# Patient Record
Sex: Male | Born: 1935 | Race: White | Hispanic: No | Smoking: Never smoker
Health system: Southern US, Community
[De-identification: ages and names within clinical notes are randomized; demographics above are authoritative.]

## PROBLEM LIST (undated history)

## (undated) DIAGNOSIS — E079 Disorder of thyroid, unspecified: Secondary | ICD-10-CM

## (undated) DIAGNOSIS — E119 Type 2 diabetes mellitus without complications: Secondary | ICD-10-CM

## (undated) DIAGNOSIS — E039 Hypothyroidism, unspecified: Secondary | ICD-10-CM

## (undated) DIAGNOSIS — E785 Hyperlipidemia, unspecified: Secondary | ICD-10-CM

## (undated) DIAGNOSIS — I10 Essential (primary) hypertension: Secondary | ICD-10-CM

## (undated) DIAGNOSIS — I350 Nonrheumatic aortic (valve) stenosis: Secondary | ICD-10-CM

## (undated) DIAGNOSIS — I2510 Atherosclerotic heart disease of native coronary artery without angina pectoris: Secondary | ICD-10-CM

## (undated) DIAGNOSIS — G459 Transient cerebral ischemic attack, unspecified: Secondary | ICD-10-CM

## (undated) HISTORY — PX: HERNIA REPAIR: SHX51

## (undated) HISTORY — DX: Hypothyroidism, unspecified: E03.9

## (undated) HISTORY — PX: EYE SURGERY: SHX253

## (undated) HISTORY — PX: APPENDECTOMY: SHX54

## (undated) HISTORY — PX: KNEE SURGERY: SHX244

## (undated) HISTORY — DX: Essential (primary) hypertension: I10

## (undated) HISTORY — DX: Transient cerebral ischemic attack, unspecified: G45.9

## (undated) HISTORY — DX: Nonrheumatic aortic (valve) stenosis: I35.0

## (undated) HISTORY — DX: Hyperlipidemia, unspecified: E78.5

## (undated) HISTORY — DX: Disorder of thyroid, unspecified: E07.9

## (undated) HISTORY — PX: COLONOSCOPY: SHX174

## (undated) HISTORY — DX: Atherosclerotic heart disease of native coronary artery without angina pectoris: I25.10

## (undated) HISTORY — PX: OTHER SURGICAL HISTORY: SHX169

## (undated) HISTORY — DX: Type 2 diabetes mellitus without complications: E11.9

---

## 2001-01-22 ENCOUNTER — Encounter: Payer: Self-pay | Admitting: Cardiology

## 2001-01-22 ENCOUNTER — Ambulatory Visit (HOSPITAL_COMMUNITY): Admission: RE | Admit: 2001-01-22 | Discharge: 2001-01-23 | Payer: Self-pay | Admitting: Cardiology

## 2007-01-24 HISTORY — PX: CORONARY ANGIOPLASTY: SHX604

## 2007-04-01 ENCOUNTER — Ambulatory Visit: Payer: Self-pay | Admitting: Urology

## 2007-04-07 ENCOUNTER — Ambulatory Visit: Payer: Self-pay | Admitting: Urology

## 2007-05-05 ENCOUNTER — Ambulatory Visit: Payer: Self-pay

## 2007-05-07 ENCOUNTER — Encounter: Payer: Self-pay | Admitting: Internal Medicine

## 2007-06-05 ENCOUNTER — Encounter: Payer: Self-pay | Admitting: Internal Medicine

## 2007-06-21 ENCOUNTER — Emergency Department: Payer: Self-pay | Admitting: Internal Medicine

## 2007-06-30 ENCOUNTER — Ambulatory Visit: Payer: Self-pay | Admitting: General Practice

## 2007-07-11 ENCOUNTER — Ambulatory Visit: Payer: Self-pay | Admitting: General Practice

## 2007-08-06 ENCOUNTER — Emergency Department: Payer: Self-pay | Admitting: Emergency Medicine

## 2007-08-12 ENCOUNTER — Ambulatory Visit: Payer: Self-pay | Admitting: Urology

## 2007-08-12 ENCOUNTER — Other Ambulatory Visit: Payer: Self-pay

## 2007-08-14 ENCOUNTER — Ambulatory Visit: Payer: Self-pay | Admitting: Urology

## 2007-08-19 ENCOUNTER — Other Ambulatory Visit: Payer: Self-pay

## 2007-08-19 ENCOUNTER — Emergency Department: Payer: Self-pay | Admitting: Internal Medicine

## 2007-11-17 ENCOUNTER — Ambulatory Visit: Payer: Self-pay | Admitting: Urology

## 2007-11-17 ENCOUNTER — Other Ambulatory Visit: Payer: Self-pay

## 2007-11-20 ENCOUNTER — Ambulatory Visit: Payer: Self-pay | Admitting: Urology

## 2008-02-19 ENCOUNTER — Encounter: Payer: Self-pay | Admitting: Neurology

## 2008-06-04 HISTORY — PX: CARDIAC CATHETERIZATION: SHX172

## 2008-09-29 ENCOUNTER — Ambulatory Visit (HOSPITAL_COMMUNITY): Admission: RE | Admit: 2008-09-29 | Discharge: 2008-09-29 | Payer: Self-pay | Admitting: Cardiovascular Disease

## 2009-07-28 ENCOUNTER — Ambulatory Visit: Payer: Self-pay | Admitting: Family Medicine

## 2010-09-13 LAB — POCT I-STAT 3, ART BLOOD GAS (G3+)
Bicarbonate: 24.8 mEq/L — ABNORMAL HIGH (ref 20.0–24.0)
O2 Saturation: 85 %
TCO2: 26 mmol/L (ref 0–100)
pCO2 arterial: 38.6 mmHg (ref 35.0–45.0)
pH, Arterial: 7.416 (ref 7.350–7.450)
pO2, Arterial: 50 mmHg — ABNORMAL LOW (ref 80.0–100.0)

## 2010-09-13 LAB — POCT I-STAT 3, VENOUS BLOOD GAS (G3P V)
Acid-base deficit: 1 mmol/L (ref 0.0–2.0)
Bicarbonate: 24.6 mEq/L — ABNORMAL HIGH (ref 20.0–24.0)
O2 Saturation: 52 %
TCO2: 26 mmol/L (ref 0–100)
pCO2, Ven: 43.2 mmHg — ABNORMAL LOW (ref 45.0–50.0)
pH, Ven: 7.363 — ABNORMAL HIGH (ref 7.250–7.300)
pO2, Ven: 29 mmHg — CL (ref 30.0–45.0)

## 2010-09-13 LAB — PROTIME-INR
INR: 1.1 (ref 0.00–1.49)
Prothrombin Time: 14.6 seconds (ref 11.6–15.2)

## 2010-09-13 LAB — GLUCOSE, CAPILLARY
Glucose-Capillary: 118 mg/dL — ABNORMAL HIGH (ref 70–99)
Glucose-Capillary: 130 mg/dL — ABNORMAL HIGH (ref 70–99)

## 2010-10-17 NOTE — Cardiovascular Report (Signed)
NAMEWITTEN, CERTAIN               ACCOUNT NO.:  [id number]   MEDICAL RECORD NO.:  [id number]          PATIENT TYPE:  OIB   LOCATION:  2899                         FACILITY:  [facility]   PHYSICIAN:  Antonieta Iba, MD   DATE OF BIRTH:  1924-08-09   DATE OF PROCEDURE:  DATE OF DISCHARGE:  09/29/2008                            CARDIAC CATHETERIZATION   REFERRING PHYSICIAN:  Madaline Savage, MD   REASON FOR PROCEDURE:  Mr. Frizell is a very pleasant 77 year old  gentleman with a history of coronary artery disease, stent placed to his  mid circumflex in 2002 who presents with worsening shortness of breath.   Risks and benefits were discussed with the patient and consent was  obtained.  The patient was prepped and draped in usual sterile fashion  and a modified Seldinger technique was used to engage the right femoral  artery and a 5-French catheter and sheath was placed.  A 7-French  catheter and sheath were inserted into the right femoral vein.  Swan  catheter was used to obtain right heart pressures, wedge, and pulmonary  artery saturations and cardiac output and index were determined.  After  right heart pressures, Judkins JR-5 catheters were used to engage the  left main and ostium of the RCA respectively.  Hand injection of  contrast was used to visualize the coronary anatomy and cinematography  was recorded.  A pigtail catheter was used to cross the aortic valve and  LV gram also recorded with pullback recording aortic valve gradient.  No  complications were noted during this case.  Less than 100 mL of contrast  was used.   Coronary anatomy;  Left main; large size vessel that bifurcates into the LAD and left  circumflex.  There is no significant disease noted.   LAD; moderate-to-large size vessel that extends to the apex.  There is  one moderate-sized diagonal branch.  The LAD has a long diffuse region  of mild to moderate disease estimated at 30-40% after the diagonal  takeoff  extending to the mid-LAD.  The diagonal branch has no  significant disease.   Left circumflex; left circumflex is a moderate-sized vessel with several  obtuse marginal branches.  The stent in the mid left circumflex is  patent.  There is a 50-60% lesion noted in the ostial OM1.  Otherwise,  there is no significant disease noted.   Right coronary artery; dominant vessel that is large in size that  bifurcates distally with an PD and PL branch.  No significant disease  noted.   Ejection fraction/LV gram estimated at 45% with mild global hypokinesis.   Right heart pressures; right atrial pressure with mean of 2, RV pressure  22/3 with a mean of 5, PA pressures 24/6 with a mean of 14, wedge  pressure of 7, aortic root pressure of 110/60, LV pressure of 109/3 with  an EDP of 7.  The aortic saturation of 85% and PA saturation of 52%.   In summary; mild to moderate mid LAD disease estimated at 30-40% in a  long diffuse region.  No in-stent restenosis of the stent  in the left  circumflex.  There is moderate disease of the ostial OM1.  Ejection  fraction of 45% with mild global hypokinesis which is unchanged from  2002.  Normal right heart pressures.  Overall, there is only mild  changes from previous catheterization in 2002.  We have suggested  medical management of his coronary artery  disease and we would refer to Dr. Elsie Lincoln for management of this.  Etiology of his shortness of breath is uncertain though likely not due  to elevated right heart pressures or to worsening of his coronary artery  disease.  Again, we will defer to Dr. Elsie Lincoln for further evaluation.      Antonieta Iba, MD  Electronically Signed     TJG/MEDQ  D:  09/29/2008  T:  09/29/2008  Job:  [id number]

## 2010-10-20 NOTE — Cardiovascular Report (Signed)
[name]. [facility]  Patient:    [name], [name] Visit Number: [id number] MRN: [id number]          Service Type: CAT Location: 6500 [id number] Attending Physician:  Ophelia Shoulder Proc. Date: 01/22/01 Adm. Date:  01/22/2001   CC:         Cardiac Catheterization Laboratory  Dr. Shella Spearing, [facility]   Cardiac Catheterization  PROCEDURES PERFORMED: 1. Selective coronary angiography by Judkins technique. 2. Retrograde left heart catheterization. 3. Left ventricular angiography. 4. Abdominal aortography. 5. Percutaneous coronary stenting of the mid left circumflex    coronary artery.  COMPLICATIONS:  None.  ENTRY SITE:  Right femoral.  DYE USED:  Omnipaque.  PATIENT PROFILE:  The patient is a 77 year old, active, gentleman who has been recently having episodes of chest discomfort awakening him from sleep at night.  He has also had some pains in his neck.  He was seen in our office recently with these complaints with a normal ECG and a subsequent Cardiolite stress test showed lateral wall ischemia but no evidence of scar.  Dynamic imaging did not allow for quantitation of his ejection fraction.  Todays procedure was performed on an outpatient basis electively.  RESULTS:  PRESSURES:  The left ventricular pressure was 145/15, central aortic pressure 145/82, mean of 105.  No aortic valve gradient by pullback technique.  ANGIOGRAPHIC RESULTS:  The left main coronary artery was normal.  The left circumflex coronary artery gave rise to a large obtuse marginal branch which was normal.  Shortly thereafter, the circumflex in its midportion contained an eccentric stenosis about 10 mm long and 90-95% severe.  The circumflex terminated as a bifurcating second obtuse marginal branch.  The LAD gave rise to a diagonal branch and neither the LAD nor its diagonal branch were diseased.  The right coronary artery was a large dominant vessel that  contained luminal irregularities but no significant lesions.  There was a faint collateral vessel extending from the posterolateral vessel to the distal circumflex.  The left ventricle unfortunately showed a lot of ventricular ectopy when angiogram was performed.  Estimated ejection fraction was 50%.  There were no significant wall motion abnormalities and no mitral regurgitation.  PERCUTANEOUS INTERVENTION:  This was performed from the right leg through the 6 French indwelling arterial sheath left from diagnostic cardiac catheterization.  The patient was systemically heparinized with 5000 units of heparin.  A subsequently ACT was approximately 265.  He was also given a double bolus of Integrilin.  The guide catheter used was a 6 [place] CLS-4 guide catheter.  The wire was a short Patriot wire.  The first interventional device used was a 2.75 x 15 mm Cutting Balloon which was difficult to cross the lesion with.  Once it crossed, the balloon was inflated to 6 atmospheres of pressure and a good result was obtained but there was felt to be an eccentric residual plaque and possibly some dissection. I then used a 3.0 x 15 mm Maverick balloon inflated to 8 atmospheres of pressure.  This smoothed the dissection but did not eradicate it.  I then chose a 2.75 x 13 mm Guidant Penta stent and inflated it to 14 atmospheres of pressure corresponding to anticipated luminal diameter of 3.08.  TIMI-3 class flow was preserved and the lesion was reduced from 90-95% down to 0% residual.  The case was tolerated well.  ABDOMINAL AORTOGRAM:  Abdominal aortography had shown a smooth aorta, normal iliacs and normal renal arteries.  FINAL  DIAGNOSES: 1. Single-vessel coronary artery disease of the mid left circumflex    coronary artery. 2. Successful percutaneous intervention with balloon angioplasty and    stenting with reduction of a 95% lesion down to 0% residual. 3. Normal left ventricular function. 4.  Normal renal artery stenosis, abdominal aorta and common iliacs.  PLAN:  The patient will continue on Integrilin overnight and be discharged tomorrow if enzymes are negative.  The patients lipid profile at baseline shows an LDL of 65, a total cholesterol of 140 and an HDL in the high 30s. Attending Physician:  Ophelia Shoulder DD:  01/22/01 TD:  01/23/01 Job: [id number]

## 2010-10-20 NOTE — Discharge Summary (Signed)
[place]. [facility]  Patient:    JANOS, SHAMPINE Visit Number: [id number] MRN: [id number]          Service Type: CAT Location: 6500 6523 01 Attending Physician:  Ophelia Shoulder Dictated by:   Tara Jernejcic, P.A. Adm. Date:  01/22/2001 Disc. Date: 01/23/2001   CC:         Lavetta Nielsen, M.D.   Discharge Summary  DATE OF BIRTH:  06-May-1925  ADMITTING DIAGNOSES: 1. Abnormal Cardiolite with lateral wall ischemia. 2. Hypertension. 3. Hyperlipidemia. 4. No prior history of coronary artery disease.  DISCHARGE DIAGNOSES: 1. Abnormal Cardiolite, status post cardiac catheterization electively    revealing single-vessel circumflex disease, status post PCI. 2. Hypertension. 3. Hyperlipidemia.  HISTORY OF PRESENT ILLNESS:  Mr. Quinley is a pleasant, 77 year old male patient of Madaline Savage, M.D. and Dr. Jarrett Soho, who has been followed for hypertension and hyperlipidemia in the past.  He has no prior history of coronary artery disease.  Recently the patient has been complaining of nocturnal chest pain which is not provoked by exertion and is atypical in nature.  EKG done in the office December 23, 2000, showed normal sinus rhythm with nonspecific ST-T wave abnormalities. Based on the patients age, sex, hyperlipidemia, and hypertension, it was felt that he had multiple cardiac risk factors in the setting of atypical chest pain and a Persantine Cardiolite was ordered.  This was performed January 10, 2001, and revealed evidence of possible inferior wall scar towards the base with mild lateral wall ischemia noted primarily on horizontal long axis images.  Gating was not performed.  Because this patient has no prior history of coronary artery disease and has multiple risk factors, it was felt that cardiac catheterization was warranted to give definitive diagnosis.  PROCEDURES:  Cardiac catheterization with intervention January 22, 2001,  by Madaline Savage, M.D.  Single-vessel circumflex disease.  COMPLICATIONS:  None.  CONSULTATIONS:  None.  HOSPITAL COURSE:  Mr. Black was admitted to [facility] on January 22, 2001, for elective cardiac catheterization.  Preprocedure laboratory studies were within normal limits (BUN 18, creatinine 1.0).  Of note his fasting total cholesterol on January 21, 2001, showed a total cholesterol 143, triglycerides 312, HDL 32, and LDL 48.  The patient was screened for placement of study, but declined to participate. The patient was taken to cardiac catheterization lab by Madaline Savage, M.D.  Cardiac catheterization revealed a normal left main, normal LAD, circumflex with 90% lesion in its proximal midportion after the OM-1.  OM-1 and OM-2 were widely patent.  RCA was okay and dominant.  Ejection fraction 50%.  Dr. Elsie Lincoln proceeded with PENTA stenting of the circumflex lesion reducing 90% to 0% stenosis.  The patient was treated with integrilin and heparin as well as Plavix.  The patient tolerated the procedure well and had no problems with sheath pull.  On the morning after the procedure the patients potassium was low at 3.3 and this was repleted.  Cardiac enzymes nonspecific with a mild troponin lead of 0.08, but felt to be unlikely ischemic, with CK of 57 and MB 1.0.  THe patient was felt to be able to be discharged home on January 23, 2001.  He will be treated with aggressive medical therapy including statin and ACE inhibitor medications, Plavix, and aspirin.  DISCHARGE MEDICATIONS: 1. Lipitor 10 mg q.h.s. 2. Plavix 75 mg q.d. for a month. 3. Zestril 5 mg q.d. 4. Detrol 4 mg at  night. 5. Hytrin 5 mg. 6. Hydrochlorothiazide 25 mg q.d. 7. Niacin 500 mg 8. Enteric coated aspirin 325 mg q.d. 9. Nitroglycerin as needed for chest pain.  ACTIVITY:  No strenuous activity, lifting no more than five pounds, driving, or sexual activity for two days.  DIET:  The patient  may follow a low-fat/low-cholesterol/low-salt diet and needs to reduce _____ products and carbohydrates in his diet.  WOUND CARE:  He is to keep his Perclose bandage on for two days and then may replace this with regular Band-Aids.  He is not to soak in the tub or swim for a week.  INSTRUCTIONS:  He is ask to call the office with any problems or questions.  FOLLOWUP:  Followup scheduled with Dr. Reino Kent nurse practitioner, Vernona Rieger, for February 18, 2001, at 10 oclock. Dictated by:   Marya Fossa, P.A. Attending Physician:  Ophelia Shoulder DD:  01/23/01 TD:  01/24/01 Job: [id number]

## 2010-11-29 ENCOUNTER — Encounter: Payer: Self-pay | Admitting: Neurology

## 2010-12-03 ENCOUNTER — Encounter: Payer: Self-pay | Admitting: Neurology

## 2011-01-03 ENCOUNTER — Encounter: Payer: Self-pay | Admitting: Neurology

## 2011-06-30 ENCOUNTER — Emergency Department: Payer: Self-pay | Admitting: Emergency Medicine

## 2011-07-17 ENCOUNTER — Ambulatory Visit: Payer: Self-pay | Admitting: Cardiovascular Disease

## 2012-01-11 ENCOUNTER — Ambulatory Visit: Payer: Self-pay | Admitting: Unknown Physician Specialty

## 2012-09-12 ENCOUNTER — Encounter: Payer: Self-pay | Admitting: Neurology

## 2012-09-26 ENCOUNTER — Ambulatory Visit: Payer: Self-pay | Admitting: Cardiovascular Disease

## 2012-10-02 ENCOUNTER — Encounter: Payer: Self-pay | Admitting: Neurology

## 2012-10-17 ENCOUNTER — Other Ambulatory Visit: Payer: Self-pay | Admitting: *Deleted

## 2012-10-17 MED ORDER — METOLAZONE 2.5 MG PO TABS: 2.5000 mg | ORAL_TABLET | Freq: Every day | ORAL | Status: DC

## 2012-10-21 ENCOUNTER — Other Ambulatory Visit: Payer: Self-pay | Admitting: Cardiovascular Disease

## 2012-10-21 LAB — COMPREHENSIVE METABOLIC PANEL
ALT: 9 U/L (ref 0–53)
AST: 15 U/L (ref 0–37)
Albumin: 3.6 g/dL (ref 3.5–5.2)
Alkaline Phosphatase: 108 U/L (ref 39–117)
Glucose, Bld: 130 mg/dL — ABNORMAL HIGH (ref 70–99)
Potassium: 4.3 mEq/L (ref 3.5–5.3)
Sodium: 143 mEq/L (ref 135–145)
Total Bilirubin: 0.6 mg/dL (ref 0.3–1.2)
Total Protein: 5.6 g/dL — ABNORMAL LOW (ref 6.0–8.3)

## 2012-10-29 ENCOUNTER — Encounter: Payer: Self-pay | Admitting: Cardiovascular Disease

## 2012-11-02 ENCOUNTER — Encounter: Payer: Self-pay | Admitting: Neurology

## 2012-11-05 ENCOUNTER — Other Ambulatory Visit: Payer: Self-pay | Admitting: Pharmacist Clinician (PhC)/ Clinical Pharmacy Specialist

## 2012-11-05 MED ORDER — FUROSEMIDE 20 MG PO TABS: 40.0000 mg | ORAL_TABLET | Freq: Every day | ORAL | Status: DC

## 2012-11-19 ENCOUNTER — Telehealth: Payer: Self-pay | Admitting: Cardiovascular Disease

## 2012-11-19 NOTE — Telephone Encounter (Signed)
Returned call.  Left message to call back before 4pm.  

## 2012-11-19 NOTE — Telephone Encounter (Signed)
Chart# [id number]

## 2012-11-19 NOTE — Telephone Encounter (Signed)
Dr. Alanda Amass aware of pt. Issues and agrees with plan

## 2012-11-19 NOTE — Telephone Encounter (Signed)
Returned call.  Pt stated his BP was 99/53 at therapy and he was concerned.  Stated when he woke up this morning he couldn't see out of his R eye at all.  Stated after a while it got straight, but it's still a little foggy.  Stated he can see perfect out of his L eye and his R eye is foggy.  Pt does not have a way to check his BP at home.  Stated his BP runs 120s/60-80.  Denied HA or dizziness this morning when he woke up this morning.  Stated last eye exam was 2 months ago and he is supposed to go back on July 30th.  Pt denied checking BG today and stated he has been eating sweets.    Advice: Check fasting BG every morning   Contact eye doctor and inform of vision changes   Dr. Alanda Amass will be notified as well  Pt verbalized understanding and agreed w/ plan.  Pt stated he will go up to the pharmacy to check his BP and call back in the morning.  Pt understands Dr. Alanda Amass will be notified as well.  Message forwarded to [facility]. Berlinda Last, LPN to discuss w/ Dr. Alanda Amass.  This note and paper chart#  placed on Dr. Kandis Cocking cart.

## 2012-11-19 NOTE — Telephone Encounter (Signed)
Woke up this morning -could not see-went to therapy and they said his BP was 99/53-told him to call his Cardiologist-please call him after 1:00 if you cant call befoer 11:45!

## 2012-11-19 NOTE — Telephone Encounter (Signed)
Returning your call. °

## 2012-11-20 ENCOUNTER — Ambulatory Visit: Payer: Self-pay | Admitting: Ophthalmology

## 2012-11-20 LAB — CREATININE, SERUM
Creatinine: 1.32 mg/dL — ABNORMAL HIGH (ref 0.60–1.30)
EGFR (African American): 57 — ABNORMAL LOW

## 2012-12-02 ENCOUNTER — Encounter: Payer: Self-pay | Admitting: Neurology

## 2013-01-02 ENCOUNTER — Encounter: Payer: Self-pay | Admitting: Neurology

## 2013-02-02 ENCOUNTER — Encounter: Payer: Self-pay | Admitting: Neurology

## 2013-02-12 DIAGNOSIS — N138 Other obstructive and reflux uropathy: Secondary | ICD-10-CM | POA: Insufficient documentation

## 2013-02-12 DIAGNOSIS — R350 Frequency of micturition: Secondary | ICD-10-CM | POA: Insufficient documentation

## 2013-03-04 ENCOUNTER — Encounter: Payer: Self-pay | Admitting: Neurology

## 2013-03-17 ENCOUNTER — Telehealth: Payer: Self-pay | Admitting: Cardiovascular Disease

## 2013-03-17 NOTE — Telephone Encounter (Signed)
Message forwarded to J.C. Wildman, LPN.  

## 2013-03-17 NOTE — Telephone Encounter (Signed)
Pt called and informed to call schedulers for appt. With either Dr. Rennis Golden or Dr. Herbie Baltimore. Pt. Stated understanding of instructions

## 2013-03-17 NOTE — Telephone Encounter (Signed)
Please have [name] to call him.Just need to talk to him.

## 2013-03-27 ENCOUNTER — Encounter: Payer: Self-pay | Admitting: Cardiovascular Disease

## 2013-03-27 ENCOUNTER — Ambulatory Visit (INDEPENDENT_AMBULATORY_CARE_PROVIDER_SITE_OTHER): Payer: Medicare Other | Admitting: Cardiovascular Disease

## 2013-03-27 VITALS — BP 122/70 | HR 63 | Ht 71.0 in | Wt 201.2 lb

## 2013-03-27 DIAGNOSIS — I5032 Chronic diastolic (congestive) heart failure: Secondary | ICD-10-CM

## 2013-03-27 DIAGNOSIS — I4891 Unspecified atrial fibrillation: Secondary | ICD-10-CM

## 2013-03-27 DIAGNOSIS — E785 Hyperlipidemia, unspecified: Secondary | ICD-10-CM | POA: Insufficient documentation

## 2013-03-27 DIAGNOSIS — I509 Heart failure, unspecified: Secondary | ICD-10-CM

## 2013-03-27 DIAGNOSIS — R600 Localized edema: Secondary | ICD-10-CM | POA: Insufficient documentation

## 2013-03-27 DIAGNOSIS — K5900 Constipation, unspecified: Secondary | ICD-10-CM

## 2013-03-27 DIAGNOSIS — I350 Nonrheumatic aortic (valve) stenosis: Secondary | ICD-10-CM | POA: Insufficient documentation

## 2013-03-27 DIAGNOSIS — R609 Edema, unspecified: Secondary | ICD-10-CM

## 2013-03-27 DIAGNOSIS — I10 Essential (primary) hypertension: Secondary | ICD-10-CM | POA: Insufficient documentation

## 2013-03-27 DIAGNOSIS — I2510 Atherosclerotic heart disease of native coronary artery without angina pectoris: Secondary | ICD-10-CM | POA: Insufficient documentation

## 2013-03-27 DIAGNOSIS — I359 Nonrheumatic aortic valve disorder, unspecified: Secondary | ICD-10-CM

## 2013-03-27 NOTE — Patient Instructions (Signed)
You are doing well. No medication changes were made.  We will try to obtain the records from [place] We will also obtain labs from [place]  Please call us if you have new issues that need to be addressed before your next appt.  Your physician wants you to follow-up in: 6 months.  You will receive a reminder letter in the mail two months in advance. If you don't receive a letter, please call our office to schedule the follow-up appointment.

## 2013-03-27 NOTE — Assessment & Plan Note (Signed)
We have suggested he start MiraLAX, and milk of magnesia

## 2013-03-27 NOTE — Assessment & Plan Note (Addendum)
Chronic bilateral leg edema, likely a component of venous insufficiency. Appears relatively euvolemic today. If BUN and creatinine started climbing, would decrease the Lasix in half.

## 2013-03-27 NOTE — Assessment & Plan Note (Signed)
Blood pressure is well controlled on today's visit. No changes made to the medications. 

## 2013-03-27 NOTE — Assessment & Plan Note (Signed)
Currently taking Lasix daily. We'll try to obtain most recent lab work from yesterday

## 2013-03-27 NOTE — Progress Notes (Signed)
Patient ID: [name], male    DOB: 10-05-1924, 79 y.o.   MRN: [id number]  HPI Comments: Mr. Oelkers is an 79 year old gentleman with history of CAD, PCI of the circumflex in 2002, catheterization in 2010 showing patent circumflex and noncritical CAD with normal right-sided pressures, ejection fraction 50-55% April 2014 with mild aortic valve stenosis, gradient 2.26 m/s, chronic lower extremity swelling that per the notes has had a history of atrial fibrillation and is on pradaxa 150 mg daily (details unavailable) , who presents for new patient evaluation and to establish care in the [place] office. He has a history of Parkinson's  He reports that overall he is doing well. He denies any significant shortness of breath or chest pain. He takes Lasix 40 mg every morning for leg edema.  He is bothered by frequency of urination and would like to decrease the dose of his Lasix. He reports having good energy, sleeps well, is active at baseline. Gait is poor secondary to Parkinson's.  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 amiodarone (PACERONE) 200 MG tablet Take 100 mg by mouth 2 (two) times daily.      . beta carotene w/minerals (OCUVITE) tablet Take 1 tablet by mouth daily.      . carbidopa-levodopa  (SINEMET IR) 25-100 MG per tablet Take 1 tablet by mouth 4 (four) times daily.      . Carbidopa-Levodopa ER (SINEMET CR) 25-100 MG tablet controlled release Take 1 tablet by mouth 2 (two) times daily.      . dabigatran (PRADAXA) 150 MG CAPS capsule Take 150 mg by mouth every morning.      . finasteride (PROSCAR) 5 MG tablet Take 5 mg by mouth daily.      . furosemide (LASIX) 20 MG tablet Take 2 tablets (40 mg total) by mouth daily.  60 tablet  6  . levothyroxine (SYNTHROID, LEVOTHROID) 125 MCG tablet Take 125 mcg by mouth daily before breakfast.      . meloxicam (MOBIC) 7.5 MG tablet Take 7.5 mg by mouth daily.      . metolazone (ZAROXOLYN) 2.5 MG tablet Take 2.5 mg by mouth once a week. On Friday      . mirtazapine (REMERON) 15 MG tablet Take 7.5 mg by mouth at bedtime.      Marland Kitchen omeprazole (PRILOSEC) 20 MG capsule Take 20 mg by mouth daily.      . potassium chloride (K-DUR) 10 MEQ tablet Take 10 mEq by mouth every morning.      . pramipexole (MIRAPEX) 1 MG tablet Take 1/4 tablet four times daily.      Marland Kitchen senna-docusate (SENNALAX-S) 8.6-50 MG per tablet Take 1 tablet by mouth daily.      . sitaGLIPtin (JANUVIA) 100 MG tablet Take 100 mg  by mouth daily.      . tamsulosin (FLOMAX) 0.4 MG CAPS capsule Take 0.4 mg by mouth daily after supper.        Review of Systems  Constitutional: Negative.   HENT: Negative.   Eyes: Negative.   Respiratory: Negative.   Cardiovascular: Positive for leg swelling.  Gastrointestinal: Negative.   Endocrine: Negative.   Musculoskeletal: Negative.   Skin: Negative.   Allergic/Immunologic: Negative.   Neurological: Negative.   Hematological: Negative.   Psychiatric/Behavioral: Negative.   All other systems reviewed and are negative.    BP 122/70  Pulse 63  Ht 5\' 11"  (1.803 m)  Wt 201 lb 4 oz (91.286 kg)  BMI 28.08 kg/m2  Physical Exam  Nursing note and vitals reviewed. Constitutional: He is oriented to person, place, and time. He appears well-developed  and well-nourished.  HENT:  Head: Normocephalic.  Nose: Nose normal.  Mouth/Throat: Oropharynx is clear and moist.  Eyes: Conjunctivae are normal. Pupils are equal, round, and reactive to light.  Neck: Normal range of motion. Neck supple. No JVD present.  Cardiovascular: Normal rate, regular rhythm, S1 normal, S2 normal, normal heart sounds and intact distal pulses.  Exam reveals no gallop and no friction rub.   No murmur heard. Pulmonary/Chest: Effort normal and breath sounds normal. No respiratory distress. He has no wheezes. He has no rales. He exhibits no tenderness.  Abdominal: Soft. Bowel sounds are normal. He exhibits no distension. There is no tenderness.  Musculoskeletal: Normal range of motion. He exhibits no edema and no tenderness.  Lymphadenopathy:    He has no cervical adenopathy.  Neurological: He is alert and oriented to person, place, and time. Coordination normal.  Skin: Skin is warm and dry. No rash noted. No erythema.  Psychiatric: He has a normal mood and affect. His behavior is normal. Judgment and thought content normal.      Assessment and Plan

## 2013-03-27 NOTE — Assessment & Plan Note (Signed)
No clear documentation for EKGs noted today showing atrial fibrillation. We will try to obtain old records, old EKGs for more details. If no recent atrial fibrillation, we could hold the pradaxa and start aspirin

## 2013-03-27 NOTE — Assessment & Plan Note (Signed)
Mild aortic valve stenosis by echocardiogram April 2014.

## 2013-03-27 NOTE — Assessment & Plan Note (Signed)
Currently with no symptoms of angina. No further workup at this time. Continue current medication regimen. 

## 2013-03-27 NOTE — Assessment & Plan Note (Signed)
We'll try to obtain his most recent lipid panel for our records. 

## 2013-03-30 ENCOUNTER — Encounter: Payer: Self-pay | Admitting: *Deleted

## 2013-04-04 ENCOUNTER — Encounter: Payer: Self-pay | Admitting: Neurology

## 2013-05-04 ENCOUNTER — Ambulatory Visit (INDEPENDENT_AMBULATORY_CARE_PROVIDER_SITE_OTHER): Payer: Medicare Other | Admitting: Podiatry

## 2013-05-04 ENCOUNTER — Encounter: Payer: Self-pay | Admitting: Podiatry

## 2013-05-04 VITALS — BP 140/84 | HR 65 | Resp 16 | Ht 73.0 in | Wt 192.0 lb

## 2013-05-04 DIAGNOSIS — M7752 Other enthesopathy of left foot: Secondary | ICD-10-CM

## 2013-05-04 DIAGNOSIS — M7750 Other enthesopathy of unspecified foot: Secondary | ICD-10-CM

## 2013-05-04 NOTE — Progress Notes (Signed)
[name] presents today for chief complaint of painful toenails bilaterally his also complaining of painful toe fourth left. He denies any trauma and states it is painful on the bottom.  Objective: Pulses remain palpable left lower extremity. Nails are thick yellow dystrophic clinically mycotic. He has painful range of motion and pain on palpation of the fourth metatarsophalangeal joint of the left foot.  Assessment: Capsulitis fourth metatarsophalangeal joint left. Pain in limb secondary to onychomycosis 1 through 5 bilateral.  Plan: Debridement of nails 1 through 5 bilateral. Also injected Kenalog and local anesthetic to the point of maximal tenderness in the dorsal aspect of the left foot after sterile Betadine skin prep.

## 2013-05-11 ENCOUNTER — Ambulatory Visit (INDEPENDENT_AMBULATORY_CARE_PROVIDER_SITE_OTHER): Payer: Medicare Other | Admitting: Podiatry

## 2013-05-11 ENCOUNTER — Encounter: Payer: Self-pay | Admitting: Podiatry

## 2013-05-11 VITALS — BP 158/92 | HR 84 | Resp 16 | Ht 73.0 in | Wt 189.0 lb

## 2013-05-11 DIAGNOSIS — L02619 Cutaneous abscess of unspecified foot: Secondary | ICD-10-CM

## 2013-05-11 MED ORDER — CEPHALEXIN 500 MG PO CAPS: 500.0000 mg | ORAL_CAPSULE | Freq: Three times a day (TID) | ORAL | Status: DC

## 2013-05-11 NOTE — Progress Notes (Signed)
[name] presents today with a chief complaint of pain to the plantar aspect of his left foot. He denies any trauma states has been sore for the past few days.  Objective: Pulses remain palpable left lower extremity. Plantar aspect of the fourth metatarsophalangeal joint, which is prominent, demonstrates a superficial skin breakdown with a small amount of bloody fluid collection.  Assessment: Abscess plantar aspect left foot.  Plan: After Betadine skin prep I incised and drained the abscess. I will get him started on Keflex 500 mg 1 by mouth 3 times a day for 10 days placed padding. He is to soak this in Epsom salts in warm water and dressing daily. I will followup with him in 2 weeks.

## 2013-05-25 ENCOUNTER — Encounter: Payer: Self-pay | Admitting: Podiatry

## 2013-05-25 ENCOUNTER — Ambulatory Visit (INDEPENDENT_AMBULATORY_CARE_PROVIDER_SITE_OTHER): Payer: Medicare Other | Admitting: Podiatry

## 2013-05-25 VITALS — BP 130/64 | HR 67 | Resp 16

## 2013-05-25 DIAGNOSIS — L97509 Non-pressure chronic ulcer of other part of unspecified foot with unspecified severity: Secondary | ICD-10-CM

## 2013-05-25 NOTE — Progress Notes (Signed)
   Subjective:    Patient ID: [name], male    DOB: 1925/03/18, 79 y.o.   MRN: [id number]  HPI Comments: It hurts if i do not  Keep a pad on it      Review of Systems     Objective:   Physical Exam: Pulses remain palpable left foot. At this point there is no erythema edema cellulitis drainage or odor. Ulceration sub-fourth metatarsal phalangeal joint of the left foot is very superficial and without signs of infection at this point.          Assessment & Plan:  Assessment: Well-healing ulceration sub-fourth metatarsal phalangeal joint left foot.  Plan: Continue placed the aperture pads secondary to the plantar flexed metatarsal and continue conservative therapies for wound healing.

## 2013-05-26 ENCOUNTER — Other Ambulatory Visit: Payer: Self-pay | Admitting: *Deleted

## 2013-05-26 NOTE — Telephone Encounter (Signed)
Levothyroxine refill refused - defer to PCP 

## 2013-06-01 ENCOUNTER — Other Ambulatory Visit: Payer: Self-pay | Admitting: *Deleted

## 2013-06-01 ENCOUNTER — Emergency Department: Payer: Self-pay | Admitting: Emergency Medicine

## 2013-06-01 NOTE — Telephone Encounter (Signed)
Refill for levothyroxine refused - not appropriate - sees Julien Nordmann, MD

## 2013-06-05 ENCOUNTER — Telehealth: Payer: Self-pay

## 2013-06-05 NOTE — Telephone Encounter (Signed)
Pt has changed insurance companies and needs someone to call regarding refills on his meds. Not sure which ones.

## 2013-06-05 NOTE — Telephone Encounter (Signed)
Spoke with pt and he mentioned that he needed a copy of his medication list to give to [employer]. Pt is aware that I will mail his medication list to him.

## 2013-06-09 ENCOUNTER — Other Ambulatory Visit: Payer: Self-pay | Admitting: *Deleted

## 2013-06-09 NOTE — Telephone Encounter (Signed)
Refill for synthroid refused. Defer to PCP or new cardiologist

## 2013-06-10 ENCOUNTER — Other Ambulatory Visit: Payer: Self-pay | Admitting: *Deleted

## 2013-06-10 NOTE — Telephone Encounter (Signed)
Error

## 2013-06-17 ENCOUNTER — Ambulatory Visit: Payer: Self-pay | Admitting: General Practice

## 2013-06-22 ENCOUNTER — Ambulatory Visit: Payer: Medicare Other | Admitting: Podiatry

## 2013-07-14 ENCOUNTER — Ambulatory Visit: Payer: Self-pay | Admitting: General Practice

## 2013-07-15 ENCOUNTER — Other Ambulatory Visit: Payer: Self-pay | Admitting: *Deleted

## 2013-07-15 MED ORDER — POTASSIUM CHLORIDE ER 10 MEQ PO TBCR: 10.0000 meq | EXTENDED_RELEASE_TABLET | Freq: Every morning | ORAL | Status: DC

## 2013-07-15 NOTE — Telephone Encounter (Signed)
Requested Prescriptions   Signed Prescriptions Disp Refills  . potassium chloride (K-DUR) 10 MEQ tablet 30 tablet 3    Sig: Take 1 tablet (10 mEq total) by mouth every morning.    Authorizing Provider: Antonieta IbaGOLLAN, TIMOTHY J    Ordering User: Kendrick FriesLOPEZ, MARINA C

## 2013-07-17 ENCOUNTER — Ambulatory Visit: Payer: Self-pay | Admitting: General Practice

## 2013-07-18 LAB — BASIC METABOLIC PANEL
ANION GAP: 6 — AB (ref 7–16)
BUN: 21 mg/dL — ABNORMAL HIGH (ref 7–18)
CO2: 28 mmol/L (ref 21–32)
Calcium, Total: 8.2 mg/dL — ABNORMAL LOW (ref 8.5–10.1)
Chloride: 109 mmol/L — ABNORMAL HIGH (ref 98–107)
Creatinine: 1.33 mg/dL — ABNORMAL HIGH (ref 0.60–1.30)
GFR CALC AF AMER: 56 — AB
GFR CALC NON AF AMER: 48 — AB
GLUCOSE: 106 mg/dL — AB (ref 65–99)
Osmolality: 288 (ref 275–301)
POTASSIUM: 3.7 mmol/L (ref 3.5–5.1)
SODIUM: 143 mmol/L (ref 136–145)

## 2013-07-18 LAB — PLATELET COUNT: Platelet: 148 10*3/uL — ABNORMAL LOW (ref 150–440)

## 2013-07-18 LAB — HEMOGLOBIN: HGB: 13.5 g/dL (ref 13.0–18.0)

## 2013-08-03 ENCOUNTER — Ambulatory Visit (INDEPENDENT_AMBULATORY_CARE_PROVIDER_SITE_OTHER): Payer: Commercial Managed Care - HMO | Admitting: Podiatry

## 2013-08-03 VITALS — BP 133/86 | HR 91 | Resp 16 | Ht 71.0 in | Wt 190.0 lb

## 2013-08-03 DIAGNOSIS — L97409 Non-pressure chronic ulcer of unspecified heel and midfoot with unspecified severity: Secondary | ICD-10-CM

## 2013-08-03 DIAGNOSIS — B351 Tinea unguium: Secondary | ICD-10-CM

## 2013-08-03 DIAGNOSIS — M79609 Pain in unspecified limb: Secondary | ICD-10-CM

## 2013-08-03 NOTE — Progress Notes (Signed)
He presents today chief complaint of painful elongated toenails bilaterally it also complaining of a sore area to the posterior aspect of his right heel where he had been put in the hospital for a period of time with knee surgery. He states that he's been utilizing his heel pillow over the last few weeks.  Objective: Vital signs are stable he is alert. Pulses are palpable bilateral. Right heel does demonstrate ecchymosis and bruising to the posterior heel this does appear that more than likely this will become a superficial decubitus ulcer. His nails are thick yellow dystrophic with mycotic.  Assessment: Superficial grade 1 decubitus ulcer right heel. Pain in limb secondary to onychomycosis 1 through 5 bilateral.  Plan: Discussed etiology pathology conservative versus surgical therapies at this point I debrided nails 1 through 5 bilateral is cover service secondary to pain also suggested that he continue to wear the heel pillow are regular basis he understands that is amenable to it I will followup with him in about 3 weeks for evaluation of his right heel.

## 2013-08-24 ENCOUNTER — Ambulatory Visit (INDEPENDENT_AMBULATORY_CARE_PROVIDER_SITE_OTHER): Payer: Commercial Managed Care - HMO | Admitting: Podiatry

## 2013-08-24 VITALS — BP 128/67 | HR 80

## 2013-08-24 DIAGNOSIS — L97409 Non-pressure chronic ulcer of unspecified heel and midfoot with unspecified severity: Secondary | ICD-10-CM

## 2013-08-24 NOTE — Progress Notes (Signed)
He presents today for followup of ulceration to the posterior right heel. He states it is been draining some in his nurse is been coming to the house to dress it.  Objective: Vital signs are stable he is alert and oriented x3. Decubitus ulcer of the right posterior heel does demonstrate some loose scan which I debrided today currently this does not appear to be infectious.  Assessment: Decubitus ulcer slowly healing right heel.  Plan: Debridement of tissue today and redressed with a Silvadene and a dry sterile compressive dressing. He will continue the use of his splint and boot and continue to keep his legs elevated. His nurse will continue daily dressing changes.

## 2013-08-31 ENCOUNTER — Other Ambulatory Visit: Payer: Self-pay | Admitting: Cardiovascular Disease

## 2013-09-14 ENCOUNTER — Ambulatory Visit: Payer: Commercial Managed Care - HMO | Admitting: Podiatry

## 2013-09-21 ENCOUNTER — Ambulatory Visit (INDEPENDENT_AMBULATORY_CARE_PROVIDER_SITE_OTHER): Payer: Commercial Managed Care - HMO | Admitting: Podiatry

## 2013-09-21 VITALS — Resp 16 | Ht 72.0 in | Wt 194.0 lb

## 2013-09-21 DIAGNOSIS — L97409 Non-pressure chronic ulcer of unspecified heel and midfoot with unspecified severity: Secondary | ICD-10-CM

## 2013-09-21 MED ORDER — AMOXICILLIN-POT CLAVULANATE 875-125 MG PO TABS: ORAL_TABLET | ORAL | Status: DC

## 2013-09-21 NOTE — Progress Notes (Signed)
[name] presents today with his son for followup of ulceration decubitus ulcer right heel. The last time he was in the heel was looking pretty good in was healing nicely his son states that it seems to be taking a turn for the worse.  Objective: Vital signs are stable alert and oriented x3. Pulses are barely palpable right lower extremity. Decubitus ulcer does demonstrate increase in skin breakdown in an increase in cellulitis.  Assessment decubitus ulcer right heel cellulitis with edema right.  Plan: Discussed etiology pathology conservative versus surgical therapies at this point I put him on Augmentin 875 one by mouth twice daily continued wound care twice weekly. I did suggest that he followup with wound care clinic which we will be making a referral to.

## 2013-09-23 ENCOUNTER — Ambulatory Visit (INDEPENDENT_AMBULATORY_CARE_PROVIDER_SITE_OTHER): Payer: Medicare PPO | Admitting: Cardiovascular Disease

## 2013-09-23 ENCOUNTER — Other Ambulatory Visit: Payer: Self-pay | Admitting: *Deleted

## 2013-09-23 ENCOUNTER — Encounter: Payer: Self-pay | Admitting: Cardiovascular Disease

## 2013-09-23 VITALS — BP 118/60 | HR 76 | Ht 72.0 in | Wt 194.0 lb

## 2013-09-23 DIAGNOSIS — L89620 Pressure ulcer of left heel, unstageable: Secondary | ICD-10-CM

## 2013-09-23 DIAGNOSIS — I350 Nonrheumatic aortic (valve) stenosis: Secondary | ICD-10-CM

## 2013-09-23 DIAGNOSIS — E785 Hyperlipidemia, unspecified: Secondary | ICD-10-CM

## 2013-09-23 DIAGNOSIS — I10 Essential (primary) hypertension: Secondary | ICD-10-CM

## 2013-09-23 DIAGNOSIS — L89609 Pressure ulcer of unspecified heel, unspecified stage: Secondary | ICD-10-CM

## 2013-09-23 DIAGNOSIS — I509 Heart failure, unspecified: Secondary | ICD-10-CM

## 2013-09-23 DIAGNOSIS — L89610 Pressure ulcer of right heel, unstageable: Secondary | ICD-10-CM

## 2013-09-23 DIAGNOSIS — I359 Nonrheumatic aortic valve disorder, unspecified: Secondary | ICD-10-CM

## 2013-09-23 DIAGNOSIS — I2510 Atherosclerotic heart disease of native coronary artery without angina pectoris: Secondary | ICD-10-CM

## 2013-09-23 DIAGNOSIS — L8995 Pressure ulcer of unspecified site, unstageable: Secondary | ICD-10-CM

## 2013-09-23 DIAGNOSIS — I4891 Unspecified atrial fibrillation: Secondary | ICD-10-CM

## 2013-09-23 DIAGNOSIS — I5032 Chronic diastolic (congestive) heart failure: Secondary | ICD-10-CM

## 2013-09-23 MED ORDER — FUROSEMIDE 20 MG PO TABS: 40.0000 mg | ORAL_TABLET | Freq: Every day | ORAL | Status: DC

## 2013-09-23 MED ORDER — FINASTERIDE 5 MG PO TABS: 5.0000 mg | ORAL_TABLET | Freq: Every day | ORAL | Status: DC

## 2013-09-23 MED ORDER — METOLAZONE 2.5 MG PO TABS: 2.5000 mg | ORAL_TABLET | ORAL | Status: DC

## 2013-09-23 MED ORDER — TAMSULOSIN HCL 0.4 MG PO CAPS: 0.4000 mg | ORAL_CAPSULE | Freq: Every day | ORAL | Status: DC

## 2013-09-23 MED ORDER — AMIODARONE HCL 200 MG PO TABS: 100.0000 mg | ORAL_TABLET | Freq: Two times a day (BID) | ORAL | Status: DC

## 2013-09-23 MED ORDER — POTASSIUM CHLORIDE ER 10 MEQ PO TBCR: 10.0000 meq | EXTENDED_RELEASE_TABLET | Freq: Every morning | ORAL | Status: DC

## 2013-09-23 NOTE — Assessment & Plan Note (Signed)
Currently not on a statin °

## 2013-09-23 NOTE — Assessment & Plan Note (Signed)
He only wants to take Lasix 40 mg daily. Edema is stable but still 1+ in the lower extremities. Weight is down 7 pounds. Suggested he call our office if edema gets worse and we would recommend taking extra Lasix at lunchtime

## 2013-09-23 NOTE — Assessment & Plan Note (Signed)
Maintaining normal sinus rhythm. Continue current medications 

## 2013-09-23 NOTE — Telephone Encounter (Signed)
Requested Prescriptions   Signed Prescriptions Disp Refills  . finasteride (PROSCAR) 5 MG tablet 30 tablet 3    Sig: Take 1 tablet (5 mg total) by mouth daily.    Authorizing Provider: Antonieta IbaGOLLAN, TIMOTHY J    Ordering User: Kendrick FriesLOPEZ, Tyrail Grandfield C

## 2013-09-23 NOTE — Patient Instructions (Signed)
You are doing well. No medication changes were made.  Please take extra lasix at noon for worsening leg swelling  Please call us if you have new issues that need to be addressed before your next appt.  Your physician wants you to follow-up in: 6 months.  You will receive a reminder letter in the mail two months in advance. If you don't receive a letter, please call our office to schedule the follow-up appointment.

## 2013-09-23 NOTE — Assessment & Plan Note (Signed)
Mild aortic valve stenosis clinically and by peak velocity, mean and peak gradients. Suggested repeat echocardiogram next year once his leg has healed and heel sore has healed

## 2013-09-23 NOTE — Progress Notes (Signed)
Patient ID: [name], male    DOB: Sep 25, 1924, 80 y.o.   MRN: [id number]  HPI Comments: [name] is an 80 year old gentleman with history of CAD, PCI of the circumflex in 2002, catheterization in 2010 showing patent circumflex and noncritical CAD with normal right-sided pressures, ejection fraction 50-55% April 2014 with mild aortic valve stenosis, gradient 2.26 m/s, chronic lower extremity swelling that per the notes has had a history of atrial fibrillation and is on pradaxa 150 mg daily (details unavailable) , who presents for routine followup He has a history of Parkinson's He presents today with his son Reports having left leg surgery in the past several months and is still recovering. This was performed by Dr. Ernest PineHooten. He tolerated the surgery well with no complications.  He denies any tachycardia or palpitations concerning for recurrent atrial fibrillation. Weight is down 7 pounds from his prior clinic visit in October 2014. Hemoglobin A1c 6.5 He has developed a sore on his right heel and now has a heel protector. He has been referred to the wound clinic Continues to have leg edema, takes Lasix 40 mg daily. Does not like to take more Lasix than that.  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 amiodarone (PACERONE) 200 MG tablet Take 100 mg by mouth 2 (two) times daily.  Marland Kitchen. amoxicillin-clavulanate (AUGMENTIN) 875-125 MG per tablet Take one tablet by mouth twice daily.  . beclomethasone (QVAR) 80 MCG/ACT inhaler Inhale 1 puff into the lungs daily.  . carbidopa-levodopa (SINEMET IR) 25-100 MG per tablet Take 1 tablet by mouth 4 (four) times daily.  . Carbidopa-Levodopa ER (SINEMET CR) 25-100 MG tablet controlled release Take 1 tablet by mouth 2 (two) times daily.  Marland Kitchen. docusate sodium (COLACE) 100 MG capsule Take 100 mg by mouth 4 (four) times daily.  Marland Kitchen. FINASTERIDE PO Take 10 mg by mouth daily.  . furosemide (LASIX) 20 MG tablet Take 2 tablets (40 mg total) by mouth daily.  Marland Kitchen. levothyroxine (SYNTHROID, LEVOTHROID) 150 MCG tablet Take 150 mcg by mouth daily before breakfast.  . metolazone (ZAROXOLYN) 2.5 MG tablet Take 2.5 mg by mouth once a week. On Friday  . omeprazole (PRILOSEC) 20 MG capsule Take 20 mg by mouth daily.  . potassium chloride (K-DUR) 10 MEQ tablet Take 1 tablet (10 mEq total) by mouth every morning.  . pramipexole (MIRAPEX) 1 MG tablet Take 1/4 tablet four times daily.  . sitaGLIPtin (JANUVIA) 100 MG tablet Take 100 mg by mouth daily.  . tamsulosin (FLOMAX) 0.4 MG CAPS capsule Take 0.4 mg by mouth daily after supper.    Review of Systems  Constitutional: Negative.   HENT: Negative.   Eyes: Negative.   Respiratory: Negative.   Cardiovascular: Positive for leg  swelling.  Gastrointestinal: Negative.   Endocrine: Negative.   Musculoskeletal: Negative.   Skin: Negative.   Allergic/Immunologic: Negative.   Neurological: Negative.   Hematological: Negative.   Psychiatric/Behavioral: Negative.   All other systems reviewed and are negative.   BP 118/60  Pulse 76  Ht 6' (1.829 m)  Wt 194 lb (87.998 kg)  BMI 26.31 kg/m2  Physical Exam  Nursing note and vitals reviewed. Constitutional: He is oriented to person, place, and time. He appears  well-developed and well-nourished.  HENT:  Head: Normocephalic.  Nose: Nose normal.  Mouth/Throat: Oropharynx is clear and moist.  Eyes: Conjunctivae are normal. Pupils are equal, round, and reactive to light.  Neck: Normal range of motion. Neck supple. No JVD present.  Cardiovascular: Normal rate, regular rhythm, S1 normal, S2 normal, normal heart sounds and intact distal pulses.  Exam reveals no gallop and no friction rub.   No murmur heard. 1+ pitting edema to the midshin bilaterally lower extremity  Pulmonary/Chest: Effort normal and breath sounds normal. No respiratory distress. He has no wheezes. He has no rales. He exhibits no tenderness.  Scant crackles at the bases bilaterally with deep inspiration  Abdominal: Soft. Bowel sounds are normal. He exhibits no distension. There is no tenderness.  Musculoskeletal: Normal range of motion. He exhibits no edema and no tenderness.  Lymphadenopathy:    He has no cervical adenopathy.  Neurological: He is alert and oriented to person, place, and time. Coordination normal.  Skin: Skin is warm and dry. No rash noted. No erythema.  Psychiatric: He has a normal mood and affect. His behavior is normal. Judgment and thought content normal.      Assessment and Plan

## 2013-09-23 NOTE — Assessment & Plan Note (Signed)
Currently with no symptoms of angina. No further workup at this time. Continue current medication regimen. 

## 2013-09-23 NOTE — Assessment & Plan Note (Signed)
Blood pressure is well controlled on today's visit. No changes made to the medications. 

## 2013-09-23 NOTE — Assessment & Plan Note (Signed)
He has been referred to the wound clinic for evaluation of his sore on the heel

## 2013-09-25 ENCOUNTER — Encounter: Payer: Self-pay | Admitting: Surgery

## 2013-09-29 ENCOUNTER — Ambulatory Visit: Payer: Self-pay | Admitting: Surgery

## 2013-10-02 ENCOUNTER — Encounter: Payer: Self-pay | Admitting: Surgery

## 2013-10-30 ENCOUNTER — Ambulatory Visit: Payer: Self-pay | Admitting: Vascular Surgery

## 2013-10-30 LAB — BASIC METABOLIC PANEL
Anion Gap: 6 — ABNORMAL LOW (ref 7–16)
BUN: 16 mg/dL (ref 7–18)
CHLORIDE: 104 mmol/L (ref 98–107)
CO2: 29 mmol/L (ref 21–32)
CREATININE: 1.15 mg/dL (ref 0.60–1.30)
Calcium, Total: 9.2 mg/dL (ref 8.5–10.1)
GFR CALC NON AF AMER: 57 — AB
Glucose: 162 mg/dL — ABNORMAL HIGH (ref 65–99)
OSMOLALITY: 282 (ref 275–301)
Potassium: 4.2 mmol/L (ref 3.5–5.1)
SODIUM: 139 mmol/L (ref 136–145)

## 2013-11-02 ENCOUNTER — Encounter: Payer: Self-pay | Admitting: Surgery

## 2013-11-13 ENCOUNTER — Encounter: Payer: Self-pay | Admitting: Surgery

## 2013-12-02 ENCOUNTER — Ambulatory Visit: Payer: Self-pay | Admitting: General Surgery

## 2013-12-02 ENCOUNTER — Encounter: Payer: Self-pay | Admitting: Surgery

## 2014-01-02 ENCOUNTER — Encounter: Payer: Self-pay | Admitting: Surgery

## 2014-01-26 ENCOUNTER — Other Ambulatory Visit: Payer: Self-pay

## 2014-01-26 MED ORDER — POTASSIUM CHLORIDE ER 10 MEQ PO TBCR: 10.0000 meq | EXTENDED_RELEASE_TABLET | Freq: Every morning | ORAL | Status: DC

## 2014-01-26 MED ORDER — AMIODARONE HCL 200 MG PO TABS: 100.0000 mg | ORAL_TABLET | Freq: Two times a day (BID) | ORAL | Status: DC

## 2014-01-26 NOTE — Telephone Encounter (Signed)
Refill sent for potassium & amiodarone to [facility] order for 90 day supply.

## 2014-02-02 ENCOUNTER — Encounter: Payer: Self-pay | Admitting: General Practice

## 2014-02-02 ENCOUNTER — Encounter: Payer: Self-pay | Admitting: Surgery

## 2014-03-04 ENCOUNTER — Encounter: Payer: Self-pay | Admitting: General Practice

## 2014-03-18 DIAGNOSIS — G20 Parkinson's disease: Secondary | ICD-10-CM | POA: Insufficient documentation

## 2014-03-23 ENCOUNTER — Ambulatory Visit (INDEPENDENT_AMBULATORY_CARE_PROVIDER_SITE_OTHER): Payer: Medicare PPO | Admitting: Cardiovascular Disease

## 2014-03-23 ENCOUNTER — Encounter: Payer: Self-pay | Admitting: Cardiovascular Disease

## 2014-03-23 VITALS — BP 110/62 | HR 85 | Ht 72.0 in | Wt 195.0 lb

## 2014-03-23 DIAGNOSIS — I350 Nonrheumatic aortic (valve) stenosis: Secondary | ICD-10-CM

## 2014-03-23 DIAGNOSIS — I2510 Atherosclerotic heart disease of native coronary artery without angina pectoris: Secondary | ICD-10-CM

## 2014-03-23 DIAGNOSIS — I5032 Chronic diastolic (congestive) heart failure: Secondary | ICD-10-CM

## 2014-03-23 DIAGNOSIS — R600 Localized edema: Secondary | ICD-10-CM

## 2014-03-23 DIAGNOSIS — I4891 Unspecified atrial fibrillation: Secondary | ICD-10-CM

## 2014-03-23 DIAGNOSIS — L89610 Pressure ulcer of right heel, unstageable: Secondary | ICD-10-CM

## 2014-03-23 DIAGNOSIS — I10 Essential (primary) hypertension: Secondary | ICD-10-CM

## 2014-03-23 MED ORDER — FUROSEMIDE 20 MG PO TABS: 20.0000 mg | ORAL_TABLET | Freq: Two times a day (BID) | ORAL | Status: DC | PRN

## 2014-03-23 MED ORDER — CLOPIDOGREL BISULFATE 75 MG PO TABS: 75.0000 mg | ORAL_TABLET | Freq: Every day | ORAL | Status: DC

## 2014-03-23 NOTE — Assessment & Plan Note (Signed)
Recommended he increase his Lasix back to 40 mg daily or 20 mg twice a day. He takes banana daily. We'll check basic metabolic panel today

## 2014-03-23 NOTE — Assessment & Plan Note (Signed)
Blood pressure is well controlled on today's visit. No changes made to the medications. 

## 2014-03-23 NOTE — Assessment & Plan Note (Signed)
Son reports that he continues to have a sore on his heel and both are reluctant to place compression hose

## 2014-03-23 NOTE — Progress Notes (Signed)
Patient ID: [name], male    DOB: 03/15/1925, 80 y.o.   MRN: [id number]  HPI Comments: [name] is an 80 year old gentleman with history of CAD, PCI of the circumflex in 2002, catheterization in 2010 showing patent circumflex and noncritical CAD with normal right-sided pressures, ejection fraction 50-55% April 2014 with mild aortic valve stenosis, gradient 2.26 m/s, chronic lower extremity swelling that per the notes has had a history of atrial fibrillation and is on pradaxa 150 mg daily (details unavailable) , who presents for routine followup He has a history of Parkinson's He presents today with his son  left leg surgery performed by Dr. Ernest PineHooten.   In followup today, he reports that he is doing well. He does have some urinary incontinence. Legs are down most of the day and he has chronic leg swelling bilaterally to the mid shins. Denies any significant shortness of breath. Possibly has some abdominal swelling as pants are tight. Continues to recover from a heel sore on the right heel Does not like to wear compression hose He denies any tachycardia or palpitations concerning for recurrent atrial fibrillation. Previously was taking Lasix 40 mg daily. 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 amiodarone (PACERONE) 200 MG tablet Take 0.5 tablets (100 mg total) by mouth 2 (two) times daily.  . carbidopa-levodopa (SINEMET IR) 25-100 MG per tablet Take 1 tablet by mouth 4 (four) times daily.  . Carbidopa-Levodopa ER (SINEMET CR) 25-100 MG tablet controlled release Take 1 tablet by mouth 2 (two) times daily.  . clopidogrel (PLAVIX) 75 MG tablet Take 1 tablet (75 mg total) by mouth daily.  Marland Kitchen. docusate sodium (COLACE) 100 MG capsule Take 100 mg by mouth 4 (four) times daily.  . furosemide (LASIX) 20 MG tablet Take 1 tablet (20 mg total) by mouth 2 (two) times daily as needed.  Marland Kitchen. levothyroxine (SYNTHROID, LEVOTHROID) 150 MCG tablet Take 150 mcg by mouth daily before breakfast.  . omeprazole (PRILOSEC) 20 MG capsule Take 20 mg by mouth daily.  . potassium chloride (K-DUR) 10 MEQ tablet Take 1 tablet (10 mEq total) by mouth every morning.  . pramipexole (MIRAPEX) 1 MG tablet Take 1/2 tablet four times daily.  . sitaGLIPtin (JANUVIA) 100 MG tablet Take 100 mg by mouth daily.  . tamsulosin (FLOMAX) 0.4 MG CAPS capsule Take 1 capsule (0.4 mg total) by mouth daily after supper.   Review of Systems  Constitutional: Negative.   HENT: Negative.   Eyes: Negative.   Respiratory: Negative.   Cardiovascular: Positive for leg swelling.  Gastrointestinal: Negative.   Endocrine: Negative.   Musculoskeletal: Negative.   Skin: Negative.   Allergic/Immunologic: Negative.   Neurological: Negative.   Hematological: Negative.  Psychiatric/Behavioral: Negative.   All other systems reviewed and are negative.   BP 110/62  Pulse 85  Ht 6' (1.829 m)  Wt 195 lb (88.451 kg)  BMI 26.44 kg/m2  Physical Exam  Nursing note and vitals reviewed. Constitutional: He is oriented to person, place, and time. He appears well-developed and well-nourished.  Presents in a wheelchair  HENT:  Head: Normocephalic.  Nose: Nose normal.  Mouth/Throat: Oropharynx is  clear and moist.  Eyes: Conjunctivae are normal. Pupils are equal, round, and reactive to light.  Neck: Normal range of motion. Neck supple. No JVD present.  Cardiovascular: Normal rate, regular rhythm, S1 normal, S2 normal, normal heart sounds and intact distal pulses.  Exam reveals no gallop and no friction rub.   No murmur heard. 1+ pitting edema to the midshin bilaterally lower extremity  Pulmonary/Chest: Effort normal and breath sounds normal. No respiratory distress. He has no wheezes. He has no rales. He exhibits no tenderness.  Scant crackles at the bases bilaterally with deep inspiration  Abdominal: Soft. Bowel sounds are normal. He exhibits no distension. There is no tenderness.  Musculoskeletal: Normal range of motion. He exhibits no edema and no tenderness.  Lymphadenopathy:    He has no cervical adenopathy.  Neurological: He is alert and oriented to person, place, and time. Coordination normal.  Skin: Skin is warm and dry. No rash noted. No erythema.  Psychiatric: He has a normal mood and affect. His behavior is normal. Judgment and thought content normal.      Assessment and Plan

## 2014-03-23 NOTE — Assessment & Plan Note (Signed)
No symptoms concerning for arrhythmia or atrial fibrillation

## 2014-03-23 NOTE — Assessment & Plan Note (Signed)
Long discussion today concerning his leg edema. Recommended he go back to Lasix 40 mg daily, wear compression hose, leg elevation during the daytime. Son reports he is relatively noncompliant with leg elevation and compression hose

## 2014-03-23 NOTE — Patient Instructions (Addendum)
Your next appointment will be scheduled in our new office located at :  [facility]  [address], [address]  [place], [place] [id number]   You are doing well. For leg swelling, Take 2 lasix pills a day if possible for leg swelling  We will check your labs today, BMP We will call you with the results  Please call us if you have new issues that need to be addressed before your next appt.  Your physician wants you to follow-up in: 3 months.  You will receive a reminder letter in the mail two months in advance. If you don't receive a letter, please call our office to schedule the follow-up appointment.

## 2014-03-23 NOTE — Assessment & Plan Note (Signed)
Mild aortic valve stenosis. 

## 2014-03-23 NOTE — Assessment & Plan Note (Signed)
Currently with no symptoms of angina. No further workup at this time. Continue current medication regimen. 

## 2014-03-24 LAB — BASIC METABOLIC PANEL
BUN/Creatinine Ratio: 22 (ref 10–22)
BUN: 22 mg/dL (ref 8–27)
CALCIUM: 8.9 mg/dL (ref 8.6–10.2)
CO2: 21 mmol/L (ref 18–29)
Chloride: 104 mmol/L (ref 97–108)
Creatinine, Ser: 1.02 mg/dL (ref 0.76–1.27)
GFR calc Af Amer: 76 mL/min/{1.73_m2} (ref >59)
GFR calc non Af Amer: 66 mL/min/{1.73_m2} (ref >59)
Glucose: 152 mg/dL — ABNORMAL HIGH (ref 65–99)
POTASSIUM: 4 mmol/L (ref 3.5–5.2)
SODIUM: 142 mmol/L (ref 134–144)

## 2014-03-26 ENCOUNTER — Telehealth: Payer: Self-pay | Admitting: Cardiovascular Disease

## 2014-03-26 NOTE — Telephone Encounter (Signed)
Pt had labs done Tuesday, calling to find out if he can take his fluid pills, please call patient back.

## 2014-03-26 NOTE — Telephone Encounter (Signed)
Patient would like to know if he can increase his lasix based on his lab results He stated this was discussed in clinic with Dr. Mariah MillingGollan

## 2014-03-28 NOTE — Telephone Encounter (Incomplete)
Yes, See

## 2014-03-29 NOTE — Telephone Encounter (Signed)
Returned pt son call. Pt son Onalee HuaDavid aware of lab results and Dr.Gollan's recommendations. Normal renal function Would recommend he up his lasix as mention on office visit for leg edema Pt son sts that pt is reluctant to start increase. Pt will take lasix 40mg  daily until Le edema has resolved Pt son rqst a call in 2 wk to ck status. Delman KittenAdv David I will fwd Dr.Gollans nurse Angelica ChessmanMandy, RN to f/ u with pt. Adv him it would be a good idea for him to call in with an update as well Pt son verbalized understanding.

## 2014-03-29 NOTE — Telephone Encounter (Signed)
Message copied by Jarvis NewcomerPARRIS-GODLEY, LISA S on Mon Mar 29, 2014  1:52 PM ------      Message from: Antonieta IbaGOLLAN, TIMOTHY J      Created: Sun Mar 28, 2014 12:08 PM       Normal renal function      Would recommend he up his lasix as mention on office visit for leg edema ------

## 2014-03-29 NOTE — Telephone Encounter (Signed)
Yes, would refer to lab note

## 2014-03-29 NOTE — Telephone Encounter (Signed)
Message copied by Jarvis NewcomerPARRIS-GODLEY, Neelah Mannings S on Mon Mar 29, 2014  8:10 AM ------      Message from: Antonieta IbaGOLLAN, TIMOTHY J      Created: Sun Mar 28, 2014 12:08 PM       Normal renal function      Would recommend he up his lasix as mention on office visit for leg edema ------

## 2014-03-29 NOTE — Telephone Encounter (Signed)
called to give pt lab results and Dr.Gollan instructions. pt sts that he would prefer I talk with his son. pt will have his son call the office to get results and instructions.

## 2014-04-04 ENCOUNTER — Encounter: Payer: Self-pay | Admitting: General Practice

## 2014-04-28 DIAGNOSIS — M179 Osteoarthritis of knee, unspecified: Secondary | ICD-10-CM | POA: Insufficient documentation

## 2014-04-28 DIAGNOSIS — M1710 Unilateral primary osteoarthritis, unspecified knee: Secondary | ICD-10-CM | POA: Insufficient documentation

## 2014-05-03 ENCOUNTER — Ambulatory Visit: Payer: Commercial Managed Care - HMO

## 2014-05-04 ENCOUNTER — Encounter: Payer: Self-pay | Admitting: General Practice

## 2014-05-05 ENCOUNTER — Ambulatory Visit: Payer: Commercial Managed Care - HMO | Admitting: Podiatry

## 2014-05-05 ENCOUNTER — Ambulatory Visit (INDEPENDENT_AMBULATORY_CARE_PROVIDER_SITE_OTHER): Payer: Commercial Managed Care - HMO | Admitting: Podiatry

## 2014-05-05 VITALS — BP 102/60 | HR 101 | Resp 16

## 2014-05-05 DIAGNOSIS — M79676 Pain in unspecified toe(s): Secondary | ICD-10-CM

## 2014-05-05 DIAGNOSIS — B351 Tinea unguium: Secondary | ICD-10-CM

## 2014-05-06 ENCOUNTER — Ambulatory Visit: Payer: Self-pay | Admitting: General Practice

## 2014-05-06 NOTE — Progress Notes (Signed)
He presents today to complaint of painful elongated toenails.  Objective: Nails are thick yellow dystrophic with mycotic bilateral.  Assessment: Pain limits any onychomycosis 1 through 5 bilateral.  Plan: Debridement of nails 1 through 5 bilateral.

## 2014-06-23 ENCOUNTER — Ambulatory Visit (INDEPENDENT_AMBULATORY_CARE_PROVIDER_SITE_OTHER): Payer: PPO | Admitting: Cardiovascular Disease

## 2014-06-23 ENCOUNTER — Encounter: Payer: Self-pay | Admitting: Cardiovascular Disease

## 2014-06-23 VITALS — BP 100/60 | HR 91 | Ht 72.0 in | Wt 195.0 lb

## 2014-06-23 DIAGNOSIS — E1165 Type 2 diabetes mellitus with hyperglycemia: Secondary | ICD-10-CM

## 2014-06-23 DIAGNOSIS — E785 Hyperlipidemia, unspecified: Secondary | ICD-10-CM

## 2014-06-23 DIAGNOSIS — I350 Nonrheumatic aortic (valve) stenosis: Secondary | ICD-10-CM

## 2014-06-23 DIAGNOSIS — E119 Type 2 diabetes mellitus without complications: Secondary | ICD-10-CM

## 2014-06-23 DIAGNOSIS — R600 Localized edema: Secondary | ICD-10-CM

## 2014-06-23 DIAGNOSIS — I5032 Chronic diastolic (congestive) heart failure: Secondary | ICD-10-CM

## 2014-06-23 DIAGNOSIS — I4891 Unspecified atrial fibrillation: Secondary | ICD-10-CM

## 2014-06-23 DIAGNOSIS — I10 Essential (primary) hypertension: Secondary | ICD-10-CM

## 2014-06-23 NOTE — Progress Notes (Signed)
Patient ID: [name], male    DOB: 1924/11/25, 81 y.o.   MRN: [id number]  HPI Comments: Mr. Doughtie is an 81 year old gentleman with history of CAD, PCI of the circumflex in 2002, catheterization in 2010 showing patent circumflex and noncritical CAD with normal right-sided pressures, ejection fraction 50-55% April 2014 with mild aortic valve stenosis, gradient 2.26 m/s, chronic lower extremity swelling that per the notes has had a history of atrial fibrillation and is on pradaxa 150 mg daily (details unavailable) , who presents for routine followup  Of his coronary artery disease He has a history of Parkinson's He presents today with his son  left leg surgery performed by Dr. Ernest Pine.    patient reports that he drink significant fluids in the daytime. Mouth is always dry, always has a glass of water in front of him. This is confirmed by the son.  Previously encouraged to increase Lasix up to 40 mg daily given his worsening lower extremity edema. He does not like to wear compression hose.  Legs are down most of the day, has chronic leg edema.  He does report having some shortness of breath at nighttime, wakes up ,  Symptomatic, symptoms resolve after several seconds. He reports having diagnoses of sleep apnea but does not wear CPAP.  Sits most of the day in the chair, no longer walking secondary to chronic knee pain. Scheduled to see Dr. Ernest Pine. He is bothered by frequency of urination.  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 amiodarone (PACERONE) 200 MG tablet Take 0.5 tablets (100 mg total) by mouth 2 (two) times daily.  . carbidopa-levodopa (SINEMET IR) 25-100 MG per tablet Take 1 tablet by mouth 4 (four) times daily.  . Carbidopa-Levodopa ER (SINEMET CR) 25-100 MG tablet controlled release Take 1 tablet by mouth 2 (two) times daily.  . clopidogrel (PLAVIX) 75 MG tablet Take 1 tablet (75 mg total) by mouth daily.  Marland Kitchen docusate sodium (COLACE) 100 MG capsule Take 100 mg by mouth 4 (four) times daily.  . furosemide (LASIX) 20 MG tablet Take 1 tablet (20 mg total) by mouth 2 (two) times daily as needed.  Marland Kitchen levothyroxine (SYNTHROID, LEVOTHROID) 150 MCG tablet Take 150 mcg by mouth daily before breakfast.  . omeprazole (PRILOSEC) 20 MG capsule Take 20 mg by mouth daily.  . potassium chloride (K-DUR) 10 MEQ tablet Take 1 tablet (10 mEq total) by mouth every morning.  . pramipexole (MIRAPEX) 1 MG tablet Take 1/2 tablet four times daily.  . sitaGLIPtin (JANUVIA) 100 MG tablet Take 100 mg by mouth daily.  . tamsulosin (FLOMAX) 0.4 MG CAPS capsule Take 1 capsule (0.4 mg total) by mouth daily after supper.    Past Medical History  Diagnosis Date  . Coronary artery disease   . Diabetes mellitus without complication   . Aortic stenosis   . Hypertension   . Hyperlipidemia   .  TIA (transient ischemic attack)   . Thyroid disease   . Hypothyroidism     Past Surgical History  Procedure Laterality Date  . Appendectomy    . Hernia repair    . Knee surgery      bilateral  . Eye surgery    . Colonoscopy    . Cardiac catheterization  2010    showed a patient circumflex stent with noncritical CAD and normal right-sided pressures.  . Coronary angioplasty  01/24/2007    stent placement to the mid circumflex   . Knee surgery Left     Social History  reports that he has never smoked. He has never used  smokeless tobacco. He reports that he does not drink alcohol or use illicit drugs.  Family History family history includes Heart attack (age of onset: 4539) in his brother; Heart attack (age of onset: 4162) in his father; Heart disease in his mother.   Review of Systems  Constitutional: Negative.   Eyes: Negative.   Respiratory: Negative.   Cardiovascular: Positive for leg swelling.  Gastrointestinal: Negative.   Musculoskeletal: Positive for gait problem.        Profound leg weakness , knee pain bilaterally  Skin: Negative.   Neurological: Negative.   Hematological: Negative.   Psychiatric/Behavioral: Negative.   All other systems reviewed and are negative.  BP 100/60 mmHg  Pulse 91  Ht 6' (1.829 m)  Wt 195 lb (88.451 kg)  BMI 26.44 kg/m2  Physical Exam  Constitutional: He is oriented to person, place, and time. He appears well-developed and well-nourished.  Presents in a wheelchair  HENT:  Head: Normocephalic.  Nose: Nose normal.  Mouth/Throat: Oropharynx is clear and moist.  Eyes: Conjunctivae are normal. Pupils are equal, round, and reactive to light.  Neck: Normal range of motion. Neck supple. No JVD present.  Cardiovascular: Normal rate, regular rhythm, S1 normal, S2 normal, normal heart sounds and intact distal pulses.  Exam reveals no gallop and no friction rub.   No murmur heard. 1+ pitting edema to the midshin bilaterally lower extremity  Pulmonary/Chest: Effort normal and breath sounds normal. No respiratory distress. He has no wheezes. He has no rales. He exhibits no tenderness.  Scant crackles at the bases bilaterally with deep inspiration  Abdominal: Soft. Bowel sounds are normal. He exhibits no distension. There is no tenderness.  Musculoskeletal: Normal range of motion. He exhibits no edema or tenderness.  Lymphadenopathy:    He has no cervical adenopathy.  Neurological: He is alert and oriented to person, place, and time. Coordination normal.  Skin: Skin is  warm and dry. No rash noted. No erythema.  Psychiatric: He has a normal mood and affect. His behavior is normal. Judgment and thought content normal.      Assessment and Plan   Nursing note and vitals reviewed.

## 2014-06-23 NOTE — Assessment & Plan Note (Addendum)
Currently not on a statin. Would not start one secondary to profound leg weakness

## 2014-06-23 NOTE — Assessment & Plan Note (Signed)
Encouraged him to stay on his Lasix 40 mg in the morning for now. Echocardiogram to estimate right particular systolic pressures

## 2014-06-23 NOTE — Assessment & Plan Note (Signed)
Terrible diet. This was discussed with him. He does not seem to care, eats candy and other items without any restraint.

## 2014-06-23 NOTE — Patient Instructions (Signed)
You are doing well. No medication changes were made.  We will schedule an echocardiogram for aortic valve stenosis, leg swelling  Please call us if you have new issues that need to be addressed before your next appt.  Your physician wants you to follow-up in: 6 months.  You will receive a reminder letter in the mail two months in advance. If you don't receive a letter, please call our office to schedule the follow-up appointment.

## 2014-06-23 NOTE — Assessment & Plan Note (Signed)
Blood pressure is well controlled on today's visit. No changes made to the medications. 

## 2014-06-23 NOTE — Assessment & Plan Note (Signed)
Maintaining normal sinus rhythm on his current medication regimen. We'll continue amiodarone. Blood pressure running low , unable to add other medications

## 2014-06-23 NOTE — Assessment & Plan Note (Signed)
Repeat echocardiogram ordered given history of aortic valve stenosis. Last echocardiogram 2 years ago. Waking up with symptoms of shortness of breath, also with significant leg edema

## 2014-06-23 NOTE — Assessment & Plan Note (Signed)
Leg edema likely multifactorial including chronic venous insufficiency from having legs down most of the day, unable to exclude component of diastolic CHF. We'll continue Lasix for now, echocardiogram pending.

## 2014-07-05 ENCOUNTER — Other Ambulatory Visit: Payer: Self-pay

## 2014-07-05 ENCOUNTER — Other Ambulatory Visit (INDEPENDENT_AMBULATORY_CARE_PROVIDER_SITE_OTHER): Payer: PPO

## 2014-07-05 DIAGNOSIS — I5032 Chronic diastolic (congestive) heart failure: Secondary | ICD-10-CM

## 2014-07-05 DIAGNOSIS — I4891 Unspecified atrial fibrillation: Secondary | ICD-10-CM

## 2014-07-05 DIAGNOSIS — R600 Localized edema: Secondary | ICD-10-CM

## 2014-07-05 DIAGNOSIS — I350 Nonrheumatic aortic (valve) stenosis: Secondary | ICD-10-CM

## 2014-07-30 ENCOUNTER — Encounter: Payer: Self-pay | Admitting: General Practice

## 2014-08-09 ENCOUNTER — Other Ambulatory Visit: Payer: Commercial Managed Care - HMO

## 2014-08-10 ENCOUNTER — Ambulatory Visit (INDEPENDENT_AMBULATORY_CARE_PROVIDER_SITE_OTHER): Payer: PPO | Admitting: Podiatry

## 2014-08-10 DIAGNOSIS — M79676 Pain in unspecified toe(s): Secondary | ICD-10-CM

## 2014-08-10 DIAGNOSIS — B351 Tinea unguium: Secondary | ICD-10-CM | POA: Diagnosis not present

## 2014-08-10 NOTE — Patient Instructions (Signed)
Continue to look at your fee daily. You had some dried blood under the nails.  Continue antibiotic ointment and a band-aid to the left 2nd toe.  Follow-up in 2 weeks if not healed, or sooner if there are any problems. Monitor for any signs/symptoms of infection. Call the office immediately if any occur or go directly to the emergency room. Call with any questions/concerns..Marland Kitchen

## 2014-08-10 NOTE — Progress Notes (Signed)
Patient ID: [name], male   DOB: 1925/03/31, 81 y.o.   MRN: [id number]  Subjective: 81 y.o.-year-old male returns the office today for painful, elongated, thickened toenails which he is unable to trim himself. Denies any redness or drainage around the nails. He does state that the right 3rd digit toenail was loose and fell off.  He has a history of a ulcer on the right heel. Denies any acute changes since last appointment and no new complaints today. Denies any systemic complaints such as fevers, chills, nausea, vomiting.   Objective: AAO 3, NAD; presents in a wheelchair DP/PT pulses palpable, CRT less than 3 seconds Protective sensation grossly intact with Simms Weinstein monofilament, Achilles tendon reflex intact.  Nails hypertrophic, dystrophic, elongated, brittle, discolored 9. The right 3rd digit nail is not present and there is a scab with dried blood present over the nail bed. On the left 2nd digit there is blood under the toenail distally. This is likely from as the nail is elongated and irritation. There is tenderness overlying these nails 1-5 bilaterally except for the right 3rd. There is no surrounding erythema or drainage along the nail sites. No open lesions or pre-ulcerative lesions are identified bilaterally.  No other areas of tenderness bilateral lower extremities. No overlying edema, erythema, increased warmth. No pain with calf compression, swelling, warmth, erythema.  Assessment: Patient presents with symptomatic onychomycosis  Plan: -Treatment options including alternatives, risks, complications were discussed -Nails sharply debrided 9. There was some bleeding to the left second digit toenail upon debridement as there was blood under the nail. Recommended the patient continue with antibiotic ointment and a Band-Aid daily. Monitor for any signs or symptoms of infection and directed to call the office immediately should any occur or go to the ER. Follow-up in 2 weeks  if the area has not healed or sooner if there is any problems. -Recommended offloading to the right heel. I discussed with him how to do this. -Discussed daily foot inspection. If there are any changes, to call the office immediately.  -Follow-up in 3 months or sooner if any problems are to arise. In the meantime, encouraged to call the office with any questions, concerns, changes symptoms.

## 2014-08-11 NOTE — Telephone Encounter (Signed)
See below

## 2014-08-16 ENCOUNTER — Encounter
Admit: 2014-08-16 | Disposition: A | Discharge status: Home / Self Care | Payer: Self-pay | Attending: General Practice | Admitting: General Practice

## 2014-09-03 ENCOUNTER — Encounter
Admit: 2014-09-03 | Disposition: A | Discharge status: Home / Self Care | Payer: Self-pay | Attending: General Practice | Admitting: General Practice

## 2014-09-25 NOTE — Op Note (Signed)
PATIENT NAME:  [name], [name] MR#:  [id number] DATE OF BIRTH:  07/12/1924  DATE OF PROCEDURE:  07/17/2013  PREOPERATIVE DIAGNOSIS: Left quadriceps tendon rupture.   POSTOPERATIVE DIAGNOSIS: Left quadriceps tendon rupture.   PROCEDURE PERFORMED: Repair of left quadriceps tendon rupture.   SURGEON: Illene LabradorJames P. Hooten, MD  ANESTHESIA: Spinal.   ESTIMATED BLOOD LOSS: Minimal.   FLUIDS REPLACED: 500 mL of crystalloid.   TOURNIQUET TIME: 88 minutes.   DRAINS: None.   INDICATIONS FOR SURGERY: The patient is an 81 year old gentleman who was seen in the office with inability to extend the knee. He had a palpable defect and MRI demonstrated findings consistent with a complete rupture of the quadriceps tendon with retraction. After discussion of the risks and benefits of surgical intervention, the patient and family expressed understanding of the risks, benefits, and agreed with plans for surgical intervention.   PROCEDURE IN DETAIL: The patient was brought to the operating room and, after adequate spinal anesthesia was achieved, a tourniquet was placed on the patient's upper left thigh. The patient's left knee and leg were cleaned and prepped with alcohol and DuraPrep and draped in the usual sterile fashion. A "timeout" was performed as per usual protocol. The left lower extremity was exsanguinated using an Esmarch, and the tourniquet was inflated to 300 mmHg. An anterior longitudinal incision was made and carried down to the quadriceps tendon. There was complete rupture of the quadriceps tendon directly from the superior pole of the patella with a several centimeter retraction noted. Effusion was evacuated. Edges of the tendon were debrided. A bony trough was created along the superior pole of the patella using rongeurs for the planned reattachment. #5 Ethibond sutures were used to create fixation of the quadriceps tendon in 2 bundles using a Krackow suture method. Three 2.0 mm drill holes were placed  from superior to inferior and the associated sutures were pulled through the drill holes. Good approximation of the tendon into the bony trough was achieved. Sutures were tied accordingly and the knee was placed through a gentle range of motion with good maintenance of the repair. The retinaculum in both medial and lateral aspects was repaired using interrupted sutures of #0 Vicryl. The central portion of the repair was also reinforced using interrupted sutures of #0 Vicryl. The wound was irrigated with copious amounts of normal saline with antibiotic solution. The subcutaneous tissue was approximated in layers using first #0 Vicryl followed by #2-0 Vicryl. The skin was closed with skin staples. A sterile dressing was applied. A range of motion brace was subsequently applied with the brace locked in full extension. Tourniquet had been deflated after total tourniquet time of 88 minutes.   The patient tolerated the procedure well. He was transported to the recovery room in stable condition.    ____________________________ Illene LabradorJames P. Angie FavaHooten Jr., MD jph:dp D: 07/18/2013 07:30:34 ET T: 07/18/2013 08:02:45 ET JOB#: [id number]  cc: Illene LabradorJames P. Angie FavaHooten Jr., MD, <Dictator> Illene LabradorJAMES P Angie FavaHOOTEN JR MD ELECTRONICALLY SIGNED 07/24/2013 6:43

## 2014-09-25 NOTE — Op Note (Signed)
PATIENT NAME:  [name], [name] MR#:  [id number] DATE OF BIRTH:  1925-02-22  DATE OF PROCEDURE:  10/30/2013  PREOPERATIVE DIAGNOSES:  1.  Atherosclerotic occlusive disease, bilateral lower extremities, with ulceration of the right foot.  2.  Heel ulceration, right foot.  3.  Hypertension.  4.  Coronary artery disease.   POSTOPERATIVE DIAGNOSES:  1.  Atherosclerotic occlusive disease, bilateral lower extremities, with ulceration of the right foot.  2.  Heel ulceration, right foot.  3.  Hypertension.  4.  Coronary artery disease.   PROCEDURES PERFORMED: 1.  Abdominal aortogram.  2.  Right lower extremity distal runoff, 3rd order catheter placement.  3.  Crosser atherectomy, right posterior tibial artery.  4.  Percutaneous transluminal angioplasty to 3 mm, right posterior tibial artery.   SURGEON: Levora Dredge, M.D.   SEDATION: Versed 4 mg plus fentanyl 150 mcg administered IV. Continuous ECG, pulse oximetry and cardiopulmonary monitoring was performed throughout the entire procedure by the interventional radiology nurse. Total sedation time was 1 hour 15 minutes.   ACCESS: A 6-French sheath, left common femoral artery.   FLUOROSCOPY TIME: 5.8 minutes.   CONTRAST USED: Isovue 68 mL.   INDICATIONS: Mr. Cloe an 81 year old gentleman who presented to the office with a nonhealing ulceration of the right heel. Physical examination as well as noninvasive studies demonstrated significant atherosclerotic occlusive disease, and he is therefore undergoing angiography with the hope for intervention for limb salvage. The risks and benefits are reviewed, all questions answered, and the patient agrees to proceed.   DESCRIPTION OF PROCEDURE: The patient was taken to special procedures and placed in the supine position. After adequate sedation is achieved, both groins are prepped and draped in sterile fashion. Appropriate timeout is called.   Ultrasound is placed in a sterile sleeve. Common  femoral artery on the left is identified. It is echolucent and pulsatile indicating patency. Image is recorded for the permanent record and under real-time visualization a micropuncture needle is inserted, microwire followed by microsheath, J-wire followed by a 5-French sheath and 5-French pigtail catheter.   The pigtail catheter is positioned at the level of T12 and AP projection of the aorta is obtained. Pigtail catheter is repositioned to above the bifurcation and a LAO projection of the pelvis is obtained. Stiff angled glidewire and pigtail catheter are then used to cross the bifurcation. The catheter is negotiated down into the SFA. Hand injection of contrast in the RAO projection is then used to demonstrate the common femoral artery and the femoral bifurcation and subsequently the distal runoff is obtained in the AP projection.   After reviewing the images, 4000 units of heparin is given. An additional 1000 units is given at the time of the intervention for a total of 5000 units of heparin during the case. A straight slip catheter and the glidewire are then negotiated down into the popliteal where images are obtained of the tibial vessel by hand injection. Subsequently, catheter wire is negotiated into the posterior tibial, which is occluded throughout its middle one-third. Magnified imaging of the posterior tibial artery and the occlusion is then obtained and a 0.014 Spartacore wire is introduced through the straight slip catheter. A straight Usher catheter is then advanced over the wire and the wire is removed. A Crosser S6 device is then used to negotiate the occlusion. Intraluminal positioning is verified by hand injection through the Usher catheter and subsequently the Spartacore wire is reintroduced. A 3 x 30 Ultraverse balloon is then inflated across the lesion.  Inflation is to 14 atmospheres for 2 minutes. Follow-up imaging demonstrates there is now wide patency of the posterior tibial and flow  down into the lateral plantar is more rapid then through the peroneal.   The sheath is then repositioned to the left external iliac, oblique view of the left groin is obtained, and a StarClose device deployed. There are no immediate complications.   INTERPRETATION: The aorta is opacified with a bolus injection of contrast. There is diffuse disease but there are no hemodynamically significant stenoses. Iliac arteries are moderately tortuous but widely patent.   The right common femoral, profunda femoris and superficial femoral, as well as the popliteal are widely patent. Diffuse disease is noted, but again there are no hemodynamically significant stenoses. The anterior tibial is patent at its origin, but then occludes after several centimeters. It does not reconstitute nor is the dorsalis pedis visualized. The peroneal and tibioperoneal trunk are patent down to the ankle. The posterior tibial is patent in its proximal several centimeters, but it is diffusely diseased with several hemodynamically significant lesions. Its middle one-third is occluded. This is a distance of approximately 5 cm and then there is reconstitution of the posterior tibial above the ankle which fills the lateral plantar quite nicely. The lateral plantar actually measures 2.5 to 3 mm in diameter.   Following successful Crosser atherectomy the posterior tibial artery is angioplastied to 3 mm with an excellent result.   SUMMARY: Successful recanalization of the posterior tibial for limb salvage, right lower extremity.   ____________________________ Renford DillsGregory G. Schnier, MD ggs:sb D: 10/30/2013 10:13:41 ET T: 10/30/2013 10:43:32 ET JOB#: [id number]  cc: Renford DillsGregory G. Schnier, MD, <Dictator> Steele SizerMark A. Crissman, MD Renford DillsGREGORY G SCHNIER MD ELECTRONICALLY SIGNED 11/03/2013 12:30

## 2014-10-04 ENCOUNTER — Other Ambulatory Visit: Payer: Self-pay | Admitting: Cardiovascular Disease

## 2014-10-04 ENCOUNTER — Encounter: Payer: PPO | Admitting: Physical Therapy

## 2014-10-05 ENCOUNTER — Other Ambulatory Visit: Payer: Self-pay

## 2014-10-05 MED ORDER — AMIODARONE HCL 200 MG PO TABS: 100.0000 mg | ORAL_TABLET | Freq: Two times a day (BID) | ORAL | Status: DC

## 2014-10-05 NOTE — Telephone Encounter (Signed)
Refill sent for amiodarone 

## 2014-10-11 ENCOUNTER — Encounter: Payer: Self-pay | Admitting: Physical Therapy

## 2014-10-11 ENCOUNTER — Ambulatory Visit: Payer: PPO | Admitting: Physical Therapy

## 2014-10-11 DIAGNOSIS — M25661 Stiffness of right knee, not elsewhere classified: Secondary | ICD-10-CM | POA: Insufficient documentation

## 2014-10-11 DIAGNOSIS — R262 Difficulty in walking, not elsewhere classified: Secondary | ICD-10-CM | POA: Diagnosis not present

## 2014-10-11 DIAGNOSIS — M25551 Pain in right hip: Secondary | ICD-10-CM | POA: Insufficient documentation

## 2014-10-11 DIAGNOSIS — M25651 Stiffness of right hip, not elsewhere classified: Secondary | ICD-10-CM

## 2014-10-11 DIAGNOSIS — M25652 Stiffness of left hip, not elsewhere classified: Secondary | ICD-10-CM

## 2014-10-11 NOTE — Therapy (Signed)
[facility] [address] [place], [place], [address] Phone: [phone]   Fax:  [phone]  Physical Therapy Treatment  Patient Details  Name: [name] MRN: [id number] Date of Birth: 08-21-1924 Referring Provider:  Steele Sizerrissman, Mark A, MD  Encounter Date: 10/11/2014      PT End of Session - 10/11/14 1429    Visit Number 3   Number of Visits 24   Date for PT Re-Evaluation 11/08/14   PT Start Time 1304   PT Stop Time 1352   PT Time Calculation (min) 48 min   Equipment Utilized During Treatment Gait belt   Activity Tolerance Patient tolerated treatment well   Behavior During Therapy [facility] for tasks assessed/performed  Patient asks many questions and is adamant he wants to walk again.      Past Medical History  Diagnosis Date  . Coronary artery disease   . Diabetes mellitus without complication   . Aortic stenosis   . Hypertension   . Hyperlipidemia   . TIA (transient ischemic attack)   . Thyroid disease   . Hypothyroidism     Past Surgical History  Procedure Laterality Date  . Appendectomy    . Hernia repair    . Knee surgery      bilateral  . Eye surgery    . Colonoscopy    . Cardiac catheterization  2010    showed a patient circumflex stent with noncritical CAD and normal right-sided pressures.  . Coronary angioplasty  01/24/2007    stent placement to the mid circumflex   . Knee surgery Left     There were no vitals filed for this visit.  Visit Diagnosis:  Difficulty in walking  Stiffness of hip joint, left  Stiffness of hip joint, right      Subjective Assessment - 10/11/14 1311    Subjective Patient reports he has been using his JAS splints for roughly 4 hours a day, since last Friday. Patient has been attempting to use his LLE daily and would like to walk again if possible.    Pertinent History Parkinson's disease, patient had a left quadriceps tendon rupture and fell after the repair.    Patient Stated Goals To walk again.    Currently in Pain? No/denies            [facility] - 10/11/14 0001    PROM   Left Hip Flexion --  33 degree contracture   Right/Left Knee Right  Right knee flexion contracture of 50 degrees   Left Knee Extension --  60 degree contracture   Right Hip   Right Hip Flexion --  20 degree contacture      Home management/Self-Care  Patient educated extensively on the need for bracing and that he must decrease his contractures prior to attempting ambulation realistically. Patient educated to lay in prone as tolerated throughout the day to stretch hip flexors.  Therapeutic Activities  Patient transferred from wheelchair to mat table via stand-pivot transfer with cga-min A x1 with verbal cuing.  Patient educated on slideboard transfer with cga x 2 from the mat table to the wheelchair. Patient required verbal cuing and physical demonstration to complete slideboard transfer via UEs.   There-Ex Sitting and reaching drills bilaterally with anterolateral weight shifting. Patient was able to come erect independently x 15 bilaterally.  Scooting up and down the mat table x 8 bilaterally with UEs to practice functional mobility training (slideboard transfer).  Push and  pull sitting balance drills x 10 each with cane for 2 sets. Cuing for proper rowing technique (scapular retraction).                        PT Education - 10/11/14 1419    Education provided Yes   Education Details Patient educated to attempt prone laying to decrease hip flexion contractures, and to contact bracing company to check on his utilization of the braces. Patient educated on slideboard transfers,   Person(s) Educated Patient   Methods Explanation;Verbal cues;Demonstration   Comprehension Verbalized understanding             PT Long Term Goals - 10/11/14 1436    PT LONG TERM GOAL #1   Title Patient will be able to stand using a Rw x 5 minutes to  be able to begin walking by 11/08/2014.   Status New   PT LONG TERM GOAL #2   Title Patient will ambulate in parallel bars 10 feet with min assist  by 11/08/2014.   Status New               Plan - 10/11/14 1430    Clinical Impression Statement Patient presents today with bilateral contractures that are consistent in terms of severity today as previous visit. Patient was educated on the need to contact the bracing company to ensure he has been properly using his braces, as it is not biomechanically feasible for him to ambulate currently with his contractures. Patient displayed good sitting balance and core strength during sitting balance and reaching activities. Patient requires additional time for braces to increase his ROM around his hips and knees before PT services for his ambulation will be beneficial. PT will follow up with patient in 1 month to monitor progress.   Pt will benefit from skilled therapeutic intervention in order to improve on the following deficits Abnormal gait;Decreased range of motion;Difficulty walking;Impaired flexibility;Improper body mechanics;Decreased strength;Decreased mobility;Decreased balance;Decreased endurance;Postural dysfunction   Rehab Potential Poor   Clinical Impairments Affecting Rehab Potential Significant contractures bilaterally, patient has Parkinson's and does not seem to have adequate supervision at home to allow for prone laying.    PT Frequency Monthy   PT Treatment/Interventions ADLs/Self Care Home Management;Moist Heat;Balance training;Therapeutic exercise;Therapeutic activities;Wheelchair mobility training;Manual techniques;Patient/family education;Passive range of motion;Neuromuscular re-education;Gait training   PT Next Visit Plan Re-assess for changes in contractures. Examine and advance transfer skills and sitting tolerance as well as tolerance for laying in prone.    PT Home Exercise Plan Prone lying as tolerated.    Recommended Other  Services Occupational therapy may be warranted if patient continues to struggle with bilateral LE contractures.    Consulted and Agree with Plan of Care Patient        Problem List Patient Active Problem List   Diagnosis Date Noted  . Poorly controlled type 2 diabetes mellitus 06/23/2014  . Bed sore on heel, right, unstageable 09/23/2013  . Coronary atherosclerosis of native coronary artery 03/27/2013  . Chronic diastolic CHF (congestive heart failure) 03/27/2013  . Aortic valve stenosis 03/27/2013  . Essential hypertension 03/27/2013  . Hyperlipidemia 03/27/2013  . Atrial fibrillation 03/27/2013  . Bilateral leg edema 03/27/2013  . Constipation 03/27/2013   Kerin RansomPatrick A McNamara, PT, DPT    10/11/2014, 4:22 PM   [place] [facility] [address] [place], [place], [address] Phone: [phone]   Fax:  [phone]

## 2014-10-11 NOTE — Patient Instructions (Signed)
Patient instructed to contact bracing company to have them re-assess his use of his JAS braces due to the lack of progress being seen in decreasing his contractures.

## 2014-11-03 ENCOUNTER — Other Ambulatory Visit: Payer: Self-pay | Admitting: Family Medicine

## 2014-11-11 ENCOUNTER — Ambulatory Visit: Payer: PPO | Attending: Family Medicine

## 2014-11-11 ENCOUNTER — Ambulatory Visit (INDEPENDENT_AMBULATORY_CARE_PROVIDER_SITE_OTHER): Payer: PPO | Admitting: Podiatry

## 2014-11-11 DIAGNOSIS — M79676 Pain in unspecified toe(s): Secondary | ICD-10-CM

## 2014-11-11 DIAGNOSIS — B351 Tinea unguium: Secondary | ICD-10-CM | POA: Diagnosis not present

## 2014-11-11 DIAGNOSIS — M25651 Stiffness of right hip, not elsewhere classified: Secondary | ICD-10-CM

## 2014-11-11 DIAGNOSIS — R262 Difficulty in walking, not elsewhere classified: Secondary | ICD-10-CM

## 2014-11-11 DIAGNOSIS — M25652 Stiffness of left hip, not elsewhere classified: Secondary | ICD-10-CM

## 2014-11-15 NOTE — Progress Notes (Signed)
Patient ID: [name], male   DOB: 04-05-1925, 81 y.o.   MRN: [id number]  Subjective: 81 y.o.-year-old male returns the office today for painful, elongated, thickened toenails which he is unable to trim himself. Denies any redness or drainage around the nails. Denies any acute changes since last appointment and no new complaints today. Denies any systemic complaints such as fevers, chills, nausea, vomiting.   Objective: AAO 3, NAD DP/PT pulses palpable, CRT less than 3 seconds Protective sensation intact with Simms Weinstein monofilament Nails hypertrophic, dystrophic, elongated, brittle, discolored 10. There is tenderness overlying the nails 1-5 bilaterally. There is no surrounding erythema or drainage along the nail sites. No open lesions or pre-ulcerative lesions are identified. No other areas of tenderness bilateral lower extremities. No overlying edema, erythema, increased warmth. No pain with calf compression, swelling, warmth, erythema.  Assessment: Patient presents with symptomatic onychomycosis  Plan: -Treatment options including alternatives, risks, complications were discussed -Nails sharply debrided 10 without complication/bleeding. -Discussed daily foot inspection. If there are any changes, to call the office immediately.  -Follow-up in 3 months or sooner if any problems are to arise. In the meantime, encouraged to call the office with any questions, concerns, changes symptoms.

## 2014-11-25 ENCOUNTER — Telehealth: Payer: Self-pay

## 2014-11-25 NOTE — Telephone Encounter (Signed)
Patient's leg is swelling and bleeding.  Line [phone]

## 2014-11-25 NOTE — Telephone Encounter (Signed)
Red spot on leg poss infection Hot wet soaks and elevation

## 2014-11-30 ENCOUNTER — Ambulatory Visit (INDEPENDENT_AMBULATORY_CARE_PROVIDER_SITE_OTHER): Payer: PPO | Admitting: Family Medicine

## 2014-11-30 ENCOUNTER — Encounter: Payer: Self-pay | Admitting: Family Medicine

## 2014-11-30 VITALS — BP 106/69 | HR 70 | Temp 97.6°F

## 2014-11-30 DIAGNOSIS — L03119 Cellulitis of unspecified part of limb: Secondary | ICD-10-CM | POA: Diagnosis not present

## 2014-11-30 DIAGNOSIS — R600 Localized edema: Secondary | ICD-10-CM | POA: Diagnosis not present

## 2014-11-30 DIAGNOSIS — L02419 Cutaneous abscess of limb, unspecified: Secondary | ICD-10-CM | POA: Diagnosis not present

## 2014-11-30 MED ORDER — MUPIROCIN 2 % EX OINT
1.0000 | TOPICAL_OINTMENT | Freq: Two times a day (BID) | CUTANEOUS | Status: DC
Start: 2014-11-30 — End: 2014-12-17

## 2014-11-30 NOTE — Assessment & Plan Note (Signed)
cont lasix disscuss elevation and compression

## 2014-11-30 NOTE — Progress Notes (Signed)
   BP 106/69 mmHg  Pulse 70  Temp(Src) 97.6 F (36.4 C)  SpO2 99%   Subjective:    Patient ID: [name], male    DOB: 1925/01/26, 81 y.o.   MRN: [id number]  HPI: Chris Russellmer K Brosky is a 81 y.o. male  Chief Complaint  Patient presents with  . Leg Swelling  rt leg cellulitis  Last 2-3 weeks Edema and red some bleeding Wearing compression No fever DM stable Taking lasix breakfast and lunch with good flow   Relevant past medical, surgical, family and social history reviewed and updated as indicated. Interim medical history since our last visit reviewed. Allergies and medications reviewed and updated.  Review of Systems  Constitutional: Negative.   Respiratory: Negative.   Cardiovascular: Negative.     Per HPI unless specifically indicated above     Objective:    BP 106/69 mmHg  Pulse 70  Temp(Src) 97.6 F (36.4 C)  SpO2 99%  Wt Readings from Last 3 Encounters:  06/23/14 195 lb (88.451 kg)  03/23/14 195 lb (88.451 kg)  09/23/13 194 lb (87.998 kg)    Physical Exam  Constitutional: He is oriented to person, place, and time. He appears well-developed and well-nourished. No distress.  HENT:  Head: Normocephalic and atraumatic.  Right Ear: Hearing normal.  Left Ear: Hearing normal.  Nose: Nose normal.  Eyes: Conjunctivae and lids are normal. Right eye exhibits no discharge. Left eye exhibits no discharge. No scleral icterus.  Pulmonary/Chest: Effort normal. No respiratory distress.  Musculoskeletal: Normal range of motion.  Neurological: He is alert and oriented to person, place, and time.  Skin: Skin is intact. No rash noted. There is erythema.  Edema and cellulitis   Psychiatric: He has a normal mood and affect. His speech is normal and behavior is normal. Judgment and thought content normal. Cognition and memory are normal.    Results for orders placed or performed in visit on 03/23/14  Basic Metabolic Panel (BMET)  Result Value Ref Range   Glucose 152  (H) 65 - 99 mg/dL   BUN 22 8 - 27 mg/dL   Creatinine, Ser 4.091.02 0.76 - 1.27 mg/dL   GFR calc non Af Amer 66 >59 mL/min/1.73   GFR calc Af Amer 76 >59 mL/min/1.73   BUN/Creatinine Ratio 22 10 - 22   Sodium 142 134 - 144 mmol/L   Potassium 4.0 3.5 - 5.2 mmol/L   Chloride 104 97 - 108 mmol/L   CO2 21 18 - 29 mmol/L   Calcium 8.9 8.6 - 10.2 mg/dL      Assessment & Plan:   Problem List Items Addressed This Visit      Other   Bilateral leg edema - Primary    cont lasix disscuss elevation and compression       Other Visit Diagnoses    Cellulitis and abscess of leg        rt lower ant inflamed will start bactroban ointment pt ed on wound care given    Relevant Medications    mupirocin ointment (BACTROBAN) 2 %        Follow up plan: Return if symptoms worsen or fail to improve, for and reg visit.

## 2014-12-07 ENCOUNTER — Ambulatory Visit (INDEPENDENT_AMBULATORY_CARE_PROVIDER_SITE_OTHER): Payer: PPO | Admitting: Cardiovascular Disease

## 2014-12-07 ENCOUNTER — Encounter: Payer: Self-pay | Admitting: Cardiovascular Disease

## 2014-12-07 VITALS — BP 110/68 | HR 77 | Ht 72.0 in | Wt 194.0 lb

## 2014-12-07 DIAGNOSIS — I5032 Chronic diastolic (congestive) heart failure: Secondary | ICD-10-CM

## 2014-12-07 DIAGNOSIS — I10 Essential (primary) hypertension: Secondary | ICD-10-CM

## 2014-12-07 DIAGNOSIS — I2510 Atherosclerotic heart disease of native coronary artery without angina pectoris: Secondary | ICD-10-CM

## 2014-12-07 DIAGNOSIS — E119 Type 2 diabetes mellitus without complications: Secondary | ICD-10-CM | POA: Diagnosis not present

## 2014-12-07 DIAGNOSIS — I4891 Unspecified atrial fibrillation: Secondary | ICD-10-CM

## 2014-12-07 DIAGNOSIS — E1165 Type 2 diabetes mellitus with hyperglycemia: Secondary | ICD-10-CM

## 2014-12-07 DIAGNOSIS — I350 Nonrheumatic aortic (valve) stenosis: Secondary | ICD-10-CM | POA: Diagnosis not present

## 2014-12-07 DIAGNOSIS — E785 Hyperlipidemia, unspecified: Secondary | ICD-10-CM

## 2014-12-07 NOTE — Progress Notes (Signed)
Patient ID: [name], male    DOB: Oct 14, 1924, 81 y.o.   MRN: [id number]  HPI Comments: [name] is an 81 year old gentleman with history of CAD, PCI of the circumflex in 2002, catheterization in 2010 showing patent circumflex and noncritical CAD with normal right-sided pressures, ejection fraction 50-55% April 2014 with mild aortic valve stenosis, gradient 2.26 m/s, chronic lower extremity swelling that per the notes has had a history of atrial fibrillation previously on pradaxa 150 mg daily, high fall risk who presents for routine followup  Of his coronary artery disease He has a history of Parkinson's He presents today with his son  left leg surgery performed by Dr. Ernest Pine.   In follow-up today, he reports having severe right leg pain. Details are unclear though he reports it is his entire leg, extending up to his right hip.  Son reports this is not a new issue as he sits all day, Lasix down at nighttime and the stretching of being supine causes the pain. Patient reports the pain is now severe. Has stable lower extremity edema, sore on his right lower extremity Records have been requested from vein and vascular  EKG on today's visit shows normal sinus rhythm with rate 79 bpm, no significant ST or T wave changes   Other past medical history He does not like to wear compression hose.  Legs are down most of the day, has chronic leg edema.  He does report having some shortness of breath at nighttime, wakes up ,  Symptomatic, symptoms resolve after several seconds. He reports having diagnoses of sleep apnea but does not wear CPAP.  Sits most of the day in the chair, no longer walking secondary to chronic knee pain. in the past was bothered by frequency of urination.  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 amiodarone (PACERONE) 200 MG tablet Take 0.5 tablets (100 mg total) by mouth 2 (two) times daily.  . carbidopa-levodopa (SINEMET IR) 25-100 MG per tablet Take 1 tablet by mouth 4 (four) times daily.  . Carbidopa-Levodopa ER (SINEMET CR) 25-100 MG tablet controlled release Take 1 tablet by mouth 2 (two) times daily.  . clopidogrel (PLAVIX) 75 MG tablet Take 1 tablet (75 mg total) by mouth daily.  Marland Kitchen docusate sodium (COLACE) 100 MG capsule Take 100 mg by mouth 4 (four) times daily.  . furosemide (LASIX) 20 MG tablet Take 1 tablet (20 mg total) by mouth 2 (two) times daily as needed.  Marland Kitchen JANUVIA 100 MG tablet TAKE 1 TABLET EVERY DAY  . levothyroxine (SYNTHROID, LEVOTHROID) 175 MCG tablet   . mupirocin ointment (BACTROBAN) 2 % Place 1 application into the nose 2 (two) times daily. Apply to leg lesion 2 times a day  . omeprazole (PRILOSEC) 20 MG capsule Take 20 mg by mouth daily.  . potassium chloride (K-DUR) 10 MEQ tablet Take 1 tablet (10 mEq total) by mouth every morning.  . potassium chloride (K-DUR,KLOR-CON) 10 MEQ tablet TAKE ONE TABLET EVERY MORNING  . pramipexole (  MIRAPEX) 1 MG tablet Take 1/2 tablet four times daily.  . tamsulosin (FLOMAX) 0.4 MG CAPS capsule Take 1 capsule (0.4 mg total) by mouth daily after supper.   No facility-administered encounter medications on file as of 12/07/2014.    Past Medical History  Diagnosis Date  . Coronary artery disease   . Diabetes mellitus without complication   . Aortic stenosis   . Hypertension   . Hyperlipidemia   . TIA (transient ischemic attack)   . Thyroid disease   . Hypothyroidism     Past Surgical History  Procedure Laterality Date  .  Appendectomy    . Hernia repair    . Knee surgery      bilateral  . Eye surgery    . Colonoscopy    . Cardiac catheterization  2010    showed a patient circumflex stent with noncritical CAD and normal right-sided pressures.  . Coronary angioplasty  01/24/2007    stent placement to the mid circumflex   . Knee surgery Left     Social History  reports that he has never smoked. He has never used smokeless tobacco. He reports that he does not drink alcohol or use illicit drugs.  Family History family history includes Heart attack (age of onset: 4239) in his brother; Heart attack (age of onset: 6962) in his father; Heart disease in his mother.   Review of Systems  Constitutional: Negative.   Eyes: Negative.   Respiratory: Negative.   Cardiovascular: Positive for leg swelling.  Gastrointestinal: Negative.   Musculoskeletal: Positive for gait problem.        Profound leg weakness , knee pain bilaterally Right hip pain  Skin: Negative.   Neurological: Negative.   Hematological: Negative.   Psychiatric/Behavioral: Negative.   All other systems reviewed and are negative.  BP 110/68 mmHg  Pulse 77  Ht 6' (1.829 m)  Wt 194 lb (87.998 kg)  BMI 26.31 kg/m2  Physical Exam  Constitutional: He is oriented to person, place, and time. He appears well-developed and well-nourished.  Presents in a wheelchair  HENT:  Head: Normocephalic.  Nose: Nose normal.  Mouth/Throat: Oropharynx is clear and moist.  Eyes: Conjunctivae are normal. Pupils are equal, round, and reactive to light.  Neck: Normal range of motion. Neck supple. No JVD present.  Cardiovascular: Normal rate, regular rhythm, S1 normal, S2 normal and intact distal pulses.  Exam reveals no gallop and no friction rub.   Murmur heard.  Systolic murmur is present with a grade of 2/6  1+ pitting edema to the midshin bilaterally lower extremity  Pulmonary/Chest: Effort normal and breath sounds normal. No respiratory distress. He has no  wheezes. He has no rales. He exhibits no tenderness.  Scant crackles at the bases bilaterally with deep inspiration  Abdominal: Soft. Bowel sounds are normal. He exhibits no distension. There is no tenderness.  Musculoskeletal: Normal range of motion. He exhibits no edema or tenderness.  Lymphadenopathy:    He has no cervical adenopathy.  Neurological: He is alert and oriented to person, place, and time. Coordination normal.  Skin: Skin is warm and dry. No rash noted. No erythema.  Psychiatric: He has a normal mood and affect. His behavior is normal. Judgment and thought content normal.      Assessment and Plan   Nursing note and vitals reviewed.

## 2014-12-07 NOTE — Assessment & Plan Note (Signed)
Currently not on a statin secondary to profound leg weakness

## 2014-12-07 NOTE — Assessment & Plan Note (Signed)
Currently with no symptoms of angina. No further workup at this time. Continue current medication regimen. 

## 2014-12-07 NOTE — Patient Instructions (Signed)
You are doing well. No medication changes were made.  Please call us if you have new issues that need to be addressed before your next appt.  Your physician wants you to follow-up in: 6 months.  You will receive a reminder letter in the mail two months in advance. If you don't receive a letter, please call our office to schedule the follow-up appointment.   

## 2014-12-07 NOTE — Assessment & Plan Note (Signed)
Maintaining normal sinus rhythm on his current medications. No medication changes made

## 2014-12-07 NOTE — Assessment & Plan Note (Signed)
Mild degree of stenosis, medical management at this time

## 2014-12-07 NOTE — Assessment & Plan Note (Signed)
Likely contributing to his poor wound healing Unable to exercise. Will defer to primary care for medical management

## 2014-12-07 NOTE — Assessment & Plan Note (Signed)
Blood pressure is well controlled on today's visit. No changes made to the medications. 

## 2014-12-13 NOTE — Therapy (Signed)
[facility] MAIN [facility] SERVICES [address] [place], [place], [address] Phone: [phone]   Fax:  [phone]  Physical Therapy Treatment  Patient Details  Name: [name] MRN: [id number] Date of Birth: 1924-07-29 Referring Provider:  Steele Sizer, MD  Encounter Date: 11/11/2014      PT End of Session - 12/13/14 1653    Visit Number 4   Number of Visits 24   Date for PT Re-Evaluation 12/09/14   Authorization Type 1/10   Equipment Utilized During Treatment Gait belt   Activity Tolerance Patient tolerated treatment well   Behavior During Therapy [facility] for tasks assessed/performed  Patient asks many questions and is adamant he wants to walk again.      Past Medical History  Diagnosis Date  . Coronary artery disease   . Diabetes mellitus without complication   . Aortic stenosis   . Hypertension   . Hyperlipidemia   . TIA (transient ischemic attack)   . Thyroid disease   . Hypothyroidism     Past Surgical History  Procedure Laterality Date  . Appendectomy    . Hernia repair    . Knee surgery      bilateral  . Eye surgery    . Colonoscopy    . Cardiac catheterization  2010    showed a patient circumflex stent with noncritical CAD and normal right-sided pressures.  . Coronary angioplasty  01/24/2007    stent placement to the mid circumflex   . Knee surgery Left     There were no vitals filed for this visit.  Visit Diagnosis:  Difficulty in walking - Plan: PT plan of care cert/re-cert  Stiffness of hip joint, left - Plan: PT plan of care cert/re-cert  Stiffness of hip joint, right - Plan: PT plan of care cert/re-cert      Subjective Assessment - 11/11/14 1651    Subjective pt reports the JAS rep came to his home where he discovered that he was not using the splints correctly. pt reports he now feels confident using the braces and hopes he is making progress. pt reports he tried to lay prone as previous PT session  recommended, but he feels he is not able to do this on his own.    Pertinent History Parkinson's disease, patient had a left quadriceps tendon rupture and fell after the repair.    Patient Stated Goals To walk again.      Therex: WC management: pt educated on how to remove the arm-rest of the WC to aid in safe squat/pivot transfer from [facility] to mat table. Pt preformed this x 4 with don/doff WC arm rest. Pt required mod- progressing to minimal cues for this task.  Knee ROM following gentle passive stretching / contract relax on the hamstrings: L knee 55-WNL degrees, R knee 30-WNL degrees Pt able to stand x 45s with RW and +1 mod assist for sit to stand. Pt then preformed sit to stand with RW +1 min-mod assist x 3, cues for hand placement and weight baring through the LLE. Pt has is very anxious and has difficulty baring weight through the LLE                             PT Education - 11/11/14 1652    Education provided Yes   Education Details continue using JAS splints to maximize knee extension, as current level of flexion contractures, although improved,  is still not condusive for ambulation.    Person(s) Educated Patient   Methods Explanation   Comprehension Verbalized understanding             PT Long Term Goals - 11/11/14 1641    PT LONG TERM GOAL #1   Title Patient will be able to stand using a Rw x 2min minutes to be able to begin walking   Time 8   Period Weeks   Status On-going   PT LONG TERM GOAL #2   Title Patient will ambulate in parallel bars 10 feet with min assist     Time 8   Period Weeks   Status On-going   PT LONG TERM GOAL #3   Title pt will preform stand pivot transfer using RW independently needing min cues or less.    Time 8   Period Weeks   Status New   PT LONG TERM GOAL #4   Title pt will demonstrate 25deg or less flexion contracture of the knees to allow for functional standing/abulation.    Time 8   Period Weeks   Status New                Plan - 11/11/14 1653    Clinical Impression Statement pt presents today with bilateral hip and knee flexion contractures which demonstrate some improvement since last assessment. pt had improvement of R knee extension vs the L side. pt current knee ROM is: Left 55-WNL degrees, R 30-WNL degrees. PT again recommends additional time with JAS devices to maximize knee ROM before gait training is attempted as still at this point functional ambulation wout not be possible with this amount of knee contracture. pt will follow up with PT in 1 month for reassessment of ROM as he is now using the JAS splinting properly.    Pt will benefit from skilled therapeutic intervention in order to improve on the following deficits Abnormal gait;Decreased range of motion;Difficulty walking;Impaired flexibility;Improper body mechanics;Decreased strength;Decreased mobility;Decreased balance;Decreased endurance;Postural dysfunction   Rehab Potential Fair   Clinical Impairments Affecting Rehab Potential Significant contractures bilaterally, patient has Parkinson's and does not seem to have adequate supervision at home to allow for prone laying.    PT Frequency Monthy   PT Duration 8 weeks   PT Treatment/Interventions ADLs/Self Care Home Management;Moist Heat;Balance training;Therapeutic exercise;Therapeutic activities;Wheelchair mobility training;Manual techniques;Patient/family education;Passive range of motion;Neuromuscular re-education;Gait training   PT Next Visit Plan Re-assess for changes in contractures. Examine and advance transfer skills and sitting tolerance as well as tolerance for laying in prone.    Consulted and Agree with Plan of Care Patient          G-Codes - 11/11/14 1643    Functional Limitation Mobility: Walking and moving around   Mobility: Walking and Moving Around Current Status [phone] At least 80 percent but less than 100 percent impaired, limited or restricted   Mobility: Walking  and Moving Around Goal Status [phone]) At least 40 percent but less than 60 percent impaired, limited or restricted      Problem List Patient Active Problem List   Diagnosis Date Noted  . Poorly controlled type 2 diabetes mellitus 06/23/2014  . Bed sore on heel, right, unstageable 09/23/2013  . Coronary atherosclerosis of native coronary artery 03/27/2013  . Chronic diastolic CHF (congestive heart failure) 03/27/2013  . Aortic valve stenosis 03/27/2013  . Essential hypertension 03/27/2013  . Hyperlipidemia 03/27/2013  . Atrial fibrillation 03/27/2013  . Bilateral leg edema 03/27/2013  . Constipation 03/27/2013  Carlyon Shadow. Lorraina Spring, PT, DPT [phone]  Devarius Nelles 11/11/14, 4:55 PM  [facility] MAIN [facility] SERVICES [address] [place], [place], [address] Phone: [phone]   Fax:  [phone]

## 2014-12-17 ENCOUNTER — Other Ambulatory Visit: Payer: Self-pay | Admitting: Family Medicine

## 2014-12-21 ENCOUNTER — Ambulatory Visit: Payer: PPO | Attending: Orthopedic Surgery

## 2014-12-21 DIAGNOSIS — R262 Difficulty in walking, not elsewhere classified: Secondary | ICD-10-CM | POA: Diagnosis present

## 2014-12-21 DIAGNOSIS — M25651 Stiffness of right hip, not elsewhere classified: Secondary | ICD-10-CM | POA: Diagnosis not present

## 2014-12-21 DIAGNOSIS — M25652 Stiffness of left hip, not elsewhere classified: Secondary | ICD-10-CM | POA: Diagnosis not present

## 2014-12-21 NOTE — Patient Instructions (Addendum)
Recommended WC push ups to aid with transfers.  Also recommended, instructed and supervised log roll to aid with supine<>sit transfer.

## 2014-12-21 NOTE — Therapy (Signed)
[place] MAIN [facility] SERVICES [address] [place], [place], [address] Phone: [phone]   Fax:  [phone]  Physical Therapy Treatment/Discharge summary  Patient Details  Name: [name] MRN: [id number] Date of Birth: 1924-07-02 Referring Provider:  Dereck Leep, MD  Encounter Date: 12/21/2014      PT End of Session - 12/21/14 1704    Visit Number 5   Number of Visits 24   Date for PT Re-Evaluation 12/09/14   Authorization Type 1/10   PT Start Time 1550   PT Stop Time 1630   PT Time Calculation (min) 40 min   Equipment Utilized During Treatment Gait belt   Activity Tolerance Patient tolerated treatment well   Behavior During Therapy [facility] for tasks assessed/performed  Patient asks many questions and is adamant he wants to walk again.      Past Medical History  Diagnosis Date  . Coronary artery disease   . Diabetes mellitus without complication   . Aortic stenosis   . Hypertension   . Hyperlipidemia   . TIA (transient ischemic attack)   . Thyroid disease   . Hypothyroidism     Past Surgical History  Procedure Laterality Date  . Appendectomy    . Hernia repair    . Knee surgery      bilateral  . Eye surgery    . Colonoscopy    . Cardiac catheterization  2010    showed a patient circumflex stent with noncritical CAD and normal right-sided pressures.  . Coronary angioplasty  01/24/2007    stent placement to the mid circumflex   . Knee surgery Left     There were no vitals filed for this visit.  Visit Diagnosis:  Difficulty in walking  Stiffness of hip joint, left  Stiffness of hip joint, right      Subjective Assessment - 12/21/14 1700    Subjective pt reports he uses his JAS splint 4 hours daily, but he cannot tell a difference in his knee Range of motion. pt reports having an infection on his leg 2 weeks ago which was very painful. pt reports he spends 8+ hours in his manual WC /day.    Pertinent  History Parkinson's disease, patient had a left quadriceps tendon rupture and fell after the repair.    Patient Stated Goals To walk again.    Currently in Pain? No/denies      PT assessed ability to transfer from WC/plinth, and back. SBA for safety. Pt was slow,  But independent with this.  Min-mod cues for log roll for supine><sit transfer x 2 WC push up x 5 to promote improved ability to transfer with improved UE strength Knee ROM: R knee 60-125 deg, L knee 40-125 deg  extensive education today regarding lack of progress despite extensive conservative treatment to treat knee flexion contractures. due to this pt will not be able to functionally ambulate. also discussed importance of optimal positioning to prevent skin break down, optimize circulation, aid posutre and maximize mobility. discussed recommendation of power mobility device with optimal seating.                           PT Education - 12/21/14 1701    Education provided Yes   Education Details extensive education today regarding lack of progress despite extensive conservative treatment to treat knee flexion contractures. due to this pt will not be able to functionally ambulate. also discussed importance  of optimal positioning to prevent skin break down, optimize circulation, aid posutre and maximize mobility. discussed recommendation of power mobility device with optimal seating.    Person(s) Educated Patient   Methods Explanation   Comprehension Verbalized understanding             PT Long Term Goals - 01/16/2015 1711    PT LONG TERM GOAL #1   Title Patient will be able to stand using a Rw x 53mn minutes to be able to begin walking   Time 8   Period Weeks   Status Not Met   PT LONG TERM GOAL #2   Title Patient will ambulate in parallel bars 10 feet with min assist     Time 8   Period Weeks   Status Not Met   PT LONG TERM GOAL #3   Title pt will preform stand pivot transfer using RW  independently needing min cues or less.    Time 8   Period Weeks   Status Not Met   PT LONG TERM GOAL #4   Title pt will demonstrate 25deg or less flexion contracture of the knees to allow for functional standing/abulation.    Baseline L knee 60-125 deg; R knee 40-125 deg in supine   Time 8   Period Weeks   Status Not Met               Plan - 008/14/161705    Clinical Impression Statement pt does not show any progress regarding reduction of his knee contractures despite his reports of JAS splint compliance. pt also previously failed to make significant progress in past physical therapy episode to reduce his knee contractures (therex/manual therapy) to aid with future gait training. However, unfortuanately, due to pts significant hamstring contractures functional ambulation is not possible at this time. pt has not made progress towards goals. He is independent with slide transfer from his chair to the mat table witht the removable arm rest. Due to pts substantial immobility, [facility] pt does have a history of incontinence and some skin issues. he does report having hip / back pain and has bilateral leg edema with recent wound/infection. At this time pt will be discharged from outpatient physical therapy, however a recommendation/evaluation for a custom power mobility device with a pressure relieving cushion should be considered to aid with posture, positioning, transfers, mobility and reduce  risk for skin break down. pt was recommended to return to referring MD as conservative trreatment is not showing progress at this time.    Pt will benefit from skilled therapeutic intervention in order to improve on the following deficits Abnormal gait;Decreased range of motion;Difficulty walking;Impaired flexibility;Improper body mechanics;Decreased strength;Decreased mobility;Decreased balance;Decreased endurance;Postural dysfunction   Rehab Potential Fair   Clinical  Impairments Affecting Rehab Potential Significant contractures bilaterally, patient has Parkinson's and does not seem to have adequate supervision at home to allow for prone laying.    PT Frequency Monthy   PT Duration 8 weeks   PT Treatment/Interventions ADLs/Self Care Home Management;Moist Heat;Balance training;Therapeutic exercise;Therapeutic activities;Wheelchair mobility training;Manual techniques;Patient/family education;Passive range of motion;Neuromuscular re-education;Gait training   PT Next Visit Plan Re-assess for changes in contractures. Examine and advance transfer skills and sitting tolerance as well as tolerance for laying in prone.    Consulted and Agree with Plan of Care Patient          G-Codes - 0Aug 14, 20161712    Functional Limitation Mobility: Walking and moving around  Mobility: Walking and Moving Around Current Status [phone]) At least 80 percent but less than 100 percent impaired, limited or restricted   Mobility: Walking and Moving Around Goal Status [phone]) At least 40 percent but less than 60 percent impaired, limited or restricted   Mobility: Walking and Moving Around Discharge Status [phone]) At least 80 percent but less than 100 percent impaired, limited or restricted      Problem List Patient Active Problem List   Diagnosis Date Noted  . Poorly controlled type 2 diabetes mellitus 06/23/2014  . Bed sore on heel, right, unstageable 09/23/2013  . Coronary atherosclerosis of native coronary artery 03/27/2013  . Chronic diastolic CHF (congestive heart failure) 03/27/2013  . Aortic valve stenosis 03/27/2013  . Essential hypertension 03/27/2013  . Hyperlipidemia 03/27/2013  . Atrial fibrillation 03/27/2013  . Bilateral leg edema 03/27/2013  . Constipation 03/27/2013   [name], PT, DPT [phone]  [name] 12/21/2014, 5:13 PM  [place] MAIN [facility] SERVICES [address] [place], [place], [address] Phone:  [phone]   Fax:  [phone]

## 2014-12-24 ENCOUNTER — Encounter: Payer: Self-pay | Admitting: Cardiovascular Disease

## 2014-12-27 ENCOUNTER — Encounter: Payer: Self-pay | Admitting: Cardiovascular Disease

## 2015-01-07 ENCOUNTER — Telehealth: Payer: Self-pay | Admitting: Family Medicine

## 2015-01-07 NOTE — Telephone Encounter (Signed)
Spoke to pt about pain in his leg and tried to make appt but he had to call transportation first and will call back when he is able to come

## 2015-01-10 ENCOUNTER — Encounter: Payer: Self-pay | Admitting: Unknown Physician Specialty

## 2015-01-10 ENCOUNTER — Ambulatory Visit (INDEPENDENT_AMBULATORY_CARE_PROVIDER_SITE_OTHER): Payer: PPO | Admitting: Unknown Physician Specialty

## 2015-01-10 VITALS — BP 119/74 | HR 82 | Temp 97.4°F

## 2015-01-10 DIAGNOSIS — L03119 Cellulitis of unspecified part of limb: Secondary | ICD-10-CM

## 2015-01-10 DIAGNOSIS — L02419 Cutaneous abscess of limb, unspecified: Secondary | ICD-10-CM | POA: Diagnosis not present

## 2015-01-10 MED ORDER — DOXYCYCLINE HYCLATE 100 MG PO TABS: 100.0000 mg | ORAL_TABLET | Freq: Two times a day (BID) | ORAL | Status: DC

## 2015-01-10 MED ORDER — MUPIROCIN 2 % EX OINT: TOPICAL_OINTMENT | Freq: Three times a day (TID) | CUTANEOUS | Status: DC

## 2015-01-10 NOTE — Progress Notes (Signed)
   BP 119/74 mmHg  Pulse 82  Temp(Src) 97.4 F (36.3 C)  SpO2 98%   Subjective:    Patient ID: [name], male    DOB: 1925/02/24, 81 y.o.   MRN: [id number]  HPI: [name] is a 81 y.o. male  Chief Complaint  Patient presents with  . Cellulitis    pt was seen last month by Dr. Dossie Arbour for cellulitis on right leg and was told to come back if it did not improve   Pt has sores on right leg with cellulitis seen by Dr Dossie Arbour about 1 month ago.  No improvement.  He was given an ointment which helped clear it up but has 2 more.  It did bleed at night.  He is using the cream and a refill.  He has been to the wound clinic and had surgery to vein and vascular.     Relevant past medical, surgical, family and social history reviewed and updated as indicated. Interim medical history since our last visit reviewed. Allergies and medications reviewed and updated.  Review of Systems  Constitutional: Negative.   Respiratory: Negative.   Cardiovascular: Negative.     Per HPI unless specifically indicated above     Objective:    BP 119/74 mmHg  Pulse 82  Temp(Src) 97.4 F (36.3 C)  SpO2 98%  Wt Readings from Last 3 Encounters:  12/07/14 194 lb (87.998 kg)  06/23/14 195 lb (88.451 kg)  03/23/14 195 lb (88.451 kg)    Physical Exam  Constitutional: He is oriented to person, place, and time. He appears well-developed and well-nourished. No distress.  HENT:  Head: Normocephalic and atraumatic.  Right Ear: Hearing normal.  Left Ear: Hearing normal.  Nose: Nose normal.  Eyes: Conjunctivae and lids are normal. Right eye exhibits no discharge. Left eye exhibits no discharge. No scleral icterus.  Cardiovascular: Normal rate and normal heart sounds.   Pulmonary/Chest: Effort normal and breath sounds normal. No respiratory distress.  Musculoskeletal: Normal range of motion.  Neurological: He is alert and oriented to person, place, and time.  Skin: Skin is warm, dry and intact.  No rash noted. There is erythema.  Right lower leg edema, decubitus and erythema    Psychiatric: He has a normal mood and affect. His speech is normal and behavior is normal. Judgment and thought content normal. Cognition and memory are normal.  Nursing note and vitals reviewed.      Assessment & Plan:   Problem List Items Addressed This Visit    None    Visit Diagnoses    Cellulitis and abscess of leg    -  Primary    Schedule appt with vein and vascular.  Refer to the wound clinic.  Will rx Bactroban and oral antibiotics.      Relevant Orders    Ambulatory referral to Vascular Surgery    Ambulatory referral to Pain Clinic        Follow up plan: Return if symptoms worsen or fail to improve.

## 2015-01-14 ENCOUNTER — Encounter: Payer: PPO | Attending: Surgery | Admitting: Surgery

## 2015-01-14 DIAGNOSIS — E11622 Type 2 diabetes mellitus with other skin ulcer: Secondary | ICD-10-CM | POA: Insufficient documentation

## 2015-01-14 DIAGNOSIS — I10 Essential (primary) hypertension: Secondary | ICD-10-CM | POA: Diagnosis not present

## 2015-01-14 DIAGNOSIS — N400 Enlarged prostate without lower urinary tract symptoms: Secondary | ICD-10-CM | POA: Diagnosis not present

## 2015-01-14 DIAGNOSIS — E1151 Type 2 diabetes mellitus with diabetic peripheral angiopathy without gangrene: Secondary | ICD-10-CM | POA: Insufficient documentation

## 2015-01-14 DIAGNOSIS — L97512 Non-pressure chronic ulcer of other part of right foot with fat layer exposed: Secondary | ICD-10-CM | POA: Insufficient documentation

## 2015-01-14 DIAGNOSIS — L97212 Non-pressure chronic ulcer of right calf with fat layer exposed: Secondary | ICD-10-CM | POA: Insufficient documentation

## 2015-01-14 DIAGNOSIS — I890 Lymphedema, not elsewhere classified: Secondary | ICD-10-CM | POA: Diagnosis not present

## 2015-01-15 NOTE — Progress Notes (Signed)
MAXXON, SCHWANKE ([id number]) Visit Report for 01/14/2015 Allergy List Details Patient Name: [name], [name]. Date of Service: 01/14/2015 2:30 PM Medical Record Number: [id number] Patient Account Number: [id number] Date of Birth/Sex: 08/20/1924 (81 y.o. Male) Treating RN: Clover Mealy, RN, BSN, [place] Sink Primary Care Physician: Vonita Moss Other Clinician: Referring Physician: Vonita Moss Treating Physician/Extender: Rudene Re in Treatment: 0 Allergies Active Allergies No Known Drug Allergies Allergy Notes Electronic Signature(s) Signed: 01/14/2015 2:53:29 PM By: Elpidio Eric BSN, RN Entered By: Elpidio Eric on 01/14/2015 14:53:29 [name], [name] ([id number]) -------------------------------------------------------------------------------- Arrival Information Details Patient Name: Chris Riding K. Date of Service: 01/14/2015 2:30 PM Medical Record Number: [id number] Patient Account Number: [id number] Date of Birth/Sex: [birth year] (81 y.o. Male) Treating RN: Clover Mealy, RN, BSN, [place] Sink Primary Care Physician: Vonita Moss Other Clinician: Referring Physician: Vonita Moss Treating Physician/Extender: Rudene Re in Treatment: 0 Visit Information Patient Arrived: Wheel Chair Arrival Time: 14:37 Accompanied By: son Transfer Assistance: Manual Patient Identification Verified: Yes Secondary Verification Process Yes Completed: Patient Requires Transmission-Based No Precautions: Patient Has Alerts: No History Since Last Visit Any new allergies or adverse reactions: No Had a fall or experienced change in activities of daily living that may affect risk of falls: No Signs or symptoms of abuse/neglect since last visito No Hospitalized since last visit: No Has Dressing in Place as Prescribed: Yes Electronic Signature(s) Signed: 01/14/2015 2:42:57 PM By: Elpidio Eric BSN, RN Entered By: Elpidio Eric on 01/14/2015 14:42:57 [name], [name]  ([id number]) -------------------------------------------------------------------------------- Clinic Level of Care Assessment Details Patient Name: Chris Riding K. Date of Service: 01/14/2015 2:30 PM Medical Record Number: [id number] Patient Account Number: [id number] Date of Birth/Sex: 05-Mar-1925 (81 y.o. Male) Treating RN: Clover Mealy, RN, BSN, [place] Sink Primary Care Physician: Vonita Moss Other Clinician: Referring Physician: Vonita Moss Treating Physician/Extender: Rudene Re in Treatment: 0 Clinic Level of Care Assessment Items TOOL 2 Quantity Score []  - Use when only an EandM is performed on the INITIAL visit 0 ASSESSMENTS - Nursing Assessment / Reassessment X - General Physical Exam (combine w/ comprehensive assessment (listed just 1 20 below) when performed on new pt. evals) X - Comprehensive Assessment (HX, ROS, Risk Assessments, Wounds Hx, etc.) 1 25 ASSESSMENTS - Wound and Skin Assessment / Reassessment X - Simple Wound Assessment / Reassessment - one wound 1 5 []  - Complex Wound Assessment / Reassessment - multiple wounds 0 []  - Dermatologic / Skin Assessment (not related to wound area) 0 ASSESSMENTS - Ostomy and/or Continence Assessment and Care []  - Incontinence Assessment and Management 0 []  - Ostomy Care Assessment and Management (repouching, etc.) 0 PROCESS - Coordination of Care X - Simple Patient / Family Education for ongoing care 1 15 []  - Complex (extensive) Patient / Family Education for ongoing care 0 []  - Staff obtains Chiropractor, Records, Test Results / Process Orders 0 []  - Staff telephones HHA, Nursing Homes / Clarify orders / etc 0 []  - Routine Transfer to another Facility (non-emergent condition) 0 []  - Routine Hospital Admission (non-emergent condition) 0 []  - New Admissions / Manufacturing engineer / Ordering NPWT, Apligraf, etc. 0 []  - Emergency Hospital Admission (emergent condition) 0 []  - Simple Discharge Coordination 0 [name], [name]  ([id number]) []  - Complex (extensive) Discharge Coordination 0 PROCESS - Special Needs []  - Pediatric / Minor Patient Management 0 []  - Isolation Patient Management 0 []  - Hearing / Language / Visual special needs 0 []  - Assessment of Community assistance (transportation, D/C planning, etc.) 0 []  - Additional assistance / Altered mentation  0  - Support Surface(s) Assessment (bed, cushion, seat, etc.) 0 INTERVENTIONS - Wound Cleansing / Measurement X - Wound Imaging (photographs - any number of wounds) 1 5  - Wound Tracing (instead of photographs) 0  - Simple Wound Measurement - one wound 0 X - Complex Wound Measurement - multiple wounds 2 5 X - Simple Wound Cleansing - one wound 1 5  - Complex Wound Cleansing - multiple wounds 0 INTERVENTIONS - Wound Dressings X - Small Wound Dressing one or multiple wounds 2 10  - Medium Wound Dressing one or multiple wounds 0  - Large Wound Dressing one or multiple wounds 0  - Application of Medications - injection 0 INTERVENTIONS - Miscellaneous  - External ear exam 0  - Specimen Collection (cultures, biopsies, blood, body fluids, etc.) 0  - Specimen(s) / Culture(s) sent or taken to Lab for analysis 0  - Patient Transfer (multiple staff / Nurse, adult / Similar devices) 0  - Simple Staple / Suture removal (25 or less) 0  - Complex Staple / Suture removal (26 or more) 0 [name], [name] ([id number])  - Hypo / Hyperglycemic Management (close monitor of Blood Glucose) 0 X - Ankle / Brachial Index (ABI) - do not check if billed separately 1 15 Has the patient been seen at the hospital within the last three years: Yes Total Score: 120 Level Of Care: New/Established - Level 4 Electronic Signature(s) Signed: 01/14/2015 3:34:38 PM By: Elpidio Eric BSN, RN Entered By: Elpidio Eric on 01/14/2015 15:34:38 [name], [name] ([id number]) -------------------------------------------------------------------------------- Encounter  Discharge Information Details Patient Name: Chris Riding K. Date of Service: 01/14/2015 2:30 PM Medical Record Number: [id number] Patient Account Number: [id number] Date of Birth/Sex: 1924/10/24 (81 y.o. Male) Treating RN: Clover Mealy, RN, BSN, [place] Sink Primary Care Physician: Vonita Moss Other Clinician: Referring Physician: Vonita Moss Treating Physician/Extender: Rudene Re in Treatment: 0 Encounter Discharge Information Items Discharge Pain Level: 0 Discharge Condition: Stable Ambulatory Status: Wheelchair Discharge Destination: Home Transportation: Other Accompanied By: son Schedule Follow-up Appointment: No Medication Reconciliation completed and provided to Patient/Care No [name]: Provided on Clinical Summary of Care: 01/14/2015 Form Type Recipient Paper Patient [facility] Electronic Signature(s) Signed: 01/14/2015 3:54:39 PM By: Francie Massing Previous Signature: 01/14/2015 3:35:32 PM Version By: Elpidio Eric BSN, RN Entered By: Francie Massing on 01/14/2015 15:54:39 Deridder, [name] ([id number]) -------------------------------------------------------------------------------- Lower Extremity Assessment Details Patient Name: Chris Riding K. Date of Service: 01/14/2015 2:30 PM Medical Record Number: [id number] Patient Account Number: [id number] Date of Birth/Sex: [birth year] (81 y.o. Male) Treating RN: Clover Mealy, RN, BSN, [place] Primary Care Physician: Vonita Moss Other Clinician: Referring Physician: Vonita Moss Treating Physician/Extender: Rudene Re in Treatment: 0 Edema Assessment Assessed: [Left: No] [Right: No] Edema: [Left: Yes] [Right: Yes] Calf Left: Right: Point of Measurement: 32 cm From Medial Instep 36.6 cm 37.8 cm Ankle Left: Right: Point of Measurement: 9 cm From Medial Instep 26.6 cm 27.6 cm Vascular Assessment Pulses: Posterior Tibial Palpable: [Left:No] [Right:No] Doppler: [Left:Monophasic] [Right:Monophasic] Dorsalis Pedis Palpable:  [Left:No] [Right:No] Doppler: [Left:Monophasic] [Right:Monophasic] Extremity colors, hair growth, and conditions: Extremity Color: [Left:Mottled] [Right:Red] Hair Growth on Extremity: [Left:Yes] [Right:Yes] Temperature of Extremity: [Left:Warm] [Right:Warm] Capillary Refill: [Left:< 3 seconds] [Right:< 3 seconds] Blood Pressure: Brachial: [Left:122] Dorsalis Pedis: 70 [Left:Dorsalis Pedis: 98] Ankle: Posterior Tibial: [Left:Posterior Tibial: 0.57] [Right:0.80] Toe Nail Assessment Left: Right: Thick: Yes Yes Discolored: Yes Yes Deformed: No No Improper Length and Hygiene: Yes Yes EYAL, GREENHAW ([id number]) Electronic Signature(s) Signed: 01/14/2015 3:13:54 PM By: Elpidio Eric BSN, RN Previous  Signature: 01/14/2015 3:13:23 PM Version By: Elpidio Eric BSN, RN Previous Signature: 01/14/2015 2:50:09 PM Version By: Elpidio Eric BSN, RN Entered By: Elpidio Eric on 01/14/2015 15:13:54 [name] ([id number]) -------------------------------------------------------------------------------- Multi Wound Chart Details Patient Name: Chris Riding K. Date of Service: 01/14/2015 2:30 PM Medical Record Number: [id number] Patient Account Number: [id number] Date of Birth/Sex: 1925-04-24 (81 y.o. Male) Treating RN: Clover Mealy, RN, BSN, [place] Sink Primary Care Physician: Vonita Moss Other Clinician: Referring Physician: Vonita Moss Treating Physician/Extender: Rudene Re in Treatment: 0 Vital Signs Height(in): Pulse(bpm): 84 Weight(lbs): Blood Pressure 122/73 (mmHg): Body Mass Index(BMI): Temperature(F): 97.6 Respiratory Rate 17 (breaths/min): Photos: [3:No Photos] [4:No Photos] [N/A:N/A] Wound Location: [3:Right Lower Leg - Posterior] [4:Right Toe Fifth - Dorsal] [N/A:N/A] Wounding Event: [3:Gradually Appeared] [4:Gradually Appeared] [N/A:N/A] Primary Etiology: [3:Venous Leg Ulcer] [4:Diabetic Wound/Ulcer of the Lower Extremity] [N/A:N/A] Comorbid History: [3:N/A]  [4:Hypertension, Type II Diabetes, Osteoarthritis] [N/A:N/A] Date Acquired: [3:12/22/2014] [4:01/12/2015] [N/A:N/A] Weeks of Treatment: [3:0] [4:0] [N/A:N/A] Wound Status: [3:Open] [4:Open] [N/A:N/A] Measurements L x W x D 1x1.5x0.1 [4:0.7x1x0.1] [N/A:N/A] (cm) Area (cm) : [3:1.178] [4:0.55] [N/A:N/A] Volume (cm) : [3:0.118] [4:0.055] [N/A:N/A] % Reduction in Area: [3:0.00%] [4:0.00%] [N/A:N/A] % Reduction in Volume: 0.00% [4:0.00%] [N/A:N/A] Classification: [3:Partial Thickness] [4:Grade 1] [N/A:N/A] Exudate Amount: [3:Small] [4:Small] [N/A:N/A] Exudate Type: [3:Sanguinous] [4:Sanguinous] [N/A:N/A] Exudate Color: [3:red] [4:red] [N/A:N/A] Wound Margin: [3:Distinct, outline attached] [4:Distinct, outline attached] [N/A:N/A] Granulation Amount: [3:Large (67-100%)] [4:Large (67-100%)] [N/A:N/A] Granulation Quality: [3:Red] [4:Red] [N/A:N/A] Necrotic Amount: [3:None Present (0%)] [4:None Present (0%)] [N/A:N/A] Exposed Structures: [3:Fascia: No Fat: No Tendon: No Muscle: No Joint: No] [4:Fascia: No Fat: No Tendon: No Muscle: No Joint: No] [N/A:N/A] Bone: No Bone: No Limited to Skin Limited to Skin Breakdown Breakdown Epithelialization: None None N/A Periwound Skin Texture: Edema: No Edema: No N/A Excoriation: No Excoriation: No Induration: No Induration: No Callus: No Callus: No Crepitus: No Crepitus: No Fluctuance: No Fluctuance: No Friable: No Friable: No Rash: No Rash: No Scarring: No Scarring: No Periwound Skin Moist: Yes Moist: Yes N/A Moisture: Maceration: No Maceration: No Dry/Scaly: No Dry/Scaly: No Periwound Skin Color: Atrophie Blanche: No Atrophie Blanche: No N/A Cyanosis: No Cyanosis: No Ecchymosis: No Ecchymosis: No Erythema: No Erythema: No Hemosiderin Staining: No Hemosiderin Staining: No Mottled: No Mottled: No Pallor: No Pallor: No Rubor: No Rubor: No Temperature: No Abnormality No Abnormality N/A Tenderness on No No  N/A Palpation: Wound Preparation: Ulcer Cleansing: Ulcer Cleansing: N/A Rinsed/Irrigated with Rinsed/Irrigated with Saline Saline Topical Anesthetic Topical Anesthetic Applied: None Applied: None Treatment Notes Electronic Signature(s) Signed: 01/14/2015 3:30:05 PM By: Elpidio Eric BSN, RN Entered By: Elpidio Eric on 01/14/2015 15:30:05 [name] ([id number]) -------------------------------------------------------------------------------- Multi-Disciplinary Care Plan Details Patient Name: Chris Riding K. Date of Service: 01/14/2015 2:30 PM Medical Record Number: [id number] Patient Account Number: [id number] Date of Birth/Sex: Mar 07, 1925 (81 y.o. Male) Treating RN: Clover Mealy, RN, BSN, [place] Sink Primary Care Physician: Vonita Moss Other Clinician: Referring Physician: Vonita Moss Treating Physician/Extender: Rudene Re in Treatment: 0 Active Inactive Abuse / Safety / Falls / Self Care Management Nursing Diagnoses: Impaired home maintenance Impaired physical mobility Knowledge deficit related to: safety; personal, health (wound), emergency Potential for falls Self care deficit: actual or potential Goals: Patient will remain injury free Date Initiated: 01/14/2015 Goal Status: Active Patient/caregiver will verbalize understanding of skin care regimen Date Initiated: 01/14/2015 Goal Status: Active Patient/caregiver will verbalize/demonstrate measure taken to improve self care Date Initiated: 01/14/2015 Goal Status: Active Patient/caregiver will verbalize/demonstrate measures taken to improve the patient's personal safety Date Initiated: 01/14/2015 Goal  Status: Active Patient/caregiver will verbalize/demonstrate measures taken to prevent injury and/or falls Date Initiated: 01/14/2015 Goal Status: Active Interventions: Assess fall risk on admission and as needed Assess: immobility, friction, shearing, incontinence upon admission and as needed Assess impairment of  mobility on admission and as needed per policy Assess self care needs on admission and as needed Provide education on basic hygiene Provide education on fall prevention Provide education on personal and home safety Provide education on safe transfers [name], ALLIGOOD ([id number]) Notes: Orientation to the Wound Care Program Nursing Diagnoses: Knowledge deficit related to the wound healing center program Goals: Patient/caregiver will verbalize understanding of the Wound Healing Center Program Date Initiated: 01/14/2015 Goal Status: Active Interventions: Provide education on orientation to the wound center Notes: Wound/Skin Impairment Nursing Diagnoses: Impaired tissue integrity Knowledge deficit related to smoking impact on wound healing Knowledge deficit related to ulceration/compromised skin integrity Goals: Patient will have a decrease in wound volume by X% from date: (specify in notes) Date Initiated: 01/14/2015 Goal Status: Active Patient/caregiver will verbalize understanding of skin care regimen Date Initiated: 01/14/2015 Goal Status: Active Ulcer/skin breakdown will have a volume reduction of 30% by week 4 Date Initiated: 01/14/2015 Goal Status: Active Ulcer/skin breakdown will have a volume reduction of 50% by week 8 Date Initiated: 01/14/2015 Goal Status: Active Ulcer/skin breakdown will heal within 14 weeks Date Initiated: 01/14/2015 Goal Status: Active Interventions: Assess patient/caregiver ability to obtain necessary supplies Assess patient/caregiver ability to perform ulcer/skin care regimen upon admission and as needed Assess ulceration(s) every visit [name], [name] ([id number]) Provide education on ulcer and skin care Notes: Electronic Signature(s) Signed: 01/14/2015 3:29:25 PM By: Elpidio Eric BSN, RN Entered By: Elpidio Eric on 01/14/2015 15:29:25 [name], [name]  ([id number]) -------------------------------------------------------------------------------- Pain Assessment Details Patient Name: Chris Riding K. Date of Service: 01/14/2015 2:30 PM Medical Record Number: [id number] Patient Account Number: [id number] Date of Birth/Sex: September 25, 1924 (81 y.o. Male) Treating RN: Clover Mealy, RN, BSN, [place] Sink Primary Care Physician: Vonita Moss Other Clinician: Referring Physician: Vonita Moss Treating Physician/Extender: Rudene Re in Treatment: 0 Active Problems Location of Pain Severity and Description of Pain Patient Has Paino Yes Site Locations Rate the pain. Current Pain Level: 3 Character of Pain Describe the Pain: Tender Pain Management and Medication Current Pain Management: Electronic Signature(s) Signed: 01/14/2015 2:45:10 PM By: Elpidio Eric BSN, RN Entered By: Elpidio Eric on 01/14/2015 14:45:10 [name] ([id number]) -------------------------------------------------------------------------------- Patient/Caregiver Education Details Patient Name: Chris Riding K. Date of Service: 01/14/2015 2:30 PM Medical Record Number: [id number] Patient Account Number: [id number] Date of Birth/Gender: 14-Mar-1925 (81 y.o. Male) Treating RN: Clover Mealy, RN, BSN, [place] Sink Primary Care Physician: Vonita Moss Other Clinician: Referring Physician: Vonita Moss Treating Physician/Extender: Rudene Re in Treatment: 0 Education Assessment Education Provided To: Patient and Caregiver Education Topics Provided Basic Hygiene: Methods: Explain/Verbal Responses: State content correctly Safety: Methods: Explain/Verbal Responses: State content correctly Welcome [facility]: Methods: Explain/Verbal Responses: State content correctly Wound/Skin Impairment: Methods: Explain/Verbal Responses: State content correctly Electronic Signature(s) Signed: 01/14/2015 3:35:52 PM By: Elpidio Eric BSN, RN Entered By: Elpidio Eric on  01/14/2015 15:35:52 [name], [name] ([id number]) -------------------------------------------------------------------------------- Wound Assessment Details Patient Name: Chris Riding K. Date of Service: 01/14/2015 2:30 PM Medical Record Number: [id number] Patient Account Number: [id number] Date of Birth/Sex: 01/23/1925 (81 y.o. Male) Treating RN: Clover Mealy, RN, BSN, [place] Sink Primary Care Physician: Vonita Moss Other Clinician: Referring Physician: Vonita Moss Treating Physician/Extender: Rudene Re in Treatment: 0 Wound Status Wound Number: 3 Primary Etiology: Venous Leg Ulcer  Wound Location: Right Lower Leg - Posterior Wound Status: Open Wounding Event: Gradually Appeared Date Acquired: 12/22/2014 Weeks Of Treatment: 0 Clustered Wound: No Photos Photo Uploaded By: Elpidio Eric on 01/14/2015 16:17:07 Wound Measurements Length: (cm) 1 Width: (cm) 1.5 Depth: (cm) 0.1 Area: (cm) 1.178 Volume: (cm) 0.118 % Reduction in Area: 0% % Reduction in Volume: 0% Epithelialization: None Tunneling: No Undermining: No Wound Description Classification: Partial Thickness Wound Margin: Distinct, outline attached Exudate Amount: Small Exudate Type: Sanguinous Exudate Color: red Foul Odor After Cleansing: No Wound Bed Granulation Amount: Large (67-100%) Exposed Structure Granulation Quality: Red Fascia Exposed: No Necrotic Amount: None Present (0%) Fat Layer Exposed: No Tendon Exposed: No [name], [name] ([id number]) Muscle Exposed: No Joint Exposed: No Bone Exposed: No Limited to Skin Breakdown Periwound Skin Texture Texture Color No Abnormalities Noted: No No Abnormalities Noted: No Callus: No Atrophie Blanche: No Crepitus: No Cyanosis: No Excoriation: No Ecchymosis: No Fluctuance: No Erythema: No Friable: No Hemosiderin Staining: No Induration: No Mottled: No Localized Edema: No Pallor: No Rash: No Rubor: No Scarring: No Temperature / Pain Moisture  Temperature: No Abnormality No Abnormalities Noted: No Dry / Scaly: No Maceration: No Moist: Yes Wound Preparation Ulcer Cleansing: Rinsed/Irrigated with Saline Topical Anesthetic Applied: None Electronic Signature(s) Signed: 01/14/2015 2:53:25 PM By: Elpidio Eric BSN, RN Entered By: Elpidio Eric on 01/14/2015 14:53:25 [name], [name] ([id number]) -------------------------------------------------------------------------------- Wound Assessment Details Patient Name: Chris Riding K. Date of Service: 01/14/2015 2:30 PM Medical Record Number: [id number] Patient Account Number: [id number] Date of Birth/Sex: 02-05-1925 (81 y.o. Male) Treating RN: Clover Mealy, RN, BSN, [place] Primary Care Physician: Vonita Moss Other Clinician: Referring Physician: Vonita Moss Treating Physician/Extender: Rudene Re in Treatment: 0 Wound Status Wound Number: 4 Primary Diabetic Wound/Ulcer of the Lower Etiology: Extremity Wound Location: Right Toe Fifth - Dorsal Wound Status: Open Wounding Event: Gradually Appeared Comorbid Hypertension, Type II Diabetes, Date Acquired: 01/12/2015 History: Osteoarthritis Weeks Of Treatment: 0 Clustered Wound: No Photos Photo Uploaded By: Elpidio Eric on 01/14/2015 16:17:08 Wound Measurements Length: (cm) 0.7 Width: (cm) 1 Depth: (cm) 0.1 Area: (cm) 0.55 Volume: (cm) 0.055 % Reduction in Area: 0% % Reduction in Volume: 0% Epithelialization: None Tunneling: No Undermining: No Wound Description Classification: Grade 1 Wound Margin: Distinct, outline attached Exudate Amount: Small Exudate Type: Sanguinous Exudate Color: red Foul Odor After Cleansing: No Wound Bed Granulation Amount: Large (67-100%) Exposed Structure Granulation Quality: Red Fascia Exposed: No Necrotic Amount: None Present (0%) Fat Layer Exposed: No Tendon Exposed: No Quirino, Donn K. ([id number]) Muscle Exposed: No Joint Exposed: No Bone Exposed: No Limited to Skin  Breakdown Periwound Skin Texture Texture Color No Abnormalities Noted: No No Abnormalities Noted: No Callus: No Atrophie Blanche: No Crepitus: No Cyanosis: No Excoriation: No Ecchymosis: No Fluctuance: No Erythema: No Friable: No Hemosiderin Staining: No Induration: No Mottled: No Localized Edema: No Pallor: No Rash: No Rubor: No Scarring: No Temperature / Pain Moisture Temperature: No Abnormality No Abnormalities Noted: No Dry / Scaly: No Maceration: No Moist: Yes Wound Preparation Ulcer Cleansing: Rinsed/Irrigated with Saline Topical Anesthetic Applied: None Treatment Notes Wound #4 (Right, Dorsal Toe Fifth) 1. Cleansed with: Clean wound with Normal Saline 3. Peri-wound Care: Skin Prep 4. Dressing Applied: Prisma Ag 5. Secondary Dressing Applied Bordered Foam Dressing Electronic Signature(s) Signed: 01/14/2015 3:03:27 PM By: Elpidio Eric BSN, RN Entered By: Elpidio Eric on 01/14/2015 15:03:27 [name] ([id number]) -------------------------------------------------------------------------------- Vitals Details Patient Name: Chris Riding K. Date of Service: 01/14/2015 2:30 PM Medical Record Number: [id number] Patient Account  Number: [id number] Date of Birth/Sex: 01/04/1925 (81 y.o. Male) Treating RN: Clover Mealy, RN, BSN, [place] Sink Primary Care Physician: Vonita Moss Other Clinician: Referring Physician: Vonita Moss Treating Physician/Extender: Rudene Re in Treatment: 0 Vital Signs Time Taken: 14:45 Temperature (F): 97.6 Pulse (bpm): 84 Respiratory Rate (breaths/min): 17 Blood Pressure (mmHg): 122/73 Reference Range: 80 - 120 mg / dl Electronic Signature(s) Signed: 01/14/2015 2:45:47 PM By: Elpidio Eric BSN, RN Entered By: Elpidio Eric on 01/14/2015 14:45:46

## 2015-01-15 NOTE — Progress Notes (Signed)
SYLVAN, SOOKDEO ([id number]) Visit Report for 01/14/2015 Chief Complaint Document Details Patient Name: [name], [name]. Date of Service: 01/14/2015 2:30 PM Medical Record Number: [id number] Patient Account Number: [id number] Date of Birth/Sex: 05/27/1925 (81 y.o. Male) Treating RN: Clover Mealy, RN, BSN, [place] Sink Primary Care Physician: Vonita Moss Other Clinician: Referring Physician: Vonita Moss Treating Physician/Extender: Rudene Re in Treatment: 0 Information Obtained from: Patient Chief Complaint Has had swelling of both lower extremities with some ulceration on the right lower extremity. Electronic Signature(s) Signed: 01/14/2015 3:42:02 PM By: Evlyn Kanner MD, FACS Entered By: Evlyn Kanner on 01/14/2015 15:42:01 [name] ([id number]) -------------------------------------------------------------------------------- HPI Details Patient Name: Chris Riding K. Date of Service: 01/14/2015 2:30 PM Medical Record Number: [id number] Patient Account Number: [id number] Date of Birth/Sex: 02-Feb-1925 (81 y.o. Male) Treating RN: Clover Mealy, RN, BSN, [place] Sink Primary Care Physician: Vonita Moss Other Clinician: Referring Physician: Vonita Moss Treating Physician/Extender: Rudene Re in Treatment: 0 History of Present Illness Location: had swelling of the right lower extremity with some ulceration on the medial part of his calf and on the right fifth toe Quality: Patient reports No Pain. Severity: Patient states wound (s) are getting better. Duration: Patient has had the wound for < 2 weeks prior to presenting for treatment Context: The wound appeared gradually over time. nail on his right fifth toe came off loose and he pried it Modifying Factors: Other treatment(s) tried include:vascular workup where he is going to be seeing Dr. Wyn Quaker on Monday Associated Signs and Symptoms: Patient reports presence of swelling HPI Description: Very pleasant 81 year old with a past  medical history significant for type 2 diabetes. He underwent left knee surgery in February 2015. He had an angioplasty of his R posterior tibial on 10/30/13. Vascular workup done in April of this year showed an ABI on the right to be noncompressible and the left to be 0.86. About a week ago he noticed some swelling on the right lower extremity and then an ulceration on the medial part of his calf. He also noted that the right fifth nail bed had a loose nail and he pried it open and now has an open wound. He has no evidence of cellulitis. he was seen by the nurse practitioner at his PCPs office and put on Bactroban ointment and doxycycline for 10 days. Electronic Signature(s) Signed: 01/14/2015 3:49:13 PM By: Evlyn Kanner MD, FACS Entered By: Evlyn Kanner on 01/14/2015 15:49:13 [name] ([id number]) -------------------------------------------------------------------------------- Physical Exam Details Patient Name: Chris Riding K. Date of Service: 01/14/2015 2:30 PM Medical Record Number: [id number] Patient Account Number: [id number] Date of Birth/Sex: 1924-11-27 (81 y.o. Male) Treating RN: Clover Mealy, RN, BSN, [place] Sink Primary Care Physician: Vonita Moss Other Clinician: Referring Physician: Vonita Moss Treating Physician/Extender: Rudene Re in Treatment: 0 Constitutional . Pulse regular. Respirations normal and unlabored. Afebrile. . Eyes Nonicteric. Reactive to light. Ears, Nose, Mouth, and Throat Lips, teeth, and gums WNL.Marland Kitchen Moist mucosa without lesions . Neck supple and nontender. No palpable supraclavicular or cervical adenopathy. Normal sized without goiter. Respiratory WNL. No retractions.. Cardiovascular Pedal Pulses WNL. Doppler signals present monophasic and ABI could not be measured.. he has edema of both lower extremities specially pitting edema of his right lower extremity and foot. Lymphatic No adneopathy. No adenopathy. No  adenopathy. Musculoskeletal Adexa without tenderness or enlargement.. Digits and nails w/o clubbing, cyanosis, infection, petechiae, ischemia, or inflammatory conditions.. Integumentary (Hair, Skin) No suspicious lesions. No crepitus or fluctuance. No peri-wound warmth or erythema. No masses.Marland Kitchen Psychiatric Judgement and insight Intact.Marland Kitchen  No evidence of depression, anxiety, or agitation.. Notes The medial part of his right calf has an ulceration which has a clean granulating base and the right fifth toe nailbed has granulation tissue but no nail. Electronic Signature(s) Signed: 01/14/2015 3:50:23 PM By: Evlyn Kanner MD, FACS Entered By: Evlyn Kanner on 01/14/2015 15:50:22 [name] ([id number]) -------------------------------------------------------------------------------- Physician Orders Details Patient Name: Chris Riding K. Date of Service: 01/14/2015 2:30 PM Medical Record Number: [id number] Patient Account Number: [id number] Date of Birth/Sex: Jan 28, 1925 (81 y.o. Male) Treating RN: Clover Mealy, RN, BSN, [place] Sink Primary Care Physician: Vonita Moss Other Clinician: Referring Physician: Vonita Moss Treating Physician/Extender: Rudene Re in Treatment: 0 Verbal / Phone Orders: Yes Clinician: Afful, RN, BSN, Rita Read Back and Verified: Yes Diagnosis Coding Wound Cleansing Wound #3 Right,Posterior Lower Leg o Clean wound with Normal Saline. o May shower with protection. Wound #4 Right,Dorsal Toe Fifth o Clean wound with Normal Saline. o May shower with protection. Skin Barriers/Peri-Wound Care Wound #3 Right,Posterior Lower Leg o Skin Prep Wound #4 Right,Dorsal Toe Fifth o Skin Prep Primary Wound Dressing Wound #3 Right,Posterior Lower Leg o Prisma Ag Wound #4 Right,Dorsal Toe Fifth o Prisma Ag Secondary Dressing Wound #3 Right,Posterior Lower Leg o Boardered Foam Dressing Wound #4 Right,Dorsal Toe Fifth o Gauze and  Kerlix/Conform Dressing Change Frequency Wound #3 Right,Posterior Lower Leg o Change dressing every other day. Wound #4 Right,Dorsal Toe Fifth o Change dressing every other day. [name], [name] ([id number]) Follow-up Appointments Wound #3 Right,Posterior Lower Leg o Return Appointment in 1 week. Wound #4 Right,Dorsal Toe Fifth o Return Appointment in 1 week. Electronic Signature(s) Signed: 01/14/2015 3:33:48 PM By: Elpidio Eric BSN, RN Signed: 01/14/2015 4:10:06 PM By: Evlyn Kanner MD, FACS Entered By: Elpidio Eric on 01/14/2015 15:33:48 [name] ([id number]) -------------------------------------------------------------------------------- Problem List Details Patient Name: Chris Riding K. Date of Service: 01/14/2015 2:30 PM Medical Record Number: [id number] Patient Account Number: [id number] Date of Birth/Sex: Apr 22, 1925 (81 y.o. 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 Date of Service: 01/14/2015 2:30  PM Medical Record Number: [id number] Patient Account Number: [id number] Date of Birth/Sex: 06/05/1924 (81 y.o. Male) Treating RN: Clover Mealy, RN, BSN, [place] Sink Primary Care Physician: Vonita Moss Other Clinician: Referring Physician: Vonita Moss Treating Physician/Extender: Rudene Re in Treatment: 0 Subjective Chief Complaint Information obtained from Patient Has had swelling of both lower extremities with some ulceration on the right lower extremity. History of Present Illness (HPI) The following HPI elements were documented for the patient's wound: Location: had swelling of the right lower extremity with some ulceration on the medial part of his calf and on the right fifth toe Quality: Patient reports No Pain. Severity: Patient states wound (s) are getting better. Duration: Patient has had the wound for < 2 weeks prior to presenting  for treatment Context: The wound appeared gradually over time. nail on his right fifth toe came off loose and he pried it Modifying Factors: Other treatment(s) tried include:vascular workup where he is going to be seeing Dr. Wyn Quaker on Monday Associated Signs and Symptoms: Patient reports presence of swelling Very pleasant 81 year old with a past medical history significant for type 2 diabetes. He underwent left knee surgery in February 2015. He had an angioplasty of his R posterior tibial on 10/30/13. Vascular workup done in April of this year showed an ABI on the right to be noncompressible and the left to be 0.86. About a week ago he noticed some swelling on the right lower extremity and then an ulceration on the medial part of his calf. He also noted that the right fifth nail bed had a loose nail and he pried it open and now has an open wound. He has no evidence of cellulitis. he was seen by the nurse practitioner at his PCPs office and put on Bactroban ointment and doxycycline for 10 days. Wound History Patient presents with 1 open wound that has  been present for approximately 2weeks. Patient has been treating wound in the following manner: dry dressing. Laboratory tests have not been performed in the last month. Patient reportedly has not tested positive for an antibiotic resistant organism. Patient reportedly has had testing performed to evaluate circulation in the legs. Patient experiences the following problems associated with their wounds: swelling. Patient History Information obtained from Patient. Allergies Martinec, Eligah K. ([id number]) No Known Drug Allergies Family History Diabetes - Siblings, Heart Disease - Father, Stroke - Mother, No family history of Cancer, Hereditary Spherocytosis, Hypertension, Kidney Disease, Lung Disease, Seizures, Thyroid Problems, Tuberculosis. Social History Never smoker, Marital Status - Widowed, Alcohol Use - Never, Drug Use - No History, Caffeine Use - Daily. Medical History Ear/Nose/Mouth/Throat Denies history of Chronic sinus problems/congestion, Middle ear problems Hematologic/Lymphatic Denies history of Anemia, Hemophilia, Human Immunodeficiency Virus, Lymphedema, Sickle Cell Disease Respiratory Denies history of Aspiration, Asthma, Chronic Obstructive Pulmonary Disease (COPD), Pneumothorax, Sleep Apnea, Tuberculosis Cardiovascular Patient has history of Hypertension Denies history of Angina, Arrhythmia, Congestive Heart Failure, Coronary Artery Disease, Hypotension, Myocardial Infarction, Peripheral Arterial Disease, Peripheral Venous Disease, Phlebitis, Vasculitis Gastrointestinal Denies history of Cirrhosis , Colitis, Crohn s, Hepatitis A, Hepatitis B, Hepatitis C Endocrine Patient has history of Type II Diabetes Immunological Denies history of Lupus Erythematosus, Raynaud s, Scleroderma Oncologic Denies history of Received Chemotherapy, Received Radiation Psychiatric Denies history of Anorexia/bulimia, Confinement Anxiety Patient is treated with Oral Agents. Medical And  Surgical History Notes Cardiovascular stent Endocrine hypothyrodism Genitourinary enlarged prostate Neurologic parkionson Review of Systems (ROS) Constitutional Symptoms (General Health) The patient has no complaints or symptoms. Eyes Complains or has symptoms of Glasses / Contacts. [name], [name] ([id number]) Ear/Nose/Mouth/Throat The patient has no complaints or symptoms. Hematologic/Lymphatic The patient has no complaints or symptoms. Respiratory The patient has no complaints or symptoms. Cardiovascular The patient has no complaints or symptoms. Gastrointestinal The patient has no complaints or symptoms. Genitourinary The patient has no complaints or symptoms. Immunological The patient has no complaints or symptoms. Integumentary (Skin) Complains or has symptoms of Wounds, Breakdown, Swelling. Musculoskeletal The patient has no complaints or symptoms. Oncologic The patient has no complaints or symptoms. Psychiatric The patient has no complaints or symptoms. Medications aspirin 81 mg tablet,delayed release oral 1 1 tablet,delayed release (DR/EC) oral amiodarone 200 mg tablet oral tablet oral Januvia 100 mg tablet oral 1 1 tablet oral carbidopa 25 mg-levodopa 100  mg tablet oral 1 1 tablet oral carbidopa ER 25 mg-levodopa 100 mg tablet,extended release oral 1 1 tablet extended release oral pramipexole 1 mg tablet oral tablet oral tamsulosin 0.4 mg capsule oral 1 1 capsule,extended release 24hr oral docusate sodium 100 mg capsule oral 1 1 capsule oral furosemide 20 mg tablet oral 1 1 tablet oral clopidogrel 75 mg tablet oral 1 1 tablet oral potassium chloride ER 10 mEq tablet,extended release oral 1 1 tablet extended release oral omeprazole 20 mg tablet,delayed release oral 1 1 tablet,delayed release (DR/EC) oral doxycycline hyclate 100 mg tablet oral 1 1 tablet oral levothyroxine 150 mcg tablet oral 1 1 tablet oral mupirocin 2 % topical ointment topical ointment  topical cyanocobalamin (vit B-12) 1,000 mcg/mL injection solution injection 1 1 solution injection Objective [name], [name] ([id number]) Constitutional Pulse regular. Respirations normal and unlabored. Afebrile. Vitals Time Taken: 2:45 PM, Temperature: 97.6 F, Pulse: 84 bpm, Respiratory Rate: 17 breaths/min, Blood Pressure: 122/73 mmHg. Eyes Nonicteric. Reactive to light. Ears, Nose, Mouth, and Throat Lips, teeth, and gums WNL.Marland Kitchen Moist mucosa without lesions . Neck supple and nontender. No palpable supraclavicular or cervical adenopathy. Normal sized without goiter. Respiratory WNL. No retractions.. Cardiovascular Pedal Pulses WNL. Doppler signals present monophasic and ABI could not be measured.. he has edema of both lower extremities specially pitting edema of his right lower extremity and foot. Lymphatic No adneopathy. No adenopathy. No adenopathy. Musculoskeletal Adexa without tenderness or enlargement.. Digits and nails w/o clubbing, cyanosis, infection, petechiae, ischemia, or inflammatory conditions.Marland Kitchen Psychiatric Judgement and insight Intact.. No evidence of depression, anxiety, or agitation.. General Notes: The medial part of his right calf has an ulceration which has a clean granulating base and the right fifth toe nailbed has granulation tissue but no nail. Integumentary (Hair, Skin) No suspicious lesions. No crepitus or fluctuance. No peri-wound warmth or erythema. No masses.. Wound #3 status is Open. Original cause of wound was Gradually Appeared. The wound is located on the Right,Posterior Lower Leg. The wound measures 1cm length x 1.5cm width x 0.1cm depth; 1.178cm^2 area and 0.118cm^3 volume. The wound is limited to skin breakdown. There is no tunneling or undermining noted. There is a small amount of sanguinous drainage noted. The wound margin is distinct with the outline attached to the wound base. There is large (67-100%) red granulation within the wound bed.  There is no necrotic tissue within the wound bed. The periwound skin appearance exhibited: Moist. The periwound skin appearance did not exhibit: Callus, Crepitus, Excoriation, Fluctuance, Friable, Induration, Localized Edema, Rash, Scarring, Dry/Scaly, Maceration, Atrophie Blanche, Cyanosis, Ecchymosis, Hemosiderin Staining, Mottled, Pallor, Rubor, Erythema. Periwound temperature was noted as No Abnormality. [name], [name] ([id number]) Wound #4 status is Open. Original cause of wound was Gradually Appeared. The wound is located on the Right,Dorsal Toe Fifth. The wound measures 0.7cm length x 1cm width x 0.1cm depth; 0.55cm^2 area and 0.055cm^3 volume. The wound is limited to skin breakdown. There is no tunneling or undermining noted. There is a small amount of sanguinous drainage noted. The wound margin is distinct with the outline attached to the wound base. There is large (67-100%) red granulation within the wound bed. There is no necrotic tissue within the wound bed. The periwound skin appearance exhibited: Moist. The periwound skin appearance did not exhibit: Callus, Crepitus, Excoriation, Fluctuance, Friable, Induration, Localized Edema, Rash, Scarring, Dry/Scaly, Maceration, Atrophie Blanche, Cyanosis, Ecchymosis, Hemosiderin Staining, Mottled, Pallor, Rubor, Erythema. Periwound temperature was noted as No Abnormality. Assessment Active Problems ICD-10 E11.622 - Type 2  diabetes mellitus with other skin ulcer E11.51 - Type 2 diabetes mellitus with diabetic peripheral angiopathy without gangrene L97.212 - Non-pressure chronic ulcer of right calf with fat layer exposed L97.512 - Non-pressure chronic ulcer of other part of right foot with fat layer exposed I89.0 - Lymphedema, not elsewhere classified This 81 year old gentleman has had arterial insufficiency and is being worked up actively by his Physiological scientist. I have recommended we continue to use Prisma AG on the wounds and have  recommended elevation as much as possible. No compression as been recommended at this stage due to his arterial insufficiency. Also told him to keep up with his diarrhea [name] other medications and specially his blood sugar. He will come back and see as an regular basis. Plan Wound Cleansing: Wound #3 Right,Posterior Lower Leg: Clean wound with Normal Saline. May shower with protection. Wound #4 Right,Dorsal Toe Fifth: Clean wound with Normal Saline. May shower with protection. Skin Barriers/Peri-Wound Care: Wound #3 Right,Posterior Lower Leg: Skin Prep Wound #4 Right,Dorsal Toe Fifth: Skin Prep [name], [name] ([id number]) Primary Wound Dressing: Wound #3 Right,Posterior Lower Leg: Prisma Ag Wound #4 Right,Dorsal Toe Fifth: Prisma Ag Secondary Dressing: Wound #3 Right,Posterior Lower Leg: Boardered Foam Dressing Wound #4 Right,Dorsal Toe Fifth: Gauze and Kerlix/Conform Dressing Change Frequency: Wound #3 Right,Posterior Lower Leg: Change dressing every other day. Wound #4 Right,Dorsal Toe Fifth: Change dressing every other day. Follow-up Appointments: Wound #3 Right,Posterior Lower Leg: Return Appointment in 1 week. Wound #4 Right,Dorsal Toe Fifth: Return Appointment in 1 week. This 81 year old gentleman has had arterial insufficiency and is being worked up actively by his Physiological scientist. I have recommended we continue to use Prisma AG on the wounds and have recommended elevation as much as possible. No compression as been recommended at this stage due to his arterial insufficiency. Also told him to keep up with his diarrhea [name] other medications and specially his blood sugar. He will come back and see as an regular basis. Electronic Signature(s) Signed: 01/14/2015 3:51:59 PM By: Evlyn Kanner MD, FACS Entered By: Evlyn Kanner on 01/14/2015 15:51:59 [name]  ([id number]) -------------------------------------------------------------------------------- ROS/PFSH Details Patient Name: Chris Riding K. Date of Service: 01/14/2015 2:30 PM Medical Record Number: [id number] Patient Account Number: [id number] Date of Birth/Sex: November 25, 1924 (81 y.o. Male) Treating RN: Clover Mealy, RN, BSN, Rita Primary Care Physician: Vonita Moss Other Clinician: Referring Physician: Vonita Moss Treating Physician/Extender: Rudene Re in Treatment: 0 Information Obtained From Patient Wound History Do you currently have one or more open woundso Yes How many open wounds do you currently haveo 1 Approximately how long have you had your woundso 2weeks How have you been treating your wound(s) until nowo dry dressing Has your wound(s) ever healed and then re-openedo No Have you had any lab work done in the past montho No Have you tested positive for an antibiotic resistant organism (MRSA, VRE)o No Have you had any tests for circulation on your legso Yes Have you had other problems associated with your woundso Swelling Eyes Complaints and Symptoms: Positive for: Glasses / Contacts Integumentary (Skin) Complaints and Symptoms: Positive for: Wounds; Breakdown; Swelling Constitutional Symptoms (General Health) Complaints and Symptoms: No Complaints or Symptoms Ear/Nose/Mouth/Throat Complaints and Symptoms: No Complaints or Symptoms Medical History: Negative for: Chronic sinus problems/congestion; Middle ear problems Hematologic/Lymphatic Complaints and Symptoms: No Complaints or Symptoms Medical History: [name], [name] ([id number]) Negative for: Anemia; Hemophilia; Human Immunodeficiency Virus; Lymphedema; Sickle Cell Disease Respiratory Complaints and Symptoms: No Complaints or Symptoms Medical History: Negative for: Aspiration;  Asthma; Chronic Obstructive Pulmonary Disease (COPD); Pneumothorax; Sleep Apnea; Tuberculosis Cardiovascular Complaints  and Symptoms: No Complaints or Symptoms Medical History: Positive for: Hypertension Negative for: Angina; Arrhythmia; Congestive Heart Failure; Coronary Artery Disease; Hypotension; Myocardial Infarction; Peripheral Arterial Disease; Peripheral Venous Disease; Phlebitis; Vasculitis Past Medical History Notes: stent Gastrointestinal Complaints and Symptoms: No Complaints or Symptoms Medical History: Negative for: Cirrhosis ; Colitis; Crohnos; Hepatitis A; Hepatitis B; Hepatitis C Endocrine Medical History: Positive for: Type II Diabetes Past Medical History Notes: hypothyrodism Time with diabetes: 10years Treated with: Oral agents Genitourinary Complaints and Symptoms: No Complaints or Symptoms Medical History: Past Medical History Notes: enlarged prostate Immunological [name], [name] ([id number]) Complaints and Symptoms: No Complaints or Symptoms Medical History: Negative for: Lupus Erythematosus; Raynaudos; Scleroderma Musculoskeletal Complaints and Symptoms: No Complaints or Symptoms Medical History: Positive for: Osteoarthritis Neurologic Medical History: Past Medical History Notes: parkionson Oncologic Complaints and Symptoms: No Complaints or Symptoms Medical History: Negative for: Received Chemotherapy; Received Radiation Psychiatric Complaints and Symptoms: No Complaints or Symptoms Medical History: Negative for: Anorexia/bulimia; Confinement Anxiety Family and Social History Cancer: No; Diabetes: Yes - Siblings; Heart Disease: Yes - Father; Hereditary Spherocytosis: No; Hypertension: No; Kidney Disease: No; Lung Disease: No; Seizures: No; Stroke: Yes - Mother; Thyroid Problems: No; Tuberculosis: No; Never smoker; Marital Status - Widowed; Alcohol Use: Never; Drug Use: No History; Caffeine Use: Daily; Financial Concerns: No; Food, Clothing or Shelter Needs: No; Support System Lacking: No; Transportation Concerns: No; Advanced Directives: No; Patient  does not want information on Advanced Directives; Living Will: No Physician Affirmation I have reviewed and agree with the above information. Electronic Signature(s) Signed: 01/14/2015 3:05:59 PM By: Evlyn Kanner MD, FACS Signed: 01/14/2015 4:02:32 PM By: Elpidio Eric BSN, RN Previous Signature: 01/14/2015 2:58:46 PM Version By: Elpidio Eric BSN, RN [name] ([id number]) Entered By: Evlyn Kanner on 01/14/2015 15:05:58 [name] ([id number]) -------------------------------------------------------------------------------- SuperBill Details Patient Name: Chris Riding K. Date of Service: 01/14/2015 Medical Record Number: [id number] Patient Account Number: [id number] Date of Birth/Sex: 1925-05-02 (81 y.o. 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 I89.0 Lymphedema, not elsewhere classified Facility Procedures CPT4 Code: 24401027 Description: 99214 - WOUND CARE VISIT-LEV 4 EST PT Modifier: Quantity: 1 Physician Procedures CPT4: Description Modifier Quantity Code 2536644 99214 - WC PHYS LEVEL 4 - EST PT 1 ICD-10 Description Diagnosis E11.622 Type 2 diabetes mellitus with other skin ulcer L97.212 Non-pressure chronic ulcer of right calf with fat layer exposed E11.51 Type 2  diabetes mellitus with diabetic peripheral angiopathy without gangrene L97.512 Non-pressure chronic ulcer of other part of right foot with fat layer exposed Electronic Signature(s) Signed: 01/14/2015 3:52:15 PM By:  Evlyn Kanner MD, FACS Entered By: Evlyn Kanner on 01/14/2015 15:52:15

## 2015-01-15 NOTE — Progress Notes (Signed)
CHUKWUDI, EWEN ([id number]) Visit Report for 01/14/2015 Abuse/Suicide Risk Screen Details Patient Name: [name], [name]. Date of Service: 01/14/2015 2:30 PM Medical Record Number: [id number] Patient Account Number: [id number] Date of Birth/Sex: 02/13/1925 (81 y.o. Male) Treating RN: Clover Mealy, RN, BSN, [place] Sink Primary Care Physician: Vonita Moss Other Clinician: Referring Physician: Vonita Moss Treating Physician/Extender: Rudene Re in Treatment: 0 Abuse/Suicide Risk Screen Items Answer ABUSE/SUICIDE RISK SCREEN: Has anyone close to you tried to hurt or harm you recentlyo No Do you feel uncomfortable with anyone in your familyo No Has anyone forced you do things that you didnot want to doo No Do you have any thoughts of harming yourselfo No Patient displays signs or symptoms of abuse and/or neglect. No Electronic Signature(s) Signed: 01/14/2015 2:58:54 PM By: Elpidio Eric BSN, RN Entered By: Elpidio Eric on 01/14/2015 14:58:54 [name] ([id number]) -------------------------------------------------------------------------------- Activities of Daily Living Details Patient Name: [name] Date of Service: 01/14/2015 2:30 PM Medical Record Number: [id number] Patient Account Number: [id number] Date of Birth/Sex: [birth year] (81 y.o. Male) Treating RN: Clover Mealy, RN, BSN, [place] Sink Primary Care Physician: Vonita Moss Other Clinician: Referring Physician: Vonita Moss Treating Physician/Extender: Rudene Re in Treatment: 0 Activities of Daily Living Items Answer Activities of Daily Living (Please select one for each item) Drive Automobile Not Able Take Medications Need Assistance Use Telephone Need Assistance Care for Appearance Need Assistance Use Toilet Need Assistance Bath / Shower Need Assistance Dress Self Need Assistance Feed Self Need Assistance Walk Need Assistance Get In / Out Bed Need Assistance Housework Need Assistance Prepare Meals Need  Assistance Handle Money Need Assistance Shop for Self Need Assistance Electronic Signature(s) Signed: 01/14/2015 2:59:20 PM By: Elpidio Eric BSN, RN Entered By: Elpidio Eric on 01/14/2015 14:59:20 [name] ([id number]) -------------------------------------------------------------------------------- Education Assessment Details Patient Name: [name] Date of Service: 01/14/2015 2:30 PM Medical Record Number: [id number] Patient Account Number: [id number] Date of Birth/Sex: 08-23-1924 (81 y.o. Male) Treating RN: Clover Mealy, RN, BSN, [place] Sink Primary Care Physician: Vonita Moss Other Clinician: Referring Physician: Vonita Moss Treating Physician/Extender: Rudene Re in Treatment: 0 Primary Learner Assessed: Patient Learning Preferences/Education Level/Primary Language Learning Preference: Explanation Highest Education Level: College or Above Preferred Language: English Cognitive Barrier Assessment/Beliefs Language Barrier: No Physical Barrier Assessment Impaired Vision: Yes Glasses Impaired Hearing: No Decreased Hand dexterity: No Knowledge/Comprehension Assessment Knowledge Level: High Comprehension Level: High Ability to understand written High instructions: Ability to understand verbal High instructions: Motivation Assessment Anxiety Level: Calm Cooperation: Cooperative Education Importance: Acknowledges Need Interest in Health Problems: Asks Questions Perception: Coherent Willingness to Engage in Self- High Management Activities: Readiness to Engage in Self- High Management Activities: Electronic Signature(s) Signed: 01/14/2015 2:59:51 PM By: Elpidio Eric BSN, RN Entered By: Elpidio Eric on 01/14/2015 14:59:51 [name] ([id number]) -------------------------------------------------------------------------------- Fall Risk Assessment Details Patient Name: [name]. Date of Service: 01/14/2015 2:30 PM Medical Record Number:  [id number] Patient Account Number: [id number] Date of Birth/Sex: 1924-12-29 (81 y.o. Male) Treating RN: Clover Mealy, RN, BSN,  Sink Primary Care Physician: Vonita Moss Other Clinician: Referring Physician: Vonita Moss Treating Physician/Extender: Rudene Re in Treatment: 0 Fall Risk Assessment Items FALL RISK ASSESSMENT: History of falling - immediate or within 3 months 0 No Secondary diagnosis 0 No Ambulatory aid None/bed rest/wheelchair/nurse 0 Yes Crutches/cane/walker 0 No Furniture 0 No IV Access/Saline Lock 0 No Gait/Training Normal/bed rest/immobile 0 No Weak 10 Yes Impaired 20 Yes Mental Status Oriented to own ability 0 Yes Electronic Signature(s) Signed: 01/14/2015 3:00:17 PM By: Elpidio Eric  BSN, RN Entered By: Elpidio Eric on 01/14/2015 15:00:16 [name] ([id number]) -------------------------------------------------------------------------------- Foot Assessment Details Patient Name: GRASON, BRAILSFORD. Date of Service: 01/14/2015 2:30 PM Medical Record Number: [id number] Patient Account Number: [id number] Date of Birth/Sex: Nov 26, 1924 (81 y.o. Male) Treating RN: Clover Mealy, RN, BSN, [place] Sink Primary Care Physician: Vonita Moss Other Clinician: Referring Physician: Vonita Moss Treating Physician/Extender: Rudene Re in Treatment: 0 Foot Assessment Items Site Locations + = Sensation present, - = Sensation absent, C = Callus, U = Ulcer R = Redness, W = Warmth, M = Maceration, PU = Pre-ulcerative lesion F = Fissure, S = Swelling, D = Dryness Assessment Right: Left: Other Deformity: No No Prior Foot Ulcer: No No Prior Amputation: No No Charcot Joint: No No Ambulatory Status: Non-ambulatory Assistance Device: Wheelchair Gait: Surveyor, mining) Signed: 01/14/2015 3:00:45 PM By: Elpidio Eric BSN, RN Entered By: Elpidio Eric on 01/14/2015 15:00:45 [name]  ([id number]) -------------------------------------------------------------------------------- Nutrition Risk Assessment Details Patient Name: [name] Date of Service: 01/14/2015 2:30 PM Medical Record Number: [id number] Patient Account Number: [id number] Date of Birth/Sex: Nov 20, 1924 (81 y.o. Male) Treating RN: Clover Mealy, RN, BSN, [place] Sink Primary Care Physician: Vonita Moss Other Clinician: Referring Physician: Vonita Moss Treating Physician/Extender: Rudene Re in Treatment: 0 Height (in): Weight (lbs): Body Mass Index (BMI): Nutrition Risk Assessment Items NUTRITION RISK SCREEN: I have an illness or condition that made me change the kind and/or 0 No amount of food I eat I eat fewer than two meals per day 3 Yes I eat few fruits and vegetables, or milk products 0 No I have three or more drinks of beer, liquor or wine almost every day 0 No I have tooth or mouth problems that make it hard for me to eat 0 No I don't always have enough money to buy the food I need 0 No I eat alone most of the time 0 No I take three or more different prescribed or over-the-counter drugs a 0 No day Without wanting to, I have lost or gained 10 pounds in the last six 0 No months I am not always physically able to shop, cook and/or feed myself 0 No Nutrition Protocols Good Risk Protocol Provide education on Moderate Risk Protocol 0 nutrition Electronic Signature(s) Signed: 01/14/2015 3:00:32 PM By: Elpidio Eric BSN, RN Entered By: Elpidio Eric on 01/14/2015 15:00:32

## 2015-01-18 ENCOUNTER — Telehealth: Payer: Self-pay

## 2015-01-18 NOTE — Telephone Encounter (Signed)
Please call pt ASAP! Thanks. °

## 2015-01-20 ENCOUNTER — Telehealth: Payer: Self-pay | Admitting: Family Medicine

## 2015-01-20 NOTE — Telephone Encounter (Signed)
Pt also wants to know if he can change from [place] to metformin? More cost effective. Thanks.

## 2015-01-20 NOTE — Telephone Encounter (Signed)
Pt wanted to know if we have any samples of [place]. Pt states medication is $5 per pill now. Please call only if we have samples. Thanks.

## 2015-01-21 ENCOUNTER — Encounter: Payer: PPO | Admitting: Surgery

## 2015-01-21 DIAGNOSIS — L97212 Non-pressure chronic ulcer of right calf with fat layer exposed: Secondary | ICD-10-CM | POA: Diagnosis not present

## 2015-01-22 NOTE — Progress Notes (Addendum)
JACKOB, CROOKSTON ([id number]) Visit Report for 01/21/2015 Arrival Information Details Patient Name: [name], [name]. Date of Service: 01/21/2015 1:00 PM Medical Record Number: [id number] Patient Account Number: [id number] Date of Birth/Sex: Mar 03, 1925 (81 y.o. Male) Treating RN: Huel Coventry Primary Care Physician: Vonita Moss Other Clinician: Referring Physician: Vonita Moss Treating Physician/Extender: Rudene Re in Treatment: 1 Visit Information History Since Last Visit Added or deleted any medications: No Patient Arrived: Wheel Chair Any new allergies or adverse reactions: No Arrival Time: 13:09 Had a fall or experienced change in No activities of daily living that may affect Accompanied By: son risk of falls: Transfer Assistance: Manual Signs or symptoms of abuse/neglect since last No Patient Identification Verified: Yes visito Secondary Verification Process Yes Hospitalized since last visit: No Completed: Has Dressing in Place as Prescribed: Yes Patient Requires Transmission-Based No Has Compression in Place as Prescribed: Yes Precautions: Pain Present Now: No Patient Has Alerts: No Electronic Signature(s) Signed: 01/21/2015 5:07:03 PM By: Elliot Gurney, RN, BSN, Kim RN, BSN Entered By: Elliot Gurney, RN, BSN, Kim on 01/21/2015 13:11:28 [name] ([id number]) -------------------------------------------------------------------------------- Encounter Discharge Information Details Patient Name: Chris Riding K. Date of Service: 01/21/2015 1:00 PM Medical Record Number: [id number] Patient Account Number: [id number] Date of Birth/Sex: 1925-01-03 (81 y.o. Male) Treating RN: Huel Coventry Primary Care Physician: Vonita Moss Other Clinician: Referring Physician: Vonita Moss Treating Physician/Extender: Rudene Re in Treatment: 1 Encounter Discharge Information Items Discharge Pain Level: 0 Discharge Condition: Stable Ambulatory Status:  Wheelchair Discharge Destination: Home Transportation: Private Auto Accompanied By: son Schedule Follow-up Appointment: Yes Medication Reconciliation completed and provided to Patient/Care Yes Deondrea Markos: Provided on Clinical Summary of Care: 01/21/2015 Form Type Recipient Paper Patient [facility] Electronic Signature(s) Signed: 01/21/2015 1:51:14 PM By: Gwenlyn Perking Entered By: Gwenlyn Perking on 01/21/2015 13:51:14 Lembcke, [name] ([id number]) -------------------------------------------------------------------------------- Lower Extremity Assessment Details Patient Name: Chris Riding K. Date of Service: 01/21/2015 1:00 PM Medical Record Number: [id number] Patient Account Number: [id number] Date of Birth/Sex: 07-Dec-1924 (81 y.o. Male) Treating RN: Huel Coventry Primary Care Physician: Vonita Moss Other Clinician: Referring Physician: Vonita Moss Treating Physician/Extender: Rudene Re in Treatment: 1 Edema Assessment Assessed: [Left: No] [Right: No] E[Left: dema] [Right: :] Calf Left: Right: Point of Measurement: cm From Medial Instep cm 33.5 cm Ankle Left: Right: Point of Measurement: cm From Medial Instep cm 25.8 cm Vascular Assessment Pulses: Posterior Tibial Palpable: [Right:No] Doppler: [Right:Monophasic] Dorsalis Pedis Palpable: [Right:No] Doppler: [Right:Monophasic] Extremity colors, hair growth, and conditions: Extremity Color: [Right:Hyperpigmented] Hair Growth on Extremity: [Right:No] Temperature of Extremity: [Right:Warm] Capillary Refill: [Right:> 3 seconds] Toe Nail Assessment Left: Right: Thick: No Discolored: Yes Deformed: No Improper Length and Hygiene: No Electronic Signature(s) Signed: 01/21/2015 5:07:03 PM By: Elliot Gurney, RN, BSN, Kim RN, BSN [place], Johncarlo KMarland Kitchen ([id number]) Entered By: Elliot Gurney, RN, BSN, Kim on 01/21/2015 13:23:14 Missey, [name] ([id number]) -------------------------------------------------------------------------------- Multi  Wound Chart Details Patient Name: Chris Riding K. Date of Service: 01/21/2015 1:00 PM Medical Record Number: [id number] Patient Account Number: [id number] Date of Birth/Sex: February 01, 1925 (81 y.o. Male) Treating RN: Huel Coventry Primary Care Physician: Vonita Moss Other Clinician: Referring Physician: Vonita Moss Treating Physician/Extender: Rudene Re in Treatment: 1 Vital Signs Height(in): Pulse(bpm): 88 Weight(lbs): Blood Pressure 136/78 (mmHg): Body Mass Index(BMI): Temperature(F): 97.5 Respiratory Rate 18 (breaths/min): Photos: [3:No Photos] [4:No Photos] [N/A:N/A] Wound Location: [3:Right Lower Leg - Posterior] [4:Right Toe Fifth - Dorsal] [N/A:N/A] Wounding Event: [3:Gradually Appeared] [4:Gradually Appeared] [N/A:N/A] Primary Etiology: [3:Diabetic Wound/Ulcer of the Lower Extremity] [4:Diabetic Wound/Ulcer of the Lower Extremity] [N/A:N/A]  Comorbid History: [3:Hypertension, Type II Diabetes, Osteoarthritis] [4:Hypertension, Type II Diabetes, Osteoarthritis] [N/A:N/A] Date Acquired: [3:12/22/2014] [4:01/12/2015] [N/A:N/A] Weeks of Treatment: [3:1] [4:1] [N/A:N/A] Wound Status: [3:Open] [4:Healed - Epithelialized] [N/A:N/A] Measurements L x W x D 0.6x0.5x0.1 [4:0x0x0] [N/A:N/A] (cm) Area (cm) : [3:0.236] [4:0] [N/A:N/A] Volume (cm) : [3:0.024] [4:0] [N/A:N/A] % Reduction in Area: [3:80.00%] [4:100.00%] [N/A:N/A] % Reduction in Volume: 79.70% [4:100.00%] [N/A:N/A] Classification: [3:Grade 1] [4:Grade 1] [N/A:N/A] Exudate Amount: [3:Small] [4:Small] [N/A:N/A] Exudate Type: [3:Sanguinous] [4:Sanguinous] [N/A:N/A] Exudate Color: [3:red] [4:red] [N/A:N/A] Wound Margin: [3:Distinct, outline attached] [4:Distinct, outline attached] [N/A:N/A] Granulation Amount: [3:Large (67-100%)] [4:Large (67-100%)] [N/A:N/A] Granulation Quality: [3:Red] [4:Red] [N/A:N/A] Necrotic Amount: [3:None Present (0%)] [4:None Present (0%)] [N/A:N/A] Exposed Structures: [3:Fascia: No  Fat: No Tendon: No Muscle: No Joint: No] [4:Fascia: No Fat: No Tendon: No Muscle: No Joint: No] [N/A:N/A] Bone: No Bone: No Limited to Skin Limited to Skin Breakdown Breakdown Epithelialization: Small (1-33%) None N/A Periwound Skin Texture: Edema: No Edema: No N/A Excoriation: No Excoriation: No Induration: No Induration: No Callus: No Callus: No Crepitus: No Crepitus: No Fluctuance: No Fluctuance: No Friable: No Friable: No Rash: No Rash: No Scarring: No Scarring: No Periwound Skin Moist: Yes Moist: Yes N/A Moisture: Maceration: No Maceration: No Dry/Scaly: No Dry/Scaly: No Periwound Skin Color: Atrophie Blanche: No Atrophie Blanche: No N/A Cyanosis: No Cyanosis: No Ecchymosis: No Ecchymosis: No Erythema: No Erythema: No Hemosiderin Staining: No Hemosiderin Staining: No Mottled: No Mottled: No Pallor: No Pallor: No Rubor: No Rubor: No Temperature: No Abnormality No Abnormality N/A Tenderness on No No N/A Palpation: Wound Preparation: Ulcer Cleansing: Ulcer Cleansing: N/A Rinsed/Irrigated with Rinsed/Irrigated with Saline Saline Topical Anesthetic Topical Anesthetic Applied: None Applied: None Assessment Notes: N/A toenail fell off. N/A Treatment Notes Electronic Signature(s) Signed: 01/21/2015 5:07:03 PM By: Elliot Gurney, RN, BSN, Kim RN, BSN Entered By: Elliot Gurney, RN, BSN, Kim on 01/21/2015 13:26:17 [name] ([id number]) -------------------------------------------------------------------------------- Multi-Disciplinary Care Plan Details Patient Name: Chris Riding K. Date of Service: 01/21/2015 1:00 PM Medical Record Number: [id number] Patient Account Number: [id number] Date of Birth/Sex: 1925-01-10 (81 y.o. Male) Treating RN: Huel Coventry Primary Care Physician: Vonita Moss Other Clinician: Referring Physician: Vonita Moss Treating Physician/Extender: Rudene Re in Treatment: 1 Active Inactive Electronic Signature(s) Signed:  02/01/2015 1:25:02 PM By: Elliot Gurney RN, BSN, Kim RN, BSN Previous Signature: 01/21/2015 5:07:03 PM Version By: Elliot Gurney RN, BSN, Kim RN, BSN Entered By: Elliot Gurney, RN, BSN, Kim on 01/28/2015 15:20:30 [name] ([id number]) -------------------------------------------------------------------------------- Pain Assessment Details Patient Name: Chris Riding K. Date of Service: 01/21/2015 1:00 PM Medical Record Number: [id number] Patient Account Number: [id number] Date of Birth/Sex: Aug 09, 1924 (81 y.o. Male) Treating RN: Huel Coventry Primary Care Physician: Vonita Moss Other Clinician: Referring Physician: Vonita Moss Treating Physician/Extender: Rudene Re in Treatment: 1 Active Problems Location of Pain Severity and Description of Pain Patient Has Paino No Site Locations Pain Management and Medication Current Pain Management: Electronic Signature(s) Signed: 01/21/2015 5:07:03 PM By: Elliot Gurney, RN, BSN, Kim RN, BSN Entered By: Elliot Gurney, RN, BSN, Kim on 01/21/2015 13:11:47 [name] ([id number]) -------------------------------------------------------------------------------- Patient/Caregiver Education Details Patient Name: [name]. Date of Service: 01/21/2015 1:00 PM Medical Record Number: [id number] Patient Account Number: [id number] Date of Birth/Gender: [birth year] (81 y.o. Male) Treating RN: Huel Coventry Primary Care Physician: Vonita Moss Other Clinician: Referring Physician: Vonita Moss Treating Physician/Extender: Rudene Re in Treatment: 1 Education Assessment Education Provided To: Patient Education Topics Provided Wound/Skin Impairment: Handouts: Caring for Your Ulcer, Other: continue unna boots as prescribed Electronic Signature(s) Signed: 01/21/2015  5:07:03 PM By: Elliot Gurney, RN, BSN, Kim RN, BSN Entered By: Elliot Gurney, RN, BSN, Kim on 01/21/2015 13:50:26 [name]  ([id number]) -------------------------------------------------------------------------------- Wound Assessment Details Patient Name: Chris Riding K. Date of Service: 01/21/2015 1:00 PM Medical Record Number: [id number] Patient Account Number: [id number] Date of Birth/Sex: June 04, 1925 (81 y.o. Male) Treating RN: Huel Coventry Primary Care Physician: Vonita Moss Other Clinician: Referring Physician: Vonita Moss Treating Physician/Extender: Rudene Re in Treatment: 1 Wound Status Wound Number: 3 Primary Diabetic Wound/Ulcer of the Lower Etiology: Extremity Wound Location: Right Lower Leg - Posterior Wound Status: Open Wounding Event: Gradually Appeared Comorbid Hypertension, Type II Diabetes, Date Acquired: 12/22/2014 History: Osteoarthritis Weeks Of Treatment: 1 Clustered Wound: No Wound Measurements Length: (cm) 0.6 Width: (cm) 0.5 Depth: (cm) 0.1 Area: (cm) 0.236 Volume: (cm) 0.024 % Reduction in Area: 80% % Reduction in Volume: 79.7% Epithelialization: Small (1-33%) Tunneling: No Undermining: No Wound Description Classification: Grade 1 Wound Margin: Distinct, outline attached Exudate Amount: Small Exudate Type: Sanguinous Exudate Color: red Foul Odor After Cleansing: No Wound Bed Granulation Amount: Large (67-100%) Exposed Structure Granulation Quality: Red Fascia Exposed: No Necrotic Amount: None Present (0%) Fat Layer Exposed: No Tendon Exposed: No Muscle Exposed: No Joint Exposed: No Bone Exposed: No Limited to Skin Breakdown Periwound Skin Texture Texture Color No Abnormalities Noted: No No Abnormalities Noted: No Callus: No Atrophie Blanche: No Crepitus: No Cyanosis: No Excoriation: No Ecchymosis: No Fluctuance: No Erythema: No Kral, Mical K. ([id number]) Friable: No Hemosiderin Staining: No Induration: No Mottled: No Localized Edema: No Pallor: No Rash: No Rubor: No Scarring: No Temperature / Pain Moisture Temperature:  No Abnormality No Abnormalities Noted: No Dry / Scaly: No Maceration: No Moist: Yes Wound Preparation Ulcer Cleansing: Rinsed/Irrigated with Saline Topical Anesthetic Applied: None Electronic Signature(s) Signed: 01/21/2015 5:07:03 PM By: Elliot Gurney, RN, BSN, Kim RN, BSN Entered By: Elliot Gurney, RN, BSN, Kim on 01/21/2015 13:23:58 Rack, [name] ([id number]) -------------------------------------------------------------------------------- Wound Assessment Details Patient Name: Chris Riding K. Date of Service: 01/21/2015 1:00 PM Medical Record Number: [id number] Patient Account Number: [id number] Date of Birth/Sex: 1925/04/27 (81 y.o. Male) Treating RN: Huel Coventry Primary Care Physician: Vonita Moss Other Clinician: Referring Physician: Vonita Moss Treating Physician/Extender: Rudene Re in Treatment: 1 Wound Status Wound Number: 4 Primary Diabetic Wound/Ulcer of the Lower Etiology: Extremity Wound Location: Right Toe Fifth - Dorsal Wound Status: Healed - Epithelialized Wounding Event: Gradually Appeared Comorbid Hypertension, Type II Diabetes, Date Acquired: 01/12/2015 History: Osteoarthritis Weeks Of Treatment: 1 Clustered Wound: No Wound Measurements Length: (cm) Width: (cm) Depth: (cm) Area: (cm) Volume: (cm) 0 % Reduction in Area: 100% 0 % Reduction in Volume: 100% 0 Epithelialization: None 0 0 Wound Description Classification: Grade 1 Wound Margin: Distinct, outline attached Exudate Amount: Small Exudate Type: Sanguinous Exudate Color: red Foul Odor After Cleansing: No Wound Bed Granulation Amount: Large (67-100%) Exposed Structure Granulation Quality: Red Fascia Exposed: No Necrotic Amount: None Present (0%) Fat Layer Exposed: No Tendon Exposed: No Muscle Exposed: No Joint Exposed: No Bone Exposed: No Limited to Skin Breakdown Periwound Skin Texture Texture Color No Abnormalities Noted: No No Abnormalities Noted: No Callus: No Atrophie  Blanche: No Crepitus: No Cyanosis: No Excoriation: No Ecchymosis: No Fluctuance: No Erythema: No Cappelletti, Mick K. ([id number]) Friable: No Hemosiderin Staining: No Induration: No Mottled: No Localized Edema: No Pallor: No Rash: No Rubor: No Scarring: No Temperature / Pain Moisture Temperature: No Abnormality No Abnormalities Noted: No Dry / Scaly: No Maceration: No Moist: Yes Wound Preparation Ulcer Cleansing: Rinsed/Irrigated with Saline  Topical Anesthetic Applied: None Assessment Notes toenail fell off. Electronic Signature(s) Signed: 01/21/2015 5:07:03 PM By: Elliot Gurney, RN, BSN, Kim RN, BSN Entered By: Elliot Gurney, RN, BSN, Kim on 01/21/2015 13:24:35 [name] ([id number]) -------------------------------------------------------------------------------- Vitals Details Patient Name: Chris Riding K. Date of Service: 01/21/2015 1:00 PM Medical Record Number: [id number] Patient Account Number: [id number] Date of Birth/Sex: 21-Oct-1924 (81 y.o. Male) Treating RN: Huel Coventry Primary Care Physician: Vonita Moss Other Clinician: Referring Physician: Vonita Moss Treating Physician/Extender: Rudene Re in Treatment: 1 Vital Signs Time Taken: 13:11 Temperature (F): 97.5 Pulse (bpm): 88 Respiratory Rate (breaths/min): 18 Blood Pressure (mmHg): 136/78 Reference Range: 80 - 120 mg / dl Electronic Signature(s) Signed: 01/21/2015 5:07:03 PM By: Elliot Gurney, RN, BSN, Kim RN, BSN Entered By: Elliot Gurney, RN, BSN, Kim on 01/21/2015 13:14:34

## 2015-01-22 NOTE — Progress Notes (Signed)
[name] ([id number]) Visit Report for 01/21/2015 Chief Complaint Document Details Patient Name: [name], [name]. Date of Service: 01/21/2015 1:00 PM Medical Record Number: [id number] Patient Account Number: [id number] Date of Birth/Sex: 24-May-1925 (81 y.o. Male) Treating RN: Huel Coventry Primary Care Physician: Vonita Moss Other Clinician: Referring Physician: Vonita Moss Treating Physician/Extender: Rudene Re in Treatment: 1 Information Obtained from: Patient Chief Complaint Has had swelling of both lower extremities with some ulceration on the right lower extremity. Electronic Signature(s) Signed: 01/21/2015 4:25:01 PM By: Evlyn Kanner MD, FACS Entered By: Evlyn Kanner on 01/21/2015 13:49:43 [name] ([id number]) -------------------------------------------------------------------------------- HPI Details Patient Name: Chris Riding K. Date of Service: 01/21/2015 1:00 PM Medical Record Number: [id number] Patient Account Number: [id number] Date of Birth/Sex: Apr 15, 1925 (81 y.o. Male) Treating RN: Huel Coventry Primary Care Physician: Vonita Moss Other Clinician: Referring Physician: Vonita Moss Treating Physician/Extender: Rudene Re in Treatment: 1 History of Present Illness Location: had swelling of the right lower extremity with some ulceration on the medial part of his calf and on the right fifth toe Quality: Patient reports No Pain. Severity: Patient states wound (s) are getting better. Duration: Patient has had the wound for < 2 weeks prior to presenting for treatment Context: The wound appeared gradually over time. nail on his right fifth toe came off loose and he pried it Modifying Factors: Other treatment(s) tried include:vascular workup where he is going to be seeing Dr. Wyn Quaker on Monday Associated Signs and Symptoms: Patient reports presence of swelling HPI Description: Very pleasant 81 year old with a past medical history  significant for type 2 diabetes. He underwent left knee surgery in February 2015. He had an angioplasty of his R posterior tibial on 10/30/13. Vascular workup done in April of this year showed an ABI on the right to be noncompressible and the left to be 0.86. About a week ago he noticed some swelling on the right lower extremity and then an ulceration on the medial part of his calf. He also noted that the right fifth nail bed had a loose nail and he pried it open and now has an open wound. He has no evidence of cellulitis. he was seen by the nurse practitioner at his PCPs office and put on Bactroban ointment and doxycycline for 10 days. 01/21/2015 -- he was seen by Dr. Lorretta Harp and though we do not have the note, we know he had used an Unna's boot and asked him to come to see as again for a compression bandage. The patient has no fresh complaints. Electronic Signature(s) Signed: 01/21/2015 4:25:01 PM By: Evlyn Kanner MD, FACS Entered By: Evlyn Kanner on 01/21/2015 13:51:09 [name] ([id number]) -------------------------------------------------------------------------------- Physical Exam Details Patient Name: Chris Riding K. Date of Service: 01/21/2015 1:00 PM Medical Record Number: [id number] Patient Account Number: [id number] Date of Birth/Sex: 02-08-1925 (81 y.o. Male) Treating RN: Huel Coventry Primary Care Physician: Vonita Moss Other Clinician: Referring Physician: Vonita Moss Treating Physician/Extender: Rudene Re in Treatment: 1 Constitutional . Pulse regular. Respirations normal and unlabored. Afebrile. . Eyes Nonicteric. Reactive to light. Ears, Nose, Mouth, and Throat Lips, teeth, and gums WNL.Marland Kitchen Moist mucosa without lesions . Neck supple and nontender. No palpable supraclavicular or cervical adenopathy. Normal sized without goiter. Respiratory WNL. No retractions.. Cardiovascular Pedal Pulses WNL. No clubbing, cyanosis or edema. Chest Breasts  symmetical and no nipple discharge.. Breast tissue WNL, no masses, lumps, or tenderness.. Lymphatic No adneopathy. No adenopathy. No adenopathy. Musculoskeletal Adexa without tenderness or enlargement.. Digits and nails w/o  clubbing, cyanosis, infection, petechiae, ischemia, or inflammatory conditions.. Integumentary (Hair, Skin) No suspicious lesions. No crepitus or fluctuance. No peri-wound warmth or erythema. No masses.Marland Kitchen Psychiatric Judgement and insight Intact.. No evidence of depression, anxiety, or agitation.. Notes The edema of his right lower extremity has gone up significantly and his ulceration is smaller and cleaner. Electronic Signature(s) Signed: 01/21/2015 4:25:01 PM By: Evlyn Kanner MD, FACS Entered By: Evlyn Kanner on 01/21/2015 13:52:45 [name] ([id number]) -------------------------------------------------------------------------------- Physician Orders Details Patient Name: Chris Riding K. Date of Service: 01/21/2015 1:00 PM Medical Record Number: [id number] Patient Account Number: [id number] Date of Birth/Sex: [birth year] (81 y.o. Male) Treating RN: Huel Coventry Primary Care Physician: Vonita Moss Other Clinician: Referring Physician: Vonita Moss Treating Physician/Extender: Rudene Re in Treatment: 1 Verbal / Phone Orders: Yes Clinician: Huel Coventry Read Back and Verified: Yes Diagnosis Coding Wound Cleansing Wound #3 Right,Posterior Lower Leg o Clean wound with Normal Saline. o May shower with protection. Primary Wound Dressing Wound #3 Right,Posterior Lower Leg o Prisma Ag Secondary Dressing Wound #3 Right,Posterior Lower Leg o ABD pad Dressing Change Frequency Wound #3 Right,Posterior Lower Leg o Change dressing every week Follow-up Appointments Wound #3 Right,Posterior Lower Leg o Return Appointment in 1 week. Edema Control Wound #3 Right,Posterior Lower Leg o Unna Boot to Right Lower Extremity Electronic  Signature(s) Signed: 01/21/2015 4:25:01 PM By: Evlyn Kanner MD, FACS Signed: 01/21/2015 5:07:03 PM By: Elliot Gurney RN, BSN, Kim RN, BSN Entered By: Elliot Gurney, RN, BSN, Kim on 01/21/2015 13:35:10 Mccomb, [name] ([id number]) -------------------------------------------------------------------------------- Problem List Details Patient Name: Chris Riding K. Date of Service: 01/21/2015 1:00 PM Medical Record Number: [id number] Patient Account Number: [id number] Date of Birth/Sex: 1925-02-07 (81 y.o. Male) Treating RN: Huel Coventry Primary Care Physician: Vonita Moss Other Clinician: Referring Physician: Vonita Moss Treating Physician/Extender: Rudene Re in Treatment: 1 Active Problems ICD-10 Encounter Code Description Active Date Diagnosis E11.622 Type 2 diabetes mellitus with other skin ulcer 01/14/2015 Yes E11.51 Type 2 diabetes mellitus with diabetic peripheral 01/14/2015 Yes angiopathy without gangrene L97.212 Non-pressure chronic ulcer of right calf with fat layer 01/14/2015 Yes exposed L97.512 Non-pressure chronic ulcer of other part of right foot with 01/14/2015 Yes fat layer exposed I89.0 Lymphedema, not elsewhere classified 01/14/2015 Yes Inactive Problems Resolved Problems Electronic Signature(s) Signed: 01/21/2015 4:25:01 PM By: Evlyn Kanner MD, FACS Entered By: Evlyn Kanner on 01/21/2015 13:49:35 Mudgett, [name] ([id number]) -------------------------------------------------------------------------------- Progress Note Details Patient Name: Chris Riding K. Date of Service: 01/21/2015 1:00 PM Medical Record Number: [id number] Patient Account Number: [id number] Date of Birth/Sex: [birth year] (81 y.o. Male) Treating RN: Huel Coventry Primary Care Physician: Vonita Moss Other Clinician: Referring Physician: Vonita Moss Treating Physician/Extender: Rudene Re in Treatment: 1 Subjective Chief Complaint Information obtained from Patient Has had swelling of  both lower extremities with some ulceration on the right lower extremity. History of Present Illness (HPI) The following HPI elements were documented for the patient's wound: Location: had swelling of the right lower extremity with some ulceration on the medial part of his calf and on the right fifth toe Quality: Patient reports No Pain. Severity: Patient states wound (s) are getting better. Duration: Patient has had the wound for < 2 weeks prior to presenting for treatment Context: The wound appeared gradually over time. nail on his right fifth toe came off loose and he pried it Modifying Factors: Other treatment(s) tried include:vascular workup where he is going to be seeing Dr. Wyn Quaker on Monday Associated Signs and Symptoms: Patient reports presence of swelling Very  pleasant 81 year old with a past medical history significant for type 2 diabetes. He underwent left knee surgery in February 2015. He had an angioplasty of his R posterior tibial on 10/30/13. Vascular workup done in April of this year showed an ABI on the right to be noncompressible and the left to be 0.86. About a week ago he noticed some swelling on the right lower extremity and then an ulceration on the medial part of his calf. He also noted that the right fifth nail bed had a loose nail and he pried it open and now has an open wound. He has no evidence of cellulitis. he was seen by the nurse practitioner at his PCPs office and put on Bactroban ointment and doxycycline for 10 days. 01/21/2015 -- he was seen by Dr. Lorretta Harp and though we do not have the note, we know he had used an Unna's boot and asked him to come to see as again for a compression bandage. The patient has no fresh complaints. Objective Constitutional Pulse regular. Respirations normal and unlabored. Afebrile. Zacarias, Shivan K. ([id number]) Vitals Time Taken: 1:11 PM, Temperature: 97.5 F, Pulse: 88 bpm, Respiratory Rate: 18 breaths/min, Blood Pressure:  136/78 mmHg. Eyes Nonicteric. Reactive to light. Ears, Nose, Mouth, and Throat Lips, teeth, and gums WNL.Marland Kitchen Moist mucosa without lesions . Neck supple and nontender. No palpable supraclavicular or cervical adenopathy. Normal sized without goiter. Respiratory WNL. No retractions.. Cardiovascular Pedal Pulses WNL. No clubbing, cyanosis or edema. Chest Breasts symmetical and no nipple discharge.. Breast tissue WNL, no masses, lumps, or tenderness.. Lymphatic No adneopathy. No adenopathy. No adenopathy. Musculoskeletal Adexa without tenderness or enlargement.. Digits and nails w/o clubbing, cyanosis, infection, petechiae, ischemia, or inflammatory conditions.Marland Kitchen Psychiatric Judgement and insight Intact.. No evidence of depression, anxiety, or agitation.. General Notes: The edema of his right lower extremity has gone up significantly and his ulceration is smaller and cleaner. Integumentary (Hair, Skin) No suspicious lesions. No crepitus or fluctuance. No peri-wound warmth or erythema. No masses.. Wound #3 status is Open. Original cause of wound was Gradually Appeared. The wound is located on the Right,Posterior Lower Leg. The wound measures 0.6cm length x 0.5cm width x 0.1cm depth; 0.236cm^2 area and 0.024cm^3 volume. The wound is limited to skin breakdown. There is no tunneling or undermining noted. There is a small amount of sanguinous drainage noted. The wound margin is distinct with the outline attached to the wound base. There is large (67-100%) red granulation within the wound bed. There is no necrotic tissue within the wound bed. The periwound skin appearance exhibited: Moist. The periwound skin appearance did not exhibit: Callus, Crepitus, Excoriation, Fluctuance, Friable, Induration, Localized Edema, Rash, Scarring, Dry/Scaly, Maceration, Atrophie Blanche, Cyanosis, Ecchymosis, Hemosiderin Staining, Mottled, Pallor, Rubor, Erythema. Periwound temperature was noted as No  Abnormality. Wound #4 status is Healed - Epithelialized. Original cause of wound was Gradually Appeared. The wound Nish, Leah K. ([id number]) is located on the Right,Dorsal Toe Fifth. The wound measures 0cm length x 0cm width x 0cm depth; 0cm^2 area and 0cm^3 volume. The wound is limited to skin breakdown. There is a small amount of sanguinous drainage noted. The wound margin is distinct with the outline attached to the wound base. There is large (67-100%) red granulation within the wound bed. There is no necrotic tissue within the wound bed. The periwound skin appearance exhibited: Moist. The periwound skin appearance did not exhibit: Callus, Crepitus, Excoriation, Fluctuance, Friable, Induration, Localized Edema, Rash, Scarring, Dry/Scaly, Maceration, Atrophie Blanche, Cyanosis, Ecchymosis, Hemosiderin Staining, Mottled, Pallor,  Rubor, Erythema. Periwound temperature was noted as No Abnormality. General Notes: toenail fell off. Assessment Active Problems ICD-10 E11.622 - Type 2 diabetes mellitus with other skin ulcer E11.51 - Type 2 diabetes mellitus with diabetic peripheral angiopathy without gangrene L97.212 - Non-pressure chronic ulcer of right calf with fat layer exposed L97.512 - Non-pressure chronic ulcer of other part of right foot with fat layer exposed I89.0 - Lymphedema, not elsewhere classified We will use Prisma over the wound and apply an Unna's boot. He is going to see his vascular surgeon on this coming Thursday and I have asked him to come back and see [place] the week after if he wants [place] to continue looking after his wound care. He says is not very compliant and be back for regular follow-ups. Plan Wound Cleansing: Wound #3 Right,Posterior Lower Leg: Clean wound with Normal Saline. May shower with protection. Primary Wound Dressing: Wound #3 Right,Posterior Lower Leg: Prisma Ag Secondary Dressing: Wound #3 Right,Posterior Lower Leg: ABD pad Dressing Change  Frequency: [place], [place] K. ([id number]) Wound #3 Right,Posterior Lower Leg: Change dressing every week Follow-up Appointments: Wound #3 Right,Posterior Lower Leg: Return Appointment in 1 week. Edema Control: Wound #3 Right,Posterior Lower Leg: Unna Boot to Right Lower Extremity We will use Prisma over the wound and apply an Unna's boot. He is going to see his vascular surgeon on this coming Thursday and I have asked him to come back and see [place] the week after if he wants [place] to continue looking after his wound care. He says is not very compliant and be back for regular follow-ups. Electronic Signature(s) Signed: 01/21/2015 4:25:01 PM By: Evlyn Kanner MD, FACS Entered By: Evlyn Kanner on 01/21/2015 13:54:19 [name] ([id number]) -------------------------------------------------------------------------------- SuperBill Details Patient Name: Chris Riding K. Date of Service: 01/21/2015 Medical Record Number: [id number] Patient Account Number: [id number] Date of Birth/Sex: 1924/09/13 (81 y.o. Male) Treating RN: Huel Coventry Primary Care Physician: Vonita Moss Other Clinician: Referring Physician: Vonita Moss Treating Physician/Extender: Rudene Re in Treatment: 1 Diagnosis Coding ICD-10 Codes Code Description E11.622 Type 2 diabetes mellitus with other skin ulcer E11.51 Type 2 diabetes mellitus with diabetic peripheral angiopathy without gangrene L97.212 Non-pressure chronic ulcer of right calf with fat layer exposed L97.512 Non-pressure chronic ulcer of other part of right foot with fat layer exposed I89.0 Lymphedema, not elsewhere classified Facility Procedures CPT4 Code: 08657846 Description: (Facility Use Only) [phone] - APPLY Roland Rack BOOT RT Modifier: Quantity: 1 Physician Procedures CPT4: Description Modifier Quantity Code [id number] - WC PHYS LEVEL 3 - EST PT 1 ICD-10 Description Diagnosis E11.622 Type 2 diabetes mellitus with other skin ulcer L97.212  Non-pressure chronic ulcer of right calf with fat layer exposed E11.51 Type 2  diabetes mellitus with diabetic peripheral angiopathy without gangrene I89.0 Lymphedema, not elsewhere classified Electronic Signature(s) Signed: 01/21/2015 4:25:01 PM By: Evlyn Kanner MD, FACS Entered By: Evlyn Kanner on 01/21/2015 13:54:40

## 2015-02-08 ENCOUNTER — Other Ambulatory Visit: Payer: Self-pay | Admitting: Cardiovascular Disease

## 2015-02-15 ENCOUNTER — Telehealth: Payer: Self-pay

## 2015-02-15 NOTE — Telephone Encounter (Signed)
Pt called stated he is on Genuva, wants to know if RX can be changed as the price has gone from $45 to $165. Please call pt if further information is needed. Thanks.

## 2015-02-16 NOTE — Telephone Encounter (Signed)
Is there anything you can easily change this patient to from Januvia (due to cost)

## 2015-02-16 NOTE — Telephone Encounter (Signed)
Patient will call pharmacy and insurance, and call back

## 2015-02-16 NOTE — Telephone Encounter (Signed)
[facility] is usually one of the cheaper ones of those- if he can check with his pharmacy/insurance and ask if any of the DPP-4 are covered I'll be happy to switch.

## 2015-02-17 ENCOUNTER — Ambulatory Visit (INDEPENDENT_AMBULATORY_CARE_PROVIDER_SITE_OTHER): Payer: PPO | Admitting: Podiatry

## 2015-02-17 DIAGNOSIS — E119 Type 2 diabetes mellitus without complications: Secondary | ICD-10-CM

## 2015-02-17 DIAGNOSIS — M79676 Pain in unspecified toe(s): Secondary | ICD-10-CM

## 2015-02-17 DIAGNOSIS — E1165 Type 2 diabetes mellitus with hyperglycemia: Secondary | ICD-10-CM

## 2015-02-17 DIAGNOSIS — B351 Tinea unguium: Secondary | ICD-10-CM | POA: Diagnosis not present

## 2015-02-17 NOTE — Progress Notes (Signed)
Patient ID: [name], male   DOB: 01/23/1925, 81 y.o.   MRN: [id number]  Subjective: 81 y.o.-year-old male returns the office today for painful, elongated, thickened toenails which he is unable to trim himself. Denies any redness or drainage around the nails. Denies any acute changes since last appointment and no new complaints today. Denies any systemic complaints such as fevers, chills, nausea, vomiting.   He states he is currently being seen by vascular surgery for swelling to his legs. He states the swelling to his legs is lightly improved compared to what it was previously. He also states that he was treated for an infection to the right leg which is greatly improved.   Objective: AAO 3, NAD DP/PT pulses palpable, CRT less than 3 seconds Protective sensation intact with Simms Weinstein monofilament Nails hypertrophic, dystrophic, elongated, brittle, discolored 10. There is tenderness overlying the nails 1-5 bilaterally. There is no surrounding erythema or drainage along the nail sites. There are multiple pre-ulcerative lesions to bilateral anterior legs mostly on the right side. There is a faint amount of erythema around each lesion likely from inflammation however there is no drainage or purulence expressed. There is no increase in warmth. No swelling. No ascending saline disc. No malodor. No other open lesions or pre-ulcerative lesions are identified. There is chronic bilateral lower extremity pitting edema. Subjective the patient states this greatly improved compared to what it was previously. No other areas of tenderness bilateral lower extremities. No overlying edema, erythema, increased warmth. No pain with calf compression, swelling, warmth, erythema.  Assessment: Patient presents with symptomatic onychomycosis  Plan: -Treatment options including alternatives, risks, complications were discussed -Nails sharply debrided 10 without complication/bleeding. -Continue follow up  with vascular surgery for swelling. -Discussed daily foot inspection. If there are any changes, to call the office immediately.  -Follow-up in 3 months or sooner if any problems are to arise. In the meantime, encouraged to call the office with any questions, concerns, changes symptoms.  *At today's appointment the patient was upset with the office in regards to his scheduled appointment time. He was scheduled for 1:30 appointment however the office was closed for lunch and that time. He was checked at 1:50. Him and his son became very angry toward the office staff. He was demanding that his copayment be waived. I directed him to discuss his concerns with our office manager and he was given the name and phone number. Upon leaving they state they do not want a follow-up appointment with the office. They took the AVS papers and through them in the parking lot. They were picked up and discarded appropriately.

## 2015-02-28 ENCOUNTER — Telehealth: Payer: Self-pay

## 2015-02-28 MED ORDER — TAMSULOSIN HCL 0.4 MG PO CAPS: 0.4000 mg | ORAL_CAPSULE | Freq: Every day | ORAL | Status: DC

## 2015-02-28 NOTE — Telephone Encounter (Signed)
[facility] requesting refill  Flomax 0.4mg  cap

## 2015-03-02 ENCOUNTER — Telehealth: Payer: Self-pay | Admitting: Family Medicine

## 2015-03-02 NOTE — Telephone Encounter (Signed)
Pt calling inquiring about RX for Flomax. Pharm is [facility]. Thanks.

## 2015-04-12 ENCOUNTER — Other Ambulatory Visit: Payer: Self-pay | Admitting: Cardiovascular Disease

## 2015-04-12 ENCOUNTER — Other Ambulatory Visit: Payer: Self-pay

## 2015-04-12 MED ORDER — LEVOTHYROXINE SODIUM 175 MCG PO TABS: 175.0000 ug | ORAL_TABLET | Freq: Every day | ORAL | Status: DC

## 2015-04-12 NOTE — Telephone Encounter (Addendum)
LAST VISIT: 01/10/2015  Request for levothroxine sodium 175mcg and omeprazole 20mg .

## 2015-04-13 NOTE — Telephone Encounter (Signed)
This encounter was created in error - please disregard.

## 2015-04-14 ENCOUNTER — Telehealth: Payer: Self-pay

## 2015-04-14 MED ORDER — PANTOPRAZOLE SODIUM 40 MG PO TBEC: 40.0000 mg | DELAYED_RELEASE_TABLET | Freq: Every day | ORAL | Status: DC

## 2015-04-14 NOTE — Telephone Encounter (Signed)
Med change to Protonix as omeprazole can inactivate his Plavix which is his stop strokes medicine

## 2015-04-14 NOTE — Telephone Encounter (Signed)
Is there a reason that the patients omeprazole was denied, last written by CW on 11/20/2013 for 90 with 3 refills

## 2015-04-15 NOTE — Telephone Encounter (Signed)
[facility], she has discussed the change with the patient.

## 2015-05-02 ENCOUNTER — Other Ambulatory Visit: Payer: Self-pay | Admitting: Cardiovascular Disease

## 2015-05-09 ENCOUNTER — Other Ambulatory Visit: Payer: Self-pay | Admitting: Cardiovascular Disease

## 2015-05-19 ENCOUNTER — Ambulatory Visit: Payer: PPO

## 2015-05-19 ENCOUNTER — Ambulatory Visit: Payer: PPO | Admitting: Podiatry

## 2015-06-07 ENCOUNTER — Other Ambulatory Visit: Payer: Self-pay | Admitting: Cardiovascular Disease

## 2015-06-13 ENCOUNTER — Other Ambulatory Visit: Payer: Self-pay | Admitting: Cardiovascular Disease

## 2015-06-13 ENCOUNTER — Ambulatory Visit: Payer: PPO | Admitting: Cardiovascular Disease

## 2015-06-14 ENCOUNTER — Telehealth: Payer: Self-pay

## 2015-06-14 MED ORDER — SITAGLIPTIN PHOSPHATE 100 MG PO TABS: 100.0000 mg | ORAL_TABLET | Freq: Every day | ORAL | Status: DC

## 2015-06-14 NOTE — Telephone Encounter (Signed)
Needs an appointment. Will get him enough medicine to make it to appointment when it's booked.   

## 2015-06-14 NOTE — Telephone Encounter (Signed)
Opened in error, but patient did schedule appt.

## 2015-06-14 NOTE — Telephone Encounter (Signed)
Please call patient and schedule appointment, MJ will refill medication to get him to his appointment

## 2015-06-14 NOTE — Telephone Encounter (Signed)
[facility]  Januvia 100mg  Tab QD  Has not been seen here in a while

## 2015-06-14 NOTE — Telephone Encounter (Signed)
Rx sent to his pharmacy

## 2015-06-14 NOTE — Telephone Encounter (Signed)
Pt scheduled for Thursday 1/12.

## 2015-06-15 ENCOUNTER — Telehealth: Payer: Self-pay | Admitting: Family Medicine

## 2015-06-15 NOTE — Telephone Encounter (Signed)
Patient is out of his medication and can not make it to his appointment due to transportation. He wants to know if he can get enough until his next appointment.sitaGLIPtin (JANUVIA) 100 MG tablet

## 2015-06-15 NOTE — Telephone Encounter (Signed)
This was done by Dr. Laural BenesJohnson on 1/10

## 2015-06-16 ENCOUNTER — Ambulatory Visit: Payer: PPO | Admitting: Family Medicine

## 2015-06-22 DIAGNOSIS — M25519 Pain in unspecified shoulder: Secondary | ICD-10-CM | POA: Insufficient documentation

## 2015-07-11 ENCOUNTER — Ambulatory Visit (INDEPENDENT_AMBULATORY_CARE_PROVIDER_SITE_OTHER): Payer: PPO | Admitting: Family Medicine

## 2015-07-11 ENCOUNTER — Encounter: Payer: Self-pay | Admitting: Family Medicine

## 2015-07-11 VITALS — BP 99/63 | HR 78 | Temp 97.6°F

## 2015-07-11 DIAGNOSIS — E1165 Type 2 diabetes mellitus with hyperglycemia: Secondary | ICD-10-CM | POA: Diagnosis not present

## 2015-07-11 DIAGNOSIS — M899 Disorder of bone, unspecified: Secondary | ICD-10-CM | POA: Insufficient documentation

## 2015-07-11 DIAGNOSIS — I10 Essential (primary) hypertension: Secondary | ICD-10-CM

## 2015-07-11 DIAGNOSIS — M898X8 Other specified disorders of bone, other site: Secondary | ICD-10-CM

## 2015-07-11 DIAGNOSIS — M89319 Hypertrophy of bone, unspecified shoulder: Secondary | ICD-10-CM

## 2015-07-11 DIAGNOSIS — E119 Type 2 diabetes mellitus without complications: Secondary | ICD-10-CM | POA: Insufficient documentation

## 2015-07-11 MED ORDER — SITAGLIPTIN PHOSPHATE 100 MG PO TABS: 100.0000 mg | ORAL_TABLET | Freq: Every day | ORAL | Status: AC

## 2015-07-11 NOTE — Assessment & Plan Note (Signed)
We'll get chest x-ray

## 2015-07-11 NOTE — Assessment & Plan Note (Signed)
The current medical regimen is effective;  continue present plan and medications.  

## 2015-07-11 NOTE — Progress Notes (Signed)
BP 99/63 mmHg  Pulse 78  Temp(Src) 97.6 F (36.4 C)  Ht   Wt   SpO2 95%   Subjective:    Patient ID: [name], male    DOB: 27-Oct-1924, 82 y.o.   MRN: [id number]  HPI: [name] is a 82 y.o. male  Chief Complaint  Patient presents with  . Diabetes  . would like to stop B12 Injections   patient accompanied by his son and his primary history provider Patient doing well with B12 once a month no complaints reviewed chart patient with diagnosis of pernicious anemia will continue B12 injections monthly Diabetes no complaints noted low blood sugar spells needs refill on Januvia will get hemoglobin A1c and BMP  Patient fell approximately 2 months ago striking his right arm had some shoulder neck pain discomfort Over the last several days noticed a right sternum clavicular knot. This is nontender not red.   Patient's caregiver having multiple caregiving issues with patient unable to walk is in wheelchair moving around his house which is propelled by his caregiver are son. Patient also having difficulty getting in and out of bed which would be aided by a hospital bed Will start process to get these approved.  Relevant past medical, surgical, family and social history reviewed and updated as indicated. Interim medical history since our last visit reviewed. Allergies and medications reviewed and updated.  Review of Systems  Constitutional: Negative.   Respiratory: Negative.   Cardiovascular: Negative.     Per HPI unless specifically indicated above     Objective:    BP 99/63 mmHg  Pulse 78  Temp(Src) 97.6 F (36.4 C)  Ht   Wt   SpO2 95%  Wt Readings from Last 3 Encounters:  12/07/14 194 lb (87.998 kg)  06/23/14 195 lb (88.451 kg)  03/23/14 195 lb (88.451 kg)    Physical Exam  Constitutional: He is oriented to person, place, and time. He appears well-developed and well-nourished. No distress.  HENT:  Head: Normocephalic and atraumatic.  Right Ear: Hearing  normal.  Left Ear: Hearing normal.  Nose: Nose normal.  Eyes: Conjunctivae and lids are normal. Right eye exhibits no discharge. Left eye exhibits no discharge. No scleral icterus.  Cardiovascular: Normal rate, regular rhythm and normal heart sounds.   Pulmonary/Chest: Effort normal and breath sounds normal. No respiratory distress.  Musculoskeletal: Normal range of motion.  Neurological: He is alert and oriented to person, place, and time.  Skin: Skin is intact. No rash noted.  Psychiatric: He has a normal mood and affect. His speech is normal and behavior is normal. Judgment and thought content normal. Cognition and memory are normal.    Results for orders placed or performed in visit on 03/23/14  Basic Metabolic Panel (BMET)  Result Value Ref Range   Glucose 152 (H) 65 - 99 mg/dL   BUN 22 8 - 27 mg/dL   Creatinine, Ser 4.09 0.76 - 1.27 mg/dL   GFR calc non Af Amer 66 >59 mL/min/1.73   GFR calc Af Amer 76 >59 mL/min/1.73   BUN/Creatinine Ratio 22 10 - 22   Sodium 142 134 - 144 mmol/L   Potassium 4.0 3.5 - 5.2 mmol/L   Chloride 104 97 - 108 mmol/L   CO2 21 18 - 29 mmol/L   Calcium 8.9 8.6 - 10.2 mg/dL      Assessment & Plan:   Problem List Items Addressed This Visit      Cardiovascular and Mediastinum  Essential hypertension - Primary    The current medical regimen is effective;  continue present plan and medications.       Relevant Medications   sitaGLIPtin (JANUVIA) 100 MG tablet   Other Relevant Orders   Basic metabolic panel   Hgb A1c w/o eAG     Endocrine   Poorly controlled type 2 diabetes mellitus (HCC)   Relevant Medications   sitaGLIPtin (JANUVIA) 100 MG tablet   Diabetes mellitus without complication (HCC)    The current medical regimen is effective;  continue present plan and medications.       Relevant Medications   sitaGLIPtin (JANUVIA) 100 MG tablet   Other Relevant Orders   Basic metabolic panel   Hgb A1c w/o eAG     Other   Clavicle  enlargement    We'll get chest x-ray      Relevant Orders   DG Chest 2 View       Follow up plan: Return for Physical Exam.

## 2015-07-12 ENCOUNTER — Telehealth: Payer: Self-pay | Admitting: Family Medicine

## 2015-07-12 LAB — HGB A1C W/O EAG: HEMOGLOBIN A1C: 9.3 % — AB (ref 4.8–5.6)

## 2015-07-12 LAB — BASIC METABOLIC PANEL
BUN/Creatinine Ratio: 18 (ref 10–22)
BUN: 20 mg/dL (ref 8–27)
CALCIUM: 8.8 mg/dL (ref 8.6–10.2)
CO2: 27 mmol/L (ref 18–29)
CREATININE: 1.11 mg/dL (ref 0.76–1.27)
Chloride: 100 mmol/L (ref 96–106)
GFR calc Af Amer: 68 mL/min/{1.73_m2} (ref >59)
GFR, EST NON AFRICAN AMERICAN: 59 mL/min/{1.73_m2} — AB (ref >59)
GLUCOSE: 189 mg/dL — AB (ref 65–99)
Potassium: 4.4 mmol/L (ref 3.5–5.2)
Sodium: 141 mmol/L (ref 134–144)

## 2015-07-12 NOTE — Telephone Encounter (Signed)
-----   Message from Lurlean Horns, CMA sent at 07/12/2015  5:13 PM EST ----- labs

## 2015-07-12 NOTE — Telephone Encounter (Signed)
Phone call Discussed with patient glucose poor control patient's had a very bad diet over the holidays patient will do better with diet and wants to do that before changing or adding medications

## 2015-07-14 ENCOUNTER — Ambulatory Visit
Admission: RE | Admit: 2015-07-14 | Discharge: 2015-07-14 | Disposition: A | Discharge status: Home / Self Care | Payer: PPO | Source: Ambulatory Visit | Attending: Family Medicine | Admitting: Family Medicine

## 2015-07-14 DIAGNOSIS — M89319 Hypertrophy of bone, unspecified shoulder: Secondary | ICD-10-CM

## 2015-07-14 DIAGNOSIS — M898X1 Other specified disorders of bone, shoulder: Secondary | ICD-10-CM | POA: Diagnosis present

## 2015-07-20 ENCOUNTER — Other Ambulatory Visit
Admission: RE | Admit: 2015-07-20 | Discharge: 2015-07-20 | Disposition: A | Discharge status: Home / Self Care | Payer: PPO | Source: Ambulatory Visit | Attending: Internal Medicine | Admitting: Internal Medicine

## 2015-07-20 ENCOUNTER — Encounter: Payer: PPO | Attending: Internal Medicine | Admitting: Internal Medicine

## 2015-07-20 DIAGNOSIS — E11621 Type 2 diabetes mellitus with foot ulcer: Secondary | ICD-10-CM | POA: Insufficient documentation

## 2015-07-20 DIAGNOSIS — I10 Essential (primary) hypertension: Secondary | ICD-10-CM | POA: Diagnosis not present

## 2015-07-20 DIAGNOSIS — M1990 Unspecified osteoarthritis, unspecified site: Secondary | ICD-10-CM | POA: Diagnosis not present

## 2015-07-20 DIAGNOSIS — L97522 Non-pressure chronic ulcer of other part of left foot with fat layer exposed: Secondary | ICD-10-CM | POA: Diagnosis not present

## 2015-07-20 DIAGNOSIS — E1151 Type 2 diabetes mellitus with diabetic peripheral angiopathy without gangrene: Secondary | ICD-10-CM | POA: Insufficient documentation

## 2015-07-20 DIAGNOSIS — L97511 Non-pressure chronic ulcer of other part of right foot limited to breakdown of skin: Secondary | ICD-10-CM | POA: Diagnosis not present

## 2015-07-20 DIAGNOSIS — Z029 Encounter for administrative examinations, unspecified: Secondary | ICD-10-CM | POA: Diagnosis present

## 2015-07-20 DIAGNOSIS — G20 Parkinson's disease: Secondary | ICD-10-CM | POA: Diagnosis not present

## 2015-07-21 NOTE — Progress Notes (Signed)
[name], [name] ([id number]) Visit Report for 07/20/2015 Abuse/Suicide Risk Screen Details Patient Name: [name], [name]. Date of Service: 07/20/2015 8:45 AM Medical Record Patient Account Number: [id number] Number: Treating RN: Clover Mealy, RN, BSN, Rita 1924-08-07 [phone] y.o. Other Clinician: Date of Birth/Sex: Male) Treating ROBSON, MICHAEL Primary Care Physician: Vonita Moss Physician/Extender: G Referring Physician: Vonita Moss Weeks in Treatment: 0 Abuse/Suicide Risk Screen Items Answer ABUSE/SUICIDE RISK SCREEN: Has anyone close to you tried to hurt or harm you recentlyo No Do you feel uncomfortable with anyone in your familyo No Has anyone forced you do things that you didnot want to doo No Do you have any thoughts of harming yourselfo No Patient displays signs or symptoms of abuse and/or neglect. No Electronic Signature(s) Signed: 07/20/2015 5:09:16 PM By: Elpidio Eric BSN, RN Entered By: Elpidio Eric on 07/20/2015 08:39:42 [name] ([id number]) -------------------------------------------------------------------------------- Activities of Daily Living Details Patient Name: Chris Riding K. Date of Service: 07/20/2015 8:45 AM Medical Record Patient Account Number: [id number] Number: Treating RN: Clover Mealy, RN, BSN, Rita Sep 13, 1924 [phone] y.o. Other Clinician: Date of Birth/Sex: Male) Treating ROBSON, MICHAEL Primary Care Physician: Vonita Moss Physician/Extender: G Referring Physician: Vonita Moss Weeks in Treatment: 0 Activities of Daily Living Items Answer Activities of Daily Living (Please select one for each item) Drive Automobile Not Able Take Medications Need Assistance Use Telephone Need Assistance Care for Appearance Need Assistance Use Toilet Need Assistance Bath / Shower Need Assistance Dress Self Need Assistance Feed Self Need Assistance Walk Need Assistance Get In / Out Bed Need Assistance Housework Need Assistance Prepare  Meals Need Assistance Handle Money Need Assistance Shop for Self Need Assistance Electronic Signature(s) Signed: 07/20/2015 5:09:16 PM By: Elpidio Eric BSN, RN Entered By: Elpidio Eric on 07/20/2015 08:45:33 [name] ([id number]) -------------------------------------------------------------------------------- Education Assessment Details Patient Name: Chris Riding K. Date of Service: 07/20/2015 8:45 AM Medical Record Patient Account Number: [id number] Number: Treating RN: Clover Mealy, RN, BSN, Rita 01-Dec-1924 [phone] y.o. Other Clinician: Date of Birth/Sex: Male) Treating ROBSON, MICHAEL Primary Care Physician: Vonita Moss Physician/Extender: G Referring Physician: Vonita Moss Weeks in Treatment: 0 Primary Learner Assessed: Patient Learning Preferences/Education Level/Primary Language Learning Preference: Explanation Highest Education Level: College or Above Preferred Language: English Cognitive Barrier Assessment/Beliefs Language Barrier: No Physical Barrier Assessment Impaired Vision: Yes Glasses Impaired Hearing: No Knowledge/Comprehension Assessment Knowledge Level: Medium Comprehension Level: Medium Ability to understand written Medium instructions: Ability to understand verbal Medium instructions: Motivation Assessment Anxiety Level: Calm Cooperation: Cooperative Education Importance: Acknowledges Need Interest in Health Problems: Asks Questions Perception: Coherent Willingness to Engage in Self- Medium Management Activities: Readiness to Engage in Self- Medium Management Activities: Electronic Signature(s) Signed: 07/20/2015 5:09:16 PM By: Elpidio Eric BSN, RN Entered By: Elpidio Eric on 07/20/2015 08:40:15 [name] ([id number]) -------------------------------------------------------------------------------- Fall Risk Assessment Details Patient Name: [name]. Date of Service: 07/20/2015 8:45 AM Medical Record Patient Account  Number: [id number] Number: Treating RN: Clover Mealy, RN, BSN, Rita 28-Jun-1924 [phone] y.o. Other Clinician: Date of Birth/Sex: Male) Treating ROBSON, MICHAEL Primary Care Physician: Vonita Moss Physician/Extender: G Referring Physician: Vonita Moss Weeks in Treatment: 0 Fall Risk Assessment Items Have you had 2 or more falls in the last 12 monthso 0 No Have you had any fall that resulted in injury in the last 12 monthso 0 No FALL RISK ASSESSMENT: History of falling - immediate or within 3 months 0 No Secondary diagnosis 0 No Ambulatory aid None/bed rest/wheelchair/nurse 0 Yes Crutches/cane/walker 0 No Furniture 0 No IV Access/Saline Lock  0 No Gait/Training Normal/bed rest/immobile 0 Yes Weak 10 Yes Impaired 20 Yes Mental Status Oriented to own ability 0 Yes Electronic Signature(s) Signed: 07/20/2015 5:09:16 PM By: Elpidio Eric BSN, RN Entered By: Elpidio Eric on 07/20/2015 08:59:03 [name], [name] ([id number]) -------------------------------------------------------------------------------- Foot Assessment Details Patient Name: Chris Riding K. Date of Service: 07/20/2015 8:45 AM Medical Record Patient Account Number: [id number] Number: Treating RN: Clover Mealy, RN, BSN, Rita 1924-12-28 [phone] y.o. Other Clinician: Date of Birth/Sex: Male) Treating ROBSON, MICHAEL Primary Care Physician: Vonita Moss Physician/Extender: G Referring Physician: Vonita Moss Weeks in Treatment: 0 Foot Assessment Items Site Locations + = Sensation present, - = Sensation absent, C = Callus, U = Ulcer R = Redness, W = Warmth, M = Maceration, PU = Pre-ulcerative lesion F = Fissure, S = Swelling, D = Dryness Assessment Right: Left: Other Deformity: No No Prior Foot Ulcer: No No Prior Amputation: No No Charcot Joint: No No Ambulatory Status: Ambulatory With Help Assistance Device: Wheelchair Gait: Surveyor, mining) Signed: 07/20/2015 5:09:16 PM By: Elpidio Eric  BSN, RN Entered By: Elpidio Eric on 07/20/2015 08:41:48 [name] ([id number]) [name], [name] ([id number]) -------------------------------------------------------------------------------- Nutrition Risk Assessment Details Patient Name: Chris Riding K. Date of Service: 07/20/2015 8:45 AM Medical Record Patient Account Number: [id number] Number: Treating RN: Clover Mealy, RN, BSN, Rita 08/02/1924 [phone] y.o. Other Clinician: Date of Birth/Sex: Male) Treating ROBSON, MICHAEL Primary Care Physician: Vonita Moss Physician/Extender: G Referring Physician: Vonita Moss Weeks in Treatment: 0 Height (in): Weight (lbs): Body Mass Index (BMI): Nutrition Risk Assessment Items NUTRITION RISK SCREEN: I have an illness or condition that made me change the kind and/or 0 No amount of food I eat I eat fewer than two meals per day 0 No I eat few fruits and vegetables, or milk products 0 No I have three or more drinks of beer, liquor or wine almost every day 0 No I have tooth or mouth problems that make it hard for me to eat 0 No I don't always have enough money to buy the food I need 0 No I eat alone most of the time 0 No I take three or more different prescribed or over-the-counter drugs a 0 No day Without wanting to, I have lost or gained 10 pounds in the last six 0 No months I am not always physically able to shop, cook and/or feed myself 0 No Nutrition Protocols Good Risk Protocol 0 No interventions needed Moderate Risk Protocol Electronic Signature(s) Signed: 07/20/2015 5:09:16 PM By: Elpidio Eric BSN, RN Entered By: Elpidio Eric on 07/20/2015 08:41:35

## 2015-07-21 NOTE — Progress Notes (Signed)
[name] ([id number]) Visit Report for 07/20/2015 Chief Complaint Document Details Patient Name: [name], [name]. Date of Service: 07/20/2015 8:45 AM Medical Record Patient Account Number: [id number] Number: Treating RN: Baruch Gouty, RN, BSN, Rita 1925-04-15 [phone] y.o. Other Clinician: Date of Birth/Sex: Male) Treating [name] Primary Care Physician: Golden Pop Physician/Extender: G Referring Physician: Golden Pop Weeks in Treatment: 0 Information Obtained from: Patient Chief Complaint Has had swelling of both lower extremities with some ulceration on the right lower extremity. 07/20/15 patient returns today predominantly for a painful wound on the plantar aspect of his left foot of recent onset. Electronic Signature(s) Signed: 07/20/2015 5:04:49 PM By: [name] MD Entered By: [name] on 07/20/2015 10:31:37 [name] ([id number]) -------------------------------------------------------------------------------- Debridement Details Patient Name: Chris Bottoms K. Date of Service: 07/20/2015 8:45 AM Medical Record Patient Account Number: [id number] Number: Treating RN: Baruch Gouty, RN, BSN, Rita [birth year] [phone] y.o. Other Clinician: Date of Birth/Sex: Male) Treating [name], [place] Primary Care Physician: Jeananne Rama, MARK Physician/Extender: G Referring Physician: Golden Pop Weeks in Treatment: 0 Debridement Performed for Wound #5 Left Calcaneus Assessment: Performed By: Physician [name], MD Debridement: Debridement Pre-procedure Yes Verification/Time Out Taken: Start Time: 09:30 Pain Control: Lidocaine 4% Topical Solution Level: Skin/Subcutaneous Tissue Total Area Debrided (L x 5 (cm) x 2.2 (cm) = 11 (cm) W): Tissue and other Non-Viable, Callus, Fibrin/Slough, Subcutaneous material debrided: Instrument: Blade Bleeding: Minimum Hemostasis Achieved: Pressure End Time: 09:35 Procedural Pain: 0 Post  Procedural Pain: 0 Response to Treatment: Procedure was tolerated well Post Debridement Measurements of Total Wound Length: (cm) 5 Width: (cm) 2.2 Depth: (cm) 0.1 Volume: (cm) 0.864 Post Procedure Diagnosis Same as Pre-procedure Electronic Signature(s) Signed: 07/20/2015 5:04:49 PM By: [name] MD Signed: 07/20/2015 5:09:16 PM By: Regan Lemming BSN, RN Entered By: [name] on 07/20/2015 10:29:38 [name], [name] ([id number]) -------------------------------------------------------------------------------- Debridement Details Patient Name: Chris Bottoms K. Date of Service: 07/20/2015 8:45 AM Medical Record Patient Account Number: [id number] Number: Treating RN: Baruch Gouty, RN, BSN, Rita 1924/11/10 [phone] y.o. Other Clinician: Date of Birth/Sex: Male) Treating [name], [name] Primary Care Physician: Jeananne Rama, [place]: G Referring Physician: Golden Pop Weeks in Treatment: 0 Debridement Performed for Wound #6 Left,Plantar Foot Assessment: Performed By: Physician [name], MD Debridement: Debridement Pre-procedure Yes Verification/Time Out Taken: Start Time: 09:21 Pain Control: Lidocaine 4% Topical Solution Level: Skin/Subcutaneous Tissue Total Area Debrided (L x 2 (cm) x 2 (cm) = 4 (cm) W): Tissue and other Non-Viable, Callus, Fibrin/Slough, Subcutaneous material debrided: Instrument: Blade, Forceps Bleeding: Minimum Hemostasis Achieved: Pressure End Time: 09:26 Procedural Pain: 0 Post Procedural Pain: 0 Response to Treatment: Procedure was tolerated well Post Debridement Measurements of Total Wound Length: (cm) 2 Width: (cm) 2 Depth: (cm) 0.1 Volume: (cm) 0.314 Post Procedure Diagnosis Same as Pre-procedure Electronic Signature(s) Signed: 07/20/2015 5:04:49 PM By: [name] MD Signed: 07/20/2015 5:09:16 PM By: Regan Lemming BSN, RN Entered By: [name] on 07/20/2015 10:30:27 [name], [name]  ([id number]) -------------------------------------------------------------------------------- Debridement Details Patient Name: Chris Bottoms K. Date of Service: 07/20/2015 8:45 AM Medical Record Patient Account Number: [id number] Number: Treating RN: Baruch Gouty, RN, BSN, Rita [birth year] [phone] y.o. Other Clinician: Date of Birth/Sex: Male) Treating [name] Primary Care Physician: Golden Pop Physician/Extender: G Referring Physician: Golden Pop Weeks in Treatment: 0 Debridement Performed for Wound #7 Left Metatarsal head first Assessment: Performed By: Physician [name], MD Debridement: Debridement Pre-procedure Yes Verification/Time Out Taken: Start Time: 09:26 Pain Control: Lidocaine 4% Topical Solution Level: Skin/Subcutaneous Tissue  Total Area Debrided (L x 1 (cm) x 1.5 (cm) = 1.5 (cm) W): Tissue and other Non-Viable, Callus, Fibrin/Slough, Subcutaneous material debrided: Instrument: Blade Bleeding: Minimum Hemostasis Achieved: Pressure End Time: 09:30 Procedural Pain: 0 Post Procedural Pain: 0 Response to Treatment: Procedure was tolerated well Post Debridement Measurements of Total Wound Length: (cm) 1 Width: (cm) 1.5 Depth: (cm) 0.1 Volume: (cm) 0.118 Post Procedure Diagnosis Same as Pre-procedure Electronic Signature(s) Signed: 07/20/2015 5:04:49 PM By: [name] MD Signed: 07/20/2015 5:09:16 PM By: Regan Lemming BSN, RN Entered By: [name] on 07/20/2015 10:30:56 [name], [name] ([id number]) -------------------------------------------------------------------------------- HPI Details Patient Name: Chris Bottoms K. Date of Service: 07/20/2015 8:45 AM Medical Record Patient Account Number: [id number] Number: Treating RN: Baruch Gouty, RN, BSN, Rita 04/01/1925 [phone] y.o. Other Clinician: Date of Birth/Sex: Male) Treating Nasteho Glantz Primary Care Physician: Golden Pop Physician/Extender: G Referring  Physician: Golden Pop Weeks in Treatment: 0 History of Present Illness Location: had swelling of the right lower extremity with some ulceration on the medial part of his calf and on the right fifth toe Quality: Patient reports No Pain. Severity: Patient states wound (s) are getting better. Duration: Patient has had the wound for < 2 weeks prior to presenting for treatment Context: The wound appeared gradually over time. nail on his right fifth toe came off loose and he pried it Modifying Factors: Other treatment(s) tried include:vascular workup where he is going to be seeing Dr. Lucky Cowboy on Monday Associated Signs and Symptoms: Patient reports presence of swelling HPI Description: Very pleasant 82 year old with a past medical history significant for type 2 diabetes. He underwent left knee surgery in February 2015. He had an angioplasty of his R posterior tibial on 10/30/13. Vascular workup done in April of this year showed an ABI on the right to be noncompressible and the left to be 0.86. About a week ago he noticed some swelling on the right lower extremity and then an ulceration on the medial part of his calf. He also noted that the right fifth nail bed had a loose nail and he pried it open and now has an open wound. He has no evidence of cellulitis. he was seen by the nurse practitioner at his PCPs office and put on Bactroban ointment and doxycycline for 10 days. 01/21/2015 -- he was seen by Dr. Ronalee Belts and though we do not have the note, we know he had used an Unna's boot and asked him to come to see as again for a compression bandage. The patient has no fresh complaints. 07/20/15; the patient returns today predominantly for her wounds on his left plantar foot. He was seen here in the summer last on 8/19 for wounds on his right medial calf and right fifth nail bed. I don't know that he actually returned to be discharged, he is wearing compression on bilateral legs. He has known  peripheral arterial disease in the setting of type 2 diabetes having an angioplasty on the right posterior tibial artery on 10/30/13. His ABI calculated in the clinic today was 0.97 on the right. I don't think the left could be done. The patient tells me that roughly a week ago he started to develop pain in the plantar aspect of his left foot. I think his caregiver noticed the wounds and he is here for our review events. He is minimally ambulatory, spends most of his time in a wheelchair. I am not certain of the issue here. Electronic Signature(s) Signed: 07/20/2015 5:04:49 PM By: [name] MD  Entered By: [name] on 07/20/2015 10:34:29 Borror, [name] ([id number]) -------------------------------------------------------------------------------- Physical Exam Details Patient Name: KEIN, CARLBERG K. Date of Service: 07/20/2015 8:45 AM Medical Record Patient Account Number: [id number] Number: Treating RN: Baruch Gouty, RN, BSN, Rita 1924/11/01 [phone] y.o. Other Clinician: Date of Birth/Sex: Male) Treating Monai Hindes Primary Care Physician: Golden Pop Physician/Extender: G Referring Physician: Golden Pop Weeks in Treatment: 0 Constitutional Sitting or standing Blood Pressure is within target range for patient.. Pulse regular and within target range for patient.Marland Kitchen Respirations regular, non-labored and within target range.. Temperature is normal and within the target range for the patient.. Cardiovascular Pedal pulses absent bilaterally.. Edema present in both extremities.. Neurological Minimal sensation to the microfilament test in his bilateral feet. Notes The patient has several wounds on the left foot. Most problematic lately a thick callus over the left fourth metatarsal head plantar aspect. With debridement this had a relatively large amount of pus come out of the wound. This was sent for culture. He also had a wound on the lateral aspect of his right first  metatarsal head and a large denuded blister over the plantar aspect of his left heel. On the right side he had a small relatively benign looking wound over the dorsal PIP of his right first toe. Electronic Signature(s) Signed: 07/20/2015 5:04:49 PM By: [name] MD Entered By: [name] on 07/20/2015 10:36:34 [name] ([id number]) -------------------------------------------------------------------------------- Physician Orders Details Patient Name: Chris Bottoms K. Date of Service: 07/20/2015 8:45 AM Medical Record Patient Account Number: [id number] Number: Treating RN: Baruch Gouty, RN, BSN, Rita 1925-01-16 [phone] y.o. Other Clinician: Date of Birth/Sex: Male) Treating Camilah Spillman Primary Care Physician: Golden Pop Physician/Extender: G Referring Physician: Golden Pop Weeks in Treatment: 0 Verbal / Phone Orders: Yes Clinician: [place], RN, BSN, Rita Read Back and Verified: Yes Diagnosis Coding ICD-10 Coding Code Description [phone] Non-pressure chronic ulcer of other part of left foot with fat layer exposed L97.511 Non-pressure chronic ulcer of other part of right foot limited to breakdown of skin E11.621 Type 2 diabetes mellitus with foot ulcer E11.51 Type 2 diabetes mellitus with diabetic peripheral angiopathy without gangrene Wound Cleansing Wound #5 Left Calcaneus o Clean wound with Normal Saline. Wound #6 Left,Plantar Foot o Clean wound with Normal Saline. Wound #7 Left Metatarsal head first o Clean wound with Normal Saline. Wound #8 Right,Dorsal Toe Great o Clean wound with Normal Saline. Anesthetic Wound #5 Left Calcaneus o Topical Lidocaine 4% cream applied to wound bed prior to debridement Wound #6 Left,Plantar Foot o Topical Lidocaine 4% cream applied to wound bed prior to debridement Wound #7 Left Metatarsal head first o Topical Lidocaine 4% cream applied to wound bed prior to debridement Wound #8 Right,Dorsal Toe  Great o Topical Lidocaine 4% cream applied to wound bed prior to debridement Primary Wound Dressing Bayne, Safwan K. ([id number]) Wound #5 Left Calcaneus o Aquacel Ag Wound #6 Left,Plantar Foot o Aquacel Ag Wound #7 Left Metatarsal head first o Aquacel Ag Wound #8 Right,Dorsal Toe Great o Prisma Ag Secondary Dressing Wound #5 Left Calcaneus o Gauze and Kerlix/Conform Wound #6 Left,Plantar Foot o Gauze and Kerlix/Conform Wound #7 Left Metatarsal head first o Gauze and Kerlix/Conform Wound #8 Right,Dorsal Toe Great o Gauze and Kerlix/Conform Dressing Change Frequency Wound #5 Left Calcaneus o Change dressing every other day. Wound #6 Left,Plantar Foot o Change dressing every other day. Wound #7 Left Metatarsal head first o Change dressing every other day. Wound #8 Right,Dorsal Toe Great o Change dressing every other  day. Follow-up Appointments Wound #5 Left Calcaneus o Return Appointment in 1 week. Wound #6 Left,Plantar Foot o Return Appointment in 1 week. Wound #7 Left Metatarsal head first o Return Appointment in 1 week. [name], [name] ([id number]) Wound #8 Right,Dorsal Toe Great o Return Appointment in 1 week. Off-Loading Wound #5 Left Calcaneus o Heel suspension boot to: - [employer] Wound #6 Left,Plantar Foot o Heel suspension boot to: - [employer] Wound #7 Left Metatarsal head first o Heel suspension boot to: - [employer] Wound #8 Right,Dorsal Toe Great o Heel suspension boot to: - [place] #5 Left [place] for [place] Nurse may visit PRN to address patientos wound care needs. o FACE TO FACE ENCOUNTER: MEDICARE and MEDICAID PATIENTS: I certify that this patient is under my care and that I had a face-to-face encounter that meets the physician face-to-face encounter requirements with this patient on this date. The encounter with the  patient was in whole or in part for the following MEDICAL CONDITION: (primary reason for [place]) MEDICAL NECESSITY: I certify, that based on my findings, NURSING services are a medically necessary home health service. HOME BOUND STATUS: I certify that my clinical findings support that this patient is homebound (i.e., Due to illness or injury, pt requires aid of supportive devices such as crutches, cane, wheelchairs, walkers, the use of special transportation or the assistance of another person to leave their place of residence. There is a normal inability to leave the home and doing so requires considerable and taxing effort. Other absences are for medical reasons / religious services and are infrequent or of short duration when for other reasons). o If current dressing causes regression in wound condition, may D/C ordered dressing product/s and apply Normal Saline Moist Dressing daily until next [place] / Other MD appointment. [place] of regression in wound condition at [phone]. o Please direct any NON-WOUND related issues/requests for orders to patient's Primary Care Physician Wound #6 [place] for [place] Nurse may visit PRN to address patientos wound care needs. o FACE TO FACE ENCOUNTER: MEDICARE and MEDICAID PATIENTS: I certify that this patient is under my care and that I had a face-to-face encounter that meets the physician face-to-face encounter requirements with this patient on this date. The encounter with the patient was in whole or in part for the following MEDICAL CONDITION: (primary reason for [place]) MEDICAL NECESSITY: I certify, that based on my findings, NURSING services are a medically necessary home health service. HOME BOUND STATUS: I certify that my clinical findings support that this patient is homebound (i.e., Due to illness or injury, pt  requires aid of Carel, Odean K. ([id number]) supportive devices such as crutches, cane, wheelchairs, walkers, the use of special transportation or the assistance of another person to leave their place of residence. There is a normal inability to leave the home and doing so requires considerable and taxing effort. Other absences are for medical reasons / religious services and are infrequent or of short duration when for other reasons). o If current dressing causes regression in wound condition, may D/C ordered dressing product/s and apply Normal Saline Moist Dressing daily until next [place] / Other MD appointment. [place] of regression in wound condition at [phone]. o Please direct any NON-WOUND related issues/requests for orders to patient's Primary Care Physician  Wound #7 Left Metatarsal head first o [place] for [place] Nurse may visit PRN to address patientos wound care needs. o FACE TO FACE ENCOUNTER: MEDICARE and MEDICAID PATIENTS: I certify that this patient is under my care and that I had a face-to-face encounter that meets the physician face-to-face encounter requirements with this patient on this date. The encounter with the patient was in whole or in part for the following MEDICAL CONDITION: (primary reason for [place]) MEDICAL NECESSITY: I certify, that based on my findings, NURSING services are a medically necessary home health service. HOME BOUND STATUS: I certify that my clinical findings support that this patient is homebound (i.e., Due to illness or injury, pt requires aid of supportive devices such as crutches, cane, wheelchairs, walkers, the use of special transportation or the assistance of another person to leave their place of residence. There is a normal inability to leave the home and doing so requires considerable and taxing effort. Other absences are for medical  reasons / religious services and are infrequent or of short duration when for other reasons). o If current dressing causes regression in wound condition, may D/C ordered dressing product/s and apply Normal Saline Moist Dressing daily until next [place] / Other MD appointment. [place] of regression in wound condition at [phone]. o Please direct any NON-WOUND related issues/requests for orders to patient's Primary Care Physician Wound #8 Right,Dorsal Toe [place] for [place] Nurse may visit PRN to address patientos wound care needs. o FACE TO FACE ENCOUNTER: MEDICARE and MEDICAID PATIENTS: I certify that this patient is under my care and that I had a face-to-face encounter that meets the physician face-to-face encounter requirements with this patient on this date. The encounter with the patient was in whole or in part for the following MEDICAL CONDITION: (primary reason for [place]) MEDICAL NECESSITY: I certify, that based on my findings, NURSING services are a medically necessary home health service. HOME BOUND STATUS: I certify that my clinical findings support that this patient is homebound (i.e., Due to illness or injury, pt requires aid of supportive devices such as crutches, cane, wheelchairs, walkers, the use of special transportation or the assistance of another person to leave their place of residence. There is a normal inability to leave the home and doing so requires considerable and taxing effort. Other absences are for medical reasons / religious services and are infrequent or of short duration when for other reasons). o If current dressing causes regression in wound condition, may D/C ordered dressing product/s and apply Normal Saline Moist Dressing daily until next [place] / Other MD appointment. [place] of regression in wound condition  at [phone]. CARMEN, VALLECILLO ([id number]) o Please direct any NON-WOUND related issues/requests for orders to patient's Primary Care Physician Medications-please add to medication list. Wound #5 Left Calcaneus o P.O. Antibiotics - Doxy 176m BID x 7days Wound #6 Left,Plantar Foot o P.O. Antibiotics - Doxy 1054mBID x 7days Wound #7 Left Metatarsal head first o P.O. Antibiotics - Doxy 10071mID x 7days Wound #8 Right,Dorsal Toe Great o P.O. Antibiotics - Doxy 100m59mD x 7days Radiology o X-ray, foot - AP lateral. R/O osteomyelitis; non healing wound, foul smell drainage. Services and Therapies o Arterial Studies- Bilateral Custom Services o Cultures and Sensitivities - Obtained from left plantar foot 4th met head Notes Cultures done but  will not show on report; not populating under the drop box. Electronic Signature(s) Signed: 07/20/2015 10:16:37 AM By: Regan Lemming BSN, RN Signed: 07/20/2015 5:04:49 PM By: [name] MD Entered By: Regan Lemming on 07/20/2015 10:16:37 [name] ([id number]) -------------------------------------------------------------------------------- Problem List Details Patient Name: Chris Bottoms K. Date of Service: 07/20/2015 8:45 AM Medical Record Patient Account Number: [id number] Number: Treating RN: Baruch Gouty, RN, BSN, Rita 1924/10/09 [phone] y.o. Other Clinician: Date of Birth/Sex: Male) Treating Libby Goehring Primary Care Physician: Golden Pop Physician/Extender: G Referring Physician: Golden Pop Weeks in Treatment: 0 Active Problems ICD-10 Encounter Code Description Active Date Diagnosis L97.522 Non-pressure chronic ulcer of other part of left foot with fat 07/20/2015 Yes layer exposed L97.511 Non-pressure chronic ulcer of other part of right foot 07/20/2015 Yes limited to breakdown of skin E11.621 Type 2 diabetes mellitus with foot ulcer 07/20/2015 Yes E11.51 Type 2 diabetes mellitus with diabetic  peripheral 07/20/2015 Yes angiopathy without gangrene Inactive Problems Resolved Problems Electronic Signature(s) Signed: 07/20/2015 5:04:49 PM By: [name] MD Entered By: [name] on 07/20/2015 10:29:14 Rochon, [name] ([id number]) -------------------------------------------------------------------------------- Progress Note Details Patient Name: Chris Bottoms K. Date of Service: 07/20/2015 8:45 AM Medical Record Patient Account Number: [id number] Number: Treating RN: Baruch Gouty, RN, BSN, Rita May 05, 1925 [phone] y.o. Other Clinician: Date of Birth/Sex: Male) Treating Payeton Germani Primary Care Physician: Golden Pop Physician/Extender: G Referring Physician: Golden Pop Weeks in Treatment: 0 Subjective Chief Complaint Information obtained from Patient Has had swelling of both lower extremities with some ulceration on the right lower extremity. 07/20/15 patient returns today predominantly for a painful wound on the plantar aspect of his left foot of recent onset. History of Present Illness (HPI) The following HPI elements were documented for the patient's wound: Location: had swelling of the right lower extremity with some ulceration on the medial part of his calf and on the right fifth toe Quality: Patient reports No Pain. Severity: Patient states wound (s) are getting better. Duration: Patient has had the wound for < 2 weeks prior to presenting for treatment Context: The wound appeared gradually over time. nail on his right fifth toe came off loose and he pried it Modifying Factors: Other treatment(s) tried include:vascular workup where he is going to be seeing Dr. Lucky Cowboy on Monday Associated Signs and Symptoms: Patient reports presence of swelling Very pleasant 82 year old with a past medical history significant for type 2 diabetes. He underwent left knee surgery in February 2015. He had an angioplasty of his R posterior tibial on 10/30/13. Vascular workup  done in April of this year showed an ABI on the right to be noncompressible and the left to be 0.86. About a week ago he noticed some swelling on the right lower extremity and then an ulceration on the medial part of his calf. He also noted that the right fifth nail bed had a loose nail and he pried it open and now has an open wound. He has no evidence of cellulitis. he was seen by the nurse practitioner at his PCPs office and put on Bactroban ointment and doxycycline for 10 days. 01/21/2015 -- he was seen by Dr. Ronalee Belts and though we do not have the note, we know he had used an Unna's boot and asked him to come to see as again for a compression bandage. The patient has no fresh complaints. 07/20/15; the patient returns today predominantly for her wounds on his left plantar foot. He was seen here in the summer last on 8/19 for wounds  on his right medial calf and right fifth nail bed. I don't know that he actually returned to be discharged, he is wearing compression on bilateral legs. He has known peripheral arterial disease in the setting of type 2 diabetes having an angioplasty on the right posterior tibial artery on 10/30/13. His ABI calculated in the clinic today was 0.97 on the right. I don't think the left could be done. The patient tells me that roughly a week ago he started to develop pain in the plantar aspect of his left foot. I [name], [name] ([id number]) think his caregiver noticed the wounds and he is here for our review events. He is minimally ambulatory, spends most of his time in a wheelchair. I am not certain of the issue here. Wound History Patient presents with 3 open wounds that have been present for approximately weeks. Patient has been treating wounds in the following manner: open to air. Laboratory tests have not been performed in the last month. Patient reportedly has not tested positive for an antibiotic resistant organism. Patient reportedly has not tested positive for  osteomyelitis. Patient reportedly has not had testing performed to evaluate circulation in the legs. Patient experiences the following problems associated with their wounds: infection, swelling. Patient History Information obtained from Patient. Allergies No Known Drug Allergies Family History Diabetes - Siblings, Heart Disease - Father, Stroke - Mother, No family history of Cancer, Hereditary Spherocytosis, Hypertension, Kidney Disease, Lung Disease, Seizures, Thyroid Problems, Tuberculosis. Social History Never smoker, Marital Status - Widowed, Alcohol Use - Never, Drug Use - No History, Caffeine Use - Daily. Medical History Eyes Denies history of Cataracts, Glaucoma, Optic Neuritis Genitourinary Denies history of End Stage Renal Disease Integumentary (Skin) Patient has history of History of pressure wounds Denies history of History of Burn Neurologic Patient has history of Neuropathy Medical And Surgical History Notes Cardiovascular stent Endocrine hypothyrodism Genitourinary enlarged prostate Neurologic parkinson Review of Systems (ROS) Eyes Complains or has symptoms of Glasses / Contacts. Ear/Nose/Mouth/Throat [name], [name] ([id number]) The patient has no complaints or symptoms. Hematologic/Lymphatic The patient has no complaints or symptoms. Respiratory The patient has no complaints or symptoms. Cardiovascular The patient has no complaints or symptoms. Gastrointestinal The patient has no complaints or symptoms. Endocrine The patient has no complaints or symptoms. Genitourinary The patient has no complaints or symptoms. Immunological The patient has no complaints or symptoms. Integumentary (Skin) Complains or has symptoms of Wounds, Breakdown, Swelling. Musculoskeletal The patient has no complaints or symptoms. Neurologic The patient has no complaints or symptoms. Oncologic The patient has no complaints or symptoms. Psychiatric The patient has no  complaints or symptoms. Objective Constitutional Sitting or standing Blood Pressure is within target range for patient.. Pulse regular and within target range for patient.Marland Kitchen Respirations regular, non-labored and within target range.. Temperature is normal and within the target range for the patient.. Vitals Time Taken: 8:45 AM, Height: 72 in, Source: Stated, Weight: 195 lbs, Source: Stated, BMI: 26.4, Temperature: 97.7 F, Pulse: 72 bpm, Respiratory Rate: 18 breaths/min, Blood Pressure: 140/62 mmHg. Cardiovascular Pedal pulses absent bilaterally.. Edema present in both extremities.. Neurological Minimal sensation to the microfilament test in his bilateral feet. General Notes: The patient has several wounds on the left foot. Most problematic lately a thick callus over [name], [name] ([id number]) the left fourth metatarsal head plantar aspect. With debridement this had a relatively large amount of pus come out of the wound. This was sent for culture. He also had a wound on the lateral aspect  of his right first metatarsal head and a large denuded blister over the plantar aspect of his left heel. On the right side he had a small relatively benign looking wound over the dorsal PIP of his right first toe. Integumentary (Hair, Skin) Wound #5 status is Open. Original cause of wound was Blister. The wound is located on the Left Calcaneus. The wound measures 5cm length x 2.2cm width x 0.1cm depth; 8.639cm^2 area and 0.864cm^3 volume. The wound is limited to skin breakdown. There is no tunneling or undermining noted. There is a none present amount of drainage noted. The wound margin is distinct with the outline attached to the wound base. There is no granulation within the wound bed. There is a large (67-100%) amount of necrotic tissue within the wound bed including Eschar. The periwound skin appearance exhibited: Dry/Scaly. The periwound skin appearance did not exhibit: Callus, Crepitus,  Excoriation, Fluctuance, Friable, Induration, Localized Edema, Rash, Scarring, Maceration, Moist, Atrophie Blanche, Cyanosis, Ecchymosis, Hemosiderin Staining, Mottled, Pallor, Rubor, Erythema. Periwound temperature was noted as No Abnormality. The periwound has tenderness on palpation. Wound #6 status is Open. Original cause of wound was Gradually Appeared. The wound is located on the [place]. The wound measures 2cm length x 2cm width x 0.1cm depth; 3.142cm^2 area and 0.314cm^3 volume. The wound is limited to skin breakdown. There is no tunneling or undermining noted. There is a none present amount of drainage noted. The wound margin is indistinct and nonvisible. There is no granulation within the wound bed. There is a large (67-100%) amount of necrotic tissue within the wound bed including Eschar. The periwound skin appearance exhibited: Callus, Dry/Scaly. The periwound skin appearance did not exhibit: Crepitus, Excoriation, Fluctuance, Friable, Induration, Localized Edema, Rash, Scarring, Maceration, Moist, Atrophie Blanche, Cyanosis, Ecchymosis, Hemosiderin Staining, Mottled, Pallor, Rubor, Erythema. Periwound temperature was noted as No Abnormality. The periwound has tenderness on palpation. Wound #7 status is Open. Original cause of wound was Gradually Appeared. The wound is located on the Left Metatarsal head first. The wound measures 1cm length x 1.5cm width x 0.11cm depth; 1.178cm^2 area and 0.13cm^3 volume. The wound is limited to skin breakdown. There is no tunneling or undermining noted. There is a none present amount of drainage noted. The wound margin is indistinct and nonvisible. There is no granulation within the wound bed. There is a large (67-100%) amount of necrotic tissue within the wound bed including Eschar. The periwound skin appearance exhibited: Callus, Dry/Scaly. The periwound skin appearance did not exhibit: Crepitus, Excoriation, Fluctuance, Friable,  Induration, Localized Edema, Rash, Scarring, Maceration, Moist, Atrophie Blanche, Cyanosis, Ecchymosis, Hemosiderin Staining, Mottled, Pallor, Rubor, Erythema. Periwound temperature was noted as No Abnormality. The periwound has tenderness on palpation. Wound #8 status is Open. Original cause of wound was Gradually Appeared. The wound is located on the Right,Dorsal [employer] The wound measures 0.5cm length x 0.5cm width x 0.2cm depth; 0.196cm^2 area and 0.039cm^3 volume. The wound is limited to skin breakdown. There is no tunneling or undermining noted. There is a small amount of serous drainage noted. The wound margin is distinct with the outline attached to the wound base. There is no granulation within the wound bed. There is a large (67-100%) amount of necrotic tissue within the wound bed including Adherent Slough. The periwound skin appearance exhibited: Moist. The periwound skin appearance did not exhibit: Callus, Crepitus, Excoriation, Fluctuance, Friable, Induration, Localized Edema, Rash, Scarring, Dry/Scaly, Maceration, Atrophie Blanche, Cyanosis, Ecchymosis, Hemosiderin Staining, Mottled, Pallor, Rubor, Erythema. Periwound temperature was noted as  No Abnormality. The periwound has tenderness on palpation. [name], [name] ([id number]) Assessment Active Problems ICD-10 [phone] - Non-pressure chronic ulcer of other part of left foot with fat layer exposed L97.511 - Non-pressure chronic ulcer of other part of right foot limited to breakdown of skin E11.621 - Type 2 diabetes mellitus with foot ulcer E11.51 - Type 2 diabetes mellitus with diabetic peripheral angiopathy without gangrene Procedures Wound #5 Wound #5 is a Diabetic Wound/Ulcer of the Lower Extremity located on the Left Calcaneus . There was a Skin/Subcutaneous Tissue Debridement ([id number]) debridement with total area of 11 sq cm performed by [name], MD. with the following instrument(s): Blade to remove  Non-Viable tissue/material including Fibrin/Slough, Callus, and Subcutaneous after achieving pain control using Lidocaine 4% Topical Solution. A time out was conducted prior to the start of the procedure. A Minimum amount of bleeding was controlled with Pressure. The procedure was tolerated well with a pain level of 0 throughout and a pain level of 0 following the procedure. Post Debridement Measurements: 5cm length x 2.2cm width x 0.1cm depth; 0.864cm^3 volume. Post procedure Diagnosis Wound #5: Same as Pre-Procedure Wound #6 Wound #6 is a Diabetic Wound/Ulcer of the Lower Extremity located on the Left,Plantar Foot . There was a Skin/Subcutaneous Tissue Debridement ([id number]) debridement with total area of 4 sq cm performed by [name], MD. with the following instrument(s): Blade and Forceps to remove Non-Viable tissue/material including Fibrin/Slough, Callus, and Subcutaneous after achieving pain control using Lidocaine 4% Topical Solution. A time out was conducted prior to the start of the procedure. A Minimum amount of bleeding was controlled with Pressure. The procedure was tolerated well with a pain level of 0 throughout and a pain level of 0 following the procedure. Post Debridement Measurements: 2cm length x 2cm width x 0.1cm depth; 0.314cm^3 volume. Post procedure Diagnosis Wound #6: Same as Pre-Procedure Wound #7 Wound #7 is a Diabetic Wound/Ulcer of the Lower Extremity located on the Left Metatarsal head first . There was a Skin/Subcutaneous Tissue Debridement ([id number]) debridement with total area of 1.5 sq cm performed by [name], MD. with the following instrument(s): Blade to remove Non-Viable tissue/material including Fibrin/Slough, Callus, and Subcutaneous after achieving pain control using Lidocaine 4% Topical Solution. A time out was conducted prior to the start of the procedure. A Minimum amount of bleeding was controlled with Pressure. The procedure  was tolerated well with a pain level of 0 [name], [name] ([id number]) throughout and a pain level of 0 following the procedure. Post Debridement Measurements: 1cm length x 1.5cm width x 0.1cm depth; 0.118cm^3 volume. Post procedure Diagnosis Wound #7: Same as Pre-Procedure Plan Wound Cleansing: Wound #5 Left Calcaneus: Clean wound with Normal Saline. Wound #6 Left,Plantar Foot: Clean wound with Normal Saline. Wound #7 Left Metatarsal head first: Clean wound with Normal Saline. Wound #8 Right,Dorsal Toe Great: Clean wound with Normal Saline. Anesthetic: Wound #5 Left Calcaneus: Topical Lidocaine 4% cream applied to wound bed prior to debridement Wound #6 Left,Plantar Foot: Topical Lidocaine 4% cream applied to wound bed prior to debridement Wound #7 Left Metatarsal head first: Topical Lidocaine 4% cream applied to wound bed prior to debridement Wound #8 Right,Dorsal Toe Great: Topical Lidocaine 4% cream applied to wound bed prior to debridement Primary Wound Dressing: Wound #5 Left Calcaneus: Aquacel Ag Wound #6 Left,Plantar Foot: Aquacel Ag Wound #7 Left Metatarsal head first: Aquacel Ag Wound #8 Right,Dorsal Toe Great: Prisma Ag Secondary Dressing: Wound #5 Left Calcaneus: Gauze and Kerlix/Conform Wound #  6 Left,Plantar Foot: Gauze and Kerlix/Conform Wound #7 Left Metatarsal head first: Gauze and Kerlix/Conform Wound #8 Right,Dorsal Toe Great: Gauze and Kerlix/Conform Dressing Change Frequency: Wound #5 Left Calcaneus: Change dressing every other day. [name], [name] ([id number]) Wound #6 Left,Plantar Foot: Change dressing every other day. Wound #7 Left Metatarsal head first: Change dressing every other day. Wound #8 Right,Dorsal Toe Great: Change dressing every other day. Follow-up Appointments: Wound #5 Left Calcaneus: Return Appointment in 1 week. Wound #6 Left,Plantar Foot: Return Appointment in 1 week. Wound #7 Left Metatarsal head first: Return  Appointment in 1 week. Wound #8 Right,Dorsal Toe Great: Return Appointment in 1 week. Off-Loading: Wound #5 Left Calcaneus: Heel suspension boot to: - [employer] Wound #6 Left,Plantar Foot: Heel suspension boot to: - [employer] Wound #7 Left Metatarsal head first: Heel suspension boot to: - [employer] Wound #8 Right,Dorsal Toe Great: Heel suspension boot to: - [place]: Wound #5 Left Calcaneus: [place] for [place] Nurse may visit PRN to address patient s wound care needs. FACE TO FACE ENCOUNTER: MEDICARE and MEDICAID PATIENTS: I certify that this patient is under my care and that I had a face-to-face encounter that meets the physician face-to-face encounter requirements with this patient on this date. The encounter with the patient was in whole or in part for the following MEDICAL CONDITION: (primary reason for [place]) MEDICAL NECESSITY: I certify, that based on my findings, NURSING services are a medically necessary home health service. HOME BOUND STATUS: I certify that my clinical findings support that this patient is homebound (i.e., Due to illness or injury, pt requires aid of supportive devices such as crutches, cane, wheelchairs, walkers, the use of special transportation or the assistance of another person to leave their place of residence. There is a normal inability to leave the home and doing so requires considerable and taxing effort. Other absences are for medical reasons / religious services and are infrequent or of short duration when for other reasons). If current dressing causes regression in wound condition, may D/C ordered dressing product/s and apply Normal Saline Moist Dressing daily until next [place] / Other MD appointment. [place] of regression in wound condition at [phone]. Please direct any NON-WOUND related issues/requests for orders to patient's Primary Care  Physician Wound #6 Left,Plantar Foot: [place] for [place] Nurse may visit PRN to address patient s wound care needs. FACE TO FACE ENCOUNTER: MEDICARE and MEDICAID PATIENTS: I certify that this patient is under my care and that I had a face-to-face encounter that meets the physician face-to-face encounter requirements with this patient on this date. The encounter with the patient was in whole or in part for the following MEDICAL CONDITION: (primary reason for [place]) MEDICAL NECESSITY: I certify, that based on my findings, NURSING services are a medically necessary home health service. HOME BOUND STATUS: I certify that my clinical findings support that this patient is homebound (i.e., Due to KARMAN, VENEY. ([id number]) illness or injury, pt requires aid of supportive devices such as crutches, cane, wheelchairs, walkers, the use of special transportation or the assistance of another person to leave their place of residence. There is a normal inability to leave the home and doing so requires considerable and taxing effort. Other absences are for medical reasons / religious services and are infrequent or of short duration when for other reasons). If current dressing causes regression  in wound condition, may D/C ordered dressing product/s and apply Normal Saline Moist Dressing daily until next [place] / Other MD appointment. [place] of regression in wound condition at [phone]. Please direct any NON-WOUND related issues/requests for orders to patient's Primary Care Physician Wound #7 Left Metatarsal head first: [place] for [place] Nurse may visit PRN to address patient s wound care needs. FACE TO FACE ENCOUNTER: MEDICARE and MEDICAID PATIENTS: I certify that this patient is under my care and that I had a face-to-face encounter that meets the physician face-to-face  encounter requirements with this patient on this date. The encounter with the patient was in whole or in part for the following MEDICAL CONDITION: (primary reason for [place]) MEDICAL NECESSITY: I certify, that based on my findings, NURSING services are a medically necessary home health service. HOME BOUND STATUS: I certify that my clinical findings support that this patient is homebound (i.e., Due to illness or injury, pt requires aid of supportive devices such as crutches, cane, wheelchairs, walkers, the use of special transportation or the assistance of another person to leave their place of residence. There is a normal inability to leave the home and doing so requires considerable and taxing effort. Other absences are for medical reasons / religious services and are infrequent or of short duration when for other reasons). If current dressing causes regression in wound condition, may D/C ordered dressing product/s and apply Normal Saline Moist Dressing daily until next [place] / Other MD appointment. [place] of regression in wound condition at [phone]. Please direct any NON-WOUND related issues/requests for orders to patient's Primary Care Physician Wound #8 Right,Dorsal Toe Great: [place] for [place] Nurse may visit PRN to address patient s wound care needs. FACE TO FACE ENCOUNTER: MEDICARE and MEDICAID PATIENTS: I certify that this patient is under my care and that I had a face-to-face encounter that meets the physician face-to-face encounter requirements with this patient on this date. The encounter with the patient was in whole or in part for the following MEDICAL CONDITION: (primary reason for [place]) MEDICAL NECESSITY: I certify, that based on my findings, NURSING services are a medically necessary home health service. HOME BOUND STATUS: I certify that my clinical findings support that  this patient is homebound (i.e., Due to illness or injury, pt requires aid of supportive devices such as crutches, cane, wheelchairs, walkers, the use of special transportation or the assistance of another person to leave their place of residence. There is a normal inability to leave the home and doing so requires considerable and taxing effort. Other absences are for medical reasons / religious services and are infrequent or of short duration when for other reasons). If current dressing causes regression in wound condition, may D/C ordered dressing product/s and apply Normal Saline Moist Dressing daily until next [place] / Other MD appointment. [place] of regression in wound condition at [phone]. Please direct any NON-WOUND related issues/requests for orders to patient's Primary Care Physician Medications-please add to medication list.: Wound #5 Left Calcaneus: P.O. Antibiotics - Doxy 165m BID x 7days Wound #6 Left,Plantar Foot: P.O. Antibiotics - Doxy 1028mBID x 7days Wound #7 Left Metatarsal head first: P.O. Antibiotics - Doxy 10041mID x 7days Wound #8 Right,Dorsal Toe Great: P.O. Antibiotics - Doxy 100m53mD x 7days [name], [name] ([id number] ordered were: X-ray, foot -  AP lateral. R/O osteomyelitis; non healing wound, foul smell drainage. ordered were: Cultures and Sensitivities - Obtained from left plantar foot 4th met head Services and Therapies ordered were: Arterial Studies- Bilateral General Notes: Cultures done but will not show on report; not populating under the drop box. #1 we applied silver alginate to the wounds on his left foot including the left fourth metatarsal head plantar aspect, lateral aspect of his first met head and the heel. All of these areas required surgical debridement. #2 a culture of the purulent drainage from the left fourth metatarsal head was done. I started him on empiric doxycycline #3 we dressed  the wounds on his left foot with silver alginate, kerlix #4 applied collagen to the small area on the PIP joint dorsally of his right first toe #5 we asked home health to become involved in changing this man's dressings #6 x-ray of the left foot was requested rule out osteomyelitis #7 last him to return to vascular surgery for review of the circulation in the left foot. Electronic Signature(s) Signed: 07/20/2015 5:04:49 PM By: [name] MD Entered By: [name] on 07/20/2015 10:38:52 [name] ([id number]) -------------------------------------------------------------------------------- ROS/PFSH Details Patient Name: Chris Bottoms K. Date of Service: 07/20/2015 8:45 AM Medical Record Patient Account Number: [id number] Number: Treating RN: Baruch Gouty, RN, BSN, Rita Feb 04, 1925 [phone] y.o. Other Clinician: Date of Birth/Sex: Male) Treating Citlaly Camplin, [place] Primary Care Physician: Golden Pop Physician/Extender: G Referring Physician: Golden Pop Weeks in Treatment: 0 Information Obtained From Patient Wound History Do you currently have one or more open woundso Yes How many open wounds do you currently haveo 3 Approximately how long have you had your woundso weeks How have you been treating your wound(s) until nowo open to air Has your wound(s) ever healed and then re-openedo No Have you had any lab work done in the past montho No Have you tested positive for an antibiotic resistant organism (MRSA, VRE)o No Have you tested positive for osteomyelitis (bone infection)o No Have you had any tests for circulation on your legso No Have you had other problems associated with your woundso Infection, Swelling Eyes Complaints and Symptoms: Positive for: Glasses / Contacts Medical History: Negative for: Cataracts; Glaucoma; Optic Neuritis Integumentary (Skin) Complaints and Symptoms: Positive for: Wounds; Breakdown; Swelling Medical History: Positive for: History  of pressure wounds Negative for: History of Burn Ear/Nose/Mouth/Throat Complaints and Symptoms: No Complaints or Symptoms Medical History: Negative for: Chronic sinus problems/congestion; Middle ear problems Hand, Ch[place] K. ([id number]) Hematologic/Lymphatic Complaints and Symptoms: No Complaints or Symptoms Medical History: Negative for: Anemia; Hemophilia; Human Immunodeficiency Virus; Lymphedema; Sickle Cell Disease Respiratory Complaints and Symptoms: No Complaints or Symptoms Medical History: Negative for: Aspiration; Asthma; Chronic Obstructive Pulmonary Disease (COPD); Pneumothorax; Sleep Apnea; Tuberculosis Cardiovascular Complaints and Symptoms: No Complaints or Symptoms Medical History: Positive for: Hypertension Negative for: Angina; Arrhythmia; Congestive Heart Failure; Coronary Artery Disease; Hypotension; Myocardial Infarction; Peripheral Arterial Disease; Peripheral Venous Disease; Phlebitis; Vasculitis Past Medical History Notes: stent Gastrointestinal Complaints and Symptoms: No Complaints or Symptoms Medical History: Negative for: Cirrhosis ; Colitis; Crohnos; Hepatitis A; Hepatitis B; Hepatitis C Endocrine Complaints and Symptoms: No Complaints or Symptoms Medical History: Positive for: Type II Diabetes Past Medical History Notes: hypothyrodism Time with diabetes: 10years Treated with: Oral agents Genitourinary [name], [name] ([id number]) Complaints and Symptoms: No Complaints or Symptoms Medical History: Negative for: End Stage Renal Disease Past Medical History Notes: enlarged prostate Immunological Complaints and Symptoms: No Complaints or Symptoms Medical History: Negative for: Lupus Erythematosus;  Raynaudos; Scleroderma Musculoskeletal Complaints and Symptoms: No Complaints or Symptoms Medical History: Positive for: Osteoarthritis Neurologic Complaints and Symptoms: No Complaints or Symptoms Medical History: Positive for:  Neuropathy Past Medical History Notes: parkinson Oncologic Complaints and Symptoms: No Complaints or Symptoms Medical History: Negative for: Received Chemotherapy; Received Radiation Psychiatric Complaints and Symptoms: No Complaints or Symptoms Medical History: Negative for: Anorexia/bulimia; Confinement Anxiety Family and Social History [name], [name]. ([id number]) Cancer: No; Diabetes: Yes - Siblings; Heart Disease: Yes - Father; Hereditary Spherocytosis: No; Hypertension: No; Kidney Disease: No; Lung Disease: No; Seizures: No; Stroke: Yes - Mother; Thyroid Problems: No; Tuberculosis: No; Never smoker; Marital Status - Widowed; Alcohol Use: Never; Drug Use: No History; Caffeine Use: Daily; Financial Concerns: No; Food, Clothing or Shelter Needs: No; Support System Lacking: No; Transportation Concerns: No; Advanced Directives: No; Patient does not want information on Advanced Directives; Living Will: No Electronic Signature(s) Signed: 07/20/2015 5:04:49 PM By: [name] MD Signed: 07/20/2015 5:09:16 PM By: Regan Lemming BSN, RN Entered By: Regan Lemming on 07/20/2015 08:58:40 [name] ([id number]) -------------------------------------------------------------------------------- SuperBill Details Patient Name: Chris Bottoms K. Date of Service: 07/20/2015 Medical Record Patient Account Number: [id number] Number: Treating RN: Baruch Gouty, RN, BSN, Rita 11/26/1924 [phone] y.o. Other Clinician: Date of Birth/Sex: Male) Treating Annie Saephan, [place] Primary Care Physician: Golden Pop Physician/Extender: G Referring Physician: Golden Pop Weeks in Treatment: 0 Diagnosis Coding ICD-10 Codes Code Description [phone] Non-pressure chronic ulcer of other part of left foot with fat layer exposed L97.511 Non-pressure chronic ulcer of other part of right foot limited to breakdown of skin E11.621 Type 2 diabetes mellitus with foot ulcer E11.51 Type 2 diabetes mellitus with  diabetic peripheral angiopathy without gangrene Facility Procedures CPT4 Code Description: [id number] - WOUND CARE VISIT-LEV 3 EST PT Modifier: Quantity: 1 CPT4 Code Description: [id number] 11042 - DEB SUBQ TISSUE 20 SQ CM/< ICD-10 Description Diagnosis L97.522 Non-pressure chronic ulcer of other part of left foot Modifier: with fat lay Quantity: 1 er exposed Physician Procedures CPT4 Code Description: [id number] 11042 - WC PHYS SUBQ TISS 20 SQ CM ICD-10 Description Diagnosis L97.522 Non-pressure chronic ulcer of other part of left foot Modifier: with fat laye Quantity: 1 r exposed Electronic Signature(s) Signed: 07/20/2015 5:04:49 PM By: [name] MD Entered By: [name] on 07/20/2015 10:39:19

## 2015-07-21 NOTE — Progress Notes (Signed)
Chris, [name] ([id number]) Visit Report for 07/20/2015 Allergy List Details Patient Name: [name], [name]. Date of Service: 07/20/2015 8:45 AM Medical Record Number: [id number] Patient Account Number: [id number] Date of Birth/Sex: [birth year] (82 y.o. Male) Treating RN: Clover Mealy, RN, BSN, [place] Sink Primary Care Physician: Vonita Moss Other Clinician: Referring Physician: Vonita Moss Treating Physician/Extender: Maxwell Caul Weeks in Treatment: 0 Allergies Active Allergies No Known Drug Allergies Allergy Notes Electronic Signature(s) Signed: 07/20/2015 5:09:16 PM By: Elpidio Eric BSN, RN Entered By: Elpidio Eric on 07/20/2015 08:39:35 [name] ([id number]) -------------------------------------------------------------------------------- Arrival Information Details Patient Name: Chris Riding K. Date of Service: 07/20/2015 8:45 AM Medical Record Number: [id number] Patient Account Number: [id number] Date of Birth/Sex: [birth year] (82 y.o. Male) Treating RN: Clover Mealy, RN, BSN, [place] Sink Primary Care Physician: Vonita Moss Other Clinician: Referring Physician: Vonita Moss Treating Physician/Extender: Altamese [place] in Treatment: 0 Visit Information Patient Arrived: Wheel Chair Arrival Time: 08:38 Accompanied By: SELF Transfer Assistance: EasyPivot Patient Lift Patient Identification Verified: Yes Secondary Verification Process Yes Completed: Patient Requires Transmission- No Based Precautions: Patient Has Alerts: No History Since Last Visit Added or deleted any medications: No Any new allergies or adverse reactions: No Had a fall or experienced change in activities of daily living that may affect risk of falls: No Signs or symptoms of abuse/neglect since last visito No Hospitalized since last visit: No Electronic Signature(s) Signed: 07/20/2015 5:09:16 PM By: Elpidio Eric BSN, RN Entered By: Elpidio Eric on 07/20/2015 08:39:28 [name]  ([id number]) -------------------------------------------------------------------------------- Clinic Level of Care Assessment Details Patient Name: Chris Riding K. Date of Service: 07/20/2015 8:45 AM Medical Record Number: [id number] Patient Account Number: [id number] Date of Birth/Sex: 09/02/1924 (82 y.o. Male) Treating RN: Clover Mealy, RN, BSN,  Sink Primary Care Physician: Vonita Moss Other Clinician: Referring Physician: Vonita Moss Treating Physician/Extender: Altamese  in Treatment: 0 Clinic Level of Care Assessment Items TOOL 1 Quantity Score  - Use when EandM and Procedure is performed on INITIAL visit 0 ASSESSMENTS - Nursing Assessment / Reassessment X - General Physical Exam (combine w/ comprehensive assessment (listed just 1 20 below) when performed on new pt. evals) X - Comprehensive Assessment (HX, ROS, Risk Assessments, Wounds Hx, etc.) 1 25 ASSESSMENTS - Wound and Skin Assessment / Reassessment  - Dermatologic / Skin Assessment (not related to wound area) 0 ASSESSMENTS - Ostomy and/or Continence Assessment and Care  - Incontinence Assessment and Management 0  - Ostomy Care Assessment and Management (repouching, etc.) 0 PROCESS - Coordination of Care X - Simple Patient / Family Education for ongoing care 1 15  - Complex (extensive) Patient / Family Education for ongoing care 0 X - Staff obtains Chiropractor, Records, Test Results / Process Orders 1 10 X - Staff telephones HHA, Nursing Homes / Clarify orders / etc 1 10  - Routine Transfer to another Facility (non-emergent condition) 0  - Routine Hospital Admission (non-emergent condition) 0 X - New Admissions / Manufacturing engineer / Ordering NPWT, Apligraf, etc. 1 15  - Emergency Hospital Admission (emergent condition) 0 PROCESS - Special Needs  - Pediatric / Minor Patient Management 0  - Isolation Patient Management 0 [name], [name] ([id number])  - Hearing / Language / Visual  special needs 0  - Assessment of Community assistance (transportation, D/C planning, etc.) 0  - Additional assistance / Altered mentation 0  - Support Surface(s) Assessment (bed, cushion, seat, etc.) 0 INTERVENTIONS - Miscellaneous  - External ear exam 0  - Patient Transfer (multiple staff / Nurse, adult /  Similar devices) 0 []  - Simple Staple / Suture removal (25 or less) 0 []  - Complex Staple / Suture removal (26 or more) 0 []  - Hypo/Hyperglycemic Management (do not check if billed separately) 0 X - Ankle / Brachial Index (ABI) - do not check if billed separately 1 15 Has the patient been seen at the hospital within the last three years: Yes Total Score: 110 Level Of Care: New/Established - Level 3 Electronic Signature(s) Signed: 07/20/2015 5:09:16 PM By: Elpidio Eric BSN, RN Entered By: Elpidio Eric on 07/20/2015 09:33:39 [name] ([id number]) -------------------------------------------------------------------------------- Encounter Discharge Information Details Patient Name: Chris Riding K. Date of Service: 07/20/2015 8:45 AM Medical Record Number: [id number] Patient Account Number: [id number] Date of Birth/Sex: 1924/09/27 (82 y.o. Male) Treating RN: Clover Mealy, RN, BSN, [place] Sink Primary Care Physician: Vonita Moss Other Clinician: Referring Physician: Vonita Moss Treating Physician/Extender: Altamese [place] in Treatment: 0 Encounter Discharge Information Items Schedule Follow-up Appointment: No Medication Reconciliation completed No and provided to Patient/Care Lukas Pelcher: Provided on Clinical Summary of Care: 07/20/2015 Form Type Recipient Paper Patient [facility] Electronic Signature(s) Signed: 07/20/2015 9:57:08 AM By: Gwenlyn Perking Entered By: Gwenlyn Perking on 07/20/2015 09:57:07 [name], [name] ([id number]) -------------------------------------------------------------------------------- Lower Extremity Assessment Details Patient Name: Chris Riding  K. Date of Service: 07/20/2015 8:45 AM Medical Record Number: [id number] Patient Account Number: [id number] Date of Birth/Sex: 1925/05/13 (82 y.o. Male) Treating RN: Clover Mealy, RN, BSN, [place] Sink Primary Care Physician: Vonita Moss Other Clinician: Referring Physician: Vonita Moss Treating Physician/Extender: Altamese [place] in Treatment: 0 Vascular Assessment Pulses: Posterior Tibial Palpable: [Left:No] [Right:No] Doppler: [Left:Monophasic] [Right:Monophasic] Dorsalis Pedis Palpable: [Left:No] [Right:No] Doppler: [Left:Monophasic] [Right:Monophasic] Extremity colors, hair growth, and conditions: Extremity Color: [Left:Normal] [Right:Normal] Hair Growth on Extremity: [Left:Yes] [Right:Yes] Temperature of Extremity: [Left:Warm] [Right:Warm] Capillary Refill: [Left:< 3 seconds] [Right:< 3 seconds] Blood Pressure: Brachial: [Right:145] Dorsalis Pedis: [Left:Dorsalis Pedis: 140] Ankle: Posterior Tibial: [Left:Posterior Tibial:] [Right:0.97] Toe Nail Assessment Left: Right: Thick: Yes Yes Discolored: No No Deformed: No No Improper Length and Hygiene: No No Notes left pulses inaudible. very difficult to hear with a doppler. Electronic Signature(s) Signed: 07/20/2015 5:09:16 PM By: Elpidio Eric BSN, RN Entered By: Elpidio Eric on 07/20/2015 08:56:38 [name] ([id number]) -------------------------------------------------------------------------------- Multi Wound Chart Details Patient Name: Chris Riding K. Date of Service: 07/20/2015 8:45 AM Medical Record Number: [id number] Patient Account Number: [id number] Date of Birth/Sex: 07/02/1924 (82 y.o. 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] [6:the Lower Extremity] [7:the Lower Extremity] Comorbid History: [5:Hypertension, Type II Diabetes, History of pressure wounds, Osteoarthritis, Neuropathy Osteoarthritis, Neuropathy Osteoarthritis, Neuropathy] [6:Hypertension, Type II Diabetes, History of pressure wounds,] [7:Hypertension, Type II  Diabetes, History of pressure wounds,] Date Acquired: [5:07/11/2015] [6:07/11/2015] [7:07/11/2015] Weeks of Treatment: [5:0] [6:0] [7:0] Wound Status: [5:Open] [6:Open] [7:Open] Measurements L x W x D 5x2.2x0.1 [6:2x2x0.1] [7:1x1.5x0.11] (cm) Area (cm) : [5:8.639] [6:3.142] [7:1.178] Volume (cm) : [5:0.864] [6:0.314] [7:0.13] % Reduction in Area: [5:0.00%] [6:0.00%] [7:0.00%] % Reduction in Volume: 0.00% [6:0.00%] [7:0.00%] Classification: [5:Unable to visualize wound Unable to visualize wound Unable  to visualize wound bed] [6:bed] [7:bed] Exudate Amount: [5:None Present] [6:None Present] [7:None Present] Exudate Type: [5:N/A] [6:N/A] [7:N/A] Exudate Color: [5:N/A] [6:N/A] [7:N/A] Wound Margin: [5:Distinct, outline attached Indistinct, nonvisible] [7:Indistinct, nonvisible] Granulation Amount: [5:None Present (0%)] [6:None Present (0%)] [7:None Present (0%)] Necrotic Amount: [5:Large (67-100%)] [6:Large (67-100%)] [7:Large (67-100%)] Necrotic Tissue: [5:Eschar] [6:Eschar] [7:Eschar] Exposed Structures: [5:Fascia: No Fat: No Tendon: No] [6:Fascia: No Fat: No Tendon: No] [7:Fascia: No Fat: No Tendon: No] Muscle: No Muscle: No Muscle: No Joint: No Joint: No Joint: No Bone: No Bone: No Bone: No Limited to  Skin Limited to Skin Limited to Skin Breakdown Breakdown Breakdown Epithelialization: None None None Periwound Skin Texture: Edema: No Callus: Yes Callus: Yes Excoriation: No Edema: No Edema: No Induration: No Excoriation: No Excoriation: No Callus: No Induration: No Induration: No Crepitus: No Crepitus: No Crepitus: No Fluctuance: No Fluctuance: No Fluctuance: No Friable: No Friable: No Friable: No Rash: No Rash: No Rash: No Scarring: No Scarring: No Scarring: No Periwound Skin Dry/Scaly: Yes Dry/Scaly: Yes Dry/Scaly: Yes Moisture: Maceration: No Maceration: No Maceration: No Moist: No Moist: No Moist: No Periwound Skin Color: Atrophie Blanche: No Atrophie Blanche: No Atrophie Blanche: No Cyanosis: No Cyanosis: No Cyanosis: No Ecchymosis: No Ecchymosis: No Ecchymosis: No Erythema: No Erythema: No Erythema: No Hemosiderin Staining: No Hemosiderin Staining: No Hemosiderin Staining: No Mottled: No Mottled: No Mottled: No Pallor: No Pallor: No Pallor: No Rubor: No Rubor: No Rubor: No Temperature: No Abnormality No Abnormality No Abnormality Tenderness on Yes Yes Yes Palpation: Wound Preparation: Ulcer Cleansing: Ulcer Cleansing: Ulcer Cleansing: Rinsed/Irrigated with Rinsed/Irrigated with Rinsed/Irrigated with Saline Saline Saline Topical Anesthetic Topical Anesthetic Topical Anesthetic Applied: Other: lidocaine Applied: Other: lidocaine Applied: Other: lidocaine 4% 4% 4% Wound Number: 8 N/A N/A Photos: No Photos N/A N/A Wound Location: Right Toe Great - Dorsal N/A N/A Wounding Event: Gradually Appeared N/A N/A Primary Etiology: Diabetic Wound/Ulcer of N/A N/A the Lower Extremity Comorbid History: Hypertension, Type II N/A N/A Diabetes, History of pressure wounds, Osteoarthritis, Neuropathy Date Acquired: 07/11/2015 N/A N/A Weeks of Treatment: 0 N/A N/A Wound Status: Open N/A N/A Kamm, [name] ([id number]) Measurements L x W x D  0.5x0.5x0.2 N/A N/A (cm) Area (cm) : 0.196 N/A N/A Volume (cm) : 0.039 N/A N/A % Reduction in Area: 0.00% N/A N/A % Reduction in Volume: 0.00% N/A N/A Classification: Grade 1 N/A N/A Exudate Amount: Small N/A N/A Exudate Type: Serous N/A N/A Exudate Color: amber N/A N/A Wound Margin: Distinct, outline attached N/A N/A Granulation Amount: None Present (0%) N/A N/A Necrotic Amount: Large (67-100%) N/A N/A Necrotic Tissue: Adherent Slough N/A N/A Exposed Structures: Fascia: No N/A N/A Fat: No Tendon: No Muscle: No Joint: No Bone: No Limited to Skin Breakdown Epithelialization: None N/A N/A Periwound Skin Texture: Edema: No N/A N/A Excoriation: No Induration: No Callus: No Crepitus: No Fluctuance: No Friable: No Rash: No Scarring: No Periwound Skin Moist: Yes N/A N/A Moisture: Maceration: No Dry/Scaly: No Periwound Skin Color: Atrophie Blanche: No N/A N/A Cyanosis: No Ecchymosis: No Erythema: No Hemosiderin Staining: No Mottled: No Pallor: No Rubor: No Temperature: No Abnormality N/A N/A Tenderness on Yes N/A N/A Palpation: Wound Preparation: Ulcer Cleansing: N/A N/A Rinsed/Irrigated with Saline Ress, [name] ([id number]) Topical Anesthetic Applied: Other: lidocaine 4% Treatment Notes Electronic Signature(s) Signed: 07/20/2015 5:09:16 PM By: Elpidio Eric BSN, RN Entered By: Elpidio Eric on 07/20/2015 09:25:06 [name] ([id number]) -------------------------------------------------------------------------------- Multi-Disciplinary Care Plan Details Patient Name: Chris Riding K. Date of Service: 07/20/2015 8:45 AM Medical Record  Number: [id number] Patient Account Number: [id number] Date of Birth/Sex: [birth year] (82 y.o. Male) Treating RN: Clover Mealy, RN, BSN, [place] Sink Primary Care Physician: Vonita Moss Other Clinician: Referring Physician: Vonita Moss Treating Physician/Extender: Altamese [place] in Treatment: 0 Active Inactive Abuse  / Safety / Falls / Self Care Management Nursing Diagnoses: Impaired home maintenance Impaired physical mobility Knowledge deficit related to: safety; personal, health (wound), emergency Potential for falls Self care deficit: actual or potential Goals: Patient will remain injury free Date Initiated: 07/20/2015 Goal Status: Active Patient/caregiver will verbalize understanding of skin care regimen Date Initiated: 07/20/2015 Goal Status: Active Patient/caregiver will verbalize/demonstrate measure taken to improve self care Date Initiated: 07/20/2015 Goal Status: Active Patient/caregiver will verbalize/demonstrate measures taken to improve the patient's personal safety Date Initiated: 07/20/2015 Goal Status: Active Patient/caregiver will verbalize/demonstrate measures taken to prevent injury and/or falls Date Initiated: 07/20/2015 Goal Status: Active Patient/caregiver will verbalize/demonstrate understanding of what to do in case of emergency Date Initiated: 07/20/2015 Goal Status: Active Interventions: Assess fall risk on admission and as needed Assess: immobility, friction, shearing, incontinence upon admission and as needed Assess impairment of mobility on admission and as needed per policy Assess self care needs on admission and as needed Patient referred to community resources (specify in notes) [name], [name] ([id number]) Provide education on basic hygiene Provide education on fall prevention Provide education on personal and home safety Provide education on safe transfers Notes: Orientation to the Wound Care Program Nursing Diagnoses: Knowledge deficit related to the wound healing center program Goals: Patient/caregiver will verbalize understanding of the Wound Healing Center Program Date Initiated: 07/20/2015 Goal Status: Active Interventions: Provide education on orientation to the wound center Notes: Wound/Skin Impairment Nursing Diagnoses: Impaired tissue  integrity Knowledge deficit related to ulceration/compromised skin integrity Goals: Patient/caregiver will verbalize understanding of skin care regimen Date Initiated: 07/20/2015 Goal Status: Active Ulcer/skin breakdown will have a volume reduction of 50% by week 8 Date Initiated: 07/20/2015 Goal Status: Active Ulcer/skin breakdown will have a volume reduction of 80% by week 12 Date Initiated: 07/20/2015 Goal Status: Active Ulcer/skin breakdown will heal within 14 weeks Date Initiated: 07/20/2015 Goal Status: Active Interventions: Assess patient/caregiver ability to obtain necessary supplies Assess patient/caregiver ability to perform ulcer/skin care regimen upon admission and as needed Assess ulceration(s) every visit [name], [name] ([id number]) Provide education on ulcer and skin care Treatment Activities: Patient referred to home care : 07/20/2015 Skin care regimen initiated : 07/20/2015 Topical wound management initiated : 07/20/2015 Notes: Electronic Signature(s) Signed: 07/20/2015 5:09:16 PM By: Elpidio Eric BSN, RN Entered By: Elpidio Eric on 07/20/2015 09:24:52 Mcdill, [name] ([id number]) -------------------------------------------------------------------------------- Pain Assessment Details Patient Name: Chris Riding K. Date of Service: 07/20/2015 8:45 AM Medical Record Number: [id number] Patient Account Number: [id number] Date of Birth/Sex: 05-04-1925 (82 y.o. Male) Treating RN: Clover Mealy, RN, BSN, [place] Sink Primary Care Physician: Vonita Moss Other Clinician: Referring Physician: Vonita Moss Treating Physician/Extender: Maxwell Caul Weeks in Treatment: 0 Active Problems Location of Pain Severity and Description of Pain Patient Has Paino No Site Locations Pain Management and Medication Current Pain Management: Electronic Signature(s) Signed: 07/20/2015 5:09:16 PM By: Elpidio Eric BSN, RN Entered By: Elpidio Eric on 07/20/2015 08:47:09 [name], [name]  ([id number]) -------------------------------------------------------------------------------- Wound Assessment Details Patient Name: Chris Riding K. Date of Service: 07/20/2015 8:45 AM Medical Record Number: [id number] Patient Account Number: [id number] Date of Birth/Sex: [birth year] (82 y.o. Male) Treating RN: Clover Mealy, RN, BSN, [place] Sink Primary Care Physician: Vonita Moss Other Clinician: Referring Physician: Vonita Moss Treating Physician/Extender: Maxwell Caul  Weeks in Treatment: 0 Wound Status Wound Number: 5 Primary Diabetic Wound/Ulcer of the Lower Etiology: Extremity Wound Location: Left Calcaneus Wound Open Wounding Event: Blister Status: Date Acquired: 07/11/2015 Comorbid Hypertension, Type II Diabetes, History Weeks Of Treatment: 0 History: of pressure wounds, Osteoarthritis, Clustered Wound: No Neuropathy Photos Photo Uploaded By: Elpidio Eric on 07/20/2015 14:32:44 Wound Measurements Length: (cm) 5 Width: (cm) 2.2 Depth: (cm) 0.1 Area: (cm) 8.639 Volume: (cm) 0.864 % Reduction in Area: 0% % Reduction in Volume: 0% Epithelialization: None Tunneling: No Undermining: No Wound Description Classification: Unable to visualize wound bed Wound Margin: Distinct, outline attached Exudate Amount: None Present Foul Odor After Cleansing: No Wound Bed Granulation Amount: None Present (0%) Exposed Structure Necrotic Amount: Large (67-100%) Fascia Exposed: No Necrotic Quality: Eschar Fat Layer Exposed: No Tendon Exposed: No Muscle Exposed: No Joint Exposed: No Reihl, Caulin K. ([id number]) Bone Exposed: No Limited to Skin Breakdown Periwound Skin Texture Texture Color No Abnormalities Noted: No No Abnormalities Noted: No Callus: No Atrophie Blanche: No Crepitus: No Cyanosis: No Excoriation: No Ecchymosis: No Fluctuance: No Erythema: No Friable: No Hemosiderin Staining: No Induration: No Mottled: No Localized Edema: No Pallor: No Rash:  No Rubor: No Scarring: No Temperature / Pain Moisture Temperature: No Abnormality No Abnormalities Noted: No Tenderness on Palpation: Yes Dry / Scaly: Yes Maceration: No Moist: No Wound Preparation Ulcer Cleansing: Rinsed/Irrigated with Saline Topical Anesthetic Applied: Other: lidocaine 4%, Electronic Signature(s) Signed: 07/20/2015 5:09:16 PM By: Elpidio Eric BSN, RN Entered By: Elpidio Eric on 07/20/2015 09:02:57 [name] ([id number]) -------------------------------------------------------------------------------- Wound Assessment Details Patient Name: Chris Riding K. Date of Service: 07/20/2015 8:45 AM Medical Record Number: [id number] Patient Account Number: [id number] Date of Birth/Sex: 1924-09-07 (82 y.o. Male) Treating RN: Clover Mealy, RN, BSN, [place] Primary Care Physician: Vonita Moss Other Clinician: Referring Physician: Vonita Moss Treating Physician/Extender: Maxwell Caul Weeks in Treatment: 0 Wound Status Wound Number: 6 Primary Diabetic Wound/Ulcer of the Lower Etiology: Extremity Wound Location: Left Foot - Plantar Wound Open Wounding Event: Gradually Appeared Status: Date Acquired: 07/11/2015 Comorbid Hypertension, Type II Diabetes, History Weeks Of Treatment: 0 History: of pressure wounds, Osteoarthritis, Clustered Wound: No Neuropathy Photos Photo Uploaded By: Elpidio Eric on 07/20/2015 14:32:45 Wound Measurements Length: (cm) 2 Width: (cm) 2 Depth: (cm) 0.1 Area: (cm) 3.142 Volume: (cm) 0.314 % Reduction in Area: 0% % Reduction in Volume: 0% Epithelialization: None Tunneling: No Undermining: No Wound Description Classification: Unable to visualize wound bed Wound Margin: Indistinct, nonvisible Exudate Amount: None Present Foul Odor After Cleansing: No Wound Bed Granulation Amount: None Present (0%) Exposed Structure Necrotic Amount: Large (67-100%) Fascia Exposed: No Necrotic Quality: Eschar Fat Layer Exposed: No Tendon  Exposed: No Muscle Exposed: No Joint Exposed: No Quinlivan, Ladanian K. ([id number]) Bone Exposed: No Limited to Skin Breakdown Periwound Skin Texture Texture Color No Abnormalities Noted: No No Abnormalities Noted: No Callus: Yes Atrophie Blanche: No Crepitus: No Cyanosis: No Excoriation: No Ecchymosis: No Fluctuance: No Erythema: No Friable: No Hemosiderin Staining: No Induration: No Mottled: No Localized Edema: No Pallor: No Rash: No Rubor: No Scarring: No Temperature / Pain Moisture Temperature: No Abnormality No Abnormalities Noted: No Tenderness on Palpation: Yes Dry / Scaly: Yes Maceration: No Moist: No Wound Preparation Ulcer Cleansing: Rinsed/Irrigated with Saline Topical Anesthetic Applied: Other: lidocaine 4%, Electronic Signature(s) Signed: 07/20/2015 5:09:16 PM By: Elpidio Eric BSN, RN Entered By: Elpidio Eric on 07/20/2015 09:04:30 [name] ([id number]) -------------------------------------------------------------------------------- Wound Assessment Details Patient Name: Chris Riding K. Date of Service: 07/20/2015 8:45 AM Medical  Record Number: [id number] Patient Account Number: [id number] Date of Birth/Sex: 09-24-1924 (82 y.o. Male) Treating RN: Clover Mealy, RN, BSN, [place] Primary Care Physician: Vonita Moss Other Clinician: Referring Physician: Vonita Moss Treating Physician/Extender: Maxwell Caul Weeks in Treatment: 0 Wound Status Wound Number: 7 Primary Diabetic Wound/Ulcer of the Lower Etiology: Extremity Wound Location: Left Metatarsal head first Wound Open Wounding Event: Gradually Appeared Status: Date Acquired: 07/11/2015 Comorbid Hypertension, Type II Diabetes, History Weeks Of Treatment: 0 History: of pressure wounds, Osteoarthritis, Clustered Wound: No Neuropathy Photos Photo Uploaded By: Elpidio Eric on 07/20/2015 14:34:25 Wound Measurements Length: (cm) 1 Width: (cm) 1.5 Depth: (cm) 0.11 Area: (cm) 1.178 Volume:  (cm) 0.13 % Reduction in Area: 0% % Reduction in Volume: 0% Epithelialization: None Tunneling: No Undermining: No Wound Description Classification: Unable to visualize wound bed Wound Margin: Indistinct, nonvisible Exudate Amount: None Present Wound Bed Granulation Amount: None Present (0%) Exposed Structure Necrotic Amount: Large (67-100%) Fascia Exposed: No Necrotic Quality: Eschar Fat Layer Exposed: No Tendon Exposed: No Muscle Exposed: No Joint Exposed: No Mckain, Zyen K. ([id number]) Bone Exposed: No Limited to Skin Breakdown Periwound Skin Texture Texture Color No Abnormalities Noted: No No Abnormalities Noted: No Callus: Yes Atrophie Blanche: No Crepitus: No Cyanosis: No Excoriation: No Ecchymosis: No Fluctuance: No Erythema: No Friable: No Hemosiderin Staining: No Induration: No Mottled: No Localized Edema: No Pallor: No Rash: No Rubor: No Scarring: No Temperature / Pain Moisture Temperature: No Abnormality No Abnormalities Noted: No Tenderness on Palpation: Yes Dry / Scaly: Yes Maceration: No Moist: No Wound Preparation Ulcer Cleansing: Rinsed/Irrigated with Saline Topical Anesthetic Applied: Other: lidocaine 4%, Electronic Signature(s) Signed: 07/20/2015 5:09:16 PM By: Elpidio Eric BSN, RN Entered By: Elpidio Eric on 07/20/2015 09:06:06 [name] ([id number]) -------------------------------------------------------------------------------- Wound Assessment Details Patient Name: Chris Riding K. Date of Service: 07/20/2015 8:45 AM Medical Record Number: [id number] Patient Account Number: [id number] Date of Birth/Sex: August 14, 1924 (82 y.o. 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  Location: Right Toe Great - Dorsal Wound Open Wounding Event: Gradually Appeared Status: Date Acquired: 07/11/2015 Comorbid Hypertension, Type II Diabetes, History Weeks Of Treatment: 0 History: of pressure wounds, Osteoarthritis, Clustered Wound: No Neuropathy Photos Photo Uploaded By: Elpidio Eric on 07/20/2015 14:34:26 Wound Measurements Length: (cm) 0.5 Width: (cm) 0.5 Depth: (cm) 0.2 Area: (cm) 0.196 Volume: (cm) 0.039 % Reduction in Area: 0% % Reduction in Volume: 0% Epithelialization: None Tunneling: No Undermining: No Wound Description Classification: Grade 1 Wound Margin: Distinct, outline attached Exudate Amount: Small Exudate Type: Serous Exudate Color: amber Foul Odor After Cleansing: No Wound Bed Granulation Amount: None Present (0%) Exposed Structure Necrotic Amount: Large (67-100%) Fascia Exposed: No Necrotic Quality: Adherent Slough Fat Layer Exposed: No Tendon Exposed: No Kroner, Fawzi K. ([id number]) Muscle Exposed: No Joint Exposed: No Bone Exposed: No Limited to Skin Breakdown Periwound Skin Texture Texture Color No Abnormalities Noted: No No Abnormalities Noted: No Callus: No Atrophie Blanche: No Crepitus: No Cyanosis: No Excoriation: No Ecchymosis: No Fluctuance: No Erythema: No Friable: No Hemosiderin Staining: No Induration: No Mottled: No Localized Edema: No Pallor: No Rash: No Rubor: No Scarring: No Temperature / Pain Moisture Temperature: No Abnormality No Abnormalities Noted: No Tenderness on Palpation: Yes Dry / Scaly: No Maceration: No Moist: Yes Wound Preparation Ulcer Cleansing: Rinsed/Irrigated with Saline Topical Anesthetic Applied: Other: lidocaine 4%,  Electronic Signature(s) Signed: 07/20/2015 5:09:16 PM By: Elpidio Eric BSN, RN Entered By: Elpidio Eric on 07/20/2015 09:08:53 [name] ([id number]) -------------------------------------------------------------------------------- Vitals  Details Patient Name: Chris Riding K. Date of Service: 07/20/2015 8:45 AM Medical Record Number: [id number] Patient Account Number: [id number] Date of Birth/Sex: [birth year] (82 y.o. Male) Treating RN: Afful, RN, BSN, Rita Primary Care Physician: Vonita Moss Other Clinician: Referring Physician: Vonita Moss Treating Physician/Extender: Altamese Navarro in Treatment: 0 Vital Signs Time Taken: 08:45 Temperature (F): 97.7 Height (in): 72 Pulse (bpm): 72 Source: Stated Respiratory Rate (breaths/min): 18 Weight (lbs): 195 Blood Pressure (mmHg): 140/62 Source: Stated Reference Range: 80 - 120 mg / dl Body Mass Index (BMI): 26.4 Electronic Signature(s) Signed: 07/20/2015 5:09:16 PM By: Elpidio Eric BSN, RN Entered By: Elpidio Eric on 07/20/2015 08:46:43

## 2015-07-22 ENCOUNTER — Ambulatory Visit
Admission: RE | Admit: 2015-07-22 | Discharge: 2015-07-22 | Disposition: A | Discharge status: Home / Self Care | Payer: PPO | Source: Ambulatory Visit | Attending: Internal Medicine | Admitting: Internal Medicine

## 2015-07-22 ENCOUNTER — Other Ambulatory Visit: Payer: Self-pay | Admitting: Internal Medicine

## 2015-07-22 DIAGNOSIS — M869 Osteomyelitis, unspecified: Secondary | ICD-10-CM

## 2015-07-22 DIAGNOSIS — M7989 Other specified soft tissue disorders: Secondary | ICD-10-CM | POA: Insufficient documentation

## 2015-07-22 DIAGNOSIS — X58XXXA Exposure to other specified factors, initial encounter: Secondary | ICD-10-CM | POA: Diagnosis not present

## 2015-07-22 DIAGNOSIS — S81802A Unspecified open wound, left lower leg, initial encounter: Secondary | ICD-10-CM

## 2015-07-22 DIAGNOSIS — E119 Type 2 diabetes mellitus without complications: Secondary | ICD-10-CM | POA: Insufficient documentation

## 2015-07-22 DIAGNOSIS — S91302A Unspecified open wound, left foot, initial encounter: Secondary | ICD-10-CM | POA: Insufficient documentation

## 2015-07-23 LAB — WOUND CULTURE

## 2015-07-27 ENCOUNTER — Encounter: Payer: PPO | Admitting: Internal Medicine

## 2015-07-27 DIAGNOSIS — E11621 Type 2 diabetes mellitus with foot ulcer: Secondary | ICD-10-CM | POA: Diagnosis not present

## 2015-07-28 NOTE — Progress Notes (Signed)
Chris, [name] ([id number]) Visit Report for 07/27/2015 Arrival Information Details Patient Name: [name], [name]. Date of Service: 07/27/2015 10:00 AM Medical Record Number: [id number] Patient Account Number: [id number] Date of Birth/Sex: Oct 05, 1924 (82 y.o. Male) Treating RN: Clover Mealy, RN, BSN, [place] Sink Primary Care Physician: Vonita Moss Other Clinician: Referring Physician: Vonita Moss Treating Physician/Extender: Altamese [place] in Treatment: 1 Visit Information History Since Last Visit Added or deleted any medications: No Patient Arrived: Wheel Chair Any new allergies or adverse reactions: No Arrival Time: 10:09 Had a fall or experienced change in No activities of daily living that may affect Accompanied By: son risk of falls: Transfer Assistance: None Signs or symptoms of abuse/neglect since last No Patient Identification Verified: Yes visito Secondary Verification Process Yes Hospitalized since last visit: No Completed: Has Dressing in Place as Prescribed: Yes Patient Requires Transmission-Based No Pain Present Now: No Precautions: Patient Has Alerts: No Electronic Signature(s) Signed: 07/27/2015 5:07:22 PM By: Elpidio Eric BSN, RN Entered By: Elpidio Eric on 07/27/2015 10:09:53 [name] ([id number]) -------------------------------------------------------------------------------- Encounter Discharge Information Details Patient Name: Chris Riding K. Date of Service: 07/27/2015 10:00 AM Medical Record Number: [id number] Patient Account Number: [id number] Date of Birth/Sex: December 23, 1924 (82 y.o. Male) Treating RN: Clover Mealy, RN, BSN, [place] Sink Primary Care Physician: Vonita Moss Other Clinician: Referring Physician: Vonita Moss Treating Physician/Extender: Altamese [place] in Treatment: 1 Encounter Discharge Information Items Schedule Follow-up Appointment: No Medication Reconciliation completed No and provided to Patient/Care Lavonna Lampron: Provided  on Clinical Summary of Care: 07/27/2015 Form Type Recipient Paper Patient [facility] Electronic Signature(s) Signed: 07/27/2015 11:05:20 AM By: Gwenlyn Perking Entered By: Gwenlyn Perking on 07/27/2015 11:05:20 Hostetter, Halley K. ([id number]) -------------------------------------------------------------------------------- Lower Extremity Assessment Details Patient Name: Chris Riding K. Date of Service: 07/27/2015 10:00 AM Medical Record Number: [id number] Patient Account Number: [id number] Date of Birth/Sex: 1924-08-10 (82 y.o. Male) Treating RN: Clover Mealy, RN, BSN, Home Sink Primary Care Physician: Vonita Moss Other Clinician: Referring Physician: Vonita Moss Treating Physician/Extender: Altamese  in Treatment: 1 Vascular Assessment Pulses: Posterior Tibial Dorsalis Pedis Palpable: [Left:No] [Right:No] Doppler: [Left:Monophasic] [Right:Monophasic] Extremity colors, hair growth, and conditions: Extremity Color: [Left:Mottled] [Right:Mottled] Hair Growth on Extremity: [Left:No] [Right:No] Temperature of Extremity: [Left:Warm] [Right:Warm] Capillary Refill: [Left:< 3 seconds] [Right:< 3 seconds] Toe Nail Assessment Left: Right: Thick: Yes Yes Discolored: No No Deformed: No No Improper Length and Hygiene: No No Electronic Signature(s) Signed: 07/27/2015 5:07:22 PM By: Elpidio Eric BSN, RN Entered By: Elpidio Eric on 07/27/2015 10:22:12 Riddles, [name] ([id number]) -------------------------------------------------------------------------------- Multi Wound Chart Details Patient Name: Chris Riding K. Date of Service: 07/27/2015 10:00 AM Medical Record Number: [id number] Patient Account Number: [id number] Date of Birth/Sex: 01-16-1925 (82 y.o. 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] [6:the Lower Extremity] [7:the Lower Extremity] Comorbid History: [5:Hypertension, Type II Diabetes, History of pressure wounds, Osteoarthritis, Neuropathy Osteoarthritis, Neuropathy Osteoarthritis, Neuropathy] [6:Hypertension, Type II Diabetes, History of pressure wounds,] [7:Hypertension, Type II  Diabetes, History of pressure wounds,] Date Acquired: [5:07/11/2015] [6:07/11/2015] [7:07/11/2015] Weeks of Treatment: [5:1] [6:1] [7:1] Wound Status: [5:Open] [  6:Open] [7:Open] Measurements L x W x D 1x1.5x0.1 [6:0.5x1x0.1] [7:1.2x1.2x0.1] (cm) Area (cm) : [5:1.178] [6:0.393] [7:1.131] Volume (cm) : [5:0.118] [6:0.039] [7:0.113] % Reduction in Area: [5:86.40%] [6:87.50%] [7:4.00%] % Reduction in Volume: 86.30% [6:87.60%] [7:13.10%] Classification: [5:Unable to visualize wound Unable to visualize wound Unable to visualize wound bed] [6:bed] [7:bed] Exudate Amount: [5:None Present] [6:None Present] [7:None Present] Wound Margin: [5:Distinct, outline attached Indistinct, nonvisible] [7:Indistinct, nonvisible] Granulation Amount: [5:None Present (0%)] [6:None Present (0%)] [7:None Present (0%)] Necrotic Amount: [5:Large (67-100%)] [6:Large (67-100%)] [7:Large (67-100%)] Necrotic Tissue: [5:Eschar] [6:Eschar] [7:Eschar] Exposed Structures: [5:Fascia: No Fat: No Tendon: No Muscle: No Joint: No] [6:Fascia: No Fat: No Tendon: No Muscle: No Joint: No] [7:Fascia: No Fat: No Tendon: No Muscle: No Joint: No] Bone: No Bone: No Bone:  No Limited to Skin Limited to Skin Limited to Skin Breakdown Breakdown Breakdown Epithelialization: None None None Periwound Skin Texture: Edema: No Callus: Yes Callus: Yes Excoriation: No Edema: No Edema: No Induration: No Excoriation: No Excoriation: No Callus: No Induration: No Induration: No Crepitus: No Crepitus: No Crepitus: No Fluctuance: No Fluctuance: No Fluctuance: No Friable: No Friable: No Friable: No Rash: No Rash: No Rash: No Scarring: No Scarring: No Scarring: No Periwound Skin Dry/Scaly: Yes Dry/Scaly: Yes Dry/Scaly: Yes Moisture: Maceration: No Maceration: No Maceration: No Moist: No Moist: No Moist: No Periwound Skin Color: Atrophie Blanche: No Atrophie Blanche: No Atrophie Blanche: No Cyanosis: No Cyanosis: No Cyanosis: No Ecchymosis: No Ecchymosis: No Ecchymosis: No Erythema: No Erythema: No Erythema: No Hemosiderin Staining: No Hemosiderin Staining: No Hemosiderin Staining: No Mottled: No Mottled: No Mottled: No Pallor: No Pallor: No Pallor: No Rubor: No Rubor: No Rubor: No Temperature: No Abnormality No Abnormality No Abnormality Tenderness on Yes Yes Yes Palpation: Wound Preparation: Ulcer Cleansing: Ulcer Cleansing: Ulcer Cleansing: Rinsed/Irrigated with Rinsed/Irrigated with Rinsed/Irrigated with Saline Saline Saline Topical Anesthetic Topical Anesthetic Topical Anesthetic Applied: Other: lidocaine Applied: Other: lidocaine Applied: Other: lidocaine 4% 4% 4% Wound Number: 8 N/A N/A Photos: No Photos N/A N/A Wound Location: Right Toe Great - Dorsal N/A N/A Wounding Event: Gradually Appeared N/A N/A Primary Etiology: Diabetic Wound/Ulcer of N/A N/A the Lower Extremity Comorbid History: Hypertension, Type II N/A N/A Diabetes, History of pressure wounds, Osteoarthritis, Neuropathy Date Acquired: 07/11/2015 N/A N/A Weeks of Treatment: 1 N/A N/A Wound Status: Healed - Epithelialized N/A N/A Measurements L x W x D  0x0x0 N/A N/A (cm) Dery, Nikolus K. ([id number]) Area (cm) : 0 N/A N/A Volume (cm) : 0 N/A N/A % Reduction in Area: 100.00% N/A N/A % Reduction in Volume: 100.00% N/A N/A Classification: Grade 1 N/A N/A Exudate Amount: None Present N/A N/A Wound Margin: Distinct, outline attached N/A N/A Granulation Amount: None Present (0%) N/A N/A Necrotic Amount: None Present (0%) N/A N/A Necrotic Tissue: N/A N/A N/A Exposed Structures: Fascia: No N/A N/A Fat: No Tendon: No Muscle: No Joint: No Bone: No Limited to Skin Breakdown Epithelialization: Large (67-100%) N/A N/A Periwound Skin Texture: Edema: No N/A N/A Excoriation: No Induration: No Callus: No Crepitus: No Fluctuance: No Friable: No Rash: No Scarring: No Periwound Skin Maceration: No N/A N/A Moisture: Moist: No Dry/Scaly: No Periwound Skin Color: Atrophie Blanche: No N/A N/A Cyanosis: No Ecchymosis: No Erythema: No Hemosiderin Staining: No Mottled: No Pallor: No Rubor: No Temperature: No Abnormality N/A N/A Tenderness on No N/A N/A Palpation: Wound Preparation: Ulcer Cleansing: N/A N/A Rinsed/Irrigated with Saline Topical Anesthetic Applied: None Treatment Notes LARAMIE, MEISSNER ([id number]) Electronic Signature(s) Signed: 07/27/2015 5:07:22 PM By: Elpidio Eric BSN,  RN Entered By: Elpidio Eric on 07/27/2015 10:45:47 [name] ([id number]) -------------------------------------------------------------------------------- Multi-Disciplinary Care Plan Details Patient Name: ZAVEN, KLEMENS. Date of Service: 07/27/2015 10:00 AM Medical Record Number: [id number] Patient Account Number: [id number] Date of Birth/Sex: 1924-10-21 (82 y.o. Male) Treating RN: Clover Mealy, RN, BSN, [place] Sink Primary Care Physician: Vonita Moss Other Clinician: Referring Physician: Vonita Moss Treating Physician/Extender: Altamese [place] in Treatment: 1 Active Inactive Abuse / Safety / Falls / Self Care Management Nursing  Diagnoses: Impaired home maintenance Impaired physical mobility Knowledge deficit related to: safety; personal, health (wound), emergency Potential for falls Self care deficit: actual or potential Goals: Patient will remain injury free Date Initiated: 07/20/2015 Goal Status: Active Patient/caregiver will verbalize understanding of skin care regimen Date Initiated: 07/20/2015 Goal Status: Active Patient/caregiver will verbalize/demonstrate measure taken to improve self care Date Initiated: 07/20/2015 Goal Status: Active Patient/caregiver will verbalize/demonstrate measures taken to improve the patient's personal safety Date Initiated: 07/20/2015 Goal Status: Active Patient/caregiver will verbalize/demonstrate measures taken to prevent injury and/or falls Date Initiated: 07/20/2015 Goal Status: Active Patient/caregiver will verbalize/demonstrate understanding of what to do in case of emergency Date Initiated: 07/20/2015 Goal Status: Active Interventions: Assess fall risk on admission and as needed Assess: immobility, friction, shearing, incontinence upon admission and as needed Assess impairment of mobility on admission and as needed per policy Assess self care needs on admission and as needed Patient referred to community resources (specify in notes) RENDER, MARLEY ([id number]) Provide education on basic hygiene Provide education on fall prevention Provide education on personal and home safety Provide education on safe transfers Notes: Orientation to the Wound Care Program Nursing Diagnoses: Knowledge deficit related to the wound healing center program Goals: Patient/caregiver will verbalize understanding of the Wound Healing Center Program Date Initiated: 07/20/2015 Goal Status: Active Interventions: Provide education on orientation to the wound center Notes: Wound/Skin Impairment Nursing Diagnoses: Impaired tissue integrity Knowledge deficit related to  ulceration/compromised skin integrity Goals: Patient/caregiver will verbalize understanding of skin care regimen Date Initiated: 07/20/2015 Goal Status: Active Ulcer/skin breakdown will have a volume reduction of 50% by week 8 Date Initiated: 07/20/2015 Goal Status: Active Ulcer/skin breakdown will have a volume reduction of 80% by week 12 Date Initiated: 07/20/2015 Goal Status: Active Ulcer/skin breakdown will heal within 14 weeks Date Initiated: 07/20/2015 Goal Status: Active Interventions: Assess patient/caregiver ability to obtain necessary supplies Assess patient/caregiver ability to perform ulcer/skin care regimen upon admission and as needed Assess ulceration(s) every visit CREWS, MCCOLLAM ([id number]) Provide education on ulcer and skin care Treatment Activities: Patient referred to home care : 07/27/2015 Skin care regimen initiated : 07/27/2015 Topical wound management initiated : 07/27/2015 Notes: Electronic Signature(s) Signed: 07/27/2015 5:07:22 PM By: Elpidio Eric BSN, RN Entered By: Elpidio Eric on 07/27/2015 10:45:23 Kopinski, [name] ([id number]) -------------------------------------------------------------------------------- Pain Assessment Details Patient Name: Chris Riding K. Date of Service: 07/27/2015 10:00 AM Medical Record Number: [id number] Patient Account Number: [id number] Date of Birth/Sex: September 13, 1924 (82 y.o. Male) Treating RN: Clover Mealy, RN, BSN, [place] Sink Primary Care Physician: Vonita Moss Other Clinician: Referring Physician: Vonita Moss Treating Physician/Extender: Maxwell Caul Weeks in Treatment: 1 Active Problems Location of Pain Severity and Description of Pain Patient Has Paino No Site Locations Pain Management and Medication Current Pain Management: Electronic Signature(s) Signed: 07/27/2015 5:07:22 PM By: Elpidio Eric BSN, RN Entered By: Elpidio Eric on 07/27/2015 10:10:00 [name]  ([id number]) -------------------------------------------------------------------------------- Wound Assessment Details Patient Name: Chris Riding K. Date of Service: 07/27/2015 10:00 AM Medical Record Number: [id number] Patient Account Number:  [id number] Date of Birth/Sex: April 27, 1925 (82 y.o. Male) Treating RN: Afful, RN, BSN, Rita Primary Care Physician: Vonita Moss Other Clinician: Referring Physician: Vonita Moss Treating Physician/Extender: Maxwell Caul Weeks in Treatment: 1 Wound Status Wound Number: 5 Primary Diabetic Wound/Ulcer of the Lower Etiology: Extremity Wound Location: Left Calcaneus Wound Open Wounding Event: Blister Status: Date Acquired: 07/11/2015 Comorbid Hypertension, Type II Diabetes, History Weeks Of Treatment: 1 History: of pressure wounds, Osteoarthritis, Clustered Wound: No Neuropathy Photos Photo Uploaded By: Elpidio Eric on 07/27/2015 17:04:03 Wound Measurements Length: (cm) 1 Width: (cm) 1.5 Depth: (cm) 0.1 Area: (cm) 1.178 Volume: (cm) 0.118 % Reduction in Area: 86.4% % Reduction in Volume: 86.3% Epithelialization: None Tunneling: No Undermining: No Wound Description Classification: Unable to visualize wound bed Wound Margin: Distinct, outline attached Exudate Amount: None Present Foul Odor After Cleansing: No Wound Bed Granulation Amount: None Present (0%) Exposed Structure Necrotic Amount: Large (67-100%) Fascia Exposed: No Necrotic Quality: Eschar Fat Layer Exposed: No Tendon Exposed: No Muscle Exposed: No Joint Exposed: No Maruyama, Donyea K. ([id number]) Bone Exposed: No Limited to Skin Breakdown Periwound Skin Texture Texture Color No Abnormalities Noted: No No Abnormalities Noted: No Callus: No Atrophie Blanche: No Crepitus: No Cyanosis: No Excoriation: No Ecchymosis: No Fluctuance: No Erythema: No Friable: No Hemosiderin Staining: No Induration: No Mottled: No Localized Edema: No Pallor: No Rash:  No Rubor: No Scarring: No Temperature / Pain Moisture Temperature: No Abnormality No Abnormalities Noted: No Tenderness on Palpation: Yes Dry / Scaly: Yes Maceration: No Moist: No Wound Preparation Ulcer Cleansing: Rinsed/Irrigated with Saline Topical Anesthetic Applied: Other: lidocaine 4%, Electronic Signature(s) Signed: 07/27/2015 5:07:22 PM By: Elpidio Eric BSN, RN Entered By: Elpidio Eric on 07/27/2015 10:40:18 [name] ([id number]) -------------------------------------------------------------------------------- Wound Assessment Details Patient Name: Chris Riding K. Date of Service: 07/27/2015 10:00 AM Medical Record Number: [id number] Patient Account Number: [id number] Date of Birth/Sex: 08/21/1924 (82 y.o. Male) Treating RN: Clover Mealy, RN, BSN, [place] Primary Care Physician: Vonita Moss Other Clinician: Referring Physician: Vonita Moss Treating Physician/Extender: Maxwell Caul Weeks in Treatment: 1 Wound Status Wound Number: 6 Primary Diabetic Wound/Ulcer of the Lower Etiology: Extremity Wound Location: Left Foot - Plantar Wound Open Wounding Event: Gradually Appeared Status: Date Acquired: 07/11/2015 Comorbid Hypertension, Type II Diabetes, History Weeks Of Treatment: 1 History: of pressure wounds, Osteoarthritis, Clustered Wound: No Neuropathy Photos Photo Uploaded By: Elpidio Eric on 07/27/2015 17:05:20 Wound Measurements Length: (cm) 0.5 Width: (cm) 1 Depth: (cm) 0.1 Area: (cm) 0.393 Volume: (cm) 0.039 % Reduction in Area: 87.5% % Reduction in Volume: 87.6% Epithelialization: None Tunneling: No Undermining: No Wound Description Classification: Unable to visualize wound bed Wound Margin: Indistinct, nonvisible Exudate Amount: None Present Foul Odor After Cleansing: No Wound Bed Granulation Amount: None Present (0%) Exposed Structure Necrotic Amount: Large (67-100%) Fascia Exposed: No Necrotic Quality: Eschar Fat Layer Exposed:  No Tendon Exposed: No Muscle Exposed: No Joint Exposed: No Kuster, Ewald K. ([id number]) Bone Exposed: No Limited to Skin Breakdown Periwound Skin Texture Texture Color No Abnormalities Noted: No No Abnormalities Noted: No Callus: Yes Atrophie Blanche: No Crepitus: No Cyanosis: No Excoriation: No Ecchymosis: No Fluctuance: No Erythema: No Friable: No Hemosiderin Staining: No Induration: No Mottled: No Localized Edema: No Pallor: No Rash: No Rubor: No Scarring: No Temperature / Pain Moisture Temperature: No Abnormality No Abnormalities Noted: No Tenderness on Palpation: Yes Dry / Scaly: Yes Maceration: No Moist: No Wound Preparation Ulcer Cleansing: Rinsed/Irrigated with Saline Topical Anesthetic Applied: Other: lidocaine 4%, Electronic Signature(s) Signed: 07/27/2015 5:07:22 PM By: Clover Mealy,  [place] Sink BSN, RN Entered By: Elpidio Eric on 07/27/2015 10:40:45 [name] ([id number]) -------------------------------------------------------------------------------- Wound Assessment Details Patient Name: KEARY, WATERSON. Date of Service: 07/27/2015 10:00 AM Medical Record Number: [id number] Patient Account Number: [id number] Date of Birth/Sex: 1924-09-27 (82 y.o. Male) Treating RN: Clover Mealy, RN, BSN, [place] Primary Care Physician: Vonita Moss Other Clinician: Referring Physician: Vonita Moss Treating Physician/Extender: Maxwell Caul Weeks in Treatment: 1 Wound Status Wound Number: 7 Primary Diabetic Wound/Ulcer of the Lower Etiology: Extremity Wound Location: Left Metatarsal head first Wound Open Wounding Event: Gradually Appeared Status: Date Acquired: 07/11/2015 Comorbid Hypertension, Type II Diabetes, History Weeks Of Treatment: 1 History: of pressure wounds, Osteoarthritis, Clustered Wound: No Neuropathy Photos Photo Uploaded By: Elpidio Eric on 07/27/2015 17:05:21 Wound Measurements Length: (cm) 1.2 Width: (cm) 1.2 Depth: (cm) 0.1 Area: (cm)  1.131 Volume: (cm) 0.113 % Reduction in Area: 4% % Reduction in Volume: 13.1% Epithelialization: None Tunneling: No Undermining: No Wound Description Classification: Unable to visualize wound bed Wound Margin: Indistinct, nonvisible Exudate Amount: None Present Zagal, Ananias K. ([id number]) Wound Bed Granulation Amount: None Present (0%) Exposed Structure Necrotic Amount: Large (67-100%) Fascia Exposed: No Necrotic Quality: Eschar Fat Layer Exposed: No Tendon Exposed: No Muscle Exposed: No Joint Exposed: No Bone Exposed: No Limited to Skin Breakdown Periwound Skin Texture Texture Color No Abnormalities Noted: No No Abnormalities Noted: No Callus: Yes Atrophie Blanche: No Crepitus: No Cyanosis: No Excoriation: No Ecchymosis: No Fluctuance: No Erythema: No Friable: No Hemosiderin Staining: No Induration: No Mottled: No Localized Edema: No Pallor: No Rash: No Rubor: No Scarring: No Temperature / Pain Moisture Temperature: No Abnormality No Abnormalities Noted: No Tenderness on Palpation: Yes Dry / Scaly: Yes Maceration: No Moist: No Wound Preparation Ulcer Cleansing: Rinsed/Irrigated with Saline Topical Anesthetic Applied: Other: lidocaine 4%, Electronic Signature(s) Signed: 07/27/2015 5:07:22 PM By: Elpidio Eric BSN, RN Entered By: Elpidio Eric on 07/27/2015 10:44:25 [name] ([id number]) -------------------------------------------------------------------------------- Wound Assessment Details Patient Name: Chris Riding K. Date of Service: 07/27/2015 10:00 AM Medical Record Number: [id number] Patient Account Number: [id number] Date of Birth/Sex: Mar 26, 1925 (82 y.o. Male) Treating RN: Clover Mealy, RN, BSN, Rita Primary Care Physician: Vonita Moss Other Clinician: Referring Physician: Vonita Moss Treating Physician/Extender: Maxwell Caul Weeks in Treatment: 1 Wound Status Wound Number: 8 Primary Diabetic Wound/Ulcer of the Lower Etiology:  Extremity Wound Location: Right Toe Great - Dorsal Wound Healed - Epithelialized Wounding Event: Gradually Appeared Status: Date Acquired: 07/11/2015 Comorbid Hypertension, Type II Diabetes, History Weeks Of Treatment: 1 History: of pressure wounds, Osteoarthritis, Clustered Wound: No Neuropathy Photos Photo Uploaded By: Elpidio Eric on 07/27/2015 17:06:23 Wound Measurements Length: (cm) 0 % Reducti Width: (cm) 0 % Reducti Depth: (cm) 0 Epithelia Area: (cm) 0 Tunnelin Volume: (cm) 0 Undermin on in Area: 100% on in Volume: 100% lization: Large (67-100%) g: No ing: No Wound Description Classification: Grade 1 Wound Margin: Distinct, outline attached Exudate Amount: None Present Foul Odor After Cleansing: No Wound Bed Granulation Amount: None Present (0%) Exposed Structure Necrotic Amount: None Present (0%) Fascia Exposed: No Fat Layer Exposed: No Tendon Exposed: No Muscle Exposed: No Joint Exposed: No Highbaugh, Daquarius K. ([id number]) Bone Exposed: No Limited to Skin Breakdown Periwound Skin Texture Texture Color No Abnormalities Noted: No No Abnormalities Noted: No Callus: No Atrophie Blanche: No Crepitus: No Cyanosis: No Excoriation: No Ecchymosis: No Fluctuance: No Erythema: No Friable: No Hemosiderin Staining: No Induration: No Mottled: No Localized Edema: No Pallor: No Rash: No Rubor: No Scarring: No Temperature / Pain Moisture Temperature: No  Abnormality No Abnormalities Noted: No Dry / Scaly: No Maceration: No Moist: No Wound Preparation Ulcer Cleansing: Rinsed/Irrigated with Saline Topical Anesthetic Applied: None Electronic Signature(s) Signed: 07/27/2015 5:07:22 PM By: Elpidio Eric BSN, RN Entered By: Elpidio Eric on 07/27/2015 10:44:55 Antos, [name] ([id number]) -------------------------------------------------------------------------------- Vitals Details Patient Name: Chris Riding K. Date of Service: 07/27/2015 10:00 AM Medical  Record Number: [id number] Patient Account Number: [id number] Date of Birth/Sex: 30-Dec-1924 (82 y.o. Male) Treating RN: Clover Mealy, RN, BSN, Rita Primary Care Physician: Vonita Moss Other Clinician: Referring Physician: Vonita Moss Treating Physician/Extender: Altamese [place] in Treatment: 1 Vital Signs Time Taken: 10:12 Temperature (F): 97.8 Height (in): 72 Pulse (bpm): 75 Weight (lbs): 195 Respiratory Rate (breaths/min): 19 Body Mass Index (BMI): 26.4 Blood Pressure (mmHg): 152/64 Reference Range: 80 - 120 mg / dl Electronic Signature(s) Signed: 07/27/2015 5:07:22 PM By: Elpidio Eric BSN, RN Entered By: Elpidio Eric on 07/27/2015 10:12:36

## 2015-07-28 NOTE — Progress Notes (Signed)
EDIBERTO, SENS ([id number]) Visit Report for 07/27/2015 Chief Complaint Document Details Patient Name: [name], [name]. Date of Service: 07/27/2015 10:00 AM Medical Record Patient Account Number: [id number] Number: Treating RN: Baruch Gouty, RN, BSN, Rita [birth year] [phone] y.o. Other Clinician: Date of Birth/Sex: Male) Treating Levaeh Vice Primary Care Physician: Golden Pop Physician/Extender: G Referring Physician: Golden Pop Weeks in Treatment: 1 Information Obtained from: Patient Chief Complaint Has had swelling of both lower extremities with some ulceration on the right lower extremity. 07/20/15 patient returns today predominantly for a painful wound on the plantar aspect of his left foot of recent onset. Electronic Signature(s) Signed: 07/27/2015 5:19:28 PM By: [name] MD Entered By: [name] on 07/27/2015 13:07:18 [name], [name] ([id number]) -------------------------------------------------------------------------------- Debridement Details Patient Name: Chris Bottoms K. Date of Service: 07/27/2015 10:00 AM Medical Record Patient Account Number: [id number] Number: Treating RN: Baruch Gouty, RN, BSN, Rita Jan 16, 1925 [phone] y.o. Other Clinician: Date of Birth/Sex: Male) Treating Azadeh Hyder, [place] Primary Care Physician: Jeananne Rama, [place]: G Referring Physician: Golden Pop Weeks in Treatment: 1 Debridement Performed for Wound #5 Left Calcaneus Assessment: Performed By: Physician [name], MD Debridement: Debridement Pre-procedure Yes Verification/Time Out Taken: Start Time: 10:40 Pain Control: Lidocaine 4% Topical Solution Level: Skin/Subcutaneous Tissue Total Area Debrided (L x 1 (cm) x 1.5 (cm) = 1.5 (cm) W): Tissue and other Non-Viable, Fibrin/Slough, Subcutaneous material debrided: Instrument: Curette Bleeding: Minimum Hemostasis Achieved: Pressure End Time: 10:45 Procedural Pain: 0 Post Procedural  Pain: 0 Response to Treatment: Procedure was tolerated well Post Debridement Measurements of Total Wound Length: (cm) 1 Width: (cm) 1.5 Depth: (cm) 0.2 Volume: (cm) 0.236 Post Procedure Diagnosis Same as Pre-procedure Electronic Signature(s) Signed: 07/27/2015 5:07:22 PM By: Regan Lemming BSN, RN Signed: 07/27/2015 5:19:28 PM By: [name] MD Entered By: [name] on 07/27/2015 13:06:42 [name], [name] ([id number]) -------------------------------------------------------------------------------- Debridement Details Patient Name: Chris Bottoms K. Date of Service: 07/27/2015 10:00 AM Medical Record Patient Account Number: [id number] Number: Treating RN: Baruch Gouty, RN, BSN, Rita Aug 18, 1924 [phone] y.o. Other Clinician: Date of Birth/Sex: Male) Treating Dandra Shambaugh, [place] Primary Care Physician: Jeananne Rama, [place]: G Referring Physician: Golden Pop Weeks in Treatment: 1 Debridement Performed for Wound #6 Left,Plantar Foot Assessment: Performed By: Physician [name], MD Debridement: Debridement Pre-procedure Yes Verification/Time Out Taken: Start Time: 10:48 Pain Control: Lidocaine 4% Topical Solution Level: Skin/Subcutaneous Tissue Total Area Debrided (L x 0.5 (cm) x 1 (cm) = 0.5 (cm) W): Tissue and other Non-Viable, Exudate, Fibrin/Slough, Subcutaneous material debrided: Instrument: Curette Bleeding: Minimum Hemostasis Achieved: Pressure End Time: 10:52 Procedural Pain: 0 Post Procedural Pain: 0 Response to Treatment: Procedure was tolerated well Post Debridement Measurements of Total Wound Length: (cm) 0.5 Width: (cm) 2 Depth: (cm) 0.1 Volume: (cm) 0.079 Post Procedure Diagnosis Same as Pre-procedure Electronic Signature(s) Signed: 07/27/2015 5:07:22 PM By: Regan Lemming BSN, RN Signed: 07/27/2015 5:19:28 PM By: [name] MD Entered By: [name] on 07/27/2015 13:06:56 [name], [name]  ([id number]) -------------------------------------------------------------------------------- Debridement Details Patient Name: Chris Bottoms K. Date of Service: 07/27/2015 10:00 AM Medical Record Patient Account Number: [id number] Number: Treating RN: Baruch Gouty, RN, BSN, Rita [birth year] [phone] y.o. Other Clinician: Date of Birth/Sex: Male) Treating Larren Copes Primary Care Physician: Golden Pop Physician/Extender: G Referring Physician: Golden Pop Weeks in Treatment: 1 Debridement Performed for Wound #7 Left Metatarsal head first Assessment: Performed By: Physician [name], MD Debridement: Debridement Pre-procedure Yes Verification/Time Out Taken: Start Time: 10:45 Pain Control: Lidocaine 4% Topical Solution Level: Skin/Subcutaneous Tissue Total Area  Debrided (L x 1.2 (cm) x 1.2 (cm) = 1.44 (cm) W): Tissue and other Non-Viable, Fibrin/Slough, Subcutaneous material debrided: Instrument: Curette Bleeding: Minimum Hemostasis Achieved: Pressure End Time: 10:48 Procedural Pain: 0 Post Procedural Pain: 0 Response to Treatment: Procedure was tolerated well Post Debridement Measurements of Total Wound Length: (cm) 1.2 Width: (cm) 1.2 Depth: (cm) 0.1 Volume: (cm) 0.113 Post Procedure Diagnosis Same as Pre-procedure Electronic Signature(s) Signed: 07/27/2015 5:07:22 PM By: Regan Lemming BSN, RN Signed: 07/27/2015 5:19:28 PM By: [name] MD Entered By: [name] on 07/27/2015 13:07:06 [name], [name] ([id number]) -------------------------------------------------------------------------------- HPI Details Patient Name: Chris Bottoms K. Date of Service: 07/27/2015 10:00 AM Medical Record Patient Account Number: [id number] Number: Treating RN: Baruch Gouty, RN, BSN, Rita Aug 02, 1924 [phone] y.o. Other Clinician: Date of Birth/Sex: Male) Treating Garron Eline Primary Care Physician: Golden Pop Physician/Extender: G Referring  Physician: Golden Pop Weeks in Treatment: 1 History of Present Illness Location: had swelling of the right lower extremity with some ulceration on the medial part of his calf and on the right fifth toe Quality: Patient reports No Pain. Severity: Patient states wound (s) are getting better. Duration: Patient has had the wound for < 2 weeks prior to presenting for treatment Context: The wound appeared gradually over time. nail on his right fifth toe came off loose and he pried it Modifying Factors: Other treatment(s) tried include:vascular workup where he is going to be seeing Dr. Lucky Cowboy on Monday Associated Signs and Symptoms: Patient reports presence of swelling HPI Description: Very pleasant 82 year old with a past medical history significant for type 2 diabetes. He underwent left knee surgery in February 2015. He had an angioplasty of his R posterior tibial on 10/30/13. Vascular workup done in April of this year showed an ABI on the right to be noncompressible and the left to be 0.86. About a week ago he noticed some swelling on the right lower extremity and then an ulceration on the medial part of his calf. He also noted that the right fifth nail bed had a loose nail and he pried it open and now has an open wound. He has no evidence of cellulitis. he was seen by the nurse practitioner at his PCPs office and put on Bactroban ointment and doxycycline for 10 days. 01/21/2015 -- he was seen by Dr. Ronalee Belts and though we do not have the note, we know he had used an Unna's boot and asked him to come to see as again for a compression bandage. The patient has no fresh complaints. 07/20/15; the patient returns today predominantly for her wounds on his left plantar foot. He was seen here in the summer last on 8/19 for wounds on his right medial calf and right fifth nail bed. I don't know that he actually returned to be discharged, he is wearing compression on bilateral legs. He has known  peripheral arterial disease in the setting of type 2 diabetes having an angioplasty on the right posterior tibial artery on 10/30/13. His ABI calculated in the clinic today was 0.97 on the right. I don't think the left could be done. The patient tells me that roughly a week ago he started to develop pain in the plantar aspect of his left foot. I think his caregiver noticed the wounds and he is here for our review events. He is minimally ambulatory, spends most of his time in a wheelchair. I am not certain of the issue here. 07/27/15; the patient is here for 3 wounds. The first over his left  first met head, second over the left medial heel and the fourth over the left fourth metatarsal head. Culture from the fourth metatarsal head last week showed Klebsiella. This would not be sensitive to the doxycycline possibly I have therefore changed him to Keflex to which the Klebsiella should be sensitive Electronic Signature(s) ELRIC, TIRADO ([id number]) Signed: 07/27/2015 5:19:28 PM By: [name] MD Entered By: [name] on 07/27/2015 13:13:31 [name], [name] ([id number]) -------------------------------------------------------------------------------- Physical Exam Details Patient Name: Chris Bottoms K. Date of Service: 07/27/2015 10:00 AM Medical Record Patient Account Number: [id number] Number: Treating RN: Baruch Gouty, RN, BSN, Rita 10-23-1924 [phone] y.o. Other Clinician: Date of Birth/Sex: Male) Treating Matteo Banke Primary Care Physician: Golden Pop Physician/Extender: G Referring Physician: Golden Pop Weeks in Treatment: 1 Notes Wound exam; the area over the heel and the f in the lateral aspect of his first metatarsal head require aggressive debridement. The area on the right dorsal PIP is healed. Electronic Signature(s) Signed: 07/27/2015 5:19:28 PM By: [name] MD Entered By: [name] on 07/27/2015 13:10:52 [name]  ([id number]) -------------------------------------------------------------------------------- Physician Orders Details Patient Name: [name]. Date of Service: 07/27/2015 10:00 AM Medical Record Patient Account Number: [id number] Number: Treating RN: Baruch Gouty, RN, BSN, Rita 11-10-1924 [phone] y.o. Other Clinician: Date of Birth/Sex: Male) Treating Leasa Kincannon Primary Care Physician: Golden Pop Physician/Extender: G Referring Physician: Golden Pop Weeks in Treatment: 1 Verbal / Phone Orders: Yes Clinician: Afful, RN, BSN, Rita Read Back and Verified: Yes Diagnosis Coding Wound Cleansing Wound #5 Left Calcaneus o Clean wound with Normal Saline. Wound #6 Left,Plantar Foot o Clean wound with Normal Saline. Wound #7 Left Metatarsal head first o Clean wound with Normal Saline. Anesthetic Wound #5 Left Calcaneus o Topical Lidocaine 4% cream applied to wound bed prior to debridement Wound #6 Left,Plantar Foot o Topical Lidocaine 4% cream applied to wound bed prior to debridement Wound #7 Left Metatarsal head first o Topical Lidocaine 4% cream applied to wound bed prior to debridement Primary Wound Dressing Wound #5 Left Calcaneus o Aquacel Ag Wound #6 Left,Plantar Foot o Aquacel Ag Wound #7 Left Metatarsal head first o Aquacel Ag Secondary Dressing Wound #5 Left Calcaneus o Gauze and Kerlix/Conform Wound #6 Left,Plantar Foot Parr, Jaze K. ([id number]) o Gauze and Kerlix/Conform Wound #7 Left Metatarsal head first o Gauze and Kerlix/Conform Dressing Change Frequency Wound #5 Left Calcaneus o Change dressing every other day. Wound #6 Left,Plantar Foot o Change dressing every other day. Wound #7 Left Metatarsal head first o Change dressing every other day. Follow-up Appointments Wound #5 Left Calcaneus o Return Appointment in 1 week. Wound #6 Left,Plantar Foot o Return Appointment in 1 week. Wound #7 Left  Metatarsal head first o Return Appointment in 1 week. Off-Loading Wound #5 Left Calcaneus o Heel suspension boot to: - [employer] Wound #6 Left,Plantar Foot o Heel suspension boot to: - [employer] Wound #7 Left Metatarsal head first o Heel suspension boot to: - [place] Wound #5 Left [place] for [place] Nurse may visit PRN to address patientos wound care needs. o FACE TO FACE ENCOUNTER: MEDICARE and MEDICAID PATIENTS: I certify that this patient is under my care and that I had a face-to-face encounter that meets the physician face-to-face encounter requirements with this patient on this date. The encounter with the patient was in whole or in part for the following MEDICAL CONDITION: (primary reason for [place]) MEDICAL NECESSITY:  I certify, that based on my findings, NURSING services are a medically necessary home health service. HOME BOUND STATUS: I certify that my clinical findings support that this patient is homebound (i.e., Due to illness or injury, pt requires aid of supportive devices such as crutches, cane, wheelchairs, walkers, the use of special transportation or the assistance of another person to leave their place of residence. There is a Schrecengost, Raeden K. ([id number]) normal inability to leave the home and doing so requires considerable and taxing effort. Other absences are for medical reasons / religious services and are infrequent or of short duration when for other reasons). o If current dressing causes regression in wound condition, may D/C ordered dressing product/s and apply Normal Saline Moist Dressing daily until next [facility] / Other MD appointment. [place] of regression in wound condition at [phone]. o Please direct any NON-WOUND related issues/requests for orders to patient's Primary Care Physician Wound #6 [place] for [place] Nurse may visit PRN to address patientos wound care needs. o FACE TO FACE ENCOUNTER: MEDICARE and MEDICAID PATIENTS: I certify that this patient is under my care and that I had a face-to-face encounter that meets the physician face-to-face encounter requirements with this patient on this date. The encounter with the patient was in whole or in part for the following MEDICAL CONDITION: (primary reason for [place]) MEDICAL NECESSITY: I certify, that based on my findings, NURSING services are a medically necessary home health service. HOME BOUND STATUS: I certify that my clinical findings support that this patient is homebound (i.e., Due to illness or injury, pt requires aid of supportive devices such as crutches, cane, wheelchairs, walkers, the use of special transportation or the assistance of another person to leave their place of residence. There is a normal inability to leave the home and doing so requires considerable and taxing effort. Other absences are for medical reasons / religious services and are infrequent or of short duration when for other reasons). o If current dressing causes regression in wound condition, may D/C ordered dressing product/s and apply Normal Saline Moist Dressing daily until next [place] / Other MD appointment. [place] of regression in wound condition at [phone]. o Please direct any NON-WOUND related issues/requests for orders to patient's Primary Care Physician Wound #7 Left Metatarsal head first o [place] for [place] Nurse may visit PRN to address patientos wound care needs. o FACE TO FACE ENCOUNTER: MEDICARE and MEDICAID PATIENTS: I certify that this patient is under my care and that I had a face-to-face encounter that meets the physician face-to-face encounter requirements with this patient  on this date. The encounter with the patient was in whole or in part for the following MEDICAL CONDITION: (primary reason for [place]) MEDICAL NECESSITY: I certify, that based on my findings, NURSING services are a medically necessary home health service. HOME BOUND STATUS: I certify that my clinical findings support that this patient is homebound (i.e., Due to illness or injury, pt requires aid of supportive devices such as crutches, cane, wheelchairs, walkers, the use of special transportation or the assistance of another person to leave their place of residence. There is a normal inability to leave the home and doing so requires considerable and taxing effort. Other absences are for medical reasons / religious services and are infrequent or of short duration when for other  reasons). o If current dressing causes regression in wound condition, may D/C ordered dressing product/s and apply Normal Saline Moist Dressing daily until next [place] / Other MD appointment. [place] of regression in wound condition at [phone]. o Please direct any NON-WOUND related issues/requests for orders to patient's Primary Care Physician JAYMES, HANG ([id number]) Medications-please add to medication list. Wound #5 Left Calcaneus o P.O. Antibiotics - Keflex 555m TID x 10days Wound #6 Left,Plantar Foot o P.O. Antibiotics - Keflex 5054mTID x 10days Wound #7 Left Metatarsal head first o P.O. Antibiotics - Keflex 50024mID x 10days Electronic Signature(s) Signed: 07/27/2015 5:07:22 PM By: AffRegan LemmingN, RN Signed: 07/27/2015 5:19:28 PM By: RobLinton Ham Entered By: AffRegan Lemming 07/27/2015 12:50:33 [place], ELMJunie [id number]------------------------------------------------------------------------------- Problem List Details Patient Name: HENThomasenia Bottoms Date of Service: 07/27/2015 10:00 AM Medical Record Patient Account Number:  [id number]: Treating RN: AffBaruch GoutyN, BSN, Rita 8/11928/01/[id number] [phone]. 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 ([id number]------------------------------------------------------------------------------- Progress Note Details Patient Name: HENThomasenia Bottoms Date of Service: 07/27/2015 10:00 AM Medical Record Patient Account Number: [id number]: Treating RN: AffBaruch GoutyN, BSN, Rita [birth year], [phone]. Other Clinician: Date of Birth/Sex: Male) Treating Jettson Crable Primary Care Physician: CRIGolden Popysician/Extender: G Referring Physician: CRIGolden Popeks in Treatment: 1 Subjective Chief Complaint Information obtained from Patient Has had swelling of both lower extremities with some ulceration on the right lower extremity. 07/20/15 patient returns today predominantly for a painful wound on the plantar aspect of his left foot of recent onset. History of Present Illness (HPI) The following HPI elements were documented for the patient's wound: Location: had swelling of the right lower extremity with some ulceration on the medial part of his calf and on the right fifth toe Quality:  Patient reports No Pain. Severity: Patient states wound (s) are getting better. Duration: Patient has had the wound for < 2 weeks prior to presenting for treatment Context: The wound appeared gradually over time. nail on his right fifth toe came off loose and he pried it Modifying Factors: Other treatment(s) tried include:vascular workup where he is going to be seeing Dr. DewLucky Cowboy Monday Associated Signs and Symptoms: Patient reports presence of swelling Very pleasant 89 77ar old with a past medical history significant for type 2 diabetes. He underwent left knee surgery in February 2015. He had an angioplasty of his R posterior tibial on 10/30/13. Vascular workup done in April of this year showed an ABI on the right to be noncompressible and the left to be 0.86. About a week ago he noticed some swelling on the right lower extremity and then an ulceration on the medial part of his calf. He also noted that the right fifth nail bed had a loose nail and he pried it open and now has an open wound. He  has no evidence of cellulitis. he was seen by the nurse practitioner at his PCPs office and put on Bactroban ointment and doxycycline for 10 days. 01/21/2015 -- he was seen by Dr. Ronalee Belts and though we do not have the note, we know he had used an Unna's boot and asked him to come to see as again for a compression bandage. The patient has no fresh complaints. 07/20/15; the patient returns today predominantly for her wounds on his left plantar foot. He was seen here in the summer last on 8/19 for wounds on his right medial calf and right fifth nail bed. I don't know that he actually returned to be discharged, he is wearing compression on bilateral legs. He has known peripheral arterial disease in the setting of type 2 diabetes having an angioplasty on the right posterior tibial artery on 10/30/13. His ABI calculated in the clinic today was 0.97 on the right. I don't think the left could be done. The patient  tells me that roughly a week ago he started to develop pain in the plantar aspect of his left foot. I [name], [name] ([id number]) think his caregiver noticed the wounds and he is here for our review events. He is minimally ambulatory, spends most of his time in a wheelchair. I am not certain of the issue here. 07/27/15; the patient is here for 3 wounds. The first over his left first toe, second over the left medial heel and the fourth over the left fourth metatarsal. Culture from the fourth metatarsal head last week showed Klebsiella. This would not be sensitive to the doxycycline possibly I have therefore changed him to Keflex to which the Klebsiella should be sensitive Objective Constitutional Vitals Time Taken: 10:12 AM, Height: 72 in, Weight: 195 lbs, BMI: 26.4, Temperature: 97.8 F, Pulse: 75 bpm, Respiratory Rate: 19 breaths/min, Blood Pressure: 152/64 mmHg. Integumentary (Hair, Skin) Wound #5 status is Open. Original cause of wound was Blister. The wound is located on the Left Calcaneus. The wound measures 1cm length x 1.5cm width x 0.1cm depth; 1.178cm^2 area and 0.118cm^3 volume. The wound is limited to skin breakdown. There is no tunneling or undermining noted. There is a none present amount of drainage noted. The wound margin is distinct with the outline attached to the wound base. There is no granulation within the wound bed. There is a large (67-100%) amount of necrotic tissue within the wound bed including Eschar. The periwound skin appearance exhibited: Dry/Scaly. The periwound skin appearance did not exhibit: Callus, Crepitus, Excoriation, Fluctuance, Friable, Induration, Localized Edema, Rash, Scarring, Maceration, Moist, Atrophie Blanche, Cyanosis, Ecchymosis, Hemosiderin Staining, Mottled, Pallor, Rubor, Erythema. Periwound temperature was noted as No Abnormality. The periwound has tenderness on palpation. Wound #6 status is Open. Original cause of wound was Gradually  Appeared. The wound is located on the [place]. The wound measures 0.5cm length x 1cm width x 0.1cm depth; 0.393cm^2 area and 0.039cm^3 volume. The wound is limited to skin breakdown. There is no tunneling or undermining noted. There is a none present amount of drainage noted. The wound margin is indistinct and nonvisible. There is no granulation within the wound bed. There is a large (67-100%) amount of necrotic tissue within the wound bed including Eschar. The periwound skin appearance exhibited: Callus, Dry/Scaly. The periwound skin appearance did not exhibit: Crepitus, Excoriation, Fluctuance, Friable, Induration, Localized Edema, Rash, Scarring, Maceration, Moist, Atrophie Blanche, Cyanosis, Ecchymosis, Hemosiderin Staining, Mottled, Pallor, Rubor, Erythema. Periwound temperature was noted as No Abnormality. The periwound has tenderness  on palpation. Wound #7 status is Open. Original cause of wound was Gradually Appeared. The wound is located on the Left Metatarsal head first. The wound measures 1.2cm length x 1.2cm width x 0.1cm depth; 1.131cm^2 area and 0.113cm^3 volume. The wound is limited to skin breakdown. There is no tunneling or undermining noted. There is a none present amount of drainage noted. The wound margin is indistinct and nonvisible. There is no granulation within the wound bed. There is a large (67-100%) amount of necrotic tissue within the wound bed including Eschar. The periwound skin appearance exhibited: Callus, Dry/Scaly. The periwound skin appearance did not exhibit: Crepitus, Excoriation, Fluctuance, Friable, Induration, Localized Edema, Rash, Scarring, Maceration, Moist, Atrophie Blanche, Cyanosis, Ecchymosis, Hemosiderin [name], [name]) Staining, Mottled, Pallor, Rubor, Erythema. Periwound temperature was noted as No Abnormality. The periwound has tenderness on palpation. Wound #8 status is Healed - Epithelialized. Original cause of wound  was Gradually Appeared. The wound is located on the Right,[employer] The wound measures 0cm length x 0cm width x 0cm depth; 0cm^2 area and 0cm^3 volume. The wound is limited to skin breakdown. There is no tunneling or undermining noted. There is a none present amount of drainage noted. The wound margin is distinct with the outline attached to the wound base. There is no granulation within the wound bed. There is no necrotic tissue within the wound bed. The periwound skin appearance did not exhibit: Callus, Crepitus, Excoriation, Fluctuance, Friable, Induration, Localized Edema, Rash, Scarring, Dry/Scaly, Maceration, Moist, Atrophie Blanche, Cyanosis, Ecchymosis, Hemosiderin Staining, Mottled, Pallor, Rubor, Erythema. Periwound temperature was noted as No Abnormality. Assessment Active Problems ICD-10 L97.522 - Non-pressure chronic ulcer of other part of left foot with fat layer exposed L97.511 - Non-pressure chronic ulcer of other part of right foot limited to breakdown of skin E11.621 - Type 2 diabetes mellitus with foot ulcer E11.51 - Type 2 diabetes mellitus with diabetic peripheral angiopathy without gangrene Procedures Wound #5 Wound #5 is a Diabetic Wound/Ulcer of the Lower Extremity located on the Left Calcaneus . There was a Skin/Subcutaneous Tissue Debridement ([id number]) debridement with total area of 1.5 sq cm performed by [name], MD. with the following instrument(s): Curette to remove Non-Viable tissue/material including Fibrin/Slough and Subcutaneous after achieving pain control using Lidocaine 4% Topical Solution. A time out was conducted prior to the start of the procedure. A Minimum amount of bleeding was controlled with Pressure. The procedure was tolerated well with a pain level of 0 throughout and a pain level of 0 following the procedure. Post Debridement Measurements: 1cm length x 1.5cm width x 0.2cm depth; 0.236cm^3 volume. Post procedure Diagnosis  Wound #5: Same as Pre-Procedure Wound #6 Wound #6 is a Diabetic Wound/Ulcer of the Lower Extremity located on the Left,Plantar Foot . There was a Skin/Subcutaneous Tissue Debridement ([id number]) debridement with total area of 0.5 sq cm performed by [name], MD. with the following instrument(s): Curette to remove Non-Viable tissue/material including Exudate, Fibrin/Slough, and Subcutaneous after achieving pain control using Gatley, Hoke K. ([id number]) Lidocaine 4% Topical Solution. A time out was conducted prior to the start of the procedure. A Minimum amount of bleeding was controlled with Pressure. The procedure was tolerated well with a pain level of 0 throughout and a pain level of 0 following the procedure. Post Debridement Measurements: 0.5cm length x 2cm width x 0.1cm depth; 0.079cm^3 volume. Post procedure Diagnosis Wound #6: Same as Pre-Procedure Wound #7 Wound #7 is a Diabetic Wound/Ulcer of the Lower Extremity located on  the Left Metatarsal head first . There was a Skin/Subcutaneous Tissue Debridement ([id number]) debridement with total area of 1.44 sq cm performed by [name], MD. with the following instrument(s): Curette to remove Non- Viable tissue/material including Fibrin/Slough and Subcutaneous after achieving pain control using Lidocaine 4% Topical Solution. A time out was conducted prior to the start of the procedure. A Minimum amount of bleeding was controlled with Pressure. The procedure was tolerated well with a pain level of 0 throughout and a pain level of 0 following the procedure. Post Debridement Measurements: 1.2cm length x 1.2cm width x 0.1cm depth; 0.113cm^3 volume. Post procedure Diagnosis Wound #7: Same as Pre-Procedure Plan Wound Cleansing: Wound #5 Left Calcaneus: Clean wound with Normal Saline. Wound #6 Left,Plantar Foot: Clean wound with Normal Saline. Wound #7 Left Metatarsal head first: Clean wound with Normal  Saline. Anesthetic: Wound #5 Left Calcaneus: Topical Lidocaine 4% cream applied to wound bed prior to debridement Wound #6 Left,Plantar Foot: Topical Lidocaine 4% cream applied to wound bed prior to debridement Wound #7 Left Metatarsal head first: Topical Lidocaine 4% cream applied to wound bed prior to debridement Primary Wound Dressing: Wound #5 Left Calcaneus: Aquacel Ag Wound #6 Left,Plantar Foot: Aquacel Ag Wound #7 Left Metatarsal head first: Aquacel Ag Secondary Dressing: Wound #5 Left Calcaneus: Gauze and Kerlix/Conform Wound #6 Left,Plantar Foot: Pelzel, Taitum K. ([id number]) Gauze and Kerlix/Conform Wound #7 Left Metatarsal head first: Gauze and Kerlix/Conform Dressing Change Frequency: Wound #5 Left Calcaneus: Change dressing every other day. Wound #6 Left,Plantar Foot: Change dressing every other day. Wound #7 Left Metatarsal head first: Change dressing every other day. Follow-up Appointments: Wound #5 Left Calcaneus: Return Appointment in 1 week. Wound #6 Left,Plantar Foot: Return Appointment in 1 week. Wound #7 Left Metatarsal head first: Return Appointment in 1 week. Off-Loading: Wound #5 Left Calcaneus: Heel suspension boot to: - [employer] Wound #6 Left,Plantar Foot: Heel suspension boot to: - [employer] Wound #7 Left Metatarsal head first: Heel suspension boot to: - [place]: Wound #5 Left Calcaneus: [place] [facility] Nurse may visit PRN to address patient s wound care needs. FACE TO FACE ENCOUNTER: MEDICARE and MEDICAID PATIENTS: I certify that this patient is under my care and that I had a face-to-face encounter that meets the physician face-to-face encounter requirements with this patient on this date. The encounter with the patient was in whole or in part for the following MEDICAL CONDITION: (primary reason for [place]) MEDICAL NECESSITY: I certify, that based on my findings,  NURSING services are a medically necessary home health service. HOME BOUND STATUS: I certify that my clinical findings support that this patient is homebound (i.e., Due to illness or injury, pt requires aid of supportive devices such as crutches, cane, wheelchairs, walkers, the use of special transportation or the assistance of another person to leave their place of residence. There is a normal inability to leave the home and doing so requires considerable and taxing effort. Other absences are for medical reasons / religious services and are infrequent or of short duration when for other reasons). If current dressing causes regression in wound condition, may D/C ordered dressing product/s and apply Normal Saline Moist Dressing daily until next [place] / Other MD appointment. [place] of regression in wound condition at [phone]. Please direct any NON-WOUND related issues/requests for orders to patient's Primary Care Physician Wound #6 Left,Plantar Foot: [facility]  [place] Nurse may visit PRN to address patient s wound care needs. FACE TO FACE ENCOUNTER: MEDICARE and MEDICAID PATIENTS: I certify that this patient is under my care and that I had a face-to-face encounter that meets the physician face-to-face encounter requirements with this patient on this date. The encounter with the patient was in whole or in part for the following MEDICAL CONDITION: (primary reason for [place]) MEDICAL NECESSITY: I certify, that based on my findings, NURSING services are a medically necessary home health service. HOME BOUND STATUS: I certify that my clinical findings support that this patient is homebound (i.e., Due to QUANTAVIS, OBRYANT. ([id number]) illness or injury, pt requires aid of supportive devices such as crutches, cane, wheelchairs, walkers, the use of special transportation or the assistance of another person to  leave their place of residence. There is a normal inability to leave the home and doing so requires considerable and taxing effort. Other absences are for medical reasons / religious services and are infrequent or of short duration when for other reasons). If current dressing causes regression in wound condition, may D/C ordered dressing product/s and apply Normal Saline Moist Dressing daily until next [place] / Other MD appointment. [place] of regression in wound condition at [phone]. Please direct any NON-WOUND related issues/requests for orders to patient's Primary Care Physician Wound #7 Left Metatarsal head first: [facility] for [place] Nurse may visit PRN to address patient s wound care needs. FACE TO FACE ENCOUNTER: MEDICARE and MEDICAID PATIENTS: I certify that this patient is under my care and that I had a face-to-face encounter that meets the physician face-to-face encounter requirements with this patient on this date. The encounter with the patient was in whole or in part for the following MEDICAL CONDITION: (primary reason for [place]) MEDICAL NECESSITY: I certify, that based on my findings, NURSING services are a medically necessary home health service. HOME BOUND STATUS: I certify that my clinical findings support that this patient is homebound (i.e., Due to illness or injury, pt requires aid of supportive devices such as crutches, cane, wheelchairs, walkers, the use of special transportation or the assistance of another person to leave their place of residence. There is a normal inability to leave the home and doing so requires considerable and taxing effort. Other absences are for medical reasons / religious services and are infrequent or of short duration when for other reasons). If current dressing causes regression in wound condition, may D/C ordered dressing product/s and apply Normal  Saline Moist Dressing daily until next [place] / Other MD appointment. [place] of regression in wound condition at [phone]. Please direct any NON-WOUND related issues/requests for orders to patient's Primary Care Physician Medications-please add to medication list.: Wound #5 Left Calcaneus: P.O. Antibiotics - Keflex 545m TID x 10days Wound #6 Left,Plantar Foot: P.O. Antibiotics - Keflex 5078mTID x 10days Wound #7 Left Metatarsal head first: P.O. Antibiotics - Keflex 50070mID x 10days #1 debridement as noted #2 changed his antibiotic from doxycycline to Keflex #3 we continue Aquacel Ag to all wounds over the left calcaneus, left first metatarsal head and left fourth metatarsal head Electronic Signature(s) Signed: 07/27/2015 5:19:28 PM By: RobLinton Ham Entered By: RobLinton Ham 07/27/2015 13:12:03 Tuft, Teren K. ([id number]------------------------------------------------------------------------------- SuperBill Details Patient Name: HENThomasenia Bottoms Date of Service: 07/27/2015 Medical Record Patient Account Number: [id number]: Treating RN: Afful, RN,  BSN, Rita 01-Feb-1925 (82 y.o. Other Clinician: Date of Birth/Sex: Male) Treating Kenderick Kobler, [place] Primary Care Physician: Golden Pop Physician/Extender: G Referring Physician: Golden Pop Weeks in Treatment: 1 Diagnosis Coding ICD-10 Codes Code Description [phone] Non-pressure chronic ulcer of other part of left foot with fat layer exposed L97.511 Non-pressure chronic ulcer of other part of right foot limited to breakdown of skin E11.621 Type 2 diabetes mellitus with foot ulcer E11.51 Type 2 diabetes mellitus with diabetic peripheral angiopathy without gangrene Facility Procedures CPT4 Code Description: [id number] 11042 - DEB SUBQ TISSUE 20 SQ CM/< ICD-10 Description Diagnosis L97.522 Non-pressure chronic ulcer of other part of left foot Modifier: with fat  lay Quantity: 1 er exposed Physician Procedures CPT4 Code Description: [id number] 11042 - WC PHYS SUBQ TISS 20 SQ CM ICD-10 Description Diagnosis L97.522 Non-pressure chronic ulcer of other part of left foot Modifier: with fat laye Quantity: 1 r exposed Electronic Signature(s) Signed: 07/27/2015 5:19:28 PM By: [name] MD Entered By: [name] on 07/27/2015 13:12:22

## 2015-07-29 ENCOUNTER — Encounter: Payer: Self-pay | Admitting: Cardiovascular Disease

## 2015-07-29 ENCOUNTER — Ambulatory Visit (INDEPENDENT_AMBULATORY_CARE_PROVIDER_SITE_OTHER): Payer: PPO | Admitting: Cardiovascular Disease

## 2015-07-29 VITALS — BP 122/70 | HR 74 | Ht 72.0 in | Wt 195.0 lb

## 2015-07-29 DIAGNOSIS — I5032 Chronic diastolic (congestive) heart failure: Secondary | ICD-10-CM | POA: Diagnosis not present

## 2015-07-29 DIAGNOSIS — I4891 Unspecified atrial fibrillation: Secondary | ICD-10-CM | POA: Diagnosis not present

## 2015-07-29 DIAGNOSIS — R600 Localized edema: Secondary | ICD-10-CM

## 2015-07-29 DIAGNOSIS — E1165 Type 2 diabetes mellitus with hyperglycemia: Secondary | ICD-10-CM

## 2015-07-29 DIAGNOSIS — I350 Nonrheumatic aortic (valve) stenosis: Secondary | ICD-10-CM

## 2015-07-29 DIAGNOSIS — I10 Essential (primary) hypertension: Secondary | ICD-10-CM

## 2015-07-29 DIAGNOSIS — I2510 Atherosclerotic heart disease of native coronary artery without angina pectoris: Secondary | ICD-10-CM

## 2015-07-29 MED ORDER — FUROSEMIDE 40 MG PO TABS: 40.0000 mg | ORAL_TABLET | Freq: Two times a day (BID) | ORAL | Status: DC

## 2015-07-29 NOTE — Assessment & Plan Note (Addendum)
Continued leg edema on today's visit, possibly worse than on previous visits. He does report drinking significant iced tea daily. Recommended he increase Lasix up to 40 mg in the morning, 20 mg in the afternoon, up from 20 mg twice a day. Suggested leg elevation, compression hose/Ace wrap's His son is not with him today to help with instructions

## 2015-07-29 NOTE — Assessment & Plan Note (Signed)
Hemoglobin A1c 9.3. I suspect some dietary indiscretion, drinks sweet tea

## 2015-07-29 NOTE — Assessment & Plan Note (Signed)
Currently with no symptoms of angina. No further workup at this time. Continue current medication regimen. 

## 2015-07-29 NOTE — Progress Notes (Signed)
Patient ID: [name], male    DOB: 11-01-1924, 82 y.o.   MRN: [id number]  HPI Comments: [name] is an 82 year old gentleman with history of CAD, PCI of the circumflex in 2002, catheterization in 2010 showing patent circumflex and noncritical CAD with normal right-sided pressures, ejection fraction 50-55% April 2014 with mild aortic valve stenosis, gradient 2.26 m/s, chronic lower extremity swelling that per the notes has had a history of atrial fibrillation previously on pradaxa 150 mg daily, high fall risk who presents for routine followup  Of his coronary artery disease He has a history of Parkinson's  left leg surgery performed by [name].  Lives with his son Chronic leg edema bilaterally felt secondary to dependent edema, component of diastolic CHF  In follow-up, he reports that he has been having some leg swelling,  Developed a abscess on his left foot, scheduled to see [facility]. Reports it has been slow to heal. He does report having scans showing no osteomyelitis.  He reports having problems at nighttime, wakes up from sleep, feels like he can't breathe. Was diagnosed with sleep apnea years ago, reports that he is unable to tolerate CPAP prescribed at that time. Reports he is very symptomatic when he has these episodes. Takes him a while before he feels better  Lab work reviewed showing hemoglobin A1c 9.3.  EKG on today's visit shows rate 74 bpm, appears regular consistent with normal sinus rhythm with significant artifact, rare PVC  Other past medical history He does not like to wear compression hose.  Legs are down most of the day, has chronic leg edema.  He does report having some shortness of breath at nighttime, wakes up ,  Symptomatic, symptoms resolve after several seconds. He reports having diagnoses of sleep apnea but does not wear CPAP.  Sits most of the day in the chair, no longer walking secondary to chronic knee pain. in the past was  bothered by frequency of urination.  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 amiodarone (PACERONE) 200 MG tablet TAKE ONE-HALF TABLET TWICE A DAY  . carbidopa-levodopa (SINEMET IR) 25-100 MG per tablet Take 1 tablet by mouth 4 (four) times daily.  . Carbidopa-Levodopa ER (SINEMET CR) 25-100 MG tablet controlled release Take 1 tablet by mouth 2 (two) times daily.  . clopidogrel (PLAVIX) 75 MG tablet TAKE 1 TABLET EVERY DAY  . doxycycline (VIBRAMYCIN) 100 MG capsule Take 100 mg by mouth daily.   . furosemide (LASIX) 40 MG tablet Take 1 tablet (40 mg total) by mouth 2 (two) times daily.  Marland Kitchen levothyroxine (SYNTHROID, LEVOTHROID) 150 MCG tablet Take 150 mcg by mouth daily before breakfast.  . pantoprazole (PROTONIX) 40 MG tablet Take 1 tablet (40 mg total) by mouth daily.  . potassium chloride (K-DUR,KLOR-CON) 10 MEQ tablet TAKE ONE TABLET EVERY MORNING  . pramipexole (MIRAPEX) 1 MG tablet Take 1/2 tablet four times daily.  . sitaGLIPtin (JANUVIA) 100 MG tablet Take 1 tablet (100 mg total) by mouth  daily.  . tamsulosin (FLOMAX) 0.4 MG CAPS capsule Take 1 capsule (0.4 mg total) by mouth daily after supper.  . [DISCONTINUED] furosemide (LASIX) 20 MG tablet Take 20 mg by mouth daily.  . [DISCONTINUED] furosemide (LASIX) 20 MG tablet TAKE 1 TABLET TWICE A DAY AS NEEDED (Patient not taking: Reported on 07/29/2015)  . [DISCONTINUED] levothyroxine (SYNTHROID, LEVOTHROID) 175 MCG tablet Take 1 tablet (175 mcg total) by mouth daily before  breakfast. (Patient not taking: Reported on 07/29/2015)   No facility-administered encounter medications on file as of 07/29/2015.    Past Medical History  Diagnosis Date  . Coronary artery disease   . Diabetes mellitus without complication (HCC)   . Aortic stenosis   . Hypertension   . Hyperlipidemia   . TIA (transient ischemic attack)   . Thyroid disease   . Hypothyroidism     Past Surgical History  Procedure Laterality Date  . Appendectomy    . Hernia repair    . Knee surgery      bilateral  . Eye surgery    . Colonoscopy    . Cardiac catheterization  2010    showed a patient circumflex stent with noncritical CAD and normal right-sided pressures.  . Coronary angioplasty  01/24/2007    stent placement to the mid circumflex   . Knee surgery Left     Social History  reports that he has never smoked. He has never used smokeless tobacco. He reports that he does not drink alcohol or use illicit drugs.  Family History family history includes Heart attack (age of onset: 90) in his brother; Heart attack (age of onset: 34) in his father; Heart disease in his mother.   Review of Systems  Constitutional: Negative.   Eyes: Negative.   Respiratory: Negative.   Cardiovascular: Positive for leg swelling.  Gastrointestinal: Negative.   Musculoskeletal: Positive for gait problem.  Skin: Negative.   Neurological: Negative.   Hematological: Negative.   Psychiatric/Behavioral: Negative.   All other systems reviewed and are negative.  BP 122/70 mmHg  Pulse 74  Ht 6' (1.829 m)  Wt 195 lb (88.451 kg)  BMI 26.44 kg/m2  Physical Exam  Constitutional: He is oriented to person, place, and time. He appears well-developed and well-nourished.  Presents in a wheelchair  HENT:  Head: Normocephalic.  Nose: Nose normal.  Mouth/Throat: Oropharynx is clear and moist.  Eyes: Conjunctivae are normal. Pupils are equal, round, and reactive to light.  Neck: Normal range of motion. Neck  supple. No JVD present.  Cardiovascular: Normal rate, regular rhythm, S1 normal, S2 normal and intact distal pulses.  Exam reveals no gallop and no friction rub.   Murmur heard.  Systolic murmur is present with a grade of 2/6  1+ pitting edema to the midshin bilaterally lower extremity  Pulmonary/Chest: Effort normal and breath sounds normal. No respiratory distress. He has no wheezes. He has no rales. He exhibits no tenderness.  Scant crackles at the bases bilaterally with deep inspiration  Abdominal: Soft. Bowel sounds are normal. He exhibits no distension. There is no tenderness.  Musculoskeletal: Normal range of motion. He exhibits no edema or tenderness.  Lymphadenopathy:    He has no cervical adenopathy.  Neurological: He is alert and oriented to person, place, and time. Coordination normal.  Skin: Skin is warm and dry. No rash noted. No erythema.  Psychiatric: He has a normal mood and affect. His behavior is normal. Judgment and thought content normal.      Assessment  and Plan   Nursing note and vitals reviewed.

## 2015-07-29 NOTE — Assessment & Plan Note (Signed)
Blood pressure is well controlled on today's visit. No changes made to the medications.   Total encounter time more than 25 minutes  Greater than 50% was spent in counseling and coordination of care with the patient  

## 2015-07-29 NOTE — Assessment & Plan Note (Signed)
Mild degree of stenosis, medical management at this time 

## 2015-07-29 NOTE — Assessment & Plan Note (Signed)
Crackles at the bases, unclear if this is atelectasis or fluid overload. Also with edema, will advance his Lasix slowly

## 2015-07-29 NOTE — Assessment & Plan Note (Signed)
Maintaining normal sinus rhythm on his current medications. No medication changes made 

## 2015-07-29 NOTE — Patient Instructions (Addendum)
You are doing well.  See if Dr. Dossie Arbour can check a cholesterol level  Please increase the lasix up to 40 mg in the Am and 20 mg in the PM for leg swelling Please continue to use the compression hose  Please call us if you have new issues that need to be addressed before your next appt.  Your physician wants you to follow-up in: 3 months.  You will receive a reminder letter in the mail two months in advance. If you don't receive a letter, please call our office to schedule the follow-up appointment.

## 2015-08-03 ENCOUNTER — Ambulatory Visit: Payer: PPO | Admitting: Internal Medicine

## 2015-08-10 ENCOUNTER — Encounter: Payer: PPO | Attending: Internal Medicine | Admitting: Internal Medicine

## 2015-08-10 DIAGNOSIS — E11621 Type 2 diabetes mellitus with foot ulcer: Secondary | ICD-10-CM | POA: Diagnosis present

## 2015-08-10 DIAGNOSIS — L97511 Non-pressure chronic ulcer of other part of right foot limited to breakdown of skin: Secondary | ICD-10-CM | POA: Diagnosis not present

## 2015-08-10 DIAGNOSIS — E1140 Type 2 diabetes mellitus with diabetic neuropathy, unspecified: Secondary | ICD-10-CM | POA: Diagnosis not present

## 2015-08-10 DIAGNOSIS — E1151 Type 2 diabetes mellitus with diabetic peripheral angiopathy without gangrene: Secondary | ICD-10-CM | POA: Insufficient documentation

## 2015-08-10 DIAGNOSIS — I10 Essential (primary) hypertension: Secondary | ICD-10-CM | POA: Insufficient documentation

## 2015-08-10 DIAGNOSIS — L97522 Non-pressure chronic ulcer of other part of left foot with fat layer exposed: Secondary | ICD-10-CM | POA: Insufficient documentation

## 2015-08-10 DIAGNOSIS — G20 Parkinson's disease: Secondary | ICD-10-CM | POA: Insufficient documentation

## 2015-08-10 DIAGNOSIS — M1990 Unspecified osteoarthritis, unspecified site: Secondary | ICD-10-CM | POA: Diagnosis not present

## 2015-08-12 NOTE — Progress Notes (Signed)
CHANEY, MACLAREN ([id number]) Visit Report for 08/10/2015 Chief Complaint Document Details Patient Name: [name], [name]. Date of Service: 08/10/2015 10:00 AM Medical Record Patient Account Number: [id number] Number: Treating RN: Afful, RN, BSN, Velva Harman [birth year] [phone] y.o. Other Clinician: Date of Birth/Sex: Male) Treating Amberlee Garvey Primary Care Physician: Golden Pop Physician/Extender: G Referring Physician: Golden Pop Weeks in Treatment: 3 Information Obtained from: Patient Chief Complaint Has had swelling of both lower extremities with some ulceration on the right lower extremity. 07/20/15 patient returns today predominantly for a painful wound on the plantar aspect of his left foot of recent onset. Electronic Signature(s) Signed: 08/10/2015 4:53:46 PM By: [name] MD Entered By: [name] on 08/10/2015 13:50:07 [name], [name] ([id number]) -------------------------------------------------------------------------------- Debridement Details Patient Name: Chris Bottoms K. Date of Service: 08/10/2015 10:00 AM Medical Record Patient Account Number: [id number] Number: Treating RN: Afful, RN, BSN, Velva Harman 1925-05-28 [phone] y.o. Other Clinician: Date of Birth/Sex: Male) Treating Nori Winegar, [place] Primary Care Physician: Jeananne Rama, MARK Physician/Extender: G Referring Physician: Golden Pop Weeks in Treatment: 3 Debridement Performed for Wound #5 Left Calcaneus Assessment: Performed By: Physician [name], MD Debridement: Debridement Pre-procedure Yes Verification/Time Out Taken: Start Time: 10:44 Pain Control: Lidocaine 4% Topical Solution Level: Skin/Subcutaneous Tissue Total Area Debrided (L x 1 (cm) x 1.5 (cm) = 1.5 (cm) W): Tissue and other Non-Viable, Exudate, Fibrin/Slough, Subcutaneous material debrided: Instrument: Curette Bleeding: Minimum Hemostasis Achieved: Pressure End Time: 10:49 Procedural Pain: 0 Post  Procedural Pain: 0 Response to Treatment: Procedure was tolerated well Post Debridement Measurements of Total Wound Length: (cm) 1 Width: (cm) 1.5 Depth: (cm) 0.1 Volume: (cm) 0.118 Post Procedure Diagnosis Same as Pre-procedure Electronic Signature(s) Signed: 08/10/2015 4:53:46 PM By: [name] MD Signed: 08/11/2015 5:03:05 PM By: Regan Lemming BSN, RN Entered By: [name] on 08/10/2015 13:49:08 [name], [name] ([id number]) -------------------------------------------------------------------------------- Debridement Details Patient Name: Chris Bottoms K. Date of Service: 08/10/2015 10:00 AM Medical Record Patient Account Number: [id number] Number: Treating RN: Afful, RN, BSN, Velva Harman 1924-07-08 [phone] y.o. Other Clinician: Date of Birth/Sex: Male) Treating Ivery Nanney, [place] Primary Care Physician: Jeananne Rama, MARK Physician/Extender: G Referring Physician: Golden Pop Weeks in Treatment: 3 Debridement Performed for Wound #7 Left Metatarsal head first Assessment: Performed By: Physician [name], MD Debridement: Debridement Pre-procedure Yes Verification/Time Out Taken: Start Time: 10:40 Pain Control: Lidocaine 4% Topical Solution Level: Skin/Subcutaneous Tissue Total Area Debrided (L x 2.4 (cm) x 2 (cm) = 4.8 (cm) W): Tissue and other Non-Viable, Exudate, Fibrin/Slough, Subcutaneous material debrided: Instrument: Curette Bleeding: Minimum Hemostasis Achieved: Pressure End Time: 10:44 Procedural Pain: 0 Post Procedural Pain: 0 Response to Treatment: Procedure was tolerated well Post Debridement Measurements of Total Wound Length: (cm) 2.4 Width: (cm) 2 Depth: (cm) 0.1 Volume: (cm) 0.377 Post Procedure Diagnosis Same as Pre-procedure Electronic Signature(s) Signed: 08/10/2015 4:53:46 PM By: [name] MD Signed: 08/11/2015 5:03:05 PM By: Regan Lemming BSN, RN Entered By: [name] on 08/10/2015 13:49:22 [name], [name]  ([id number]) -------------------------------------------------------------------------------- HPI Details Patient Name: Chris Bottoms K. Date of Service: 08/10/2015 10:00 AM Medical Record Patient Account Number: [id number] Number: Treating RN: Afful, RN, BSN, Velva Harman 07/01/1924 [phone] y.o. Other Clinician: Date of Birth/Sex: Male) Treating Ramello Cordial Primary Care Physician: Golden Pop Physician/Extender: G Referring Physician: Golden Pop Weeks in Treatment: 3 History of Present Illness Location: had swelling of the right lower extremity with some ulceration on the medial part of his calf and on the right fifth toe Quality: Patient reports No Pain. Severity: Patient  states wound (s) are getting better. Duration: Patient has had the wound for < 2 weeks prior to presenting for treatment Context: The wound appeared gradually over time. nail on his right fifth toe came off loose and he pried it Modifying Factors: Other treatment(s) tried include:vascular workup where he is going to be seeing Dr. Lucky Cowboy on Monday Associated Signs and Symptoms: Patient reports presence of swelling HPI Description: Very pleasant 82 year old with a past medical history significant for type 2 diabetes. He underwent left knee surgery in February 2015. He had an angioplasty of his R posterior tibial on 10/30/13. Vascular workup done in April of this year showed an ABI on the right to be noncompressible and the left to be 0.86. About a week ago he noticed some swelling on the right lower extremity and then an ulceration on the medial part of his calf. He also noted that the right fifth nail bed had a loose nail and he pried it open and now has an open wound. He has no evidence of cellulitis. he was seen by the nurse practitioner at his PCPs office and put on Bactroban ointment and doxycycline for 10 days. 01/21/2015 -- he was seen by Dr. Ronalee Belts and though we do not have the note, we know he had used  an Unna's boot and asked him to come to see as again for a compression bandage. The patient has no fresh complaints. 07/20/15; the patient returns today predominantly for her wounds on his left plantar foot. He was seen here in the summer last on 8/19 for wounds on his right medial calf and right fifth nail bed. I don't know that he actually returned to be discharged, he is wearing compression on bilateral legs. He has known peripheral arterial disease in the setting of type 2 diabetes having an angioplasty on the right posterior tibial artery on 10/30/13. His ABI calculated in the clinic today was 0.97 on the right. I don't think the left could be done. The patient tells me that roughly a week ago he started to develop pain in the plantar aspect of his left foot. I think his caregiver noticed the wounds and he is here for our review events. He is minimally ambulatory, spends most of his time in a wheelchair. I am not certain of the issue here. 07/27/15; the patient is here for 3 wounds. The first over his left first met head, second over the left medial heel and the third over the left fourth metatarsal head. Culture from the fourth metatarsal head last week showed Klebsiella. This would not be sensitive to the doxycycline possibly I have therefore changed him to Keflex to which the Klebsiella should be sensitive 08/10/15; patient returns today. He was not seen last week. He does have home health. The area over the left fourth metatarsal head is almost completely closed the area over the medial aspect of his left first metatarsal head and the left medial heel still required debridement [name], [name] ([id number]) Electronic Signature(s) Signed: 08/10/2015 4:53:46 PM By: [name] MD Entered By: [name] on 08/10/2015 13:51:45 Packman, [name] ([id number]) -------------------------------------------------------------------------------- Physical Exam Details Patient Name: Chris Bottoms K. Date of Service: 08/10/2015 10:00 AM Medical Record Patient Account Number: [id number] Number: Treating RN: Afful, RN, BSN, Velva Harman Sep 14, 1924 [phone] y.o. Other Clinician: Date of Birth/Sex: Male) Treating Aliyanna Wassmer Primary Care Physician: Golden Pop Physician/Extender: G Referring Physician: Golden Pop Weeks in Treatment: 3 Notes Wound exam; the area over the heel on  the lateral aspect of the calcaneus in the lateral aspect of the first metatarsal head again require aggressive debridement. The area on the fourth metatarsal head is just about closed. There is no evidence of infection Electronic Signature(s) Signed: 08/10/2015 4:53:46 PM By: [name] MD Entered By: [name] on 08/10/2015 13:52:40 [name], [name] ([id number]) -------------------------------------------------------------------------------- Physician Orders Details Patient Name: Chris Bottoms K. Date of Service: 08/10/2015 10:00 AM Medical Record Patient Account Number: [id number] Number: Treating RN: Afful, RN, BSN, Velva Harman 1924/11/26 [phone] y.o. Other Clinician: Date of Birth/Sex: Male) Treating Jahyra Sukup Primary Care Physician: Golden Pop Physician/Extender: G Referring Physician: Golden Pop Weeks in Treatment: 3 Verbal / Phone Orders: Yes Clinician: Afful, RN, BSN, Rita Read Back and Verified: Yes Diagnosis Coding Wound Cleansing Wound #5 Left Calcaneus o Clean wound with Normal Saline. Wound #6 Left,Plantar Foot o Clean wound with Normal Saline. Wound #7 Left Metatarsal head first o Clean wound with Normal Saline. Anesthetic Wound #5 Left Calcaneus o Topical Lidocaine 4% cream applied to wound bed prior to debridement Wound #6 Left,Plantar Foot o Topical Lidocaine 4% cream applied to wound bed prior to debridement Wound #7 Left Metatarsal head first o Topical Lidocaine 4% cream applied to wound bed prior to debridement Primary Wound  Dressing Wound #5 Left Calcaneus o Aquacel Ag Wound #6 Left,Plantar Foot o Aquacel Ag Wound #7 Left Metatarsal head first o Aquacel Ag Secondary Dressing Wound #5 Left Calcaneus o Gauze and Kerlix/Conform Wound #6 Left,Plantar Foot Ingalsbe, Eulan K. ([id number]) o Gauze and Kerlix/Conform Wound #7 Left Metatarsal head first o Gauze and Kerlix/Conform Dressing Change Frequency Wound #5 Left Calcaneus o Change dressing every other day. Wound #6 Left,Plantar Foot o Change dressing every other day. Wound #7 Left Metatarsal head first o Change dressing every other day. Follow-up Appointments Wound #5 Left Calcaneus o Return Appointment in 1 week. Wound #6 Left,Plantar Foot o Return Appointment in 1 week. Wound #7 Left Metatarsal head first o Return Appointment in 1 week. Off-Loading Wound #5 Left Calcaneus o Heel suspension boot to: - [employer] Wound #6 Left,Plantar Foot o Heel suspension boot to: - [employer] Wound #7 Left Metatarsal head first o Heel suspension boot to: - [place] Wound #5 Left [place] for [place] Nurse may visit PRN to address patientos wound care needs. o FACE TO FACE ENCOUNTER: MEDICARE and MEDICAID PATIENTS: I certify that this patient is under my care and that I had a face-to-face encounter that meets the physician face-to-face encounter requirements with this patient on this date. The encounter with the patient was in whole or in part for the following MEDICAL CONDITION: (primary reason for [place]) MEDICAL NECESSITY: I certify, that based on my findings, NURSING services are a medically necessary home health service. HOME BOUND STATUS: I certify that my clinical findings support that this patient is homebound (i.e., Due to illness or injury, pt requires aid of supportive devices such as crutches, cane, wheelchairs, walkers, the use of  special transportation or the assistance of another person to leave their place of residence. There is a Schiavi, [place] K. ([id number]) normal inability to leave the home and doing so requires considerable and taxing effort. Other absences are for medical reasons / religious services and are infrequent or of short duration when for other reasons). o If current dressing causes regression in wound condition, may D/C ordered dressing product/s and apply Normal Saline Moist Dressing daily  until next [place] / Other MD appointment. [place] of regression in wound condition at [phone]. o Please direct any NON-WOUND related issues/requests for orders to patient's Primary Care Physician Wound #6 [place] for [place] Nurse may visit PRN to address patientos wound care needs. o FACE TO FACE ENCOUNTER: MEDICARE and MEDICAID PATIENTS: I certify that this patient is under my care and that I had a face-to-face encounter that meets the physician face-to-face encounter requirements with this patient on this date. The encounter with the patient was in whole or in part for the following MEDICAL CONDITION: (primary reason for [place]) MEDICAL NECESSITY: I certify, that based on my findings, NURSING services are a medically necessary home health service. HOME BOUND STATUS: I certify that my clinical findings support that this patient is homebound (i.e., Due to illness or injury, pt requires aid of supportive devices such as crutches, cane, wheelchairs, walkers, the use of special transportation or the assistance of another person to leave their place of residence. There is a normal inability to leave the home and doing so requires considerable and taxing effort. Other absences are for medical reasons / religious services and are infrequent or of short duration when for other reasons). o If  current dressing causes regression in wound condition, may D/C ordered dressing product/s and apply Normal Saline Moist Dressing daily until next [place] / Other MD appointment. [place] of regression in wound condition at [phone]. o Please direct any NON-WOUND related issues/requests for orders to patient's Primary Care Physician Wound #7 Left Metatarsal head first o [place] for [place] Nurse may visit PRN to address patientos wound care needs. o FACE TO FACE ENCOUNTER: MEDICARE and MEDICAID PATIENTS: I certify that this patient is under my care and that I had a face-to-face encounter that meets the physician face-to-face encounter requirements with this patient on this date. The encounter with the patient was in whole or in part for the following MEDICAL CONDITION: (primary reason for [place]) MEDICAL NECESSITY: I certify, that based on my findings, NURSING services are a medically necessary home health service. HOME BOUND STATUS: I certify that my clinical findings support that this patient is homebound (i.e., Due to illness or injury, pt requires aid of supportive devices such as crutches, cane, wheelchairs, walkers, the use of special transportation or the assistance of another person to leave their place of residence. There is a normal inability to leave the home and doing so requires considerable and taxing effort. Other absences are for medical reasons / religious services and are infrequent or of short duration when for other reasons). o If current dressing causes regression in wound condition, may D/C ordered dressing product/s and apply Normal Saline Moist Dressing daily until next [place] / Other MD appointment. [place] of regression in wound condition at [phone]. o Please direct any NON-WOUND related issues/requests for orders to patient's  Primary Care Physician SAMEUL, TAGLE ([id number]) Medications-please add to medication list. Wound #5 Left Calcaneus o P.O. Antibiotics - Keflex 527m TID x 10days Wound #6 Left,Plantar Foot o P.O. Antibiotics - Keflex 5020mTID x 10days Wound #7 Left Metatarsal head first o P.O. Antibiotics - Keflex 50045mID x 10days Electronic Signature(s) Signed: 08/10/2015 4:53:46 PM By: RobLinton Ham Signed: 08/11/2015 5:03:05 PM By: AffRegan LemmingN, RN Entered By: AffBaruch Gouty  Rita on 08/10/2015 10:56:27 [name], [name] ([id number]) -------------------------------------------------------------------------------- Problem List Details Patient Name: [name], [name]. Date of Service: 08/10/2015 10:00 AM Medical Record Patient Account Number: [id number] Number: Treating RN: Afful, RN, BSN, Velva Harman 30-Nov-1924 [phone] y.o. 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 Date of Service: 08/10/2015 10:00 AM Medical Record Patient Account Number: [id number] Number: Treating RN:  Afful, RN, BSN, Velva Harman 17-May-1925 [phone] y.o. Other Clinician: Date of Birth/Sex: Male) Treating Myiah Petkus Primary Care Physician: Golden Pop Physician/Extender: G Referring Physician: Golden Pop Weeks in Treatment: 3 Subjective Chief Complaint Information obtained from Patient Has had swelling of both lower extremities with some ulceration on the right lower extremity. 07/20/15 patient returns today predominantly for a painful wound on the plantar aspect of his left foot of recent onset. History of Present Illness (HPI) The following HPI elements were documented for the patient's wound: Location: had swelling of the right lower extremity with some ulceration on the medial part of his calf and on the right fifth toe Quality: Patient reports No Pain. Severity: Patient states wound (s) are getting better. Duration: Patient has had the wound for < 2 weeks prior to presenting for treatment Context: The wound appeared gradually over time. nail on his right fifth toe came off loose and he pried it Modifying Factors: Other treatment(s) tried include:vascular workup where he is going to be seeing Dr. Lucky Cowboy on Monday Associated Signs and Symptoms: Patient reports presence of swelling Very pleasant 82 year old with a past medical history significant for type 2 diabetes. He underwent left knee surgery in February 2015. He had an angioplasty of his R posterior tibial on 10/30/13. Vascular workup done in April of this year showed an ABI on the right to be noncompressible and the left to be 0.86. About a week ago he noticed some swelling on the right lower extremity and then an ulceration on the medial part of his calf. He also noted that the right fifth nail bed had a loose nail and he pried it open and now has an open wound. He has no evidence of cellulitis. he was seen by the nurse practitioner at his PCPs office and put on Bactroban ointment and doxycycline for 10 days. 01/21/2015 -- he  was seen by Dr. Ronalee Belts and though we do not have the note, we know he had used an Unna's boot and asked him to come to see as again for a compression bandage. The patient has no fresh complaints. 07/20/15; the patient returns today predominantly for her wounds on his left plantar foot. He was seen here in the summer last on 8/19 for wounds on his right medial calf and right fifth nail bed. I don't know that he actually returned to be discharged, he is wearing compression on bilateral legs. He has known peripheral arterial disease  in the setting of type 2 diabetes having an angioplasty on the right posterior tibial artery on 10/30/13. His ABI calculated in the clinic today was 0.97 on the right. I don't think the left could be done. The patient tells me that roughly a week ago he started to develop pain in the plantar aspect of his left foot. I [name], [name] ([id number]) think his caregiver noticed the wounds and he is here for our review events. He is minimally ambulatory, spends most of his time in a wheelchair. I am not certain of the issue here. 07/27/15; the patient is here for 3 wounds. The first over his left first met head, second over the left medial heel and the third over the left fourth metatarsal head. Culture from the fourth metatarsal head last week showed Klebsiella. This would not be sensitive to the doxycycline possibly I have therefore changed him to Keflex to which the Klebsiella should be sensitive 08/10/15; patient returns today. He was not seen last week. He does have home health. The area over the left fourth metatarsal head is almost completely closed the area over the medial aspect of his left first metatarsal head and the left medial heel still required debridement Objective Constitutional Vitals Time Taken: 10:12 AM, Height: 72 in, Weight: 195 lbs, BMI: 26.4, Temperature: 97.6 F, Pulse: 64 bpm, Respiratory Rate: 18 breaths/min, Blood Pressure: 115/54  mmHg. Integumentary (Hair, Skin) Wound #5 status is Open. Original cause of wound was Blister. The wound is located on the Left Calcaneus. The wound measures 1cm length x 1.5cm width x 0.1cm depth; 1.178cm^2 area and 0.118cm^3 volume. The wound is limited to skin breakdown. There is no tunneling or undermining noted. There is a small amount of serosanguineous drainage noted. The wound margin is distinct with the outline attached to the wound base. There is small (1-33%) red, pink granulation within the wound bed. There is a medium (34-66%) amount of necrotic tissue within the wound bed including Eschar. The periwound skin appearance exhibited: Dry/Scaly, Moist. The periwound skin appearance did not exhibit: Callus, Crepitus, Excoriation, Fluctuance, Friable, Induration, Localized Edema, Rash, Scarring, Maceration, Atrophie Blanche, Cyanosis, Ecchymosis, Hemosiderin Staining, Mottled, Pallor, Rubor, Erythema. Periwound temperature was noted as No Abnormality. The periwound has tenderness on palpation. Wound #6 status is Open. Original cause of wound was Gradually Appeared. The wound is located on the [place]. The wound measures 0.1cm length x 0.1cm width x 0.1cm depth; 0.008cm^2 area and 0.001cm^3 volume. The wound is limited to skin breakdown. There is no tunneling or undermining noted. There is a none present amount of drainage noted. The wound margin is indistinct and nonvisible. There is no granulation within the wound bed. There is a large (67-100%) amount of necrotic tissue within the wound bed including Eschar. The periwound skin appearance exhibited: Callus, Dry/Scaly. The periwound skin appearance did not exhibit: Crepitus, Excoriation, Fluctuance, Friable, Induration, Localized Edema, Rash, Scarring, Maceration, Moist, Atrophie Blanche, Cyanosis, Ecchymosis, Hemosiderin Staining, Mottled, Pallor, Rubor, Erythema. Periwound temperature was noted as No Abnormality. The periwound  has tenderness on palpation. Wound #7 status is Open. Original cause of wound was Gradually Appeared. The wound is located on the Left Metatarsal head first. The wound measures 2.4cm length x 2cm width x 0.1cm depth; 3.77cm^2 area and 0.377cm^3 volume. The wound is limited to skin breakdown. There is no tunneling or undermining noted. There is a medium amount of serosanguineous drainage noted. The wound margin is indistinct and nonvisible. There is no granulation within the  wound bed. There is a large (67-100%) amount of necrotic [name], [name] ([id number]) tissue within the wound bed including Eschar. The periwound skin appearance exhibited: Callus, Dry/Scaly, Moist. The periwound skin appearance did not exhibit: Crepitus, Excoriation, Fluctuance, Friable, Induration, Localized Edema, Rash, Scarring, Maceration, Atrophie Blanche, Cyanosis, Ecchymosis, Hemosiderin Staining, Mottled, Pallor, Rubor, Erythema. Periwound temperature was noted as No Abnormality. The periwound has tenderness on palpation. Assessment Active Problems ICD-10 L97.522 - Non-pressure chronic ulcer of other part of left foot with fat layer exposed L97.511 - Non-pressure chronic ulcer of other part of right foot limited to breakdown of skin E11.621 - Type 2 diabetes mellitus with foot ulcer E11.51 - Type 2 diabetes mellitus with diabetic peripheral angiopathy without gangrene #1 we'll use Aquacel Ag to all wound areas. Covered with border foam. #2 to consider an advance treatment option here is no progression. Procedures Wound #5 Wound #5 is a Diabetic Wound/Ulcer of the Lower Extremity located on the Left Calcaneus . There was a Skin/Subcutaneous Tissue Debridement ([id number]) debridement with total area of 1.5 sq cm performed by [name], MD. with the following instrument(s): Curette to remove Non-Viable tissue/material including Exudate, Fibrin/Slough, and Subcutaneous after achieving pain control  using Lidocaine 4% Topical Solution. A time out was conducted prior to the start of the procedure. A Minimum amount of bleeding was controlled with Pressure. The procedure was tolerated well with a pain level of 0 throughout and a pain level of 0 following the procedure. Post Debridement Measurements: 1cm length x 1.5cm width x 0.1cm depth; 0.118cm^3 volume. Post procedure Diagnosis Wound #5: Same as Pre-Procedure Wound #7 Wound #7 is a Diabetic Wound/Ulcer of the Lower Extremity located on the Left Metatarsal head first . There was a Skin/Subcutaneous Tissue Debridement ([id number]) debridement with total area of 4.8 sq cm performed by [name], MD. with the following instrument(s): Curette to remove Non- Viable tissue/material including Exudate, Fibrin/Slough, and Subcutaneous after achieving pain control using Lidocaine 4% Topical Solution. A time out was conducted prior to the start of the procedure. A Minimum amount of bleeding was controlled with Pressure. The procedure was tolerated well with a pain level of 0 throughout and a pain level of 0 following the procedure. Post Debridement Measurements: 2.4cm Morris, Kiano K. ([id number]) length x 2cm width x 0.1cm depth; 0.377cm^3 volume. Post procedure Diagnosis Wound #7: Same as Pre-Procedure Plan Wound Cleansing: Wound #5 Left Calcaneus: Clean wound with Normal Saline. Wound #6 Left,Plantar Foot: Clean wound with Normal Saline. Wound #7 Left Metatarsal head first: Clean wound with Normal Saline. Anesthetic: Wound #5 Left Calcaneus: Topical Lidocaine 4% cream applied to wound bed prior to debridement Wound #6 Left,Plantar Foot: Topical Lidocaine 4% cream applied to wound bed prior to debridement Wound #7 Left Metatarsal head first: Topical Lidocaine 4% cream applied to wound bed prior to debridement Primary Wound Dressing: Wound #5 Left Calcaneus: Aquacel Ag Wound #6 Left,Plantar Foot: Aquacel Ag Wound #7 Left  Metatarsal head first: Aquacel Ag Secondary Dressing: Wound #5 Left Calcaneus: Gauze and Kerlix/Conform Wound #6 Left,Plantar Foot: Gauze and Kerlix/Conform Wound #7 Left Metatarsal head first: Gauze and Kerlix/Conform Dressing Change Frequency: Wound #5 Left Calcaneus: Change dressing every other day. Wound #6 Left,Plantar Foot: Change dressing every other day. Wound #7 Left Metatarsal head first: Change dressing every other day. Follow-up Appointments: Wound #5 Left Calcaneus: Return Appointment in 1 week. Wound #6 Left,Plantar Foot: Return Appointment in 1 week. [name], [name] ([id number]) Wound #7 Left Metatarsal head first: Return  Appointment in 1 week. Off-Loading: Wound #5 Left Calcaneus: Heel suspension boot to: - [employer] Wound #6 Left,Plantar Foot: Heel suspension boot to: - [employer] Wound #7 Left Metatarsal head first: Heel suspension boot to: - [place]: Wound #5 Left Calcaneus: [place] for [place] Nurse may visit PRN to address patient s wound care needs. FACE TO FACE ENCOUNTER: MEDICARE and MEDICAID PATIENTS: I certify that this patient is under my care and that I had a face-to-face encounter that meets the physician face-to-face encounter requirements with this patient on this date. The encounter with the patient was in whole or in part for the following MEDICAL CONDITION: (primary reason for [place]) MEDICAL NECESSITY: I certify, that based on my findings, NURSING services are a medically necessary home health service. HOME BOUND STATUS: I certify that my clinical findings support that this patient is homebound (i.e., Due to illness or injury, pt requires aid of supportive devices such as crutches, cane, wheelchairs, walkers, the use of special transportation or the assistance of another person to leave their place of residence. There is a normal inability to leave the home and doing so  requires considerable and taxing effort. Other absences are for medical reasons / religious services and are infrequent or of short duration when for other reasons). If current dressing causes regression in wound condition, may D/C ordered dressing product/s and apply Normal Saline Moist Dressing daily until next [place] / Other MD appointment. [place] of regression in wound condition at [phone]. Please direct any NON-WOUND related issues/requests for orders to patient's Primary Care Physician Wound #6 Left,Plantar Foot: [place] for [place] Nurse may visit PRN to address patient s wound care needs. FACE TO FACE ENCOUNTER: MEDICARE and MEDICAID PATIENTS: I certify that this patient is under my care and that I had a face-to-face encounter that meets the physician face-to-face encounter requirements with this patient on this date. The encounter with the patient was in whole or in part for the following MEDICAL CONDITION: (primary reason for [place]) MEDICAL NECESSITY: I certify, that based on my findings, NURSING services are a medically necessary home health service. HOME BOUND STATUS: I certify that my clinical findings support that this patient is homebound (i.e., Due to illness or injury, pt requires aid of supportive devices such as crutches, cane, wheelchairs, walkers, the use of special transportation or the assistance of another person to leave their place of residence. There is a normal inability to leave the home and doing so requires considerable and taxing effort. Other absences are for medical reasons / religious services and are infrequent or of short duration when for other reasons). If current dressing causes regression in wound condition, may D/C ordered dressing product/s and apply Normal Saline Moist Dressing daily until next [place] / Other MD appointment. [place] of regression in wound condition at [phone]. Please direct any NON-WOUND related issues/requests for orders to patient's Primary Care Physician Wound #7 Left Metatarsal head first: [place] for [place] Nurse may visit PRN to address patient s wound care needs. FACE TO FACE ENCOUNTER: MEDICARE and MEDICAID PATIENTS: I certify that this patient is under my care and that I had a face-to-face encounter that meets the physician face-to-face encounter requirements with this patient on this date. The encounter with the patient was in whole or in  part for the following MEDICAL CONDITION: (primary reason for Home Healthcare) MEDICAL NECESSITY: I certify, [name], [name]. ([id number]) that based on my findings, NURSING services are a medically necessary home health service. HOME BOUND STATUS: I certify that my clinical findings support that this patient is homebound (i.e., Due to illness or injury, pt requires aid of supportive devices such as crutches, cane, wheelchairs, walkers, the use of special transportation or the assistance of another person to leave their place of residence. There is a normal inability to leave the home and doing so requires considerable and taxing effort. Other absences are for medical reasons / religious services and are infrequent or of short duration when for other reasons). If current dressing causes regression in wound condition, may D/C ordered dressing product/s and apply Normal Saline Moist Dressing daily until next [place] / Other MD appointment. [place] of regression in wound condition at [phone]. Please direct any NON-WOUND related issues/requests for orders to patient's Primary Care Physician Medications-please add to medication list.: Wound #5 Left Calcaneus: P.O. Antibiotics - Keflex 528m TID x 10days Wound #6 Left,Plantar Foot: P.O. Antibiotics - Keflex 5055mTID x  10days Wound #7 Left Metatarsal head first: P.O. Antibiotics - Keflex 50078mID x 10days Electronic Signature(s) Signed: 08/10/2015 4:53:46 PM By: RobLinton Ham Entered By: RobLinton Ham 08/10/2015 13:54:33 Poynor, Darreon K. ([id number]------------------------------------------------------------------------------- SuperBill Details Patient Name: HENThomasenia Bottoms Date of Service: 08/10/2015 Medical Record Patient Account Number: [id number]: Treating RN: AffBaruch GoutyN, BSN, Rita [birth year], [phone]. 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 E11.621 Type 2 diabetes mellitus with foot ulcer E11.51 Type 2 diabetes mellitus with diabetic peripheral angiopathy without gangrene Facility Procedures CPT4 Code Description: [id number] - DEB SUBQ TISSUE 20 SQ CM/< ICD-10 Description Diagnosis L97.522 Non-pressure chronic ulcer of other part of left foot Modifier: with fat lay Quantity: 1 er exposed Physician Procedures CPT4 Code Description: [id number] - WC PHYS SUBQ TISS 20 SQ CM ICD-10 Description Diagnosis L97.522 Non-pressure chronic ulcer of other part of left foot Modifier: with fat laye Quantity: 1 r exposed Electronic Signature(s) Signed: 08/10/2015 4:53:46 PM By: RobLinton Ham Entered By: RobLinton Ham 08/10/2015 13:55:00

## 2015-08-12 NOTE — Progress Notes (Signed)
[name], Aodhan K. ([id number]) Visit Report for 08/10/2015 Arrival Information Details Patient Name: [name], [name] Date of Service: 08/10/2015 10:00 AM Medical Record Number: [id number] Patient Account Number: [id number] Date of Birth/Sex: 08-Aug-1924 [age] y.o. Male) Treating RN: Clover MealyAfful, RN, BSN, [place] Sink[place] Primary Care Physician: Vonita MossRISSMAN, MARK Other Clinician: Referring Physician: Vonita MossRISSMAN, MARK Treating Physician/Extender: Altamese CarolinaOBSON, MICHAEL G Weeks in Treatment: 3 Visit Information History Since Last Visit Added or deleted any medications: No Patient Arrived: Wheel Chair Any new allergies or adverse reactions: No Arrival Time: 10:09 Had a fall or experienced change in No activities of daily living that may affect Accompanied By: son risk of falls: Transfer Assistance: None Signs or symptoms of abuse/neglect since last No Patient Identification Verified: Yes visito Secondary Verification Process Yes Hospitalized since last visit: No Completed: Has Dressing in Place as Prescribed: Yes Patient Requires Transmission-Based No Pain Present Now: No Precautions: Patient Has Alerts: No Electronic Signature(s) Signed: 08/11/2015 5:03:05 PM By: Elpidio EricAfful, Rita BSN, RN Entered By: Elpidio EricAfful, Rita on 08/10/2015 10:10:02 [name], [name] ([id number]) -------------------------------------------------------------------------------- Encounter Discharge Information Details Patient Name: Chris RidingHENSLEY, [name] Date of Service: 08/10/2015 10:00 AM Medical Record Number: [id number] Patient Account Number: [id number] Date of Birth/Sex: 08-Aug-1924 [age] y.o. Male) Treating RN: Clover MealyAfful, RN, BSN, [place] Sinkita Primary Care Physician: Vonita MossRISSMAN, MARK Other Clinician: Referring Physician: Vonita MossRISSMAN, MARK Treating Physician/Extender: Altamese CarolinaOBSON, MICHAEL G Weeks in Treatment: 3 Encounter Discharge Information Items Discharge Pain Level: 0 Discharge Condition: Stable Ambulatory Status: Wheelchair Discharge Destination: Home Transportation:  Private Auto Accompanied By: son Schedule Follow-up Appointment: No Medication Reconciliation completed No and provided to Patient/Care Rodriquez Thorner: Provided on Clinical Summary of Care: 08/10/2015 Form Type Recipient Paper [facility] Electronic Signature(s) Signed: 08/10/2015 12:07:07 PM By: Elpidio EricAfful, Rita BSN, RN Previous Signature: 08/10/2015 10:53:31 AM Version By: Francie MassingKelly, Tia Entered By: Elpidio EricAfful, Rita on 08/10/2015 12:07:06 [name], [name] ([id number]) -------------------------------------------------------------------------------- Lower Extremity Assessment Details Patient Name: Chris RidingHENSLEY, [name] Date of Service: 08/10/2015 10:00 AM Medical Record Number: [id number] Patient Account Number: [id number] Date of Birth/Sex: 08-Aug-1924 [age] y.o. Male) Treating RN: Clover MealyAfful, RN, BSN, [place] Sinkita Primary Care Physician: Vonita MossRISSMAN, MARK Other Clinician: Referring Physician: Vonita MossRISSMAN, MARK Treating Physician/Extender: Altamese CarolinaOBSON, MICHAEL G Weeks in Treatment: 3 Vascular Assessment Pulses: Posterior Tibial Dorsalis Pedis Palpable: [Left:Yes] Extremity colors, hair growth, and conditions: Extremity Color: [Left:Normal] Hair Growth on Extremity: [Left:No] Temperature of Extremity: [Left:Warm] Capillary Refill: [Left:< 3 seconds] Electronic Signature(s) Signed: 08/11/2015 5:03:05 PM By: Elpidio EricAfful, Rita BSN, RN Entered By: Elpidio EricAfful, Rita on 08/10/2015 10:10:26 [name], [name] ([id number]) -------------------------------------------------------------------------------- Multi Wound Chart Details Patient Name: Chris RidingHENSLEY, [name] Date of Service: 08/10/2015 10:00 AM Medical Record Number: [id number] Patient Account Number: [id number] Date of Birth/Sex: 08-Aug-1924 [age] y.o. Male) Treating RN: Clover MealyAfful, RN, BSN,  [place] Primary Care Physician: Vonita MossRISSMAN, MARK Other Clinician: Referring Physician: Vonita MossRISSMAN, MARK Treating Physician/Extender: Maxwell CaulOBSON, MICHAEL G Weeks in Treatment: 3 Vital Signs Height(in): 72 Pulse(bpm):  64 Weight(lbs): 195 Blood Pressure 115/54 (mmHg): Body Mass Index(BMI): 26 Temperature(F): 97.6 Respiratory Rate 18 (breaths/min): Photos: [5:No Photos] [6:No Photos] [7:No Photos] Wound Location: [5:Left Calcaneus] [6:Left Foot - Plantar] [7:Left Metatarsal head first] Wounding Event: [5:Blister] [6:Gradually Appeared] [7:Gradually Appeared] 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] [6:the Lower Extremity] [7:the Lower Extremity] Comorbid History: [5:Hypertension, Type II Diabetes, History of pressure wounds, Osteoarthritis, Neuropathy Osteoarthritis, Neuropathy Osteoarthritis, Neuropathy] [6:Hypertension, Type II Diabetes, History of pressure wounds,] [7:Hypertension, Type II  Diabetes, History of pressure wounds,] Date Acquired: [5:07/11/2015] [6:07/11/2015] [7:07/11/2015] Weeks of Treatment: [5:3] [6:3] [7:3] Wound Status: [5:Open] [  6:Open] [7:Open] Measurements L x W x D 1x1.5x0.1 [6:0.1x0.1x0.1] [7:2.4x2x0.1] (cm) Area (cm) : [5:1.178] [6:0.008] [7:3.77] Volume (cm) : [5:0.118] [6:0.001] [7:0.377] % Reduction in Area: [5:86.40%] [6:99.70%] [7:-220.00%] % Reduction in Volume: 86.30% [6:99.70%] [7:-190.00%] Classification: [5:Unable to visualize wound Unable to visualize wound Unable to visualize wound bed] [6:bed] [7:bed] Exudate Amount: [5:Small] [6:None Present] [7:Medium] Exudate Type: [5:Serosanguineous] [6:N/A] [7:Serosanguineous] Exudate Color: [5:red, brown] [6:N/A] [7:red, brown] Wound Margin: [5:Distinct, outline attached Indistinct, nonvisible] [7:Indistinct, nonvisible] Granulation Amount: [5:Small (1-33%)] [6:None Present (0%)] [7:None Present (0%)] Granulation Quality: [5:Red, Pink] [6:N/A] [7:N/A] Necrotic Amount: [5:Medium (34-66%)] [6:Large (67-100%)] [7:Large (67-100%)] Necrotic Tissue: [5:Eschar] [6:Eschar] [7:Eschar] Exposed Structures: [5:Fascia: No Fat: No] [6:Fascia: No Fat: No]  [7:Fascia: No Fat: No] Tendon: No Tendon: No Tendon: No Muscle: No Muscle: No Muscle: No Joint: No Joint: No Joint: No Bone: No Bone: No Bone: No Limited to Skin Limited to Skin Limited to Skin Breakdown Breakdown Breakdown Epithelialization: None None None Periwound Skin Texture: Edema: No Callus: Yes Callus: Yes Excoriation: No Edema: No Edema: No Induration: No Excoriation: No Excoriation: No Callus: No Induration: No Induration: No Crepitus: No Crepitus: No Crepitus: No Fluctuance: No Fluctuance: No Fluctuance: No Friable: No Friable: No Friable: No Rash: No Rash: No Rash: No Scarring: No Scarring: No Scarring: No Periwound Skin Moist: Yes Dry/Scaly: Yes Moist: Yes Moisture: Dry/Scaly: Yes Maceration: No Dry/Scaly: Yes Maceration: No Moist: No Maceration: No Periwound Skin Color: Atrophie Blanche: No Atrophie Blanche: No Atrophie Blanche: No Cyanosis: No Cyanosis: No Cyanosis: No Ecchymosis: No Ecchymosis: No Ecchymosis: No Erythema: No Erythema: No Erythema: No Hemosiderin Staining: No Hemosiderin Staining: No Hemosiderin Staining: No Mottled: No Mottled: No Mottled: No Pallor: No Pallor: No Pallor: No Rubor: No Rubor: No Rubor: No Temperature: No Abnormality No Abnormality No Abnormality Tenderness on Yes Yes Yes Palpation: Wound Preparation: Ulcer Cleansing: Ulcer Cleansing: Ulcer Cleansing: Rinsed/Irrigated with Rinsed/Irrigated with Rinsed/Irrigated with Saline Saline Saline Topical Anesthetic Topical Anesthetic Topical Anesthetic Applied: Other: lidocaine Applied: Other: lidocaine Applied: Other: lidocaine 4% 4% 4% Treatment Notes Electronic Signature(s) Signed: 08/11/2015 5:03:05 PM By: Elpidio Eric BSN, RN Entered By: Elpidio Eric on 08/10/2015 10:43:22 [name] ([id number]) -------------------------------------------------------------------------------- Multi-Disciplinary Care Plan Details Patient Name:  Chris Riding K. Date of Service: 08/10/2015 10:00 AM Medical Record Number: [id number] Patient Account Number: [id number] Date of Birth/Sex: [birth year] (82 y.o. Male) Treating RN: Clover Mealy, RN, BSN, [place] Sink Primary Care Physician: Vonita Moss Other Clinician: Referring Physician: Vonita Moss Treating Physician/Extender: Altamese [place] in Treatment: 3 Active Inactive Abuse / Safety / Falls / Self Care Management Nursing Diagnoses: Impaired home maintenance Impaired physical mobility Knowledge deficit related to: safety; personal, health (wound), emergency Potential for falls Self care deficit: actual or potential Goals: Patient will remain injury free Date Initiated: 07/20/2015 Goal Status: Active Patient/caregiver will verbalize understanding of skin care regimen Date Initiated: 07/20/2015 Goal Status: Active Patient/caregiver will verbalize/demonstrate measure taken to improve self care Date Initiated: 07/20/2015 Goal Status: Active Patient/caregiver will verbalize/demonstrate measures taken to improve the patient's personal safety Date Initiated: 07/20/2015 Goal Status: Active Patient/caregiver will verbalize/demonstrate measures taken to prevent injury and/or falls Date Initiated: 07/20/2015 Goal Status: Active Patient/caregiver will verbalize/demonstrate understanding of what to do in case of emergency Date Initiated: 07/20/2015 Goal Status: Active Interventions: Assess fall risk on admission and as needed Assess: immobility, friction, shearing, incontinence upon admission and as needed Assess impairment of mobility on admission and as needed per policy Assess self care needs on admission and as needed Patient referred  to community resources (specify in notes) LANGFORD, CARIAS. ([id number]) Provide education on basic hygiene Provide education on fall prevention Provide education on personal and home safety Provide education on safe transfers Notes: Orientation to  the Wound Care Program Nursing Diagnoses: Knowledge deficit related to the wound healing center program Goals: Patient/caregiver will verbalize understanding of the Wound Healing Center Program Date Initiated: 07/20/2015 Goal Status: Active Interventions: Provide education on orientation to the wound center Notes: Wound/Skin Impairment Nursing Diagnoses: Impaired tissue integrity Knowledge deficit related to ulceration/compromised skin integrity Goals: Patient/caregiver will verbalize understanding of skin care regimen Date Initiated: 07/20/2015 Goal Status: Active Ulcer/skin breakdown will have a volume reduction of 50% by week 8 Date Initiated: 07/20/2015 Goal Status: Active Ulcer/skin breakdown will have a volume reduction of 80% by week 12 Date Initiated: 07/20/2015 Goal Status: Active Ulcer/skin breakdown will heal within 14 weeks Date Initiated: 07/20/2015 Goal Status: Active Interventions: Assess patient/caregiver ability to obtain necessary supplies Assess patient/caregiver ability to perform ulcer/skin care regimen upon admission and as needed Assess ulceration(s) every visit ROSS, BENDER ([id number]) Provide education on ulcer and skin care Treatment Activities: Patient referred to home care : 08/10/2015 Skin care regimen initiated : 08/10/2015 Topical wound management initiated : 08/10/2015 Notes: Electronic Signature(s) Signed: 08/11/2015 5:03:05 PM By: Elpidio Eric BSN, RN Entered By: Elpidio Eric on 08/10/2015 10:43:05 [name] ([id number]) -------------------------------------------------------------------------------- Pain Assessment Details Patient Name: Chris Riding K. Date of Service: 08/10/2015 10:00 AM Medical Record Number: [id number] Patient Account Number: [id number] Date of Birth/Sex: 1925-05-09 (81 y.o. Male) Treating RN: Clover Mealy, RN, BSN, [place] Sink Primary Care Physician: Vonita Moss Other Clinician: Referring Physician: Vonita Moss Treating  Physician/Extender: Maxwell Caul Weeks in Treatment: 3 Active Problems Location of Pain Severity and Description of Pain Patient Has Paino No Site Locations Pain Management and Medication Current Pain Management: Electronic Signature(s) Signed: 08/11/2015 5:03:05 PM By: Elpidio Eric BSN, RN Entered By: Elpidio Eric on 08/10/2015 10:10:09 [name] ([id number]) -------------------------------------------------------------------------------- Patient/Caregiver Education Details Patient Name: Chris Riding K. Date of Service: 08/10/2015 10:00 AM Medical Record Number: [id number] Patient Account Number: [id number] Date of Birth/Gender: [birth year] (82 y.o. Male) Treating RN: Clover Mealy, RN, BSN, [place] Sink Primary Care Physician: Vonita Moss Other Clinician: Referring Physician: Vonita Moss Treating Physician/Extender: Altamese [place] in Treatment: 3 Education Assessment Education Provided To: Patient Education Topics Provided Basic Hygiene: Methods: Explain/Verbal Responses: State content correctly Safety: Methods: Explain/Verbal Responses: State content correctly Welcome To The [facility]: Methods: Explain/Verbal Responses: State content correctly Wound/Skin Impairment: Methods: Explain/Verbal Responses: State content correctly Electronic Signature(s) Signed: 08/10/2015 12:07:34 PM By: Elpidio Eric BSN, RN Entered By: Elpidio Eric on 08/10/2015 12:07:34 [name] ([id number]) -------------------------------------------------------------------------------- Wound Assessment Details Patient Name: Chris Riding K. Date of Service: 08/10/2015 10:00 AM Medical Record Number: [id number] Patient Account Number: [id number] Date of Birth/Sex: [birth year] (82 y.o. Male) Treating RN: Clover Mealy, RN, BSN, Rita Primary Care Physician: Vonita Moss Other Clinician: Referring Physician: Vonita Moss Treating Physician/Extender: Maxwell Caul Weeks in Treatment:  3 Wound Status Wound Number: 5 Primary Diabetic Wound/Ulcer of the Lower Etiology: Extremity Wound Location: Left Calcaneus Wound Open Wounding Event: Blister Status: Date Acquired: 07/11/2015 Comorbid Hypertension, Type II Diabetes, History Weeks Of Treatment: 3 History: of pressure wounds, Osteoarthritis, Clustered Wound: No Neuropathy Photos Photo Uploaded By: Elpidio Eric on 08/10/2015 16:20:46 Wound Measurements Length: (cm) 1 Width: (cm) 1.5 Depth: (cm) 0.1 Area: (cm) 1.178 Volume: (cm) 0.118 % Reduction in Area: 86.4% % Reduction in Volume: 86.3% Epithelialization: None  Tunneling: No Undermining: No Wound Description Classification: Unable to visualize wound bed Wound Margin: Distinct, outline attached Exudate Amount: Small Exudate Type: Serosanguineous [name], [name] ([id number]) Foul Odor After Cleansing: No Exudate Color: red, brown Wound Bed Granulation Amount: Small (1-33%) Exposed Structure Granulation Quality: Red, Pink Fascia Exposed: No Necrotic Amount: Medium (34-66%) Fat Layer Exposed: No Necrotic Quality: Eschar Tendon Exposed: No Muscle Exposed: No Joint Exposed: No Bone Exposed: No Limited to Skin Breakdown Periwound Skin Texture Texture Color No Abnormalities Noted: No No Abnormalities Noted: No Callus: No Atrophie Blanche: No Crepitus: No Cyanosis: No Excoriation: No Ecchymosis: No Fluctuance: No Erythema: No Friable: No Hemosiderin Staining: No Induration: No Mottled: No Localized Edema: No Pallor: No Rash: No Rubor: No Scarring: No Temperature / Pain Moisture Temperature: No Abnormality No Abnormalities Noted: No Tenderness on Palpation: Yes Dry / Scaly: Yes Maceration: No Moist: Yes Wound Preparation Ulcer Cleansing: Rinsed/Irrigated with Saline Topical Anesthetic Applied: Other: lidocaine 4%, Treatment Notes Wound #5 (Left Calcaneus) 1. Cleansed with: Clean wound with Normal Saline 4. Dressing  Applied: Aquacel Ag 5. Secondary Dressing Applied Bordered Foam Dressing Electronic Signature(s) Signed: 08/10/2015 10:40:12 AM By: Elpidio Eric BSN, RN Entered By: Elpidio Eric on 08/10/2015 10:40:12 [name] ([id number]) [name] ([id number]) -------------------------------------------------------------------------------- Wound Assessment Details Patient Name: Chris Riding K. Date of Service: 08/10/2015 10:00 AM Medical Record Number: [id number] Patient Account Number: [id number] Date of Birth/Sex: 05-22-1925 (82 y.o. Male) Treating RN: Clover Mealy, RN, BSN, Rita Primary Care Physician: Vonita Moss Other Clinician: Referring Physician: Vonita Moss Treating Physician/Extender: Maxwell Caul Weeks in Treatment: 3 Wound Status Wound Number: 6 Primary Diabetic Wound/Ulcer of the Lower Etiology: Extremity Wound Location: Left Foot - Plantar Wound Open Wounding Event: Gradually Appeared Status: Date Acquired: 07/11/2015 Comorbid Hypertension, Type II Diabetes, History Weeks Of Treatment: 3 History: of pressure wounds, Osteoarthritis, Clustered Wound: No Neuropathy Photos Photo Uploaded By: Elpidio Eric on 08/10/2015 16:21:34 Wound Measurements Length: (cm) 0.1 Width: (cm) 0.1 Depth: (cm) 0.1 Area: (cm) 0.008 Volume: (cm) 0.001 % Reduction in Area: 99.7% % Reduction in Volume: 99.7% Epithelialization: None Tunneling: No Undermining: No Wound Description Classification: Unable to visualize wound bed Wound Margin: Indistinct, nonvisible Exudate Amount: None Present Foul Odor After Cleansing: No Wound Bed Granulation Amount: None Present (0%) Exposed Structure Necrotic Amount: Large (67-100%) Fascia Exposed: No Necrotic Quality: Eschar Fat Layer Exposed: No Tendon Exposed: No Muscle Exposed: No Joint Exposed: No [name], [name] ([id number]) Bone Exposed: No Limited to Skin Breakdown Periwound Skin Texture Texture Color No Abnormalities Noted:  No No Abnormalities Noted: No Callus: Yes Atrophie Blanche: No Crepitus: No Cyanosis: No Excoriation: No Ecchymosis: No Fluctuance: No Erythema: No Friable: No Hemosiderin Staining: No Induration: No Mottled: No Localized Edema: No Pallor: No Rash: No Rubor: No Scarring: No Temperature / Pain Moisture Temperature: No Abnormality No Abnormalities Noted: No Tenderness on Palpation: Yes Dry / Scaly: Yes Maceration: No Moist: No Wound Preparation Ulcer Cleansing: Rinsed/Irrigated with Saline Topical Anesthetic Applied: Other: lidocaine 4%, Treatment Notes Wound #6 (Left, Plantar Foot) 1. Cleansed with: Clean wound with Normal Saline 4. Dressing Applied: Aquacel Ag 5. Secondary Dressing Applied Bordered Foam Dressing Electronic Signature(s) Signed: 08/11/2015 5:03:05 PM By: Elpidio Eric BSN, RN Entered By: Elpidio Eric on 08/10/2015 10:40:59 [name] ([id number]) -------------------------------------------------------------------------------- Wound Assessment Details Patient Name: Chris Riding K. Date of Service: 08/10/2015 10:00 AM Medical Record Number: [id number] Patient Account Number: [id number] Date of Birth/Sex: 08/01/1924 (82 y.o. Male) Treating RN: Clover Mealy, RN, BSN, [employer]  Care Physician: Vonita Moss Other Clinician: Referring Physician: Vonita Moss Treating Physician/Extender: Altamese [place] in Treatment: 3 Wound Status Wound Number: 7 Primary Diabetic Wound/Ulcer of the Lower Etiology: Extremity Wound Location: Left Metatarsal head first Wound Open Wounding Event: Gradually Appeared Status: Date Acquired: 07/11/2015 Comorbid Hypertension, Type II Diabetes, History Weeks Of Treatment: 3 History: of pressure wounds, Osteoarthritis, Clustered Wound: No Neuropathy Photos Photo Uploaded By: Elpidio Eric on 08/10/2015 16:21:35 Wound Measurements Length: (cm) 2.4 Width: (cm) 2 Depth: (cm) 0.1 Area: (cm) 3.77 Volume: (cm)  0.377 % Reduction in Area: -220% % Reduction in Volume: -190% Epithelialization: None Tunneling: No Undermining: No Wound Description Classification: Unable to visualize wound bed Wound Margin: Indistinct, nonvisible Exudate Amount: Medium Exudate Type: Serosanguineous Exudate Color: red, brown Foul Odor After Cleansing: No Wound Bed Granulation Amount: None Present (0%) Exposed Structure Necrotic Amount: Large (67-100%) Fascia Exposed: No Necrotic Quality: Eschar Fat Layer Exposed: No Tendon Exposed: No [name], [name] ([id number]) Muscle Exposed: No Joint Exposed: No Bone Exposed: No Limited to Skin Breakdown Periwound Skin Texture Texture Color No Abnormalities Noted: No No Abnormalities Noted: No Callus: Yes Atrophie Blanche: No Crepitus: No Cyanosis: No Excoriation: No Ecchymosis: No Fluctuance: No Erythema: No Friable: No Hemosiderin Staining: No Induration: No Mottled: No Localized Edema: No Pallor: No Rash: No Rubor: No Scarring: No Temperature / Pain Moisture Temperature: No Abnormality No Abnormalities Noted: No Tenderness on Palpation: Yes Dry / Scaly: Yes Maceration: No Moist: Yes Wound Preparation Ulcer Cleansing: Rinsed/Irrigated with Saline Topical Anesthetic Applied: Other: lidocaine 4%, Treatment Notes Wound #7 (Left Metatarsal head first) 1. Cleansed with: Clean wound with Normal Saline 4. Dressing Applied: Aquacel Ag 5. Secondary Dressing Applied Bordered Foam Dressing Electronic Signature(s) Signed: 08/11/2015 5:03:05 PM By: Elpidio Eric BSN, RN Entered By: Elpidio Eric on 08/10/2015 10:42:53 [name] ([id number]) -------------------------------------------------------------------------------- Vitals Details Patient Name: Chris Riding K. Date of Service: 08/10/2015 10:00 AM Medical Record Number: [id number] Patient Account Number: [id number] Date of Birth/Sex: 20-Mar-1925 (82 y.o. Male) Treating RN: Clover Mealy, RN, BSN,  [place] Primary Care Physician: Vonita Moss Other Clinician: Referring Physician: Vonita Moss Treating Physician/Extender: Altamese [place] in Treatment: 3 Vital Signs Time Taken: 10:12 Temperature (F): 97.6 Height (in): 72 Pulse (bpm): 64 Weight (lbs): 195 Respiratory Rate (breaths/min): 18 Body Mass Index (BMI): 26.4 Blood Pressure (mmHg): 115/54 Reference Range: 80 - 120 mg / dl Electronic Signature(s) Signed: 08/11/2015 5:03:05 PM By: Elpidio Eric BSN, RN Entered By: Elpidio Eric on 08/10/2015 10:13:07

## 2015-08-17 ENCOUNTER — Encounter: Payer: PPO | Admitting: Internal Medicine

## 2015-08-17 DIAGNOSIS — E11621 Type 2 diabetes mellitus with foot ulcer: Secondary | ICD-10-CM | POA: Diagnosis not present

## 2015-08-18 NOTE — Progress Notes (Signed)
[name] ([id number]) Visit Report for 08/17/2015 Chief Complaint Document Details Patient Name: [name], [name]. Date of Service: 08/17/2015 3:30 PM Medical Record Patient Account Number: [id number] Number: Treating RN: Baruch Gouty, RN, BSN, Rita [birth year] [phone] y.o. Other Clinician: Date of Birth/Sex: Male) Treating ROBSON, MICHAEL Primary Care Physician: Golden Pop Physician/Extender: G Referring Physician: Golden Pop Weeks in Treatment: 4 Information Obtained from: Patient Chief Complaint Has had swelling of both lower extremities with some ulceration on the right lower extremity. 07/20/15 patient returns today predominantly for a painful wound on the plantar aspect of his left foot of recent onset. Electronic Signature(s) Signed: 08/17/2015 4:45:15 PM By: [name] MD Entered By: [name] on 08/17/2015 16:13:43 [name], [name] ([id number]) -------------------------------------------------------------------------------- Debridement Details Patient Name: Chris Bottoms K. Date of Service: 08/17/2015 3:30 PM Medical Record Patient Account Number: [id number] Number: Treating RN: Baruch Gouty, RN, BSN, Rita [birth year] [phone] y.o. Other Clinician: Date of Birth/Sex: Male) Treating ROBSON, [place]per Primary Care Physician: Jeananne Rama, MARK Physician/Extender: G Referring Physician: Golden Pop Weeks in Treatment: 4 Debridement Performed for Wound #5 Left Calcaneus Assessment: Performed By: Physician [name], MD Debridement: Debridement Pre-procedure Yes Verification/Time Out Taken: Start Time: 16:05 Pain Control: Lidocaine 4% Topical Solution Level: Skin/Subcutaneous Tissue Total Area Debrided (L x 0.9 (cm) x 1.3 (cm) = 1.17 (cm) W): Tissue and other Non-Viable, Exudate, Fibrin/Slough, Subcutaneous material debrided: Instrument: Curette Bleeding: Minimum Hemostasis Achieved: Pressure End Time: 16:08 Procedural Pain: 0 Post  Procedural Pain: 0 Response to Treatment: Procedure was tolerated well Post Debridement Measurements of Total Wound Length: (cm) 0.9 Width: (cm) 1.3 Depth: (cm) 0.2 Volume: (cm) 0.184 Post Procedure Diagnosis Same as Pre-procedure Electronic Signature(s) Signed: 08/17/2015 4:45:15 PM By: [name] MD Signed: 08/17/2015 4:57:40 PM By: Regan Lemming BSN, RN Entered By: [name] on 08/17/2015 16:12:34 [name], [name] ([id number]) -------------------------------------------------------------------------------- Debridement Details Patient Name: Chris Bottoms K. Date of Service: 08/17/2015 3:30 PM Medical Record Patient Account Number: [id number] Number: Treating RN: Baruch Gouty, RN, BSN, Rita 1924/07/11 [phone] y.o. Other Clinician: Date of Birth/Sex: Male) Treating ROBSON, Welch Primary Care Physician: Jeananne Rama, MARK Physician/Extender: G Referring Physician: Golden Pop Weeks in Treatment: 4 Debridement Performed for Wound #7 Left Metatarsal head first Assessment: Performed By: Physician [name], MD Debridement: Debridement Pre-procedure Yes Verification/Time Out Taken: Start Time: 16:02 Pain Control: Lidocaine 4% Topical Solution Level: Skin/Subcutaneous Tissue Total Area Debrided (L x 2 (cm) x 2 (cm) = 4 (cm) W): Tissue and other Non-Viable, Exudate, Fibrin/Slough, Subcutaneous material debrided: Instrument: Curette Bleeding: Minimum Hemostasis Achieved: Pressure End Time: 16:05 Procedural Pain: 0 Post Procedural Pain: 0 Response to Treatment: Procedure was tolerated well Post Debridement Measurements of Total Wound Length: (cm) 2 Width: (cm) 2 Depth: (cm) 0.1 Volume: (cm) 0.314 Post Procedure Diagnosis Same as Pre-procedure Electronic Signature(s) Signed: 08/17/2015 4:45:15 PM By: [name] MD Signed: 08/17/2015 4:57:40 PM By: Regan Lemming BSN, RN Entered By: [name] on 08/17/2015 16:13:24 [name], [name]  ([id number]) -------------------------------------------------------------------------------- HPI Details Patient Name: Chris Bottoms K. Date of Service: 08/17/2015 3:30 PM Medical Record Patient Account Number: [id number] Number: Treating RN: Baruch Gouty, RN, BSN, Rita 02-07-1925 [phone] y.o. Other Clinician: Date of Birth/Sex: Male) Treating ROBSON, MICHAEL Primary Care Physician: Golden Pop Physician/Extender: G Referring Physician: Golden Pop Weeks in Treatment: 4 History of Present Illness Location: had swelling of the right lower extremity with some ulceration on the medial part of his calf and on the right fifth toe Quality: Patient reports No Pain. Severity: Patient  states wound (s) are getting better. Duration: Patient has had the wound for < 2 weeks prior to presenting for treatment Context: The wound appeared gradually over time. nail on his right fifth toe came off loose and he pried it Modifying Factors: Other treatment(s) tried include:vascular workup where he is going to be seeing Dr. Lucky Cowboy on Monday Associated Signs and Symptoms: Patient reports presence of swelling HPI Description: Very pleasant 82 year old with a past medical history significant for type 2 diabetes. He underwent left knee surgery in February 2015. He had an angioplasty of his R posterior tibial on 10/30/13. Vascular workup done in April of this year showed an ABI on the right to be noncompressible and the left to be 0.86. About a week ago he noticed some swelling on the right lower extremity and then an ulceration on the medial part of his calf. He also noted that the right fifth nail bed had a loose nail and he pried it open and now has an open wound. He has no evidence of cellulitis. he was seen by the nurse practitioner at his PCPs office and put on Bactroban ointment and doxycycline for 10 days. 01/21/2015 -- he was seen by Dr. Ronalee Belts and though we do not have the note, we know he had used  an Unna's boot and asked him to come to see as again for a compression bandage. The patient has no fresh complaints. 07/20/15; the patient returns today predominantly for her wounds on his left plantar foot. He was seen here in the summer last on 8/19 for wounds on his right medial calf and right fifth nail bed. I don't know that he actually returned to be discharged, he is wearing compression on bilateral legs. He has known peripheral arterial disease in the setting of type 2 diabetes having an angioplasty on the right posterior tibial artery on 10/30/13. His ABI calculated in the clinic today was 0.97 on the right. I don't think the left could be done. The patient tells me that roughly a week ago he started to develop pain in the plantar aspect of his left foot. I think his caregiver noticed the wounds and he is here for our review events. He is minimally ambulatory, spends most of his time in a wheelchair. I am not certain of the issue here. 07/27/15; the patient is here for 3 wounds. The first over his left first met head, second over the left medial heel and the third over the left fourth metatarsal head. Culture from the fourth metatarsal head last week showed Klebsiella. This would not be sensitive to the doxycycline possibly I have therefore changed him to Keflex to which the Klebsiella should be sensitive 08/10/15; patient returns today. He was not seen last week. He does have home health. The area over the left fourth metatarsal head is almost completely closed the area over the medial aspect of his left first metatarsal head and the left medial heel still required debridement [name], [name] ([id number]) 08/17/15 the area over the fourth metatarsal head has resolved. The area on his left first metatarsal head lateral aspect and the left medial heel still require surgical debridement although the surface is beginning to look a bit better. Is followed with vascular surgery and is apparently  being planned for an angiogram and possible stent on 3/28 Electronic Signature(s) Signed: 08/17/2015 4:45:15 PM By: [name] MD Entered By: [name] on 08/17/2015 16:15:05 Clift, [name] ([id number]) -------------------------------------------------------------------------------- Physical Exam Details Patient Name: Chris Bottoms  K. Date of Service: 08/17/2015 3:30 PM Medical Record Patient Account Number: [id number] Number: Treating RN: Baruch Gouty, RN, BSN, Rita 01-30-1925 [phone] y.o. Other Clinician: Date of Birth/Sex: Male) Treating ROBSON, MICHAEL Primary Care Physician: Golden Pop Physician/Extender: G Referring Physician: Golden Pop Weeks in Treatment: 4 Notes Exam; the area over the fourth metatarsal head on the left is healed. Both wounds on the medial aspect of the calcaneus and the lateral aspect of the first metatarsal head require debridement. There is no evidence of infection Electronic Signature(s) Signed: 08/17/2015 4:45:15 PM By: [name] MD Entered By: [name] on 08/17/2015 16:15:58 T[place]air, [name] ([id number]) -------------------------------------------------------------------------------- Physician Orders Details Patient Name: Chris Bottoms K. Date of Service: 08/17/2015 3:30 PM Medical Record Patient Account Number: [id number] Number: Treating RN: Baruch Gouty, RN, BSN, Rita 10/25/1924 [phone] y.o. Other Clinician: Date of Birth/Sex: Male) Treating ROBSON, MICHAEL Primary Care Physician: Golden Pop Physician/Extender: G Referring Physician: Golden Pop Weeks in Treatment: 4 Verbal / Phone Orders: Yes Clinician: Afful, RN, BSN, Rita Read Back and Verified: Yes Diagnosis Coding Wound Cleansing Wound #5 Left Calcaneus o Clean wound with Normal Saline. Wound #7 Left Metatarsal head first o Clean wound with Normal Saline. Anesthetic Wound #5 Left Calcaneus o Topical Lidocaine 4% cream applied to wound  bed prior to debridement Wound #7 Left Metatarsal head first o Topical Lidocaine 4% cream applied to wound bed prior to debridement Primary Wound Dressing Wound #5 Left Calcaneus o Iodoflex Wound #7 Left Metatarsal head first o Iodoflex Secondary Dressing Wound #5 Left Calcaneus o Gauze and Kerlix/Conform Wound #7 Left Metatarsal head first o Gauze and Kerlix/Conform Dressing Change Frequency Wound #5 Left Calcaneus o Change dressing every other day. Wound #7 Left Metatarsal head first o Change dressing every other day. HIGINIO, GROW ([id number]) Follow-up Appointments Wound #5 Left Calcaneus o Return Appointment in 1 week. Wound #7 Left Metatarsal head first o Return Appointment in 1 week. Off-Loading Wound #5 Left Calcaneus o Heel suspension boot to: - [employer] Wound #7 Left Metatarsal head first o Heel suspension boot to: - [place] Wound #5 Left [place] for [place] Nurse may visit PRN to address patientos wound care needs. o FACE TO FACE ENCOUNTER: MEDICARE and MEDICAID PATIENTS: I certify that this patient is under my care and that I had a face-to-face encounter that meets the physician face-to-face encounter requirements with this patient on this date. The encounter with the patient was in whole or in part for the following MEDICAL CONDITION: (primary reason for [place]) MEDICAL NECESSITY: I certify, that based on my findings, NURSING services are a medically necessary home health service. HOME BOUND STATUS: I certify that my clinical findings support that this patient is homebound (i.e., Due to illness or injury, pt requires aid of supportive devices such as crutches, cane, wheelchairs, walkers, the use of special transportation or the assistance of another person to leave their place of residence. There is a normal inability to leave the home and doing so  requires considerable and taxing effort. Other absences are for medical reasons / religious services and are infrequent or of short duration when for other reasons). o If current dressing causes regression in wound condition, may D/C ordered dressing product/s and apply Normal Saline Moist Dressing daily until next [place] / Other MD appointment. [place] of regression in wound condition at [phone]. o Please direct any NON-WOUND related issues/requests  for orders to patient's Primary Care Physician Wound #7 Left Metatarsal head first o [place] for [place] Nurse may visit PRN to address patientos wound care needs. o FACE TO FACE ENCOUNTER: MEDICARE and MEDICAID PATIENTS: I certify that this patient is under my care and that I had a face-to-face encounter that meets the physician face-to-face encounter requirements with this patient on this date. The encounter with the patient was in whole or in part for the following MEDICAL CONDITION: (primary reason for [place]) MEDICAL NECESSITY: I certify, that based on my findings, NURSING services are a medically necessary home health service. HOME BOUND STATUS: I certify that my clinical findings support that this patient is homebound (i.e., Due to illness or injury, pt requires aid of supportive devices such as crutches, cane, wheelchairs, walkers, the use of special transportation or the assistance of another person to leave their place of residence. There is a [place]na[place]on, Randee K. ([id number]) normal inability to leave the home and doing so requires considerable and taxing effort. Other absences are for medical reasons / religious services and are infrequent or of short duration when for other reasons). o If current dressing causes regression in wound condition, may D/C ordered dressing product/s and apply Normal Saline Moist Dressing daily until next  [place] / Other MD appointment. [place] of regression in wound condition at [phone]. o Please direct any NON-WOUND related issues/requests for orders to patient's Primary Care Physician Electronic Signature(s) Signed: 08/17/2015 4:45:15 PM By: [name] MD Signed: 08/17/2015 4:57:40 PM By: Regan Lemming BSN, RN Entered By: Regan Lemming on 08/17/2015 16:07:11 [place]o, [name] ([id number]) -------------------------------------------------------------------------------- Problem List Details Patient Name: Chris Bottoms K. Date of Service: 08/17/2015 3:30 PM Medical Record Patient Account Number: [id number] Number: Treating RN: Baruch Gouty, RN, BSN, Rita 02-24-1925 [phone] y.o. 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 Date of Service: 08/17/2015 3:30 PM Medical Record Patient Account Number: [id number] Number: Treating RN: Baruch Gouty, RN, BSN, Rita Oct 18, 1924 [phone] y.o. Other Clinician: Date of Birth/Sex: Male) Treating ROBSON,  MICHAEL Primary Care Physician: Golden Pop Physician/Extender: G Referring Physician: Golden Pop Weeks in Treatment: 4 Subjective Chief Complaint Information obtained from Patient Has had swelling of both lower extremities with some ulceration on the right lower extremity. 07/20/15 patient returns today predominantly for a painful wound on the plantar aspect of his left foot of recent onset. History of Present Illness (HPI) The following HPI elements were documented for the patient's wound: Location: had swelling of the right lower extremity with some ulceration on the medial part of his calf and on the right fifth toe Quality: Patient reports No Pain. Severity: Patient states wound (s) are getting better. Duration: Patient has had the wound for < 2 weeks prior to presenting for  treatment Context: The wound appeared gradually over time. nail on his right fifth toe came off loose and he pried it Modifying Factors: Other treatment(s) tried include:vascular workup where he is going to be seeing Dr. Lucky Cowboy on Monday Associated Signs and Symptoms: Patient reports presence of swelling Very pleasant 82 year old with a past medical history significant for type 2 diabetes. He underwent left knee surgery in February 2015. He had an angioplasty of his R posterior tibial on 10/30/13. Vascular workup done in April of this year showed an ABI on the right to be noncompressible and the left to be 0.86. About a week ago he noticed some swelling on the right lower extremity and then an ulceration on the medial part of his calf. He also noted that the right fifth nail bed had a loose nail and he pried it open and now has an open wound. He has no evidence of cellulitis. he was seen by the nurse practitioner at his PCPs office and put on Bactroban ointment and doxycycline for 10 days. 01/21/2015 -- he was seen by Dr. Ronalee Belts and though we do not have the note, we know he had used an Unna's boot and  asked him to come to see as again for a compression bandage. The patient has no fresh complaints. 07/20/15; the patient returns today predominantly for her wounds on his left plantar foot. He was seen here in the summer last on 8/19 for wounds on his right medial calf and right fifth nail bed. I don't know that he actually returned to be discharged, he is wearing compression on bilateral legs. He has known peripheral arterial disease in the setting of type 2 diabetes having an angioplasty on the right posterior tibial artery on 10/30/13. His ABI calculated in the clinic today was 0.97 on the right. I don't think the left could be done. The patient tells me that roughly a week ago he started to develop pain in the plantar aspect of his left foot. I [name], [name] ([id number]) think his caregiver noticed the wounds and he is here for our review events. He is minimally ambulatory, spends most of his time in a wheelchair. I am not certain of the issue here. 07/27/15; the patient is here for 3 wounds. The first over his left first met head, second over the left medial heel and the third over the left fourth metatarsal head. Culture from the fourth metatarsal head last week showed Klebsiella. This would not be sensitive to the doxycycline possibly I have therefore changed him to Keflex to which the Klebsiella should be sensitive 08/10/15; patient returns today. He was not seen last week. He does have home health. The area over the left fourth metatarsal head is almost completely closed the area over the medial aspect of his left first metatarsal head and the left medial heel still required debridement 08/17/15 the area over the fourth metatarsal head has resolved. The area on his left first metatarsal head lateral aspect and the left medial heel still require surgical debridement although the surface is beginning to look a bit better. Is followed with vascular surgery and is apparently being planned for an  angiogram and possible stent on 3/28 Objective Constitutional Vitals Time Taken: 3:43 PM, Height: 72 in, Weight: 195 lbs, BMI: 26.4, Temperature: 97.5 F, Pulse: 66 bpm, Respiratory Rate: 18 breaths/min, Blood Pressure: 143/62 mmHg. Integumentary (Hair, Skin) Wound #5 status is Open. Original cause of wound was Blister. The wound is located on the Left Calcaneus.  The wound measures 0.9cm length x 1.3cm width x 0.2cm depth; 0.919cm^2 area and 0.184cm^3 volume. The wound is limited to skin breakdown. There is no tunneling or undermining noted. There is a small amount of serosanguineous drainage noted. The wound margin is distinct with the outline attached to the wound base. There is small (1-33%) red, pink granulation within the wound bed. There is a medium (34-66%) amount of necrotic tissue within the wound bed including Eschar. The periwound skin appearance exhibited: Localized Edema, Dry/Scaly, Moist. The periwound skin appearance did not exhibit: Callus, Crepitus, Excoriation, Fluctuance, Friable, Induration, Rash, Scarring, Maceration, Atrophie Blanche, Cyanosis, Ecchymosis, Hemosiderin Staining, Mottled, Pallor, Rubor, Erythema. Periwound temperature was noted as No Abnormality. The periwound has tenderness on palpation. Wound #6 status is Open. Original cause of wound was Gradually Appeared. The wound is located on the [place]. The wound measures 0cm length x 0cm width x 0cm depth; 0cm^2 area and 0cm^3 volume. The wound is limited to skin breakdown. There is no tunneling or undermining noted. There is a none present amount of drainage noted. The wound margin is indistinct and nonvisible. There is no granulation within the wound bed. There is no necrotic tissue within the wound bed. The periwound skin appearance exhibited: Callus, Dry/Scaly. The periwound skin appearance did not exhibit: Crepitus, Excoriation, Fluctuance, Friable, Induration, Localized Edema, Rash, Scarring,  Maceration, Moist, Atrophie Blanche, Cyanosis, Ecchymosis, Hemosiderin Staining, Mottled, Pallor, Rubor, Erythema. Periwound temperature was noted as No Abnormality. Wound #7 status is Open. Original cause of wound was Gradually Appeared. The wound is located on the Left Metatarsal head first. The wound measures 2cm length x 2cm width x 0.1cm depth; 3.142cm^2 area [name], [name] ([id number]) and 0.314cm^3 volume. The wound is limited to skin breakdown. There is no tunneling or undermining noted. There is a medium amount of serosanguineous drainage noted. The wound margin is indistinct and nonvisible. There is small (1-33%) granulation within the wound bed. There is a medium (34-66%) amount of necrotic tissue within the wound bed including Adherent Slough. The periwound skin appearance exhibited: Callus, Dry/Scaly, Moist. The periwound skin appearance did not exhibit: Crepitus, Excoriation, Fluctuance, Friable, Induration, Localized Edema, Rash, Scarring, Maceration, Atrophie Blanche, Cyanosis, Ecchymosis, Hemosiderin Staining, Mottled, Pallor, Rubor, Erythema. Periwound temperature was noted as No Abnormality. The periwound has tenderness on palpation. Assessment Active Problems ICD-10 L97.522 - Non-pressure chronic ulcer of other part of left foot with fat layer exposed L97.511 - Non-pressure chronic ulcer of other part of right foot limited to breakdown of skin E11.621 - Type 2 diabetes mellitus with foot ulcer E11.51 - Type 2 diabetes mellitus with diabetic peripheral angiopathy without gangrene Procedures Wound #5 Wound #5 is a Diabetic Wound/Ulcer of the Lower Extremity located on the Left Calcaneus . There was a Skin/Subcutaneous Tissue Debridement ([id number]) debridement with total area of 1.17 sq cm performed by [name], MD. with the following instrument(s): Curette to remove Non-Viable tissue/material including Exudate, Fibrin/Slough, and Subcutaneous after achieving  pain control using Lidocaine 4% Topical Solution. A time out was conducted prior to the start of the procedure. A Minimum amount of bleeding was controlled with Pressure. The procedure was tolerated well with a pain level of 0 throughout and a pain level of 0 following the procedure. Post Debridement Measurements: 0.9cm length x 1.3cm width x 0.2cm depth; 0.184cm^3 volume. Post procedure Diagnosis Wound #5: Same as Pre-Procedure Wound #7 Wound #7 is a Diabetic Wound/Ulcer of the Lower Extremity located on the Left Metatarsal head first .  There was a Skin/Subcutaneous Tissue Debridement ([id number]) debridement with total area of 4 sq cm performed by [name], MD. with the following instrument(s): Curette to remove Non-Viable tissue/material including Exudate, Fibrin/Slough, and Subcutaneous after achieving pain control using Lidocaine 4% Topical Solution. A time out was conducted prior to the start of the procedure. A Minimum amount of bleeding was controlled with Pressure. The procedure was tolerated well with a pain level of 0 throughout and a pain level of 0 following the procedure. Post Debridement Measurements: 2cm length x 2cm width x 0.1cm depth; 0.314cm^3 volume. [name], [name] ([id number]) Post procedure Diagnosis Wound #7: Same as Pre-Procedure Plan Wound Cleansing: Wound #5 Left Calcaneus: Clean wound with Normal Saline. Wound #7 Left Metatarsal head first: Clean wound with Normal Saline. Anesthetic: Wound #5 Left Calcaneus: Topical Lidocaine 4% cream applied to wound bed prior to debridement Wound #7 Left Metatarsal head first: Topical Lidocaine 4% cream applied to wound bed prior to debridement Primary Wound Dressing: Wound #5 Left Calcaneus: Iodoflex Wound #7 Left Metatarsal head first: Iodoflex Secondary Dressing: Wound #5 Left Calcaneus: Gauze and Kerlix/Conform Wound #7 Left Metatarsal head first: Gauze and Kerlix/Conform Dressing Change  Frequency: Wound #5 Left Calcaneus: Change dressing every other day. Wound #7 Left Metatarsal head first: Change dressing every other day. Follow-up Appointments: Wound #5 Left Calcaneus: Return Appointment in 1 week. Wound #7 Left Metatarsal head first: Return Appointment in 1 week. Off-Loading: Wound #5 Left Calcaneus: Heel suspension boot to: - [employer] Wound #7 Left Metatarsal head first: Heel suspension boot to: - [place]: Wound #5 Left Calcaneus: [place] for [place] Nurse may visit PRN to address patient s wound care needs. FACE TO FACE ENCOUNTER: MEDICARE and MEDICAID PATIENTS: I certify that this patient is under my care and that I had a face-to-face encounter that meets the physician face-to-face encounter [name], [name]. ([id number]) requirements with this patient on this date. The encounter with the patient was in whole or in part for the following MEDICAL CONDITION: (primary reason for [place]) MEDICAL NECESSITY: I certify, that based on my findings, NURSING services are a medically necessary home health service. HOME BOUND STATUS: I certify that my clinical findings support that this patient is homebound (i.e., Due to illness or injury, pt requires aid of supportive devices such as crutches, cane, wheelchairs, walkers, the use of special transportation or the assistance of another person to leave their place of residence. There is a normal inability to leave the home and doing so requires considerable and taxing effort. Other absences are for medical reasons / religious services and are infrequent or of short duration when for other reasons). If current dressing causes regression in wound condition, may D/C ordered dressing product/s and apply Normal Saline Moist Dressing daily until next [place] / Other MD appointment. [place] of regression in wound condition at  [phone]. Please direct any NON-WOUND related issues/requests for orders to patient's Primary Care Physician Wound #7 Left Metatarsal head first: [place] for [place] Nurse may visit PRN to address patient s wound care needs. FACE TO FACE ENCOUNTER: MEDICARE and MEDICAID PATIENTS: I certify that this patient is under my care and that I had a face-to-face encounter that meets the physician face-to-face encounter requirements with this patient on this date. The encounter with the patient was in whole or in part for the following MEDICAL CONDITION: (primary  reason for Home Healthcare) MEDICAL NECESSITY: I certify, that based on my findings, NURSING services are a medically necessary home health service. HOME BOUND STATUS: I certify that my clinical findings support that this patient is homebound (i.e., Due to illness or injury, pt requires aid of supportive devices such as crutches, cane, wheelchairs, walkers, the use of special transportation or the assistance of another person to leave their place of residence. There is a normal inability to leave the home and doing so requires considerable and taxing effort. Other absences are for medical reasons / religious services and are infrequent or of short duration when for other reasons). If current dressing causes regression in wound condition, may D/C ordered dressing product/s and apply Normal Saline Moist Dressing daily until next [place] / Other MD appointment. [place] of regression in wound condition at [phone]. Please direct any NON-WOUND related issues/requests for orders to patient's Primary Care Physician #1 I change the dressing to Iodoflex. This is being changed at home by [facility] home health, Kerlix coban 2 he has an appointment with vascular surgery for probably a left lower extremity angiogram and possible stenting on 3/28 Electronic  Signature(s) Signed: 08/17/2015 4:45:15 PM By: [name] MD Entered By: [name] on 08/17/2015 16:18:35 [name], [name] ([id number]) -------------------------------------------------------------------------------- SuperBill Details Patient Name: Chris Bottoms K. Date of Service: 08/17/2015 Medical Record Patient Account Number: [id number] Number: Treating RN: Baruch Gouty, RN, BSN, Rita 1924-11-03 [phone] y.o. 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 E11.621 Type 2 diabetes mellitus with foot ulcer E11.51 Type 2 diabetes mellitus with diabetic peripheral angiopathy without gangrene Facility Procedures CPT4 Code Description: [id number] 11042 - DEB SUBQ TISSUE 20 SQ CM/< ICD-10 Description Diagnosis L97.522 Non-pressure chronic ulcer of other part of left foot Modifier: with fat lay Quantity: 1 er exposed Physician Procedures CPT4 Code Description: [id number] 11042 - WC PHYS SUBQ TISS 20 SQ CM ICD-10 Description Diagnosis L97.522 Non-pressure chronic ulcer of other part of left foot Modifier: with fat laye Quantity: 1 r exposed Electronic Signature(s) Signed: 08/17/2015 4:45:15 PM By: [name] MD Entered By: [name] on 08/17/2015 16:18:57

## 2015-08-18 NOTE — Progress Notes (Signed)
STEPEHN, ECKARD ([id number]) Visit Report for 08/17/2015 Arrival Information Details Patient Name: [name], [name]. Date of Service: 08/17/2015 3:30 PM Medical Record Number: [id number] Patient Account Number: [id number] Date of Birth/Sex: 1925/01/20 (82 y.o. Male) Treating RN: Clover Mealy, RN, BSN, [place] Sink Primary Care Physician: Vonita Moss Other Clinician: Referring Physician: Vonita Moss Treating Physician/Extender: Altamese [place] in Treatment: 4 Visit Information History Since Last Visit Added or deleted any medications: No Patient Arrived: Wheel Chair Any new allergies or adverse reactions: No Arrival Time: 15:42 Had a fall or experienced change in No activities of daily living that may affect Accompanied By: son risk of falls: Transfer Assistance: None Signs or symptoms of abuse/neglect since last No Patient Identification Verified: Yes visito Secondary Verification Process Yes Hospitalized since last visit: No Completed: Has Dressing in Place as Prescribed: Yes Patient Requires Transmission-Based No Pain Present Now: No Precautions: Patient Has Alerts: No Electronic Signature(s) Signed: 08/17/2015 4:57:40 PM By: Elpidio Eric BSN, RN Entered By: Elpidio Eric on 08/17/2015 15:43:17 Widrig, [name] ([id number]) -------------------------------------------------------------------------------- Encounter Discharge Information Details Patient Name: Chris Riding K. Date of Service: 08/17/2015 3:30 PM Medical Record Number: [id number] Patient Account Number: [id number] Date of Birth/Sex: 11-04-1924 (82 y.o. Male) Treating RN: Clover Mealy, RN, BSN, [place] Sink Primary Care Physician: Vonita Moss Other Clinician: Referring Physician: Vonita Moss Treating Physician/Extender: Altamese [place] in Treatment: 4 Encounter Discharge Information Items Discharge Pain Level: 0 Discharge Condition: Stable Ambulatory Status: Wheelchair Discharge Destination:  Home Transportation: Other Accompanied By: son Schedule Follow-up Appointment: No Medication Reconciliation completed and No provided to Patient/Care [name]: Clinical Summary of Care: Electronic Signature(s) Signed: 08/17/2015 4:57:40 PM By: Elpidio Eric BSN, RN Entered By: Elpidio Eric on 08/17/2015 16:08:13 [name] ([id number]) -------------------------------------------------------------------------------- Lower Extremity Assessment Details Patient Name: Chris Riding K. Date of Service: 08/17/2015 3:30 PM Medical Record Number: [id number] Patient Account Number: [id number] Date of Birth/Sex: 1925/05/25 (82 y.o. Male) Treating RN: Clover Mealy, RN, BSN, [place] Sink Primary Care Physician: Vonita Moss Other Clinician: Referring Physician: Vonita Moss Treating Physician/Extender: Altamese [place] in Treatment: 4 Vascular Assessment Pulses: Posterior Tibial Dorsalis Pedis Palpable: [Left:Yes] Extremity colors, hair growth, and conditions: Extremity Color: [Left:Normal] Hair Growth on Extremity: [Left:No] Temperature of Extremity: [Left:Warm] Capillary Refill: [Left:< 3 seconds] Electronic Signature(s) Signed: 08/17/2015 4:57:40 PM By: Elpidio Eric BSN, RN Entered By: Elpidio Eric on 08/17/2015 15:47:25 Stehlin, [name] ([id number]) -------------------------------------------------------------------------------- Multi Wound Chart Details Patient Name: Chris Riding K. Date of Service: 08/17/2015 3:30 PM Medical Record Number: [id number] Patient Account Number: [id number] Date of Birth/Sex: 23-Jan-1925 (82 y.o. 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] [6:the Lower Extremity] [7:the Lower Extremity] Comorbid History: [5:Hypertension, Type II Diabetes, History of pressure wounds, Osteoarthritis, Neuropathy Osteoarthritis, Neuropathy Osteoarthritis, Neuropathy] [6:Hypertension, Type II Diabetes, History of pressure wounds,] [7:Hypertension, Type II  Diabetes, History of pressure wounds,] Date Acquired: [5:07/11/2015] [6:07/11/2015] [7:07/11/2015] Weeks of Treatment: [5:4] [6:4] [7:4] Wound Status: [5:Open] [6:Open] [7:Open] Measurements L x W x D 0.9x1.3x0.2 [6:0x0x0] [7:2x2x0.1] (cm) Area (cm) : [5:0.919] [6:0] [7:3.142] Volume (  cm) : [5:0.184] [6:0] [7:0.314] % Reduction in Area: [5:89.40%] [6:100.00%] [7:-166.70%] % Reduction in Volume: 78.70% [6:100.00%] [7:-141.50%] Classification: [5:Unable to visualize wound Unable to visualize wound Unable to visualize wound bed] [6:bed] [7:bed] Exudate Amount: [5:Small] [6:None Present] [7:Medium] Exudate Type: [5:Serosanguineous] [6:N/A] [7:Serosanguineous] Exudate Color: [5:red, brown] [6:N/A] [7:red, brown] Wound Margin: [5:Distinct, outline attached Indistinct, nonvisible] [7:Indistinct, nonvisible] Granulation Amount: [5:Small (1-33%)] [6:None Present (0%)] [7:Small (1-33%)] Granulation Quality: [5:Red, Pink] [6:N/A] [7:N/A] Necrotic Amount: [5:Medium (34-66%)] [6:None Present (0%)] [7:Medium (34-66%)] Necrotic Tissue: [5:Eschar] [6:N/A] [7:Adherent Slough] Exposed Structures: [5:Fascia: No Fat: No] [6:Fascia: No Fat: No] [7:Fascia: No Fat: No] Tendon: No Tendon: No Tendon: No Muscle: No Muscle: No Muscle: No Joint: No Joint: No Joint:  No Bone: No Bone: No Bone: No Limited to Skin Limited to Skin Limited to Skin Breakdown Breakdown Breakdown Epithelialization: Small (1-33%) Large (67-100%) Small (1-33%) Periwound Skin Texture: Edema: Yes Callus: Yes Callus: Yes Excoriation: No Edema: No Edema: No Induration: No Excoriation: No Excoriation: No Callus: No Induration: No Induration: No Crepitus: No Crepitus: No Crepitus: No Fluctuance: No Fluctuance: No Fluctuance: No Friable: No Friable: No Friable: No Rash: No Rash: No Rash: No Scarring: No Scarring: No Scarring: No Periwound Skin Moist: Yes Dry/Scaly: Yes Moist: Yes Moisture: Dry/Scaly: Yes Maceration: No Dry/Scaly: Yes Maceration: No Moist: No Maceration: No Periwound Skin Color: Atrophie Blanche: No Atrophie Blanche: No Atrophie Blanche: No Cyanosis: No Cyanosis: No Cyanosis: No Ecchymosis: No Ecchymosis: No Ecchymosis: No Erythema: No Erythema: No Erythema: No Hemosiderin Staining: No Hemosiderin Staining: No Hemosiderin Staining: No Mottled: No Mottled: No Mottled: No Pallor: No Pallor: No Pallor: No Rubor: No Rubor: No Rubor: No Temperature: No Abnormality No Abnormality No Abnormality Tenderness on Yes No Yes Palpation: Wound Preparation: Ulcer Cleansing: Ulcer Cleansing: Ulcer Cleansing: Rinsed/Irrigated with Rinsed/Irrigated with Rinsed/Irrigated with Saline Saline Saline Topical Anesthetic Topical Anesthetic Topical Anesthetic Applied: Other: lidocaine Applied: None Applied: Other: lidocaine 4% 4% Treatment Notes Electronic Signature(s) Signed: 08/17/2015 4:57:40 PM By: Elpidio Eric BSN, RN Entered By: Elpidio Eric on 08/17/2015 16:04:05 [name] ([id number]) -------------------------------------------------------------------------------- Multi-Disciplinary Care Plan Details Patient Name: Chris Riding K. Date of Service: 08/17/2015 3:30 PM Medical Record Number: [id number] Patient Account Number:  [id number] Date of Birth/Sex: [birth year] (82 y.o. Male) Treating RN: Clover Mealy, RN, BSN, [place] Sink Primary Care Physician: Vonita Moss Other Clinician: Referring Physician: Vonita Moss Treating Physician/Extender: Altamese [place] in Treatment: 4 Active Inactive Abuse / Safety / Falls / Self Care Management Nursing Diagnoses: Impaired home maintenance Impaired physical mobility Knowledge deficit related to: safety; personal, health (wound), emergency Potential for falls Self care deficit: actual or potential Goals: Patient will remain injury free Date Initiated: 07/20/2015 Goal Status: Active Patient/caregiver will verbalize understanding of skin care regimen Date Initiated: 07/20/2015 Goal Status: Active Patient/caregiver will verbalize/demonstrate measure taken to improve self care Date Initiated: 07/20/2015 Goal Status: Active Patient/caregiver will verbalize/demonstrate measures taken to improve the patient's personal safety Date Initiated: 07/20/2015 Goal Status: Active Patient/caregiver will verbalize/demonstrate measures taken to prevent injury and/or falls Date Initiated: 07/20/2015 Goal Status: Active Patient/caregiver will verbalize/demonstrate understanding of what to do in case of emergency Date Initiated: 07/20/2015 Goal Status: Active Interventions: Assess fall risk on admission and as needed Assess: immobility, friction, shearing, incontinence upon admission and as needed Assess impairment of mobility on admission and as needed per policy Assess self care needs on admission and as needed Patient referred to community resources (specify in notes) NORI, POLAND ([id number]) Provide education on basic hygiene Provide education  on fall prevention Provide education on personal and home safety Provide education on safe transfers Treatment Activities: Education provided on Basic Hygiene : 08/10/2015 Notes: Orientation to the Wound Care Program Nursing  Diagnoses: Knowledge deficit related to the wound healing center program Goals: Patient/caregiver will verbalize understanding of the Wound Healing Center Program Date Initiated: 07/20/2015 Goal Status: Active Interventions: Provide education on orientation to the wound center Notes: Wound/Skin Impairment Nursing Diagnoses: Impaired tissue integrity Knowledge deficit related to ulceration/compromised skin integrity Goals: Patient/caregiver will verbalize understanding of skin care regimen Date Initiated: 07/20/2015 Goal Status: Active Ulcer/skin breakdown will have a volume reduction of 50% by week 8 Date Initiated: 07/20/2015 Goal Status: Active Ulcer/skin breakdown will have a volume reduction of 80% by week 12 Date Initiated: 07/20/2015 Goal Status: Active Ulcer/skin breakdown will heal within 14 weeks Date Initiated: 07/20/2015 Goal Status: Active Interventions: Assess patient/caregiver ability to obtain necessary supplies Mclelland, Jaree K. ([id number]) Assess patient/caregiver ability to perform ulcer/skin care regimen upon admission and as needed Assess ulceration(s) every visit Provide education on ulcer and skin care Treatment Activities: Patient referred to home care : 08/17/2015 Skin care regimen initiated : 08/17/2015 Topical wound management initiated : 08/17/2015 Notes: Electronic Signature(s) Signed: 08/17/2015 4:57:40 PM By: Elpidio Eric BSN, RN Entered By: Elpidio Eric on 08/17/2015 15:53:49 [name], [name] ([id number]) -------------------------------------------------------------------------------- Pain Assessment Details Patient Name: Chris Riding K. Date of Service: 08/17/2015 3:30 PM Medical Record Number: [id number] Patient Account Number: [id number] Date of Birth/Sex: Jan 31, 1925 (82 y.o. Male) Treating RN: Clover Mealy, RN, BSN, [place] Sink Primary Care Physician: Vonita Moss Other Clinician: Referring Physician: Vonita Moss Treating Physician/Extender: Maxwell Caul Weeks in Treatment: 4 Active Problems Location of Pain Severity and Description of Pain Patient Has Paino No Site Locations Pain Management and Medication Current Pain Management: Electronic Signature(s) Signed: 08/17/2015 4:57:40 PM By: Elpidio Eric BSN, RN Entered By: Elpidio Eric on 08/17/2015 15:43:25 Antonopoulos, [name] ([id number]) -------------------------------------------------------------------------------- Patient/Caregiver Education Details Patient Name: Chris Riding K. Date of Service: 08/17/2015 3:30 PM Medical Record Number: [id number] Patient Account Number: [id number] Date of Birth/Gender: Oct 23, 1924 (82 y.o. Male) Treating RN: Clover Mealy, RN, BSN, [place] Sink Primary Care Physician: Vonita Moss Other Clinician: Referring Physician: Vonita Moss Treating Physician/Extender: Altamese [place] in Treatment: 4 Education Assessment Education Provided To: Patient Education Topics Provided Basic Hygiene: Methods: Explain/Verbal Responses: State content correctly Safety: Methods: Explain/Verbal Responses: State content correctly Welcome To The [facility]: Methods: Explain/Verbal Responses: State content correctly Wound/Skin Impairment: Methods: Explain/Verbal Responses: State content correctly Electronic Signature(s) Signed: 08/17/2015 4:57:40 PM By: Elpidio Eric BSN, RN Entered By: Elpidio Eric on 08/17/2015 16:21:06 Shawley, [name] ([id number]) -------------------------------------------------------------------------------- Wound Assessment Details Patient Name: Chris Riding K. Date of Service: 08/17/2015 3:30 PM Medical Record Number: [id number] Patient Account Number: [id number] Date of Birth/Sex: 18-Jan-1925 (82 y.o. Male) Treating RN: Clover Mealy, RN, BSN, [place] Primary Care Physician: Vonita Moss Other Clinician: Referring Physician: Vonita Moss Treating Physician/Extender: Maxwell Caul Weeks in Treatment: 4 Wound Status Wound Number:  5 Primary Diabetic Wound/Ulcer of the Lower Etiology: Extremity Wound Location: Left Calcaneus Wound Open Wounding Event: Blister Status: Date Acquired: 07/11/2015 Comorbid Hypertension, Type II Diabetes, History Weeks Of Treatment: 4 History: of pressure wounds, Osteoarthritis, Clustered Wound: No Neuropathy Photos Photo Uploaded By: Elpidio Eric on 08/17/2015 17:09:45 Wound Measurements Length: (cm) 0.9 Width: (cm) 1.3 Depth: (cm) 0.2 Area: (cm) 0.919 Volume: (cm) 0.184 % Reduction in Area: 89.4% % Reduction in Volume: 78.7% Epithelialization: Small (1-33%) Tunneling: No Undermining: No Wound Description Classification:  Unable to visualize wound bed Wound Margin: Distinct, outline attached Exudate Amount: Small Exudate Type: Serosanguineous Sahni, Elija K. ([id number]) Foul Odor After Cleansing: No Exudate Color: red, brown Wound Bed Granulation Amount: Small (1-33%) Exposed Structure Granulation Quality: Red, Pink Fascia Exposed: No Necrotic Amount: Medium (34-66%) Fat Layer Exposed: No Necrotic Quality: Eschar Tendon Exposed: No Muscle Exposed: No Joint Exposed: No Bone Exposed: No Limited to Skin Breakdown Periwound Skin Texture Texture Color No Abnormalities Noted: No No Abnormalities Noted: No Callus: No Atrophie Blanche: No Crepitus: No Cyanosis: No Excoriation: No Ecchymosis: No Fluctuance: No Erythema: No Friable: No Hemosiderin Staining: No Induration: No Mottled: No Localized Edema: Yes Pallor: No Rash: No Rubor: No Scarring: No Temperature / Pain Moisture Temperature: No Abnormality No Abnormalities Noted: No Tenderness on Palpation: Yes Dry / Scaly: Yes Maceration: No Moist: Yes Wound Preparation Ulcer Cleansing: Rinsed/Irrigated with Saline Topical Anesthetic Applied: Other: lidocaine 4%, Treatment Notes Wound #5 (Left Calcaneus) 1. Cleansed with: Clean wound with Normal Saline 4. Dressing Applied: Iodoflex 5.  Secondary Dressing Applied Gauze and Kerlix/Conform 7. Secured with Secretary/administratorTape Electronic Signature(s) Signed: 08/17/2015 4:57:40 PM By: Elpidio EricAfful, Rita BSN, RN [name], [name] ([id number]) Entered By: Elpidio EricAfful, Rita on 08/17/2015 15:52:57 [name], [name] ([id number]) -------------------------------------------------------------------------------- Wound Assessment Details Patient Name: Chris RidingHENSLEY, Lucia K. Date of Service: 08/17/2015 3:30 PM Medical Record Number: [id number] Patient Account Number: [id number] Date of Birth/Sex: January 11, 1925 10(82 y.o. Male) Treating RN: Clover MealyAfful, RN, BSN, Rita Primary Care Physician: Vonita MossRISSMAN, MARK Other Clinician: Referring Physician: Vonita MossRISSMAN, MARK Treating Physician/Extender: Maxwell CaulOBSON, MICHAEL G Weeks in Treatment: 4 Wound Status Wound Number: 6 Primary Diabetic Wound/Ulcer of the Lower Etiology: Extremity Wound Location: Left Foot - Plantar Wound Open Wounding Event: Gradually Appeared Status: Date Acquired: 07/11/2015 Comorbid Hypertension, Type II Diabetes, History Weeks Of Treatment: 4 History: of pressure wounds, Osteoarthritis, Clustered Wound: No Neuropathy Photos Photo Uploaded By: Elpidio EricAfful, Rita on 08/17/2015 17:09:46 Wound Measurements Length: (cm) 0 % Reduction i Width: (cm) 0 % Reduction i Depth: (cm) 0 Epithelializa Area: (cm) 0 Tunneling: Volume: (cm) 0 Undermining: n Area: 100% n Volume: 100% tion: Large (67-100%) No No Wound Description Classification: Unable to visualize wound bed Wound Margin: Indistinct, nonvisible Exudate Amount: None Present Foul Odor After Cleansing: No Wound Bed Granulation Amount: None Present (0%) Exposed Structure Necrotic Amount: None Present (0%) Fascia Exposed: No Fat Layer Exposed: No Tendon Exposed: No Muscle Exposed: No Joint Exposed: No Yerian, [name] ([id number]) Bone Exposed: No Limited to Skin Breakdown Periwound Skin Texture Texture Color No Abnormalities Noted: No No Abnormalities  Noted: No Callus: Yes Atrophie Blanche: No Crepitus: No Cyanosis: No Excoriation: No Ecchymosis: No Fluctuance: No Erythema: No Friable: No Hemosiderin Staining: No Induration: No Mottled: No Localized Edema: No Pallor: No Rash: No Rubor: No Scarring: No Temperature / Pain Moisture Temperature: No Abnormality No Abnormalities Noted: No Dry / Scaly: Yes Maceration: No Moist: No Wound Preparation Ulcer Cleansing: Rinsed/Irrigated with Saline Topical Anesthetic Applied: None Electronic Signature(s) Signed: 08/17/2015 4:57:40 PM By: Elpidio EricAfful, Rita BSN, RN Entered By: Elpidio EricAfful, Rita on 08/17/2015 15:53:17 [name], [name] ([id number]) -------------------------------------------------------------------------------- Wound Assessment Details Patient Name: Chris RidingHENSLEY, [name] Date of Service: 08/17/2015 3:30 PM Medical Record Number: [id number] Patient Account Number: [id number] Date of Birth/Sex: January 11, 1925 [age] y.o. Male) Treating RN: Clover MealyAfful, RN, BSN,  [place] Primary Care Physician: Vonita MossRISSMAN, MARK Other Clinician: Referring Physician: Vonita MossRISSMAN, MARK Treating Physician/Extender: Maxwell CaulOBSON, MICHAEL G Weeks in Treatment: 4 Wound Status Wound Number: 7 Primary Diabetic Wound/Ulcer of the Lower Etiology: Extremity Wound Location: Left  Metatarsal head first Wound Open Wounding Event: Gradually Appeared Status: Date Acquired: 07/11/2015 Comorbid Hypertension, Type II Diabetes, History Weeks Of Treatment: 4 History: of pressure wounds, Osteoarthritis, Clustered Wound: No Neuropathy Photos Photo Uploaded By: Elpidio Eric on 08/17/2015 17:09:46 Wound Measurements Length: (cm) 2 Width: (cm) 2 Depth: (cm) 0.1 Area: (cm) 3.142 Volume: (cm) 0.314 % Reduction in Area: -166.7% % Reduction in Volume: -141.5% Epithelialization: Small (1-33%) Tunneling: No Undermining: No Wound Description Classification: Unable to visualize wound bed Wound Margin: Indistinct, nonvisible Exudate Amount:  Medium Exudate Type: Serosanguineous Exudate Color: red, brown Foul Odor After Cleansing: No Wound Bed Granulation Amount: Small (1-33%) Exposed Structure Necrotic Amount: Medium (34-66%) Fascia Exposed: No Necrotic Quality: Adherent Slough Fat Layer Exposed: No Tendon Exposed: No [name], [name] ([id number]) Muscle Exposed: No Joint Exposed: No Bone Exposed: No Limited to Skin Breakdown Periwound Skin Texture Texture Color No Abnormalities Noted: No No Abnormalities Noted: No Callus: Yes Atrophie Blanche: No Crepitus: No Cyanosis: No Excoriation: No Ecchymosis: No Fluctuance: No Erythema: No Friable: No Hemosiderin Staining: No Induration: No Mottled: No Localized Edema: No Pallor: No Rash: No Rubor: No Scarring: No Temperature / Pain Moisture Temperature: No Abnormality No Abnormalities Noted: No Tenderness on Palpation: Yes Dry / Scaly: Yes Maceration: No Moist: Yes Wound Preparation Ulcer Cleansing: Rinsed/Irrigated with Saline Topical Anesthetic Applied: Other: lidocaine 4%, Treatment Notes Wound #7 (Left Metatarsal head first) 1. Cleansed with: Clean wound with Normal Saline 4. Dressing Applied: Iodoflex 5. Secondary Dressing Applied Gauze and Kerlix/Conform 7. Secured with Secretary/administrator) Signed: 08/17/2015 4:57:40 PM By: Elpidio Eric BSN, RN Entered By: Elpidio Eric on 08/17/2015 15:53:43 [name] ([id number]) -------------------------------------------------------------------------------- Vitals Details Patient Name: Chris Riding K. Date of Service: 08/17/2015 3:30 PM Medical Record Number: [id number] Patient Account Number: [id number] Date of Birth/Sex: 28-Sep-1924 (82 y.o. Male) Treating RN: Clover Mealy, RN, BSN, Rita Primary Care Physician: Vonita Moss Other Clinician: Referring Physician: Vonita Moss Treating Physician/Extender: Altamese [place] in Treatment: 4 Vital Signs Time Taken: 15:43 Temperature  (F): 97.5 Height (in): 72 Pulse (bpm): 66 Weight (lbs): 195 Respiratory Rate (breaths/min): 18 Body Mass Index (BMI): 26.4 Blood Pressure (mmHg): 143/62 Reference Range: 80 - 120 mg / dl Electronic Signature(s) Signed: 08/17/2015 4:57:40 PM By: Elpidio Eric BSN, RN Entered By: Elpidio Eric on 08/17/2015 15:46:49

## 2015-08-23 ENCOUNTER — Encounter: Payer: PPO | Admitting: Internal Medicine

## 2015-08-23 DIAGNOSIS — E11621 Type 2 diabetes mellitus with foot ulcer: Secondary | ICD-10-CM | POA: Diagnosis not present

## 2015-08-24 ENCOUNTER — Encounter: Payer: PPO | Admitting: Family Medicine

## 2015-08-24 ENCOUNTER — Other Ambulatory Visit
Admission: RE | Admit: 2015-08-24 | Discharge: 2015-08-24 | Disposition: A | Discharge status: Home / Self Care | Payer: PPO | Source: Ambulatory Visit | Attending: Internal Medicine | Admitting: Internal Medicine

## 2015-08-24 DIAGNOSIS — S91302A Unspecified open wound, left foot, initial encounter: Secondary | ICD-10-CM | POA: Diagnosis present

## 2015-08-26 NOTE — Progress Notes (Signed)
URIYAH, RASKA ([id number]) Visit Report for 08/23/2015 Arrival Information Details Patient Name: [name], [name]. Date of Service: 08/23/2015 2:15 PM Medical Record Number: [id number] Patient Account Number: [id number] Date of Birth/Sex: [birth year] (82 y.o. Male) Treating RN: Clover Mealy, RN, BSN, [place] Sink Primary Care Physician: Vonita Moss Other Clinician: Referring Physician: Vonita Moss Treating Physician/Extender: Altamese [place] in Treatment: 4 Visit Information History Since Last Visit Added or deleted any medications: No Patient Arrived: Wheel Chair Any new allergies or adverse reactions: No Arrival Time: 14:28 Had a fall or experienced change in No activities of daily living that may affect Accompanied By: son risk of falls: Transfer Assistance: None Signs or symptoms of abuse/neglect since last No Patient Identification Verified: Yes visito Secondary Verification Process Yes Hospitalized since last visit: No Completed: Has Dressing in Place as Prescribed: Yes Patient Requires Transmission-Based No Pain Present Now: No Precautions: Patient Has Alerts: No Electronic Signature(s) Signed: 08/23/2015 5:19:34 PM By: Elpidio Eric BSN, RN Entered By: Elpidio Eric on 08/23/2015 14:28:55 [name], [name] ([id number]) -------------------------------------------------------------------------------- Encounter Discharge Information Details Patient Name: Chris Riding K. Date of Service: 08/23/2015 2:15 PM Medical Record Number: [id number] Patient Account Number: [id number] Date of Birth/Sex: 07/24/1924 (82 y.o. Male) Treating RN: Clover Mealy, RN, BSN, [place] Sink Primary Care Physician: Vonita Moss Other Clinician: Referring Physician: Vonita Moss Treating Physician/Extender: Altamese [place] in Treatment: 4 Encounter Discharge Information Items Discharge Pain Level: 0 Discharge Condition: Stable Ambulatory Status: Wheelchair Discharge Destination:  Home Transportation: Private Auto Accompanied ByShari Heritage Schedule Follow-up Appointment: No Medication Reconciliation completed and provided to Patient/Care No Ramaj Frangos: Provided on Clinical Summary of Care: 08/23/2015 Form Type Recipient Paper Patient [facility] Electronic Signature(s) Signed: 08/23/2015 5:19:34 PM By: Elpidio Eric BSN, RN Previous Signature: 08/23/2015 3:02:37 PM Version By: Gwenlyn Perking Entered By: Elpidio Eric on 08/23/2015 15:06:13 [name], [name] ([id number]) -------------------------------------------------------------------------------- Lower Extremity Assessment Details Patient Name: Chris Riding K. Date of Service: 08/23/2015 2:15 PM Medical Record Number: [id number] Patient Account Number: [id number] Date of Birth/Sex: [birth year] (82 y.o. Male) Treating RN: Clover Mealy, RN, BSN, [place] Sink Primary Care Physician: Vonita Moss Other Clinician: Referring Physician: Vonita Moss Treating Physician/Extender: Altamese [place] in Treatment: 4 Vascular Assessment Pulses: Posterior Tibial Dorsalis Pedis Palpable: [Left:Yes] Extremity colors, hair growth, and conditions: Extremity Color: [Left:Normal] Hair Growth on Extremity: [Left:No] Temperature of Extremity: [Left:Warm] Capillary Refill: [Left:< 3 seconds] Electronic Signature(s) Signed: 08/23/2015 5:19:34 PM By: Elpidio Eric BSN, RN Entered By: Elpidio Eric on 08/23/2015 14:29:33 [name] ([id number]) -------------------------------------------------------------------------------- Multi Wound Chart Details Patient Name: Chris Riding K. Date of Service: 08/23/2015 2:15 PM Medical Record Number: [id number] Patient Account Number: [id number] Date of Birth/Sex: Apr 03, 1925 (82 y.o. 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] [7:the Lower Extremity] Comorbid History: [5:Hypertension, Type II Diabetes, History of pressure wounds, Osteoarthritis, Neuropathy Osteoarthritis, Neuropathy] [7:Hypertension, Type II Diabetes, History of pressure wounds,] [N/A:N/A] Date Acquired: [5:07/11/2015] [7:07/11/2015] [N/A:N/A] Weeks of Treatment: [5:4] [7:4] [N/A:N/A] Wound Status: [5:Open] [7:Open] [N/A:N/A] Measurements L x W x D 1x1.3x0.2 [7:2.2x2.2x0.2] [N/A:N/A] (cm) Area (cm) : [5:1.021] [7:3.801] [N/A:N/A] Volume (cm) : [  5:0.204] [7:0.76] [N/A:N/A] % Reduction in Area: [5:88.20%] [7:-222.70%] [N/A:N/A] % Reduction in Volume: 76.40% [7:-484.60%] [N/A:N/A] Classification: [5:Grade 1] [7:Grade 1] [N/A:N/A] Exudate Amount: [5:Small] [7:Medium] [N/A:N/A] Exudate Type: [5:Serosanguineous] [7:Serosanguineous] [N/A:N/A] Exudate Color: [5:red, brown] [7:red, brown] [N/A:N/A] Wound Margin: [5:Distinct, outline attached Indistinct, nonvisible] [N/A:N/A] Granulation Amount: [5:Small (1-33%)] [7:Small (1-33%)] [N/A:N/A] Granulation Quality: [5:Red, Pink] [7:Pink] [N/A:N/A] Necrotic Amount: [5:Medium (34-66%)] [7:Large (67-100%)] [N/A:N/A] Necrotic Tissue: [5:Eschar, Adherent Slough Adherent Slough] [N/A:N/A] Exposed Structures: [5:Fascia: No Fat: No Tendon: No] [7:Fascia: No Fat: No Tendon: No] [N/A:N/A] Muscle: No Muscle: No Joint: No Joint: No Bone: No Bone: No Limited to Skin Limited to Skin Breakdown Breakdown Epithelialization: Small (1-33%) Small (1-33%) N/A Periwound Skin Texture: Edema: Yes Edema:  Yes N/A Excoriation: No Excoriation: No Induration: No Induration: No Callus: No Callus: No Crepitus: No Crepitus: No Fluctuance: No Fluctuance: No Friable: No Friable: No Rash: No Rash: No Scarring: No Scarring: No Periwound Skin Moist: Yes Maceration: Yes N/A Moisture: Dry/Scaly: Yes Moist: No Maceration: No Dry/Scaly: No Periwound Skin Color: Atrophie Blanche: No Atrophie Blanche: No N/A Cyanosis: No Cyanosis: No Ecchymosis: No Ecchymosis: No Erythema: No Erythema: No Hemosiderin Staining: No Hemosiderin Staining: No Mottled: No Mottled: No Pallor: No Pallor: No Rubor: No Rubor: No Temperature: No Abnormality No Abnormality N/A Tenderness on Yes Yes N/A Palpation: Wound Preparation: Ulcer Cleansing: Ulcer Cleansing: N/A Rinsed/Irrigated with Rinsed/Irrigated with Saline Saline Topical Anesthetic Topical Anesthetic Applied: Other: lidocaine Applied: Other: lidocaine 4% 4% Treatment Notes Electronic Signature(s) Signed: 08/23/2015 5:19:34 PM By: Elpidio Eric BSN, RN Entered By: Elpidio Eric on 08/23/2015 14:42:07 [name] ([id number]) -------------------------------------------------------------------------------- Multi-Disciplinary Care Plan Details Patient Name: Chris Riding K. Date of Service: 08/23/2015 2:15 PM Medical Record Number: [id number] Patient Account Number: [id number] Date of Birth/Sex: [birth year] (82 y.o. Male) Treating RN: Clover Mealy, RN, BSN, [place] Sink Primary Care Physician: Vonita Moss Other Clinician: Referring Physician: Vonita Moss Treating Physician/Extender: Altamese [employer] in Treatment: 4 Active Inactive Abuse / Safety / Falls / Self Care Management Nursing Diagnoses: Impaired home maintenance Impaired physical mobility Knowledge deficit related to: safety; personal, health (wound), emergency Potential for falls Self care deficit: actual or potential Goals: Patient will remain injury free Date Initiated:  07/20/2015 Goal Status: Active Patient/caregiver will verbalize understanding of skin care regimen Date Initiated: 07/20/2015 Goal Status: Active Patient/caregiver will verbalize/demonstrate measure taken to improve self care Date Initiated: 07/20/2015 Goal Status: Active Patient/caregiver will verbalize/demonstrate measures taken to improve the patient's personal safety Date Initiated: 07/20/2015 Goal Status: Active Patient/caregiver will verbalize/demonstrate measures taken to prevent injury and/or falls Date Initiated: 07/20/2015 Goal Status: Active Patient/caregiver will verbalize/demonstrate understanding of what to do in case of emergency Date Initiated: 07/20/2015 Goal Status: Active Interventions: Assess fall risk on admission and as needed Assess: immobility, friction, shearing, incontinence upon admission and as needed Assess impairment of mobility on admission and as needed per policy Assess self care needs on admission and as needed Patient referred to community resources (specify in notes) EZRAH, PANNING ([id number]) Provide education on basic hygiene Provide education on fall prevention Provide education on personal and home safety Provide education on safe transfers Treatment Activities: Education provided on Basic Hygiene : 08/10/2015 Notes: Orientation to the Wound Care Program Nursing Diagnoses: Knowledge deficit related to the wound healing center program Goals: Patient/caregiver will verbalize understanding of the Wound Healing Center Program Date Initiated: 07/20/2015 Goal Status: Active Interventions: Provide education on orientation to the wound center Notes: Wound/Skin Impairment Nursing Diagnoses: Impaired tissue integrity Knowledge deficit related to  ulceration/compromised skin integrity Goals: Patient/caregiver will verbalize understanding of skin care regimen Date Initiated: 07/20/2015 Goal Status: Active Ulcer/skin breakdown will have a volume  reduction of 50% by week 8 Date Initiated: 07/20/2015 Goal Status: Active Ulcer/skin breakdown will have a volume reduction of 80% by week 12 Date Initiated: 07/20/2015 Goal Status: Active Ulcer/skin breakdown will heal within 14 weeks Date Initiated: 07/20/2015 Goal Status: Active Interventions: Assess patient/caregiver ability to obtain necessary supplies [name], [name] ([id number]) Assess patient/caregiver ability to perform ulcer/skin care regimen upon admission and as needed Assess ulceration(s) every visit Provide education on ulcer and skin care Treatment Activities: Patient referred to home care : 08/23/2015 Skin care regimen initiated : 08/23/2015 Topical wound management initiated : 08/23/2015 Notes: Electronic Signature(s) Signed: 08/23/2015 5:19:34 PM By: Elpidio Eric BSN, RN Entered By: Elpidio Eric on 08/23/2015 14:41:18 [name], [name] ([id number]) -------------------------------------------------------------------------------- Pain Assessment Details Patient Name: Chris Riding K. Date of Service: 08/23/2015 2:15 PM Medical Record Number: [id number] Patient Account Number: [id number] Date of Birth/Sex: [birth year] (82 y.o. Male) Treating RN: Clover Mealy, RN, BSN, [place] Sink Primary Care Physician: Vonita Moss Other Clinician: Referring Physician: Vonita Moss Treating Physician/Extender: Maxwell Caul Weeks in Treatment: 4 Active Problems Location of Pain Severity and Description of Pain Patient Has Paino No Site Locations Pain Management and Medication Current Pain Management: Electronic Signature(s) Signed: 08/23/2015 5:19:34 PM By: Elpidio Eric BSN, RN Entered By: Elpidio Eric on 08/23/2015 14:29:04 [name] ([id number]) -------------------------------------------------------------------------------- Patient/Caregiver Education Details Patient Name: Chris Riding K. Date of Service: 08/23/2015 2:15 PM Medical Record Number: [id number] Patient Account  Number: [id number] Date of Birth/Gender: Oct 05, 1924 (82 y.o. Male) Treating RN: Clover Mealy, RN, BSN, [place] Sink Primary Care Physician: Vonita Moss Other Clinician: Referring Physician: Vonita Moss Treating Physician/Extender: Altamese [place] in Treatment: 4 Education Assessment Education Provided To: Patient Education Topics Provided Basic Hygiene: Methods: Explain/Verbal Responses: State content correctly Safety: Methods: Explain/Verbal Responses: State content correctly Welcome To The [facility]: Methods: Explain/Verbal Responses: State content correctly Wound/Skin Impairment: Methods: Explain/Verbal Responses: State content correctly Electronic Signature(s) Signed: 08/23/2015 5:19:34 PM By: Elpidio Eric BSN, RN Entered By: Elpidio Eric on 08/23/2015 15:06:35 [name], [name] ([id number]) -------------------------------------------------------------------------------- Wound Assessment Details Patient Name: Chris Riding K. Date of Service: 08/23/2015 2:15 PM Medical Record Number: [id number] Patient Account Number: [id number] Date of Birth/Sex: 1925/02/11 (82 y.o. Male) Treating RN: Clover Mealy, RN, BSN, [name] Primary Care Physician: Vonita Moss Other Clinician: Referring Physician: Vonita Moss Treating Physician/Extender: Maxwell Caul Weeks in Treatment: 4 Wound Status Wound Number: 5 Primary Diabetic Wound/Ulcer of the Lower Etiology: Extremity Wound Location: Left Calcaneus Wound Open Wounding Event: Blister Status: Date Acquired: 07/11/2015 Comorbid Hypertension, Type II Diabetes, History Weeks Of Treatment: 4 History: of pressure wounds, Osteoarthritis, Clustered Wound: No Neuropathy Photos Photo Uploaded By: Elpidio Eric on 08/23/2015 17:17:36 Wound Measurements Length: (cm) 1 Width: (cm) 1.3 Depth: (cm) 0.2 Area: (cm) 1.021 Volume: (cm) 0.204 % Reduction in Area: 88.2% % Reduction in Volume: 76.4% Epithelialization: Small  (1-33%) Tunneling: No Undermining: No Wound Description Classification: Grade 1 Wound Margin: Distinct, outline attached Exudate Amount: Small Exudate Type: Serosanguineous [name], [name] ([id number]) Foul Odor After Cleansing: No Exudate Color: red, brown Wound Bed Granulation Amount: Small (1-33%) Exposed Structure Granulation Quality: Red, Pink Fascia Exposed: No Necrotic Amount: Medium (34-66%) Fat Layer Exposed: No Necrotic Quality: Eschar, Adherent Slough Tendon Exposed: No Muscle Exposed: No Joint Exposed: No Bone Exposed: No Limited to Skin Breakdown Periwound Skin Texture Texture Color No Abnormalities Noted: No No  Abnormalities Noted: No Callus: No Atrophie Blanche: No Crepitus: No Cyanosis: No Excoriation: No Ecchymosis: No Fluctuance: No Erythema: No Friable: No Hemosiderin Staining: No Induration: No Mottled: No Localized Edema: Yes Pallor: No Rash: No Rubor: No Scarring: No Temperature / Pain Moisture Temperature: No Abnormality No Abnormalities Noted: No Tenderness on Palpation: Yes Dry / Scaly: Yes Maceration: No Moist: Yes Wound Preparation Ulcer Cleansing: Rinsed/Irrigated with Saline Topical Anesthetic Applied: Other: lidocaine 4%, Treatment Notes Wound #5 (Left Calcaneus) 1. Cleansed with: Clean wound with Normal Saline 4. Dressing Applied: Iodoflex 5. Secondary Dressing Applied Gauze and Kerlix/Conform 7. Secured with Secretary/administratorTape Electronic Signature(s) Signed: 08/23/2015 5:19:34 PM By: Elpidio EricAfful, [name] BSN, RN Henry RusselHENSLEY, [name] ([id number]) Entered By: Elpidio EricAfful, [name] on 08/23/2015 14:39:02 Henry RusselHENSLEY, [name] ([id number]) -------------------------------------------------------------------------------- Wound Assessment Details Patient Name: Chris RidingHENSLEY, Cree K. Date of Service: 08/23/2015 2:15 PM Medical Record Number: [id number] Patient Account Number: [id number] Date of Birth/Sex: 26-Apr-1925 52(82 y.o. Male) Treating RN: Clover MealyAfful, RN, BSN,  [name] Primary Care Physician: Vonita MossRISSMAN, MARK Other Clinician: Referring Physician: Vonita MossRISSMAN, MARK Treating Physician/Extender: Altamese CarolinaOBSON, MICHAEL G Weeks in Treatment: 4 Wound Status Wound Number: 6R Primary Diabetic Wound/Ulcer of the Lower Etiology: Extremity Wound Location: Left Foot - Plantar Wound Open Wounding Event: Gradually Appeared Status: Date Acquired: 07/12/2015 Comorbid Hypertension, Type II Diabetes, History Weeks Of Treatment: 4 History: of pressure wounds, Osteoarthritis, Clustered Wound: No Neuropathy Photos Photo Uploaded By: Elpidio EricAfful, [name] on 08/23/2015 17:17:36 Wound Measurements Length: (cm) 1.3 Width: (cm) 2 Depth: (cm) 0.2 Area: (cm) 2.042 Volume: (cm) 0.408 % Reduction in Area: 35% % Reduction in Volume: -29.9% Epithelialization: Large (67-100%) Tunneling: No Undermining: No Wound Description Classification: Grade 1 Wound Margin: Indistinct, nonvisible Exudate Amount: Large Exudate Type: Serosanguineous Exudate Color: red, brown Foul Odor After Cleansing: No Wound Bed Granulation Amount: Medium (34-66%) Exposed Structure Granulation Quality: Pink Fascia Exposed: No Necrotic Amount: Medium (34-66%) Fat Layer Exposed: No Necrotic Quality: Adherent Slough Tendon Exposed: No Cermak, Zaion K. ([id number]) Muscle Exposed: No Joint Exposed: No Bone Exposed: No Limited to Skin Breakdown Periwound Skin Texture Texture Color No Abnormalities Noted: No No Abnormalities Noted: No Callus: Yes Atrophie Blanche: No Crepitus: No Cyanosis: No Excoriation: No Ecchymosis: No Fluctuance: No Erythema: No Friable: No Hemosiderin Staining: No Induration: No Mottled: No Localized Edema: Yes Pallor: No Rash: No Rubor: No Scarring: No Temperature / Pain Moisture Temperature: No Abnormality No Abnormalities Noted: No Dry / Scaly: Yes Maceration: No Moist: Yes Wound Preparation Ulcer Cleansing: Rinsed/Irrigated with Saline Topical Anesthetic  Applied: None Treatment Notes Wound #6R (Left, Plantar Foot) 1. Cleansed with: Clean wound with Normal Saline 4. Dressing Applied: Iodoflex 5. Secondary Dressing Applied Gauze and Kerlix/Conform 7. Secured with Secretary/administratorTape Electronic Signature(s) Signed: 08/23/2015 5:19:34 PM By: Elpidio EricAfful, [name] BSN, RN Entered By: Elpidio EricAfful, [name] on 08/23/2015 14:49:25 Henry RusselHENSLEY, [name] ([id number]) -------------------------------------------------------------------------------- Wound Assessment Details Patient Name: Chris RidingHENSLEY, [name] Date of Service: 08/23/2015 2:15 PM Medical Record Number: [id number] Patient Account Number: [id number] Date of Birth/Sex: 26-Apr-1925 [age] y.o. Male) Treating RN: Clover MealyAfful, RN, BSN, [place] Sinkita Primary Care Physician: Vonita MossRISSMAN, MARK Other Clinician: Referring Physician: Vonita MossRISSMAN, MARK Treating Physician/Extender: Maxwell CaulOBSON, MICHAEL G Weeks in Treatment: 4 Wound Status Wound Number: 7 Primary Diabetic Wound/Ulcer of the Lower Etiology: Extremity Wound Location: Left Metatarsal head first Wound Open Wounding Event: Gradually Appeared Status: Date Acquired: 07/11/2015 Comorbid Hypertension, Type II Diabetes, History Weeks Of Treatment: 4 History: of pressure wounds, Osteoarthritis, Clustered Wound: No Neuropathy Photos Photo Uploaded By: Elpidio EricAfful, [name] on 08/23/2015 17:17:36 Wound Measurements Length: (cm) 2.2  Width: (cm) 2.2 Depth: (cm) 0.2 Area: (cm) 3.801 Volume: (cm) 0.76 % Reduction in Area: -222.7% % Reduction in Volume: -484.6% Epithelialization: Small (1-33%) Tunneling: No Undermining: No Wound Description Classification: Grade 1 Wound Margin: Indistinct, nonvisible Exudate Amount: Medium Exudate Type: Serosanguineous [name], [name] K. ([id number]) Foul Odor After Cleansing: No Exudate Color: red, brown Wound Bed Granulation Amount: Small (1-33%) Exposed Structure Granulation Quality: Pink Fascia Exposed: No Necrotic Amount: Large (67-100%) Fat Layer Exposed:  No Necrotic Quality: Adherent Slough Tendon Exposed: No Muscle Exposed: No Joint Exposed: No Bone Exposed: No Limited to Skin Breakdown Periwound Skin Texture Texture Color No Abnormalities Noted: No No Abnormalities Noted: No Callus: No Atrophie Blanche: No Crepitus: No Cyanosis: No Excoriation: No Ecchymosis: No Fluctuance: No Erythema: No Friable: No Hemosiderin Staining: No Induration: No Mottled: No Localized Edema: Yes Pallor: No Rash: No Rubor: No Scarring: No Temperature / Pain Moisture Temperature: No Abnormality No Abnormalities Noted: No Tenderness on Palpation: Yes Dry / Scaly: No Maceration: Yes Moist: No Wound Preparation Ulcer Cleansing: Rinsed/Irrigated with Saline Topical Anesthetic Applied: Other: lidocaine 4%, Treatment Notes Wound #7 (Left Metatarsal head first) 1. Cleansed with: Clean wound with Normal Saline 4. Dressing Applied: Iodoflex 5. Secondary Dressing Applied Gauze and Kerlix/Conform 7. Secured with Secretary/administrator) Signed: 08/23/2015 5:19:34 PM By: Elpidio Eric BSN, RN [name] ([id number]) Entered By: Elpidio Eric on 08/23/2015 14:38:24 [name] ([id number]) -------------------------------------------------------------------------------- Vitals Details Patient Name: Chris Riding K. Date of Service: 08/23/2015 2:15 PM Medical Record Number: [id number] Patient Account Number: [id number] Date of Birth/Sex: [birth year] (82 y.o. Male) Treating RN: Clover Mealy, RN, BSN, [name] Primary Care Physician: Vonita Moss Other Clinician: Referring Physician: Vonita Moss Treating Physician/Extender: Altamese [place] in Treatment: 4 Vital Signs Time Taken: 14:31 Temperature (F): 97.4 Height (in): 72 Pulse (bpm): 63 Weight (lbs): 195 Respiratory Rate (breaths/min): 17 Body Mass Index (BMI): 26.4 Blood Pressure (mmHg): 141/65 Reference Range: 80 - 120 mg / dl Electronic Signature(s) Signed:  08/23/2015 5:19:34 PM By: Elpidio Eric BSN, RN Entered By: Elpidio Eric on 08/23/2015 14:32:06

## 2015-08-26 NOTE — Progress Notes (Signed)
[name], [name] ([id number]) Visit Report for 08/23/2015 Chief Complaint Document Details Patient Name: [name], [name] Date of Service: 08/23/2015 2:15 PM Medical Record Patient Account Number: [id number] Number: Treating RN: Afful, RN, BSN, Rita 09/19/1924 (82 y.o. Other Clinician: Date of Birth/Sex: Male) Treating ROBSON, MICHAEL Primary Care Physician: CRISSMAN, MARK Physician/Extender: G Referring Physician: CRISSMAN, MARK Weeks in Treatment: 4 Information Obtained from: Patient Chief Complaint Has had swelling of both lower extremities with some ulceration on the right lower extremity. 07/20/15 patient returns today predominantly for a painful wound on the plantar aspect of his left foot of recent onset. Electronic Signature(s) Signed: 08/24/2015 4:33:06 PM By: Robson, Michael MD Entered By: Robson, Michael on 08/23/2015 15:02:54 Dykstra, Montgomery K. ([id number]) -------------------------------------------------------------------------------- Debridement Details Patient Name: [name], [name] Date of Service: 08/23/2015 2:15 PM Medical Record Patient Account Number: [id number] Number: Treating RN: Afful, RN, BSN, Rita 07/16/1924 (82 y.o. Other Clinician: Date of Birth/Sex: Male) Treating ROBSON, MICHAEL Primary Care Physician: CRISSMAN, MARK Physician/Extender: G Referring Physician: CRISSMAN, MARK Weeks in Treatment: 4 Debridement Performed for Wound #5 Left Calcaneus Assessment: Performed By: Physician ROBSON, MICHAEL G, MD Debridement: Debridement Pre-procedure Yes Verification/Time Out Taken: Start Time: 14:50 Pain Control: Lidocaine 4% Topical Solution Level: Skin/Subcutaneous Tissue Total Area Debrided (L x 1 (cm) x 1.3 (cm) = 1.3 (cm²) W): Tissue and other Non-Viable, Callus, Exudate, Fibrin/Slough, Subcutaneous material debrided: Instrument: Curette Bleeding: Minimum Hemostasis Achieved: Pressure End Time: 14:50 Procedural Pain:  0 Post Procedural Pain: 0 Response to Treatment: Procedure was tolerated well Post Debridement Measurements of Total Wound Length: (cm) 1 Width: (cm) 1.3 Depth: (cm) 0.2 Volume: (cm³) 0.204 Post Procedure Diagnosis Same as Pre-procedure Electronic Signature(s) Signed: 08/23/2015 5:19:34 PM By: Afful, Rita BSN, RN Signed: 08/24/2015 4:33:06 PM By: Robson, Michael MD Entered By: Robson, Michael on 08/23/2015 15:01:19 Seiter, Kody K. ([id number]) -------------------------------------------------------------------------------- Debridement Details Patient Name: [name], [name] Date of Service: 08/23/2015 2:15 PM Medical Record Patient Account Number: [id number] Number: Treating RN: Afful, RN, BSN, Rita 06/13/1924 (82 y.o. Other Clinician: Date of Birth/Sex: Male) Treating ROBSON, MICHAEL Primary Care Physician: CRISSMAN, MARK Physician/Extender: G Referring Physician: CRISSMAN, MARK Weeks in Treatment: 4 Debridement Performed for Wound #6R Left,Plantar Foot Assessment: Performed By: Physician ROBSON, MICHAEL G, MD Debridement: Debridement Pre-procedure Yes Verification/Time Out Taken: Start Time: 14:45 Pain Control: Lidocaine 4% Topical Solution Level: Skin/Subcutaneous Tissue Total Area Debrided (L x 1.3 (cm) x 2 (cm) = 2.6 (cm²) W): Tissue and other Non-Viable, Callus, Exudate, Fibrin/Slough, Subcutaneous material debrided: Instrument: Curette Bleeding: Minimum Hemostasis Achieved: Pressure End Time: 14:50 Procedural Pain: 0 Post Procedural Pain: 0 Response to Treatment: Procedure was tolerated well Post Debridement Measurements of Total Wound Length: (cm) 1.3 Width: (cm) 2 Depth: (cm) 0.2 Volume: (cm³) 0.408 Post Procedure Diagnosis Same as Pre-procedure Electronic Signature(s) Signed: 08/23/2015 5:19:34 PM By: Afful, Rita BSN, RN Signed: 08/24/2015 4:33:06 PM By: Robson, Michael MD Entered By: Robson, Michael on 08/23/2015 15:02:41 Skidmore, Tandre  K. ([id number]) -------------------------------------------------------------------------------- HPI Details Patient Name: [name], [name] Date of Service: 08/23/2015 2:15 PM Medical Record Patient Account Number: [id number] Number: Treating RN: Afful, RN, BSN, Rita 04/21/1925 (82 y.o. Other Clinician: Date of Birth/Sex: Male) Treating ROBSON, MICHAEL Primary Care Physician: CRISSMAN, MARK Physician/Extender: G Referring Physician: CRISSMAN, MARK Weeks in Treatment: 4 History of Present Illness Location: had swelling of the right lower extremity with some ulceration on the medial part of his calf and on the right fifth toe Quality: Patient reports No Pain. Severity: Patient   states wound (s) are getting better. Duration: Patient has had the wound for < 2 weeks prior to presenting for treatment Context: The wound appeared gradually over time. nail on his right fifth toe came off loose and he pried it Modifying Factors: Other treatment(s) tried include:vascular workup where he is going to be seeing Dr. Dew on Monday Associated Signs and Symptoms: Patient reports presence of swelling HPI Description: Very pleasant 82-year-old with a past medical history significant for type 2 diabetes. He underwent left knee surgery in February 2015. He had an angioplasty of his R posterior tibial on 10/30/13. Vascular workup done in April of this year showed an ABI on the right to be noncompressible and the left to be 0.86. About a week ago he noticed some swelling on the right lower extremity and then an ulceration on the medial part of his calf. He also noted that the right fifth nail bed had a loose nail and he pried it open and now has an open wound. He has no evidence of cellulitis. he was seen by the nurse practitioner at his PCPs office and put on Bactroban ointment and doxycycline for 10 days. 01/21/2015 -- he was seen by Dr. Schneir and though we do not have the note, we know he had  used an Unna's boot and asked him to come to see as again for a compression bandage. The patient has no fresh complaints. 07/20/15; the patient returns today predominantly for her wounds on his left plantar foot. He was seen here in the summer last on 8/19 for wounds on his right medial calf and right fifth nail bed. I don't know that he actually returned to be discharged, he is wearing compression on bilateral legs. He has known peripheral arterial disease in the setting of type 2 diabetes having an angioplasty on the right posterior tibial artery on 10/30/13. His ABI calculated in the clinic today was 0.97 on the right. I don't think the left could be done. The patient tells me that roughly a week ago he started to develop pain in the plantar aspect of his left foot. I think his caregiver noticed the wounds and he is here for our review events. He is minimally ambulatory, spends most of his time in a wheelchair. I am not certain of the issue here. 07/27/15; the patient is here for 3 wounds. The first over his left first met head, second over the left medial heel and the third over the left fourth metatarsal head. Culture from the fourth metatarsal head last week showed Klebsiella. This would not be sensitive to the doxycycline possibly I have therefore changed him to Keflex to which the Klebsiella should be sensitive 08/10/15; patient returns today. He was not seen last week. He does have home health. The area over the left fourth metatarsal head is almost completely closed the area over the medial aspect of his left first metatarsal head and the left medial heel still required debridement Emminger, Tyson K. ([id number]) 08/17/15 the area over the fourth metatarsal head has resolved. The area on his left first metatarsal head lateral aspect and the left medial heel still require surgical debridement although the surface is beginning to look a bit better. Is followed with vascular surgery and is  apparently being planned for an angiogram and possible stent on 3/28 08/23/15 the patient complained of pain over his fourth metatarsal head, the previously closed wound. Physical exam did not really show a source he had a small amount of   callus which I lightly removed to expose a large amount of purulent drainage. The area was then fully debridement the area on the first metatarsal head lateral aspect and left medial heel appear improved [Iodosorb] Electronic Signature(s) Signed: 08/24/2015 4:33:06 PM By: Robson, Michael MD Entered By: Robson, Michael on 08/23/2015 15:03:59 Hollander, Tyeler K. ([id number]) -------------------------------------------------------------------------------- Physical Exam Details Patient Name: Liddell, Kagan K. Date of Service: 08/23/2015 2:15 PM Medical Record Patient Account Number: [id number] Number: Treating RN: Afful, RN, BSN, Rita 11/21/1924 (82 y.o. Other Clinician: Date of Birth/Sex: Male) Treating ROBSON, MICHAEL Primary Care Physician: CRISSMAN, MARK Physician/Extender: G Referring Physician: CRISSMAN, MARK Weeks in Treatment: 4 Notes Wound exam; the new area/reopened area over the left 4th plantar metatarsal head was debridement it into a full extent of an open wound. A culture of the purulent drainage was obtained. Surgical debridement so of the left fifth metatarsal head and the lateral aspect of his heel were also done. Electronic Signature(s) Signed: 08/24/2015 4:33:06 PM By: Robson, Michael MD Entered By: Robson, Michael on 08/23/2015 15:06:15 Barabas, Yariel K. ([id number]) -------------------------------------------------------------------------------- Physician Orders Details Patient Name: Bondy, Keyshon K. Date of Service: 08/23/2015 2:15 PM Medical Record Patient Account Number: [id number] Number: Treating RN: Afful, RN, BSN, Rita 10/08/1924 (82 y.o. Other Clinician: Date of Birth/Sex: Male) Treating ROBSON,  MICHAEL Primary Care Physician: CRISSMAN, MARK Physician/Extender: G Referring Physician: CRISSMAN, MARK Weeks in Treatment: 4 Verbal / Phone Orders: Yes Clinician: Afful, RN, BSN, Rita Read Back and Verified: Yes Diagnosis Coding Wound Cleansing Wound #5 Left Calcaneus o Cleanse wound with mild soap and water Wound #6R Left,Plantar Foot o Cleanse wound with mild soap and water Wound #7 Left Metatarsal head first o Cleanse wound with mild soap and water Anesthetic Wound #5 Left Calcaneus o Topical Lidocaine 4% cream applied to wound bed prior to debridement Wound #6R Left,Plantar Foot o Topical Lidocaine 4% cream applied to wound bed prior to debridement Wound #7 Left Metatarsal head first o Topical Lidocaine 4% cream applied to wound bed prior to debridement Primary Wound Dressing Wound #5 Left Calcaneus o Iodoflex Wound #6R Left,Plantar Foot o Iodoflex Wound #7 Left Metatarsal head first o Iodoflex Secondary Dressing Wound #5 Left Calcaneus o Gauze and Kerlix/Conform Wound #6R Left,Plantar Foot Yassin, Devine K. ([id number]) o Gauze and Kerlix/Conform Wound #7 Left Metatarsal head first o Gauze and Kerlix/Conform Dressing Change Frequency Wound #5 Left Calcaneus o Change dressing every day. Wound #6R Left,Plantar Foot o Change dressing every day. Wound #7 Left Metatarsal head first o Change dressing every day. Follow-up Appointments Wound #5 Left Calcaneus o Return Appointment in 1 week. Wound #6R Left,Plantar Foot o Return Appointment in 1 week. Wound #7 Left Metatarsal head first o Return Appointment in 1 week. Off-Loading Wound #5 Left Calcaneus o Turn and reposition every 2 hours Wound #6R Left,Plantar Foot o Turn and reposition every 2 hours Wound #7 Left Metatarsal head first o Turn and reposition every 2 hours Additional Orders / Instructions Wound #5 Left Calcaneus o Increase protein intake. o  Activity as tolerated Wound #6R Left,Plantar Foot o Increase protein intake. o Activity as tolerated Wound #7 Left Metatarsal head first o Increase protein intake. o Activity as tolerated Carandang, Paulanthony K. ([id number]) Home Health Wound #5 Left Calcaneus o Continue Home Health Visits o Home Health Nurse may visit PRN to address patientos wound care needs. o FACE TO FACE ENCOUNTER: MEDICARE and MEDICAID PATIENTS: I certify that this patient is under my care and that I had   a face-to-face encounter that meets the physician face-to-face encounter requirements with this patient on this date. The encounter with the patient was in whole or in part for the following MEDICAL CONDITION: (primary reason for Home Healthcare) MEDICAL NECESSITY: I certify, that based on my findings, NURSING services are a medically necessary home health service. HOME BOUND STATUS: I certify that my clinical findings support that this patient is homebound (i.e., Due to illness or injury, pt requires aid of supportive devices such as crutches, cane, wheelchairs, walkers, the use of special transportation or the assistance of another person to leave their place of residence. There is a normal inability to leave the home and doing so requires considerable and taxing effort. Other absences are for medical reasons / religious services and are infrequent or of short duration when for other reasons). o If current dressing causes regression in wound condition, may D/C ordered dressing product/s and apply Normal Saline Moist Dressing daily until next Wound Healing Center / Other MD appointment. Notify Wound Healing Center of regression in wound condition at [phone]. o Please direct any NON-WOUND related issues/requests for orders to patient's Primary Care Physician Wound #6R Left,Plantar Foot o Continue Home Health Visits o Home Health Nurse may visit PRN to address patientos wound care needs. o  FACE TO FACE ENCOUNTER: MEDICARE and MEDICAID PATIENTS: I certify that this patient is under my care and that I had a face-to-face encounter that meets the physician face-to-face encounter requirements with this patient on this date. The encounter with the patient was in whole or in part for the following MEDICAL CONDITION: (primary reason for Home Healthcare) MEDICAL NECESSITY: I certify, that based on my findings, NURSING services are a medically necessary home health service. HOME BOUND STATUS: I certify that my clinical findings support that this patient is homebound (i.e., Due to illness or injury, pt requires aid of supportive devices such as crutches, cane, wheelchairs, walkers, the use of special transportation or the assistance of another person to leave their place of residence. There is a normal inability to leave the home and doing so requires considerable and taxing effort. Other absences are for medical reasons / religious services and are infrequent or of short duration when for other reasons). o If current dressing causes regression in wound condition, may D/C ordered dressing product/s and apply Normal Saline Moist Dressing daily until next Wound Healing Center / Other MD appointment. Notify Wound Healing Center of regression in wound condition at [phone]. o Please direct any NON-WOUND related issues/requests for orders to patient's Primary Care Physician Wound #7 Left Metatarsal head first o Continue Home Health Visits o Home Health Nurse may visit PRN to address patientos wound care needs. o FACE TO FACE ENCOUNTER: MEDICARE and MEDICAID PATIENTS: I certify that this patient is under my care and that I had a face-to-face encounter that meets the physician face-to-face encounter requirements with this patient on this date. The encounter with the patient was in whole or in part for the following MEDICAL CONDITION: (primary reason for Home Healthcare) Soo,  Hale K. ([id number]) MEDICAL NECESSITY: I certify, that based on my findings, NURSING services are a medically necessary home health service. HOME BOUND STATUS: I certify that my clinical findings support that this patient is homebound (i.e., Due to illness or injury, pt requires aid of supportive devices such as crutches, cane, wheelchairs, walkers, the use of special transportation or the assistance of another person to leave their place of residence. There is a normal inability   to leave the home and doing so requires considerable and taxing effort. Other absences are for medical reasons / religious services and are infrequent or of short duration when for other reasons). o If current dressing causes regression in wound condition, may D/C ordered dressing product/s and apply Normal Saline Moist Dressing daily until next [facility] / Other MD appointment. [facility] of regression in wound condition at [phone]. o Please direct any NON-WOUND related issues/requests for orders to patient's Primary Care Physician Medications-please add to medication list. Wound #5 Left Calcaneus o P.O. Antibiotics - Keflex 555m by mouth TID x 7 days Wound #6R Left,Plantar Foot o P.O. Antibiotics - Keflex 5063mby mouth TID x 7 days Wound #7 Left Metatarsal head first o P.O. Antibiotics - Keflex 50064my mouth TID x 7 days Electronic Signature(s) Signed: 08/23/2015 5:19:34 PM By: AffRegan LemmingN, RN Signed: 08/24/2015 4:33:06 PM By: RobLinton Ham Entered By: AffRegan Lemming 08/23/2015 15:00:43 HENXXAVIER, [id number]------------------------------------------------------------------------------- Prescription 08/23/2015 Patient Name: HENTy Hiltsysician: ROBRicard Dillon Date of Birth: 8/104-Dec-1926I#: [id number]: M DEA#: [id number] #: [id number] #: [id number] Address: [place] [id number] [id number] No Known Drug Allergies Physician's Orders P.O. Antibiotics - Keflex 500m43m mouth TID x 7 days Signature(s): Date(s): Electronic Signature(s) Signed: 08/24/2015 4:33:06 PM By: RobsLinton HamEntered By: RobsLinton Ham03/21/2017 15:09:12 Landing, Payten K. ([id number]------------------------------------------------------------------------------  Problem List Details Patient Name: HENSThomasenia BottomsDate of Service: 08/23/2015 2:15 PM Medical Record Patient Account Number: [id number]: Treating RN: AffuBaruch Gouty, BSN, Rita 8/11Oct 22, 1926 [phone]. 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/20/2015 Yes layer exposed L97.511 Non-pressure chronic ulcer of other part of right foot 07/20/2015 Yes limited to breakdown of skin E11.621 Type 2 diabetes mellitus with foot ulcer 07/20/2015 Yes E11.51 Type 2 diabetes mellitus with diabetic peripheral 07/20/2015 Yes angiopathy without gangrene Inactive Problems Resolved Problems Electronic Signature(s) Signed: 08/24/2015 4:33:06 PM By: RobsLinton HamEntered By: RobsLinton Ham03/21/2017 15:01:00 Robins, ELMEJunie [id number]------------------------------------------------------------------------------ Progress Note Details Patient Name: HENSThomasenia BottomsDate of Service: 08/23/2015 2:15 PM Medical Record Patient Account Number: [id number]: Treating RN: AffuBaruch Gouty, BSN, Rita [birth year] [phone]. Other Clinician: Date of Birth/Sex: Male)  Treating Gaige Fussner Primary Care Physician: CRISGolden Popsician/Extender: G Referring Physician: CRISGolden Popks in Treatment: 4 Subjective Chief Complaint Information obtained from Patient Has had swelling of both lower extremities with some ulceration on the right lower extremity. 07/20/15 patient returns today predominantly for a painful wound on the plantar aspect of his left foot of recent onset. History of Present Illness (HPI) The following HPI elements were documented for the patient's wound: Location: had swelling of the right lower extremity with some ulceration on the medial part of his calf and on the right fifth toe Quality: Patient reports No Pain. Severity: Patient states wound (s) are getting better. Duration: Patient has had the wound for < 2 weeks prior to presenting for treatment Context: The wound appeared gradually over time. nail on his right fifth toe came off loose  and he pried it Modifying Factors: Other treatment(s) tried include:vascular workup where he is going to be seeing Dr. Dew on Monday Associated Signs and Symptoms: Patient reports presence of swelling Very pleasant 82-year-old with a past medical history significant for type 2 diabetes. He underwent left knee surgery in February 2015. He had an angioplasty of his R posterior tibial on 10/30/13. Vascular workup done in April of this year showed an ABI on the right to be noncompressible and the left to be 0.86. About a week ago he noticed some swelling on the right lower extremity and then an ulceration on the medial part of his calf. He also noted that the right fifth nail bed had a loose nail and he pried it open and now has an open wound. He has no evidence of cellulitis. he was seen by the nurse practitioner at his PCPs office and put on Bactroban ointment and doxycycline for 10 days. 01/21/2015 -- he was seen by Dr. Schneir and though we do not have the note, we know he had used  an Unna's boot and asked him to come to see as again for a compression bandage. The patient has no fresh complaints. 07/20/15; the patient returns today predominantly for her wounds on his left plantar foot. He was seen here in the summer last on 8/19 for wounds on his right medial calf and right fifth nail bed. I don't know that he actually returned to be discharged, he is wearing compression on bilateral legs. He has known peripheral arterial disease in the setting of type 2 diabetes having an angioplasty on the right posterior tibial artery on 10/30/13. His ABI calculated in the clinic today was 0.97 on the right. I don't think the left could be done. The patient tells me that roughly a week ago he started to develop pain in the plantar aspect of his left foot. I Mangel, Edyn K. ([id number]) think his caregiver noticed the wounds and he is here for our review events. He is minimally ambulatory, spends most of his time in a wheelchair. I am not certain of the issue here. 07/27/15; the patient is here for 3 wounds. The first over his left first met head, second over the left medial heel and the third over the left fourth metatarsal head. Culture from the fourth metatarsal head last week showed Klebsiella. This would not be sensitive to the doxycycline possibly I have therefore changed him to Keflex to which the Klebsiella should be sensitive 08/10/15; patient returns today. He was not seen last week. He does have home health. The area over the left fourth metatarsal head is almost completely closed the area over the medial aspect of his left first metatarsal head and the left medial heel still required debridement 08/17/15 the area over the fourth metatarsal head has resolved. The area on his left first metatarsal head lateral aspect and the left medial heel still require surgical debridement although the surface is beginning to look a bit better. Is followed with vascular surgery and is apparently  being planned for an angiogram and possible stent on 3/28 08/23/15 the patient complained of pain over his fourth metatarsal head, the previously closed wound. Physical exam did not really show a source he had a small amount of callus which I lightly removed to expose a large amount of purulent drainage. The area was then fully debridement the area on the first metatarsal head lateral aspect and left medial heel appear improved [Iodosorb] Objective Constitutional Vitals Time   Taken: 2:31 PM, Height: 72 in, Weight: 195 lbs, BMI: 26.4, Temperature: 97.4 F, Pulse: 63 bpm, Respiratory Rate: 17 breaths/min, Blood Pressure: 141/65 mmHg. Integumentary (Hair, Skin) Wound #5 status is Open. Original cause of wound was Blister. The wound is located on the Left Calcaneus. The wound measures 1cm length x 1.3cm width x 0.2cm depth; 1.021cm^2 area and 0.204cm^3 volume. The wound is limited to skin breakdown. There is no tunneling or undermining noted. There is a small amount of serosanguineous drainage noted. The wound margin is distinct with the outline attached to the wound base. There is small (1-33%) red, pink granulation within the wound bed. There is a medium (34-66%) amount of necrotic tissue within the wound bed including Eschar and Adherent Slough. The periwound skin appearance exhibited: Localized Edema, Dry/Scaly, Moist. The periwound skin appearance did not exhibit: Callus, Crepitus, Excoriation, Fluctuance, Friable, Induration, Rash, Scarring, Maceration, Atrophie Blanche, Cyanosis, Ecchymosis, Hemosiderin Staining, Mottled, Pallor, Rubor, Erythema. Periwound temperature was noted as No Abnormality. The periwound has tenderness on palpation. Wound #6R status is Open. Original cause of wound was Gradually Appeared. The wound is located on the [place]. The wound measures 1.3cm length x 2cm width x 0.2cm depth; 2.042cm^2 area and 0.408cm^3 volume. The wound is limited to skin breakdown.  There is no tunneling or undermining noted. There is a large amount of serosanguineous drainage noted. The wound margin is indistinct and nonvisible. There is medium (34-66%) pink granulation within the wound bed. There is a medium (34-66%) amount of necrotic tissue within the wound bed including Adherent Slough. The periwound skin appearance exhibited: Callus, Localized Edema, Dry/Scaly, Moist. The periwound skin appearance did not exhibit: Crepitus, Gorley, Lundy K. ([id number]) Excoriation, Fluctuance, Friable, Induration, Rash, Scarring, Maceration, Atrophie Blanche, Cyanosis, Ecchymosis, Hemosiderin Staining, Mottled, Pallor, Rubor, Erythema. Periwound temperature was noted as No Abnormality. Wound #7 status is Open. Original cause of wound was Gradually Appeared. The wound is located on the Left Metatarsal head first. The wound measures 2.2cm length x 2.2cm width x 0.2cm depth; 3.801cm^2 area and 0.76cm^3 volume. The wound is limited to skin breakdown. There is no tunneling or undermining noted. There is a medium amount of serosanguineous drainage noted. The wound margin is indistinct and nonvisible. There is small (1-33%) pink granulation within the wound bed. There is a large (67-100%) amount of necrotic tissue within the wound bed including Adherent Slough. The periwound skin appearance exhibited: Localized Edema, Maceration. The periwound skin appearance did not exhibit: Callus, Crepitus, Excoriation, Fluctuance, Friable, Induration, Rash, Scarring, Dry/Scaly, Moist, Atrophie Blanche, Cyanosis, Ecchymosis, Hemosiderin Staining, Mottled, Pallor, Rubor, Erythema. Periwound temperature was noted as No Abnormality. The periwound has tenderness on palpation. Assessment Active Problems ICD-10 L97.522 - Non-pressure chronic ulcer of other part of left foot with fat layer exposed L97.511 - Non-pressure chronic ulcer of other part of right foot limited to breakdown of skin E11.621 - Type 2  diabetes mellitus with foot ulcer E11.51 - Type 2 diabetes mellitus with diabetic peripheral angiopathy without gangrene Procedures Wound #5 Wound #5 is a Diabetic Wound/Ulcer of the Lower Extremity located on the Left Calcaneus . There was a Skin/Subcutaneous Tissue Debridement ([id number]) debridement with total area of 1.3 sq cm performed by [name], MD. with the following instrument(s): Curette to remove Non-Viable tissue/material including Exudate, Fibrin/Slough, Callus, and Subcutaneous after achieving pain control using Lidocaine 4% Topical Solution. A time out was conducted prior to the start of the procedure. A Minimum amount of bleeding was controlled with Pressure. The  procedure was tolerated well with a pain level of 0 throughout and a pain level of 0 following the procedure. Post Debridement Measurements: 1cm length x 1.3cm width x 0.2cm depth; 0.204cm^3 volume. Post procedure Diagnosis Wound #5: Same as Pre-Procedure Wound #6R Wound #6R is a Diabetic Wound/Ulcer of the Lower Extremity located on the Left,Plantar Foot . There was a Skin/Subcutaneous Tissue Debridement ([id number]) debridement with total area of 2.6 sq cm Raisch, Eligha K. ([id number]) performed by [name], MD. with the following instrument(s): Curette to remove Non-Viable tissue/material including Exudate, Fibrin/Slough, Callus, and Subcutaneous after achieving pain control using Lidocaine 4% Topical Solution. A time out was conducted prior to the start of the procedure. A Minimum amount of bleeding was controlled with Pressure. The procedure was tolerated well with a pain level of 0 throughout and a pain level of 0 following the procedure. Post Debridement Measurements: 1.3cm length x 2cm width x 0.2cm depth; 0.408cm^3 volume. Post procedure Diagnosis Wound #6R: Same as Pre-Procedure Plan Wound Cleansing: Wound #5 Left Calcaneus: Cleanse wound with mild soap and water Wound #6R  Left,Plantar Foot: Cleanse wound with mild soap and water Wound #7 Left Metatarsal head first: Cleanse wound with mild soap and water Anesthetic: Wound #5 Left Calcaneus: Topical Lidocaine 4% cream applied to wound bed prior to debridement Wound #6R Left,Plantar Foot: Topical Lidocaine 4% cream applied to wound bed prior to debridement Wound #7 Left Metatarsal head first: Topical Lidocaine 4% cream applied to wound bed prior to debridement Primary Wound Dressing: Wound #5 Left Calcaneus: Iodoflex Wound #6R Left,Plantar Foot: Iodoflex Wound #7 Left Metatarsal head first: Iodoflex Secondary Dressing: Wound #5 Left Calcaneus: Gauze and Kerlix/Conform Wound #6R Left,Plantar Foot: Gauze and Kerlix/Conform Wound #7 Left Metatarsal head first: Gauze and Kerlix/Conform Dressing Change Frequency: Wound #5 Left Calcaneus: Change dressing every day. Wound #6R Left,Plantar Foot: Change dressing every day. Wound #7 Left Metatarsal head first: Change dressing every day. LENOARD, HELBERT ([id number]) Follow-up Appointments: Wound #5 Left Calcaneus: Return Appointment in 1 week. Wound #6R Left,Plantar Foot: Return Appointment in 1 week. Wound #7 Left Metatarsal head first: Return Appointment in 1 week. Off-Loading: Wound #5 Left Calcaneus: Turn and reposition every 2 hours Wound #6R Left,Plantar Foot: Turn and reposition every 2 hours Wound #7 Left Metatarsal head first: Turn and reposition every 2 hours Additional Orders / Instructions: Wound #5 Left Calcaneus: Increase protein intake. Activity as tolerated Wound #6R Left,Plantar Foot: Increase protein intake. Activity as tolerated Wound #7 Left Metatarsal head first: Increase protein intake. Activity as tolerated Home Health: Wound #5 Left Calcaneus: [place] Nurse may visit PRN to address patient s wound care needs. FACE TO FACE ENCOUNTER: MEDICARE and MEDICAID PATIENTS: I certify that  this patient is under my care and that I had a face-to-face encounter that meets the physician face-to-face encounter requirements with this patient on this date. The encounter with the patient was in whole or in part for the following MEDICAL CONDITION: (primary reason for [place]) MEDICAL NECESSITY: I certify, that based on my findings, NURSING services are a medically necessary home health service. HOME BOUND STATUS: I certify that my clinical findings support that this patient is homebound (i.e., Due to illness or injury, pt requires aid of supportive devices such as crutches, cane, wheelchairs, walkers, the use of special transportation or the assistance of another person to leave their place of residence. There is a normal inability to leave the home and doing so requires considerable  and taxing effort. Other absences are for medical reasons / religious services and are infrequent or of short duration when for other reasons). If current dressing causes regression in wound condition, may D/C ordered dressing product/s and apply Normal Saline Moist Dressing daily until next [place] / Other MD appointment. [place] of regression in wound condition at [phone]. Please direct any NON-WOUND related issues/requests for orders to patient's Primary Care Physician Wound #6R Left,Plantar Foot: [place] Nurse may visit PRN to address patient s wound care needs. FACE TO FACE ENCOUNTER: MEDICARE and MEDICAID PATIENTS: I certify that this patient is under my care and that I had a face-to-face encounter that meets the physician face-to-face encounter requirements with this patient on this date. The encounter with the patient was in whole or in part for the following MEDICAL CONDITION: (primary reason for [place]) MEDICAL NECESSITY: I certify, that based on my findings, NURSING services are a medically necessary home health  service. HOME BOUND STATUS: I certify that my clinical findings support that this patient is homebound (i.e., Due to ALEXY, HELDT. ([id number]) illness or injury, pt requires aid of supportive devices such as crutches, cane, wheelchairs, walkers, the use of special transportation or the assistance of another person to leave their place of residence. There is a normal inability to leave the home and doing so requires considerable and taxing effort. Other absences are for medical reasons / religious services and are infrequent or of short duration when for other reasons). If current dressing causes regression in wound condition, may D/C ordered dressing product/s and apply Normal Saline Moist Dressing daily until next [place] / Other MD appointment. [place] of regression in wound condition at [phone]. Please direct any NON-WOUND related issues/requests for orders to patient's Primary Care Physician Wound #7 Left Metatarsal head first: [place] Nurse may visit PRN to address patient s wound care needs. FACE TO FACE ENCOUNTER: MEDICARE and MEDICAID PATIENTS: I certify that this patient is under my care and that I had a face-to-face encounter that meets the physician face-to-face encounter requirements with this patient on this date. The encounter with the patient was in whole or in part for the following MEDICAL CONDITION: (primary reason for [place]) MEDICAL NECESSITY: I certify, that based on my findings, NURSING services are a medically necessary home health service. HOME BOUND STATUS: I certify that my clinical findings support that this patient is homebound (i.e., Due to illness or injury, pt requires aid of supportive devices such as crutches, cane, wheelchairs, walkers, the use of special transportation or the assistance of another person to leave their place of residence. There is a normal inability to leave the  home and doing so requires considerable and taxing effort. Other absences are for medical reasons / religious services and are infrequent or of short duration when for other reasons). If current dressing causes regression in wound condition, may D/C ordered dressing product/s and apply Normal Saline Moist Dressing daily until next [place] / Other MD appointment. [place] of regression in wound condition at [phone]. Please direct any NON-WOUND related issues/requests for orders to patient's Primary Care Physician Medications-please add to medication list.: Wound #5 Left Calcaneus: P.O. Antibiotics - Keflex 544m by mouth TID x 7 days Wound #6R Left,Plantar Foot: P.O. Antibiotics - Keflex 5072mby mouth TID x 7 days Wound #7 Left Metatarsal head first: P.O.  Antibiotics - Keflex 500mg by mouth TID x 7 days 1 continue Iodoflex to the left fifth metatarsal head, lateral aspect of left heel and the newly debridement it area over the fourth metatarsal head. #2 previous culture of the area over the fourth left metatarsal head showed Klebsiella I have restarted the Keflex which she received last time. 3 is going for his arteriogram and hopefully stent placement next week on Tuesday. We will try to see him on Wednesday. #4 Keflex resumed today at 500 3 times a day Chopin, Bently K. ([id number]) Electronic Signature(s) Signed: 08/24/2015 4:33:06 PM By: Robson, Michael MD Entered By: Robson, Michael on 08/23/2015 15:08:32 Glauser, Jaisen K. ([id number]) -------------------------------------------------------------------------------- SuperBill Details Patient Name: Sayas, Weylin K. Date of Service: 08/23/2015 Medical Record Patient Account Number: [id number] Number: Treating RN: Afful, RN, BSN, Rita 02/03/1925 (82 y.o. Other Clinician: Date of Birth/Sex: Male) Treating ROBSON, MICHAEL Primary Care Physician: CRISSMAN, MARK Physician/Extender:  G Referring Physician: CRISSMAN, MARK Weeks in Treatment: 4 Diagnosis Coding ICD-10 Codes Code Description L97.522 Non-pressure chronic ulcer of other part of left foot with fat layer exposed L97.511 Non-pressure chronic ulcer of other part of right foot limited to breakdown of skin E11.621 Type 2 diabetes mellitus with foot ulcer E11.51 Type 2 diabetes mellitus with diabetic peripheral angiopathy without gangrene Facility Procedures CPT4 Code Description: [id number] 11042 - DEB SUBQ TISSUE 20 SQ CM/< ICD-10 Description Diagnosis L97.522 Non-pressure chronic ulcer of other part of left foot Modifier: with fat lay Quantity: 1 er exposed Physician Procedures CPT4 Code Description: [id number] 11042 - WC PHYS SUBQ TISS 20 SQ CM ICD-10 Description Diagnosis L97.522 Non-pressure chronic ulcer of other part of left foot Modifier: with fat laye Quantity: 1 r exposed Electronic Signature(s) Signed: 08/24/2015 4:33:06 PM By: Robson, Michael MD Entered By: Robson, Michael on 08/23/2015 15:08:56 

## 2015-08-27 LAB — WOUND CULTURE

## 2015-08-29 ENCOUNTER — Other Ambulatory Visit
Admission: RE | Admit: 2015-08-29 | Discharge: 2015-08-29 | Disposition: A | Discharge status: Home / Self Care | Payer: PPO | Source: Ambulatory Visit | Attending: Vascular Surgery | Admitting: Vascular Surgery

## 2015-08-29 ENCOUNTER — Other Ambulatory Visit: Payer: Self-pay | Admitting: Vascular Surgery

## 2015-08-29 DIAGNOSIS — Z8249 Family history of ischemic heart disease and other diseases of the circulatory system: Secondary | ICD-10-CM | POA: Diagnosis not present

## 2015-08-29 DIAGNOSIS — I739 Peripheral vascular disease, unspecified: Secondary | ICD-10-CM | POA: Diagnosis not present

## 2015-08-29 DIAGNOSIS — I890 Lymphedema, not elsewhere classified: Secondary | ICD-10-CM | POA: Diagnosis not present

## 2015-08-29 DIAGNOSIS — Z9889 Other specified postprocedural states: Secondary | ICD-10-CM | POA: Diagnosis not present

## 2015-08-29 DIAGNOSIS — Z833 Family history of diabetes mellitus: Secondary | ICD-10-CM | POA: Diagnosis not present

## 2015-08-29 DIAGNOSIS — I70268 Atherosclerosis of native arteries of extremities with gangrene, other extremity: Secondary | ICD-10-CM | POA: Diagnosis not present

## 2015-08-29 DIAGNOSIS — L97529 Non-pressure chronic ulcer of other part of left foot with unspecified severity: Secondary | ICD-10-CM | POA: Diagnosis not present

## 2015-08-29 DIAGNOSIS — I70213 Atherosclerosis of native arteries of extremities with intermittent claudication, bilateral legs: Secondary | ICD-10-CM | POA: Diagnosis not present

## 2015-08-29 DIAGNOSIS — E1159 Type 2 diabetes mellitus with other circulatory complications: Secondary | ICD-10-CM | POA: Diagnosis not present

## 2015-08-29 DIAGNOSIS — Z7982 Long term (current) use of aspirin: Secondary | ICD-10-CM | POA: Diagnosis not present

## 2015-08-29 DIAGNOSIS — M7989 Other specified soft tissue disorders: Secondary | ICD-10-CM | POA: Diagnosis not present

## 2015-08-29 DIAGNOSIS — Z79899 Other long term (current) drug therapy: Secondary | ICD-10-CM | POA: Diagnosis not present

## 2015-08-29 LAB — BASIC METABOLIC PANEL
ANION GAP: 6 (ref 5–15)
BUN: 23 mg/dL — ABNORMAL HIGH (ref 6–20)
CHLORIDE: 106 mmol/L (ref 101–111)
CO2: 26 mmol/L (ref 22–32)
Calcium: 8.7 mg/dL — ABNORMAL LOW (ref 8.9–10.3)
Creatinine, Ser: 1.19 mg/dL (ref 0.61–1.24)
GFR calc non Af Amer: 53 mL/min — ABNORMAL LOW (ref >60)
Glucose, Bld: 202 mg/dL — ABNORMAL HIGH (ref 65–99)
POTASSIUM: 3.8 mmol/L (ref 3.5–5.1)
Sodium: 138 mmol/L (ref 135–145)

## 2015-08-30 ENCOUNTER — Ambulatory Visit
Admission: RE | Admit: 2015-08-30 | Discharge: 2015-08-30 | Disposition: A | Discharge status: Home / Self Care | Payer: PPO | Source: Ambulatory Visit | Attending: Vascular Surgery | Admitting: Vascular Surgery

## 2015-08-30 ENCOUNTER — Encounter
Admission: RE | Disposition: A | Discharge status: Home / Self Care | Payer: Self-pay | Source: Ambulatory Visit | Attending: Vascular Surgery

## 2015-08-30 DIAGNOSIS — Z9889 Other specified postprocedural states: Secondary | ICD-10-CM | POA: Insufficient documentation

## 2015-08-30 DIAGNOSIS — I890 Lymphedema, not elsewhere classified: Secondary | ICD-10-CM | POA: Insufficient documentation

## 2015-08-30 DIAGNOSIS — Z8249 Family history of ischemic heart disease and other diseases of the circulatory system: Secondary | ICD-10-CM | POA: Insufficient documentation

## 2015-08-30 DIAGNOSIS — I70268 Atherosclerosis of native arteries of extremities with gangrene, other extremity: Secondary | ICD-10-CM | POA: Insufficient documentation

## 2015-08-30 DIAGNOSIS — I739 Peripheral vascular disease, unspecified: Secondary | ICD-10-CM | POA: Insufficient documentation

## 2015-08-30 DIAGNOSIS — I70213 Atherosclerosis of native arteries of extremities with intermittent claudication, bilateral legs: Secondary | ICD-10-CM | POA: Insufficient documentation

## 2015-08-30 DIAGNOSIS — E1159 Type 2 diabetes mellitus with other circulatory complications: Secondary | ICD-10-CM | POA: Insufficient documentation

## 2015-08-30 DIAGNOSIS — L97529 Non-pressure chronic ulcer of other part of left foot with unspecified severity: Secondary | ICD-10-CM | POA: Insufficient documentation

## 2015-08-30 DIAGNOSIS — Z833 Family history of diabetes mellitus: Secondary | ICD-10-CM | POA: Insufficient documentation

## 2015-08-30 DIAGNOSIS — M7989 Other specified soft tissue disorders: Secondary | ICD-10-CM | POA: Insufficient documentation

## 2015-08-30 DIAGNOSIS — Z7982 Long term (current) use of aspirin: Secondary | ICD-10-CM | POA: Insufficient documentation

## 2015-08-30 DIAGNOSIS — Z79899 Other long term (current) drug therapy: Secondary | ICD-10-CM | POA: Insufficient documentation

## 2015-08-30 HISTORY — PX: PERIPHERAL VASCULAR CATHETERIZATION: SHX172C

## 2015-08-30 LAB — GLUCOSE, CAPILLARY: Glucose-Capillary: 140 mg/dL — ABNORMAL HIGH (ref 65–99)

## 2015-08-30 SURGERY — ABDOMINAL AORTOGRAM W/LOWER EXTREMITY
Wound class: Clean

## 2015-08-30 MED ORDER — FENTANYL CITRATE (PF) 100 MCG/2ML IJ SOLN
INTRAMUSCULAR | Status: AC
  Filled 2015-08-30: qty 2

## 2015-08-30 MED ORDER — HEPARIN (PORCINE) IN NACL 2-0.9 UNIT/ML-% IJ SOLN
INTRAMUSCULAR | Status: AC
  Filled 2015-08-30: qty 1000

## 2015-08-30 MED ORDER — ONDANSETRON HCL 4 MG/2ML IJ SOLN: 4.0000 mg | Freq: Four times a day (QID) | INTRAMUSCULAR | Status: DC | PRN

## 2015-08-30 MED ORDER — ASPIRIN EC 81 MG PO TBEC: 81.0000 mg | DELAYED_RELEASE_TABLET | Freq: Every day | ORAL | Status: DC

## 2015-08-30 MED ORDER — HEPARIN SODIUM (PORCINE) 1000 UNIT/ML IJ SOLN
INTRAMUSCULAR | Status: AC
  Filled 2015-08-30: qty 1

## 2015-08-30 MED ORDER — FAMOTIDINE 20 MG PO TABS: 40.0000 mg | ORAL_TABLET | ORAL | Status: DC | PRN

## 2015-08-30 MED ORDER — MIDAZOLAM HCL 2 MG/2ML IJ SOLN
INTRAMUSCULAR | Status: DC | PRN
  Administered 2015-08-30 (×2): 1 mg via INTRAVENOUS
  Administered 2015-08-30: 2 mg via INTRAVENOUS

## 2015-08-30 MED ORDER — HEPARIN SODIUM (PORCINE) 1000 UNIT/ML IJ SOLN
INTRAMUSCULAR | Status: DC | PRN
  Administered 2015-08-30: 5000 [IU] via INTRAVENOUS

## 2015-08-30 MED ORDER — METHYLPREDNISOLONE SODIUM SUCC 125 MG IJ SOLR: 125.0000 mg | INTRAMUSCULAR | Status: DC | PRN

## 2015-08-30 MED ORDER — MIDAZOLAM HCL 5 MG/5ML IJ SOLN
INTRAMUSCULAR | Status: AC
  Filled 2015-08-30: qty 5

## 2015-08-30 MED ORDER — IOHEXOL 300 MG/ML  SOLN
INTRAMUSCULAR | Status: DC | PRN
  Administered 2015-08-30: 65 mL via INTRA_ARTERIAL

## 2015-08-30 MED ORDER — LIDOCAINE HCL (PF) 1 % IJ SOLN
INTRAMUSCULAR | Status: DC | PRN
  Administered 2015-08-30: 5 mL via INTRADERMAL

## 2015-08-30 MED ORDER — HYDROMORPHONE HCL 1 MG/ML IJ SOLN: 1.0000 mg | Freq: Once | INTRAMUSCULAR | Status: DC

## 2015-08-30 MED ORDER — SODIUM CHLORIDE 0.9 % IV SOLN
INTRAVENOUS | Status: DC
  Administered 2015-08-30: 08:00:00 via INTRAVENOUS

## 2015-08-30 MED ORDER — LIDOCAINE-EPINEPHRINE (PF) 1 %-1:200000 IJ SOLN
INTRAMUSCULAR | Status: AC
  Filled 2015-08-30: qty 30

## 2015-08-30 MED ORDER — DEXTROSE 5 % IV SOLN
1.5000 g | INTRAVENOUS | Status: AC
  Administered 2015-08-30: 1.5 g via INTRAVENOUS

## 2015-08-30 MED ORDER — SODIUM BICARBONATE 8.4 % IV SOLN: INTRAVENOUS | Status: DC

## 2015-08-30 MED ORDER — FENTANYL CITRATE (PF) 100 MCG/2ML IJ SOLN
INTRAMUSCULAR | Status: DC | PRN
  Administered 2015-08-30 (×3): 50 ug via INTRAVENOUS

## 2015-08-30 MED ORDER — SODIUM BICARBONATE BOLUS VIA INFUSION: INTRAVENOUS | Status: DC

## 2015-08-30 SURGICAL SUPPLY — 16 items
CATH CXI SUPP ST 2.6FR 150CM (MICROCATHETER) ×5 IMPLANT
CATH PIG 70CM (CATHETERS) ×5 IMPLANT
CATH PTA VTRAK DILATE 2.5X150 (BALLOONS) ×5 IMPLANT
CATH RIM 65CM (CATHETERS) ×5 IMPLANT
DEVICE STARCLOSE SE CLOSURE (Vascular Products) ×5 IMPLANT
GLIDECATH ANGLED 4FR 120CM (CATHETERS) ×5 IMPLANT
GLIDEWIRE ANGLED SS 035X260CM (WIRE) ×5 IMPLANT
PACK ANGIOGRAPHY (CUSTOM PROCEDURE TRAY) ×5 IMPLANT
SET INTRO CAPELLA COAXIAL (SET/KITS/TRAYS/PACK) ×5 IMPLANT
SHEATH ANL2 6FRX45 HC (SHEATH) ×5 IMPLANT
SHEATH BRITE TIP 5FRX11 (SHEATH) ×5 IMPLANT
SYR MEDRAD MARK V 150ML (SYRINGE) ×5 IMPLANT
TUBING CONTRAST HIGH PRESS 72 (TUBING) ×5 IMPLANT
WIRE G V18X300CM (WIRE) ×5 IMPLANT
WIRE J 3MM .035X145CM (WIRE) ×5 IMPLANT
WIRE SPARTACORE .014X300CM (WIRE) ×5 IMPLANT

## 2015-08-30 NOTE — H&P (Signed)
[facility] History & Physical Update  The patient was interviewed and re-examined.  The patient's previous History and Physical has been reviewed and is unchanged.  There is no change in the plan of care. We plan to proceed with the scheduled procedure.  Roston Grunewald, Latina CraverGregory G, MD  08/30/2015, 8:52 AM

## 2015-08-30 NOTE — Op Note (Signed)
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  Abdominal aortogram  2.  Left lower extremity distal runoff third order catheter placement  3.  Percutaneous transluminal angioplasty left posterior tibial with a 2.5 mm angioscope balloon  4.  Star close right common femoral artery    Anesthesia: Conscious sedation was administered under my direct supervision. IV Versed plus fentanyl were utilized. Continuous ECG, pulse oximetry and blood pressure was monitored throughout the entire procedure. A total of 4 milligrams of Versed and 150 micrograms of fentanyl were utilized.  Conscious sedation was administered for a total of 70 minutes.  Sheath: 6 [place] Ansell right common femoral artery  Contrast: 65 cc   Fluoroscopy Time: 9.4 minutes  Indications:  Patient presents with ulcerative changes in gangrene of the toes on the left foot. Evaluation including physical examination noninvasive studies demonstrate significant atherosclerotic disease. He is therefore undergoing angiography with the hope for intervention for limb salvage. Risks and benefits of been reviewed all questions answered patient agrees to proceed.  Procedure:  Tanush Drees Hensleyis a 82 y.o. male who was identified and appropriate procedural time out was performed.  The patient was then placed supine on the table and prepped and draped in the usual sterile fashion.  Ultrasound was used to evaluate the right common femoral artery.  It was patent .  A digital ultrasound image was acquired.  Amicropuncture needle was used to access the right common femoral artery under direct ultrasound guidance and a permanent image  wassaved for the record.microwire was then advanced under fluoroscopic guidance followed by micro-sheath.  A 0.035 J wire was advanced without resistance and a 5Fr sheath was placed.    Pigtail catheter is then advanced up to the level of T12 and AP projection of the aorta is obtained. Pigtail catheter is then repositioned to above the bifurcation and an RAO projection of the pelvis is obtained. Using a rim catheter and a stiff angle Glidewire the aortic bifurcation was crossed and the catheter is advanced to the distal external iliac on the left. LAO projection of the common femoral and femoral bifurcation is then obtained. Glide wires reintroduced and negotiated into the SFA and the rim catheter is exchanged for the pigtail catheter. Distal runoff is then completed. Distal images are poor but repeated later in the case with direct hand injection through a long catheter.  5000 units of heparin was given and using accommodation of a long Kumpe catheter and a V-18 wire which is later exchanged for a [place] core wire the wire is negotiated down into the plantar branches. This is accomplished after a hand injection of contrast through the long Kumpe catheter is used to map the posterior tibial. Anterior tibial is occluded throughout its entire course. Dorsalis pedis does not reconstitute.  With the 0.014 wire in position a 2.5 x 15 cm angioscope balloon is advanced across the lesions inflation is performed slowly up to 12 atm for 1.5 minutes and then the balloon is slowly deflated. It is then repositioned and a second inflation is made. Follow-up imaging now demonstrates complete resolution of the multiple lesions within the posterior tibial and rapid flow of contrast filling the lateral plantar is in the pedal arch.  Sheath is then  pulled into the external iliac on the right oblique views obtained and a Star close device deployed without difficulty.  Findings:   Aortogram:  The abdominal aorta is diffusely  diseased but there are no hemodynamically significant lesions. Bilateral common and external iliac arteries are widely patent.  Right Lower Extremity:  Common femoral and the visualized portions of the profunda and SFA are patent  Left Lower Extremity:  The left common femoral profunda femoris and superficial femoral artery are diffusely diseased but there are no hemodynamically significant lesions identified. The tibioperoneal trunk is patent the posterior tibial demonstrates multiple subtotal occlusions throughout it's course. Peroneal is patent but quite small and does not contribute significantly across the ankle.  Anterior tibial is occluded from its origin through the dorsalis pedis and does not reconstitute.  Following angioplasty as described above there is now wide patency the posterior tibial with significant improvement in filling of the lateral plantar branches as well as the pedal arch.   Summary: Successful recanalization with in-line flow to the foot via the posterior tibial as described above.    Disposition: Patient was taken to the recovery room in stable condition having tolerated the procedure well.  [name], [name] 08/30/2015,12:52 PM

## 2015-08-30 NOTE — Discharge Instructions (Signed)

## 2015-08-31 ENCOUNTER — Encounter: Payer: PPO | Admitting: Internal Medicine

## 2015-08-31 DIAGNOSIS — E11621 Type 2 diabetes mellitus with foot ulcer: Secondary | ICD-10-CM | POA: Diagnosis not present

## 2015-09-01 ENCOUNTER — Other Ambulatory Visit: Payer: Self-pay | Admitting: Internal Medicine

## 2015-09-01 DIAGNOSIS — R609 Edema, unspecified: Secondary | ICD-10-CM

## 2015-09-01 DIAGNOSIS — M79672 Pain in left foot: Secondary | ICD-10-CM

## 2015-09-01 NOTE — Progress Notes (Signed)
[name], [name] ([id number]) Visit Report for 08/31/2015 Arrival Information Details Patient Name: [name], [name]. Date of Service: 08/31/2015 10:45 AM Medical Record Number: [id number] Patient Account Number: [id number] Date of Birth/Sex: Oct 10, 1924 (82 y.o. Male) Treating RN: Clover Mealy, RN, BSN, [place] Sink Primary Care Physician: Vonita Moss Other Clinician: Referring Physician: Vonita Moss Treating Physician/Extender: Altamese [place] in Treatment: 6 Visit Information History Since Last Visit Added or deleted any medications: No Patient Arrived: Wheel Chair Any new allergies or adverse reactions: No Arrival Time: 10:38 Had a fall or experienced change in No activities of daily living that may affect Accompanied By: son risk of falls: Transfer Assistance: None Signs or symptoms of abuse/neglect since last No Patient Identification Verified: Yes visito Secondary Verification Process Yes Hospitalized since last visit: No Completed: Has Dressing in Place as Prescribed: Yes Patient Requires Transmission-Based No Pain Present Now: No Precautions: Patient Has Alerts: No Electronic Signature(s) Signed: 08/31/2015 4:53:02 PM By: Elpidio Eric BSN, RN Entered By: Elpidio Eric on 08/31/2015 10:38:44 [name] ([id number]) -------------------------------------------------------------------------------- Encounter Discharge Information Details Patient Name: Chris Riding K. Date of Service: 08/31/2015 10:45 AM Medical Record Number: [id number] Patient Account Number: [id number] Date of Birth/Sex: 1924/11/26 (82 y.o. Male) Treating RN: Clover Mealy, RN, BSN, [place] Sink Primary Care Physician: Vonita Moss Other Clinician: Referring Physician: Vonita Moss Treating Physician/Extender: Altamese [place] in Treatment: 6 Encounter Discharge Information Items Discharge Pain Level: 0 Discharge Condition: Stable Ambulatory Status: Ambulatory Discharge Destination:  Home Transportation: Other Accompanied By: son Schedule Follow-up Appointment: No Medication Reconciliation completed and provided to Patient/Care No [name]: Provided on Clinical Summary of Care: 08/31/2015 Form Type Recipient Paper Patient [facility] Electronic Signature(s) Signed: 08/31/2015 11:10:05 AM By: Gwenlyn Perking Entered By: Gwenlyn Perking on 08/31/2015 11:10:05 [name], [name] ([id number]) -------------------------------------------------------------------------------- Lower Extremity Assessment Details Patient Name: Chris Riding K. Date of Service: 08/31/2015 10:45 AM Medical Record Number: [id number] Patient Account Number: [id number] Date of Birth/Sex: [birth year] (82 y.o. Male) Treating RN: Clover Mealy, RN, BSN, [place] Sink Primary Care Physician: Vonita Moss Other Clinician: Referring Physician: Vonita Moss Treating Physician/Extender: Maxwell Caul Weeks in Treatment: 6 Edema Assessment Assessed: [Left: No] [Right: No] Edema: [Left: Ye] [Right: s] Vascular Assessment Pulses: Posterior Tibial Dorsalis Pedis Palpable: [Left:Yes] Extremity colors, hair growth, and conditions: Extremity Color: [Left:Normal] Hair Growth on Extremity: [Left:No] Temperature of Extremity: [Left:Warm] Capillary Refill: [Left:< 3 seconds] Toe Nail Assessment Left: Right: Thick: Yes Discolored: Yes Deformed: No Improper Length and Hygiene: No Electronic Signature(s) Signed: 08/31/2015 4:53:02 PM By: Elpidio Eric BSN, RN Entered By: Elpidio Eric on 08/31/2015 10:39:16 [name], [name] ([id number]) -------------------------------------------------------------------------------- Multi Wound Chart Details Patient Name: Chris Riding K. Date of Service: 08/31/2015 10:45 AM Medical Record Number: [id number] Patient Account Number: [id number] Date of Birth/Sex: 09/10/1924 (82 y.o. 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] [6R:the Lower Extremity] [7:the Lower Extremity] Comorbid History: [5:Hypertension, Type II Diabetes, History of pressure wounds, Osteoarthritis, Neuropathy Osteoarthritis, Neuropathy Osteoarthritis, Neuropathy] [6R:Hypertension, Type II Diabetes, History of pressure wounds,] [7:Hypertension, Type II  Diabetes, History of  pressure wounds,] Date Acquired: [5:07/11/2015] [6R:07/12/2015] [7:07/11/2015] Weeks of Treatment: [5:6] [6R:6] [7:6] Wound Status: [5:Open] [6R:Open] [7:Open] Wound Recurrence: [5:No] [6R:Yes] [7:No] Measurements L x W x D 1x1x0.2 [6R:1x1.3x0.2] [7:2x2.5x0.2] (cm) Area (cm) : [5:0.785] [6R:1.021] [7:3.927] Volume (cm) : [5:0.157] [6R:0.204] [7:0.785] % Reduction in Area: [5:90.90%] [6R:67.50%] [7:-233.40%] % Reduction in Volume: 81.80% [6R:35.00%] [7:-503.80%] Classification: [5:Grade 1] [6R:Grade 1] [7:Grade 1] Exudate Amount: [5:Small] [6R:Large] [7:Medium] Exudate Type: [5:Serosanguineous] [6R:Serosanguineous] [7:Serosanguineous] Exudate Color: [5:red, brown] [6R:red, brown] [7:red, brown] Wound Margin: [5:Distinct, outline attached Indistinct, nonvisible] [7:Distinct, outline attached] Granulation Amount: [5:Small (1-33%)] [6R:Medium (34-66%)] [7:Small (1-33%)] Granulation Quality: [5:Red, Pink] [6R:Pink] [7:Pink] Necrotic Amount: [5:Medium (34-66%)] [6R:Medium (34-66%)]  [7:Large (67-100%)] Exposed Structures: [5:Fascia: No Fat: No Tendon: No] [6R:Fascia: No Fat: No Tendon: No] [7:Fascia: No Fat: No Tendon: No] Muscle: No Muscle: No Muscle: No Joint: No Joint: No Joint: No Bone: No Bone: No Bone: No Limited to Skin Limited to Skin Limited to Skin Breakdown Breakdown Breakdown Epithelialization: Small (1-33%) Medium (34-66%) Small (1-33%) Periwound Skin Texture: Edema: Yes Edema: Yes Edema: Yes Excoriation: No Callus: Yes Callus: Yes Induration: No Excoriation: No Excoriation: No Callus: No Induration: No Induration: No Crepitus: No Crepitus: No Crepitus: No Fluctuance: No Fluctuance: No Fluctuance: No Friable: No Friable: No Friable: No Rash: No Rash: No Rash: No Scarring: No Scarring: No Scarring: No Periwound Skin Moist: Yes Moist: Yes Maceration: Yes Moisture: Dry/Scaly: Yes Dry/Scaly: Yes Moist: No Maceration: No Maceration: No Dry/Scaly: No Periwound Skin Color: Atrophie Blanche: No Atrophie Blanche: No Atrophie Blanche: No Cyanosis: No Cyanosis: No Cyanosis: No Ecchymosis: No Ecchymosis: No Ecchymosis: No Erythema: No Erythema: No Erythema: No Hemosiderin Staining: No Hemosiderin Staining: No Hemosiderin Staining: No Mottled: No Mottled: No Mottled: No Pallor: No Pallor: No Pallor: No Rubor: No Rubor: No Rubor: No Temperature: No Abnormality No Abnormality No Abnormality Tenderness on Yes No Yes Palpation: Wound Preparation: Ulcer Cleansing: Ulcer Cleansing: Ulcer Cleansing: Rinsed/Irrigated with Rinsed/Irrigated with Rinsed/Irrigated with Saline Saline Saline Topical Anesthetic Topical Anesthetic Topical Anesthetic Applied: Other: lidocaine Applied: Other: lidocaine Applied: Other: lidocaine 4% 4% 4% Treatment Notes Electronic Signature(s) Signed: 08/31/2015 4:53:02 PM By: Elpidio Eric BSN, RN Entered By: Elpidio Eric on 08/31/2015 10:53:13 [name]  ([id number]) -------------------------------------------------------------------------------- Multi-Disciplinary Care Plan Details Patient Name: Chris Riding K. Date of Service: 08/31/2015 10:45 AM Medical Record Number: [id number] Patient Account Number: [id number] Date of Birth/Sex: 1925/05/24 (82 y.o. Male) Treating RN: Clover Mealy, RN, BSN, [place] Sink Primary Care Physician: Vonita Moss Other Clinician: Referring Physician: Vonita Moss Treating Physician/Extender: Altamese  in Treatment: 6 Active Inactive Abuse / Safety / Falls / Self Care Management Nursing Diagnoses: Impaired home maintenance Impaired physical mobility Knowledge deficit related to: safety; personal, health (wound), emergency Potential for falls Self care deficit: actual or potential Goals: Patient will remain injury free Date Initiated: 07/20/2015 Goal Status: Active Patient/caregiver will verbalize understanding of skin care regimen Date Initiated: 07/20/2015 Goal Status: Active Patient/caregiver will verbalize/demonstrate measure taken to improve self care Date Initiated: 07/20/2015 Goal Status: Active Patient/caregiver will verbalize/demonstrate measures taken to improve the patient's personal safety Date Initiated: 07/20/2015 Goal Status: Active Patient/caregiver will verbalize/demonstrate measures taken to prevent injury and/or falls Date Initiated: 07/20/2015 Goal Status: Active Patient/caregiver will verbalize/demonstrate understanding of what to do in case of emergency Date Initiated: 07/20/2015 Goal Status: Active Interventions: Assess fall risk on admission and as needed Assess: immobility, friction, shearing, incontinence upon admission and as needed Assess impairment of mobility on admission and as needed per policy Assess self  care needs on admission and as needed Patient referred to community resources (specify in notes) CIRO, TASHIRO. ([id number]) Provide education on basic  hygiene Provide education on fall prevention Provide education on personal and home safety Provide education on safe transfers Treatment Activities: Education provided on Basic Hygiene : 08/10/2015 Notes: Orientation to the Wound Care Program Nursing Diagnoses: Knowledge deficit related to the wound healing center program Goals: Patient/caregiver will verbalize understanding of the Wound Healing Center Program Date Initiated: 07/20/2015 Goal Status: Active Interventions: Provide education on orientation to the wound center Notes: Wound/Skin Impairment Nursing Diagnoses: Impaired tissue integrity Knowledge deficit related to ulceration/compromised skin integrity Goals: Patient/caregiver will verbalize understanding of skin care regimen Date Initiated: 07/20/2015 Goal Status: Active Ulcer/skin breakdown will have a volume reduction of 50% by week 8 Date Initiated: 07/20/2015 Goal Status: Active Ulcer/skin breakdown will have a volume reduction of 80% by week 12 Date Initiated: 07/20/2015 Goal Status: Active Ulcer/skin breakdown will heal within 14 weeks Date Initiated: 07/20/2015 Goal Status: Active Interventions: Assess patient/caregiver ability to obtain necessary supplies Plummer, Baron K. ([id number]) Assess patient/caregiver ability to perform ulcer/skin care regimen upon admission and as needed Assess ulceration(s) every visit Provide education on ulcer and skin care Treatment Activities: Patient referred to home care : 08/31/2015 Skin care regimen initiated : 08/31/2015 Topical wound management initiated : 08/31/2015 Notes: Electronic Signature(s) Signed: 08/31/2015 4:53:02 PM By: Elpidio Eric BSN, RN Entered By: Elpidio Eric on 08/31/2015 10:52:58 Codner, [name] ([id number]) -------------------------------------------------------------------------------- Pain Assessment Details Patient Name: Chris Riding K. Date of Service: 08/31/2015 10:45 AM Medical Record Number:  [id number] Patient Account Number: [id number] Date of Birth/Sex: 08-19-1924 (82 y.o. Male) Treating RN: Clover Mealy, RN, BSN, [place] Sink Primary Care Physician: Vonita Moss Other Clinician: Referring Physician: Vonita Moss Treating Physician/Extender: Maxwell Caul Weeks in Treatment: 6 Active Problems Location of Pain Severity and Description of Pain Patient Has Paino No Site Locations Pain Management and Medication Current Pain Management: Electronic Signature(s) Signed: 08/31/2015 4:53:02 PM By: Elpidio Eric BSN, RN Entered By: Elpidio Eric on 08/31/2015 10:38:50 [name] ([id number]) -------------------------------------------------------------------------------- Patient/Caregiver Education Details Patient Name: Chris Riding K. Date of Service: 08/31/2015 10:45 AM Medical Record Number: [id number] Patient Account Number: [id number] Date of Birth/Gender: 1925-05-23 (82 y.o. Male) Treating RN: Clover Mealy, RN, BSN, [place] Sink Primary Care Physician: Vonita Moss Other Clinician: Referring Physician: Vonita Moss Treating Physician/Extender: Altamese [place] in Treatment: 6 Education Assessment Education Provided To: Patient Education Topics Provided Basic Hygiene: Methods: Explain/Verbal Responses: State content correctly Safety: Methods: Explain/Verbal Responses: State content correctly Welcome To The [facility]: Methods: Explain/Verbal Responses: State content correctly Wound/Skin Impairment: Methods: Explain/Verbal Responses: State content correctly Electronic Signature(s) Signed: 08/31/2015 4:53:02 PM By: Elpidio Eric BSN, RN Entered By: Elpidio Eric on 08/31/2015 11:00:21 Manfredi, [name] ([id number]) -------------------------------------------------------------------------------- Wound Assessment Details Patient Name: Chris Riding K. Date of Service: 08/31/2015 10:45 AM Medical Record Number: [id number] Patient Account Number: [id number] Date of  Birth/Sex: 1924-06-30 (82 y.o. Male) Treating RN: Clover Mealy, RN, BSN, [place] Sink Primary Care Physician: Vonita Moss Other Clinician: Referring Physician: Vonita Moss Treating Physician/Extender: Maxwell Caul Weeks in Treatment: 6 Wound Status Wound Number: 5 Primary Diabetic Wound/Ulcer of the Lower Etiology: Extremity Wound Location: Left Calcaneus Wound Open Wounding Event: Blister Status: Date Acquired: 07/11/2015 Comorbid Hypertension, Type II Diabetes, History Weeks Of Treatment: 6 History: of pressure wounds, Osteoarthritis, Clustered Wound: No Neuropathy Photos Photo Uploaded By: Elpidio Eric on 08/31/2015 16:48:41 Wound Measurements Length: (cm) 1 Width: (cm) 1 Depth: (cm) 0.2  Area: (cm) 0.785 Volume: (cm) 0.157 % Reduction in Area: 90.9% % Reduction in Volume: 81.8% Epithelialization: Small (1-33%) Tunneling: No Undermining: No Wound Description Classification: Grade 1 Wound Margin: Distinct, outline attached Exudate Amount: Small Exudate Type: Serosanguineous Exudate Color: red, brown Foul Odor After Cleansing: No Wound Bed Granulation Amount: Small (1-33%) Exposed Structure Granulation Quality: Red, Pink Fascia Exposed: No Necrotic Amount: Medium (34-66%) Fat Layer Exposed: No Necrotic Quality: Adherent Slough Tendon Exposed: No [place], [name] ([id number]) Muscle Exposed: No Joint Exposed: No Bone Exposed: No Limited to Skin Breakdown Periwound Skin Texture Texture Color No Abnormalities Noted: No No Abnormalities Noted: No Callus: No Atrophie Blanche: No Crepitus: No Cyanosis: No Excoriation:[id number] No Ecchymosis: No Fluctuance: No Erythema: No Friable: No Hemosiderin Staining: No Induration: No Mottled: No Localized Edema: Yes Pallor: No Rash: No Rubor: No Scarring: No Temperature / Pain Moisture Temperature: No Abnormality No Abnormalities Noted: No Tenderness on Palpation: Yes Dry / Scaly: Yes Maceration: No Moist: Yes Wound  Preparation Ulcer Cleansing: Rinsed/Irrigated with Saline Topical Anesthetic Applied: Other: lidocaine 4%, Treatment Notes Wound #5 (Left Calcaneus) 1. Cleansed with: Clean wound with Normal Saline 4. Dressing Applied: Iodoflex 5. Secondary Dressing Applied Gauze and Kerlix/Conform 7. Secured with Secretary/administratorTape Electronic Signature(s) Signed: 08/31/2015 4:53:02 PM By: Elpidio EricAfful, Rita BSN, RN Entered By: Elpidio EricAfful, Rita on 08/31/2015 10:47:10 [name], [name] ([id number]) -------------------------------------------------------------------------------- Wound Assessment Details Patient Name: [name], [name] Date of Service: 08/31/2015 10:45 AM Medical Record Number: [id number] Patient Account Number: [id number] Date of Birth/Sex: 03-03-1925 40(82 y.o. Male) Treating RN: Clover MealyAfful, RN, BSN, Rita Primary Care Physician: Vonita MossRISSMAN, MARK Other Clinician: Referring Physician: Vonita MossRISSMAN, MARK Treating Physician/Extender: Altamese CarolinaOBSON, MICHAEL G Weeks in Treatment: 6 Wound Status Wound Number: 6R Primary Diabetic Wound/Ulcer of the Lower Etiology: Extremity Wound Location: Left Foot - Plantar Wound Open Wounding Event: Gradually Appeared Status: Date Acquired: 07/12/2015 Comorbid Hypertension, Type II Diabetes, History Weeks Of Treatment: 6 History: of pressure wounds, Osteoarthritis, Clustered Wound: No Neuropathy Photos Photo Uploaded By: Elpidio EricAfful, Rita on 08/31/2015 16:48:41 Wound Measurements Length: (cm) 1 Width: (cm) 1.3 Depth: (cm) 0.2 Area: (cm) 1.021 Volume: (cm) 0.204 % Reduction in Area: 67.5% % Reduction in Volume: 35% Epithelialization: Medium (34-66%) Tunneling: No Undermining: No Wound Description Classification: Grade 1 Wound Margin: Indistinct, nonvisible Exudate Amount: Large Exudate Type: Serosanguineous Exudate Color: red, brown Foul Odor After Cleansing: No Wound Bed Granulation Amount: Medium (34-66%) Exposed Structure Granulation Quality: Pink Fascia Exposed: No Necrotic  Amount: Medium (34-66%) Fat Layer Exposed: No Necrotic Quality: Adherent Slough Tendon Exposed: No [name], [name] ([id number]) Muscle Exposed: No Joint Exposed: No Bone Exposed: No Limited to Skin Breakdown Periwound Skin Texture Texture Color No Abnormalities Noted: No No Abnormalities Noted: No Callus: Yes Atrophie Blanche: No Crepitus: No Cyanosis: No Excoriation: No Ecchymosis: No Fluctuance: No Erythema: No Friable: No Hemosiderin Staining: No Induration: No Mottled: No Localized Edema: Yes Pallor: No Rash: No Rubor: No Scarring: No Temperature / Pain Moisture Temperature: No Abnormality No Abnormalities Noted: No Dry / Scaly: Yes Maceration: No Moist: Yes Wound Preparation Ulcer Cleansing: Rinsed/Irrigated with Saline Topical Anesthetic Applied: Other: lidocaine 4%, Treatment Notes Wound #6R (Left, Plantar Foot) 1. Cleansed with: Clean wound with Normal Saline 4. Dressing Applied: Iodoflex 5. Secondary Dressing Applied Gauze and Kerlix/Conform 7. Secured with Secretary/administratorTape Electronic Signature(s) Signed: 08/31/2015 4:53:02 PM By: Elpidio EricAfful, Rita BSN, RN Entered By: Elpidio EricAfful, Rita on 08/31/2015 10:47:35 [name], [name] ([id number]) -------------------------------------------------------------------------------- Wound Assessment Details Patient Name: [name], [name] Date of Service: 08/31/2015 10:45 AM  Medical Record Number: [id number] Patient Account Number: [id number] Date of Birth/Sex: 1924/08/09 (82 y.o. Male) Treating RN: Clover Mealy, RN, BSN, Rita Primary Care Physician: Vonita Moss Other Clinician: Referring Physician: Vonita Moss Treating Physician/Extender: Altamese [place] in Treatment: 6 Wound Status Wound Number: 7 Primary Diabetic Wound/Ulcer of the Lower Etiology: Extremity Wound Location: Left Metatarsal head first Wound Open Wounding Event: Gradually Appeared Status: Date Acquired: 07/11/2015 Comorbid Hypertension, Type II Diabetes,  History Weeks Of Treatment: 6 History: of pressure wounds, Osteoarthritis, Clustered Wound: No Neuropathy Photos Photo Uploaded By: Elpidio Eric on 08/31/2015 16:48:41 Wound Measurements Length: (cm) 2 Width: (cm) 2.5 Depth: (cm) 0.2 Area: (cm) 3.927 Volume: (cm) 0.785 % Reduction in Area: -233.4% % Reduction in Volume: -503.8% Epithelialization: Small (1-33%) Tunneling: No Undermining: No Wound Description Classification: Grade 1 Wound Margin: Distinct, outline attached Exudate Amount: Medium Exudate Type: Serosanguineous Exudate Color: red, brown Foul Odor After Cleansing: No Wound Bed Granulation Amount: Small (1-33%) Exposed Structure Granulation Quality: Pink Fascia Exposed: No Necrotic Amount: Large (67-100%) Fat Layer Exposed: No Necrotic Quality: Adherent Slough Tendon Exposed: No [name], [name] ([id number]) Muscle Exposed: No Joint Exposed: No Bone Exposed: No Limited to Skin Breakdown Periwound Skin Texture Texture Color No Abnormalities Noted: No No Abnormalities Noted: No Callus: Yes Atrophie Blanche: No Crepitus: No Cyanosis: No Excoriation: No Ecchymosis: No Fluctuance: No Erythema: No Friable: No Hemosiderin Staining: No Induration: No Mottled: No Localized Edema: Yes Pallor: No Rash: No Rubor: No Scarring: No Temperature / Pain Moisture Temperature: No Abnormality No Abnormalities Noted: No Tenderness on Palpation: Yes Dry / Scaly: No Maceration: Yes Moist: No Wound Preparation Ulcer Cleansing: Rinsed/Irrigated with Saline Topical Anesthetic Applied: Other: lidocaine 4%, Treatment Notes Wound #7 (Left Metatarsal head first) 1. Cleansed with: Clean wound with Normal Saline 4. Dressing Applied: Iodoflex 5. Secondary Dressing Applied Gauze and Kerlix/Conform 7. Secured with Secretary/administrator) Signed: 08/31/2015 4:53:02 PM By: Elpidio Eric BSN, RN Entered By: Elpidio Eric on 08/31/2015 10:48:02 [name]  ([id number]) -------------------------------------------------------------------------------- Vitals Details Patient Name: Chris Riding K. Date of Service: 08/31/2015 10:45 AM Medical Record Number: [id number] Patient Account Number: [id number] Date of Birth/Sex: 1924/09/06 (82 y.o. Male) Treating RN: Clover Mealy, RN, BSN, Rita Primary Care Physician: Vonita Moss Other Clinician: Referring Physician: Vonita Moss Treating Physician/Extender: Altamese [place] in Treatment: 6 Vital Signs Time Taken: 10:39 Temperature (F): 97.6 Height (in): 72 Pulse (bpm): 70 Weight (lbs): 195 Respiratory Rate (breaths/min): 17 Body Mass Index (BMI): 26.4 Blood Pressure (mmHg): 136/63 Reference Range: 80 - 120 mg / dl Electronic Signature(s) Signed: 08/31/2015 4:53:02 PM By: Elpidio Eric BSN, RN Entered By: Elpidio Eric on 08/31/2015 10:42:03

## 2015-09-01 NOTE — Progress Notes (Signed)
[name] ([id number]) Visit Report for 08/31/2015 Chief Complaint Document Details Patient Name: [name], [name]. Date of Service: 08/31/2015 10:45 AM Medical Record Patient Account Number: [id number] Number: Treating RN: Baruch Gouty, RN, BSN, Rita [birth year] [phone] y.o. Other Clinician: Date of Birth/Sex: Male) Treating ROBSON, MICHAEL Primary Care Physician: Golden Pop Physician/Extender: G Referring Physician: Golden Pop Weeks in Treatment: 6 Information Obtained from: Patient Chief Complaint Has had swelling of both lower extremities with some ulceration on the right lower extremity. 07/20/15 patient returns today predominantly for a painful wound on the plantar aspect of his left foot of recent onset. Electronic Signature(s) Signed: 08/31/2015 5:20:54 PM By: [name] MD Entered By: [name] on 08/31/2015 11:06:18 [name] ([id number]) -------------------------------------------------------------------------------- Debridement Details Patient Name: Chris Bottoms K. Date of Service: 08/31/2015 10:45 AM Medical Record Patient Account Number: [id number] Number: Treating RN: Baruch Gouty, RN, BSN, Rita 10-Jul-1924 [phone] y.o. Other Clinician: Date of Birth/Sex: Male) Treating ROBSON, [place] Primary Care Physician: Jeananne Rama, MARK Physician/Extender: G Referring Physician: Golden Pop Weeks in Treatment: 6 Debridement Performed for Wound #5 Left Calcaneus Assessment: Performed By: Physician [name], MD Debridement: Debridement Pre-procedure Yes Verification/Time Out Taken: Start Time: 10:50 Pain Control: Lidocaine 4% Topical Solution Level: Skin/Subcutaneous Tissue Total Area Debrided (L x 1 (cm) x 1 (cm) = 1 (cm) W): Tissue and other Non-Viable, Fibrin/Slough, Subcutaneous material debrided: Instrument: Curette Bleeding: Minimum Hemostasis Achieved: Pressure End Time: 10:55 Procedural Pain: 0 Post Procedural  Pain: 0 Response to Treatment: Procedure was tolerated well Post Debridement Measurements of Total Wound Length: (cm) 1 Width: (cm) 1 Depth: (cm) 0.2 Volume: (cm) 0.157 Post Procedure Diagnosis Same as Pre-procedure Electronic Signature(s) Signed: 08/31/2015 4:53:02 PM By: Regan Lemming BSN, RN Signed: 08/31/2015 5:20:54 PM By: [name] MD Entered By: [name] on 08/31/2015 11:05:47 [name] ([id number]) -------------------------------------------------------------------------------- Debridement Details Patient Name: Chris Bottoms K. Date of Service: 08/31/2015 10:45 AM Medical Record Patient Account Number: [id number] Number: Treating RN: Baruch Gouty, RN, BSN, Rita 07-Oct-1924 [phone] y.o. Other Clinician: Date of Birth/Sex: Male) Treating ROBSON, [place] Primary Care Physician: Jeananne Rama, MARK Physician/Extender: G Referring Physician: Golden Pop Weeks in Treatment: 6 Debridement Performed for Wound #7 Left Metatarsal head first Assessment: Performed By: Physician [name], MD Debridement: Debridement Pre-procedure Yes Verification/Time Out Taken: Start Time: 10:46 Pain Control: Lidocaine 4% Topical Solution Level: Skin/Subcutaneous Tissue Total Area Debrided (L x 2 (cm) x 2.5 (cm) = 5 (cm) W): Tissue and other Non-Viable, Fibrin/Slough, Subcutaneous material debrided: Instrument: Curette Bleeding: Minimum Hemostasis Achieved: Pressure End Time: 10:50 Procedural Pain: 0 Post Procedural Pain: 0 Response to Treatment: Procedure was tolerated well Post Debridement Measurements of Total Wound Length: (cm) 2 Width: (cm) 2.5 Depth: (cm) 0.2 Volume: (cm) 0.785 Post Procedure Diagnosis Same as Pre-procedure Electronic Signature(s) Signed: 08/31/2015 4:53:02 PM By: Regan Lemming BSN, RN Signed: 08/31/2015 5:20:54 PM By: [name] MD Entered By: [name] on 08/31/2015 11:06:00 [name]  ([id number]) -------------------------------------------------------------------------------- HPI Details Patient Name: Chris Bottoms K. Date of Service: 08/31/2015 10:45 AM Medical Record Patient Account Number: [id number] Number: Treating RN: Baruch Gouty, RN, BSN, Rita [birth year] [phone] y.o. Other Clinician: Date of Birth/Sex: Male) Treating ROBSON, MICHAEL Primary Care Physician: Golden Pop Physician/Extender: G Referring Physician: Golden Pop Weeks in Treatment: 6 History of Present Illness Location: had swelling of the right lower extremity with some ulceration on the medial part of his calf and on the right fifth toe Quality: Patient reports No Pain. Severity: Patient states wound (  s) are getting better. Duration: Patient has had the wound for < 2 weeks prior to presenting for treatment Context: The wound appeared gradually over time. nail on his right fifth toe came off loose and he pried it Modifying Factors: Other treatment(s) tried include:vascular workup where he is going to be seeing Dr. Lucky Cowboy on Monday Associated Signs and Symptoms: Patient reports presence of swelling HPI Description: Very pleasant 82 year old with a past medical history significant for type 2 diabetes. He underwent left knee surgery in February 2015. He had an angioplasty of his R posterior tibial on 10/30/13. Vascular workup done in April of this year showed an ABI on the right to be noncompressible and the left to be 0.86. About a week ago he noticed some swelling on the right lower extremity and then an ulceration on the medial part of his calf. He also noted that the right fifth nail bed had a loose nail and he pried it open and now has an open wound. He has no evidence of cellulitis. he was seen by the nurse practitioner at his PCPs office and put on Bactroban ointment and doxycycline for 10 days. 01/21/2015 -- he was seen by Dr. Ronalee Belts and though we do not have the note, we know he had used  an Unna's boot and asked him to come to see as again for a compression bandage. The patient has no fresh complaints. 07/20/15; the patient returns today predominantly for her wounds on his left plantar foot. He was seen here in the summer last on 8/19 for wounds on his right medial calf and right fifth nail bed. I don't know that he actually returned to be discharged, he is wearing compression on bilateral legs. He has known peripheral arterial disease in the setting of type 2 diabetes having an angioplasty on the right posterior tibial artery on 10/30/13. His ABI calculated in the clinic today was 0.97 on the right. I don't think the left could be done. The patient tells me that roughly a week ago he started to develop pain in the plantar aspect of his left foot. I think his caregiver noticed the wounds and he is here for our review events. He is minimally ambulatory, spends most of his time in a wheelchair. I am not certain of the issue here. 07/27/15; the patient is here for 3 wounds. The first over his left first met head, second over the left medial heel and the third over the left fourth metatarsal head. Culture from the fourth metatarsal head last week showed Klebsiella. This would not be sensitive to the doxycycline possibly I have therefore changed him to Keflex to which the Klebsiella should be sensitive 08/10/15; patient returns today. He was not seen last week. He does have home health. The area over the left fourth metatarsal head is almost completely closed the area over the medial aspect of his left first metatarsal head and the left medial heel still required debridement Stettner, Keymari K. ([id number]) 08/17/15 the area over the fourth metatarsal head has resolved. The area on his left first metatarsal head lateral aspect and the left medial heel still require surgical debridement although the surface is beginning to look a bit better. Is followed with vascular surgery and is apparently  being planned for an angiogram and possible stent on 3/28 08/23/15 the patient complained of pain over his fourth metatarsal head, the previously closed wound. Physical exam did not really show a source he had a small amount of callus which  I lightly removed to expose a large amount of purulent drainage. The area was then fully debridement the area on the first metatarsal head lateral aspect and left medial heel appear improved [Iodosorb] 08/31/15; the patient is completed a well-tolerated angioplasty of the left posterior tibial artery. The left common femoral profunda femoris and superficial femoral arteries are diffusely diseased but there was no hemodynamically significant lesion. Culture I did last week from the left fourth metatarsal head enterococcus I'm going to give him another week of Keflex which should cover this. Patient states he had some pain but it seems to have resolved in the sole of his foot Electronic Signature(s) Signed: 08/31/2015 5:20:54 PM By: [name] MD Entered By: [name] on 08/31/2015 11:08:52 Stanis, Junie [name] ([id number]) -------------------------------------------------------------------------------- Physical Exam Details Patient Name: Chris Bottoms K. Date of Service: 08/31/2015 10:45 AM Medical Record Patient Account Number: [id number] Number: Treating RN: Baruch Gouty, RN, BSN, Rita 1924-08-26 [phone] y.o. Other Clinician: Date of Birth/Sex: Male) Treating ROBSON, MICHAEL Primary Care Physician: Golden Pop Physician/Extender: G Referring Physician: Golden Pop Weeks in Treatment: 6 Notes Wound exam; the left fourth metatarsal/plantar reopening appears stable. Post debridement this actually cleans up nicely. The most worrisome wound is on the lateral aspect of the left first metatarsal head this is precariously close the bone also requires a surgical debridement. The wound on the lateral aspect of his heel and small but with still with  some depth. Electronic Signature(s) Signed: 08/31/2015 5:20:54 PM By: [name] MD Entered By: [name] on 08/31/2015 11:10:34 Ty Hilts ([id number]) -------------------------------------------------------------------------------- Physician Orders Details Patient Name: Ty Hilts. Date of Service: 08/31/2015 10:45 AM Medical Record Patient Account Number: [id number] Number: Treating RN: Baruch Gouty, RN, BSN, Rita [birth year] [phone] y.o. Other Clinician: Date of Birth/Sex: Male) Treating ROBSON, [place] Primary Care Physician: Golden Pop Physician/Extender: G Referring Physician: Golden Pop Weeks in Treatment: 6 Verbal / Phone Orders: Yes Clinician: Afful, RN, BSN, Rita Read Back and Verified: Yes Diagnosis Coding Wound Cleansing Wound #5 Left Calcaneus o Cleanse wound with mild soap and water Wound #6R Left,Plantar Foot o Cleanse wound with mild soap and water Wound #7 Left Metatarsal head first o Cleanse wound with mild soap and water Anesthetic Wound #5 Left Calcaneus o Topical Lidocaine 4% cream applied to wound bed prior to debridement Wound #6R Left,Plantar Foot o Topical Lidocaine 4% cream applied to wound bed prior to debridement Wound #7 Left Metatarsal head first o Topical Lidocaine 4% cream applied to wound bed prior to debridement Primary Wound Dressing Wound #5 Left Calcaneus o Iodoflex Wound #6R Left,Plantar Foot o Iodoflex Wound #7 Left Metatarsal head first o Iodoflex Secondary Dressing Wound #5 Left Calcaneus o Gauze and Kerlix/Conform Wound #6R Left,Plantar Foot Fearn, Ricard K. ([id number]) o Gauze and Kerlix/Conform Wound #7 Left Metatarsal head first o Gauze and Kerlix/Conform Dressing Change Frequency Wound #5 Left Calcaneus o Change dressing every day. Wound #6R Left,Plantar Foot o Change dressing every day. Wound #7 Left Metatarsal head first o Change dressing every  day. Follow-up Appointments Wound #5 Left Calcaneus o Return Appointment in 1 week. Wound #6R Left,Plantar Foot o Return Appointment in 1 week. Wound #7 Left Metatarsal head first o Return Appointment in 1 week. Off-Loading Wound #5 Left Calcaneus o Turn and reposition every 2 hours Wound #6R Left,Plantar Foot o Turn and reposition every 2 hours Wound #7 Left Metatarsal head first o Turn and reposition every 2 hours Additional Orders / Instructions Wound #5 Left Calcaneus o  Increase protein intake. o Activity as tolerated Wound #6R Left,Plantar Foot o Increase protein intake. o Activity as tolerated Wound #7 Left Metatarsal head first o Increase protein intake. o Activity as tolerated Rhoads, Zackrey K. ([id number]) Home Health Wound #5 Left Calcaneus o Continue Home Health Visits - [place] Nurse may visit PRN to address patientos wound care needs. o FACE TO FACE ENCOUNTER: MEDICARE and MEDICAID PATIENTS: I certify that this patient is under my care and that I had a face-to-face encounter that meets the physician face-to-face encounter requirements with this patient on this date. The encounter with the patient was in whole or in part for the following MEDICAL CONDITION: (primary reason for [place]) MEDICAL NECESSITY: I certify, that based on my findings, NURSING services are a medically necessary home health service. HOME BOUND STATUS: I certify that my clinical findings support that this patient is homebound (i.e., Due to illness or injury, pt requires aid of supportive devices such as crutches, cane, wheelchairs, walkers, the use of special transportation or the assistance of another person to leave their place of residence. There is a normal inability to leave the home and doing so requires considerable and taxing effort. Other absences are for medical reasons / religious services and are infrequent or of short  duration when for other reasons). o If current dressing causes regression in wound condition, may D/C ordered dressing product/s and apply Normal Saline Moist Dressing daily until next [place] / Other MD appointment. [place] of regression in wound condition at [phone]. o Please direct any NON-WOUND related issues/requests for orders to patient's Primary Care Physician Wound #6R [place] Nurse may visit PRN to address patientos wound care needs. o FACE TO FACE ENCOUNTER: MEDICARE and MEDICAID PATIENTS: I certify that this patient is under my care and that I had a face-to-face encounter that meets the physician face-to-face encounter requirements with this patient on this date. The encounter with the patient was in whole or in part for the following MEDICAL CONDITION: (primary reason for [place]) MEDICAL NECESSITY: I certify, that based on my findings, NURSING services are a medically necessary home health service. HOME BOUND STATUS: I certify that my clinical findings support that this patient is homebound (i.e., Due to illness or injury, pt requires aid of supportive devices such as crutches, cane, wheelchairs, walkers, the use of special transportation or the assistance of another person to leave their place of residence. There is a normal inability to leave the home and doing so requires considerable and taxing effort. Other absences are for medical reasons / religious services and are infrequent or of short duration when for other reasons). o If current dressing causes regression in wound condition, may D/C ordered dressing product/s and apply Normal Saline Moist Dressing daily until next [place] / Other MD appointment. [place] of regression in wound condition at [phone]. o Please direct any NON-WOUND related issues/requests for orders to  patient's Primary Care Physician Wound #7 Left Metatarsal head first o [place] Nurse may visit PRN to address patientos wound care needs. o FACE TO FACE ENCOUNTER: MEDICARE and MEDICAID PATIENTS: I certify that this patient is under my care and that I had a face-to-face encounter that meets the physician face-to-face encounter requirements with this patient on this date. The encounter with the patient was in whole or in part for the  following MEDICAL CONDITION: (primary reason for Home Healthcare) DAMICO, PARTIN ([id number]) MEDICAL NECESSITY: I certify, that based on my findings, NURSING services are a medically necessary home health service. HOME BOUND STATUS: I certify that my clinical findings support that this patient is homebound (i.e., Due to illness or injury, pt requires aid of supportive devices such as crutches, cane, wheelchairs, walkers, the use of special transportation or the assistance of another person to leave their place of residence. There is a normal inability to leave the home and doing so requires considerable and taxing effort. Other absences are for medical reasons / religious services and are infrequent or of short duration when for other reasons). o If current dressing causes regression in wound condition, may D/C ordered dressing product/s and apply Normal Saline Moist Dressing daily until next [place] / Other MD appointment. [place] of regression in wound condition at [phone]. o Please direct any NON-WOUND related issues/requests for orders to patient's Primary Care Physician Medications-please add to medication list. Wound #5 Left Calcaneus o P.O. Antibiotics - Keflex 537m by mouth TID x 7 days Wound #6R Left,Plantar Foot o P.O. Antibiotics - Keflex 5047mby mouth TID x 7 days Wound #7 Left Metatarsal head first o P.O. Antibiotics - Keflex 50024my mouth TID x 7  days Radiology o MRI, lower extremity without contrast - left foot Electronic Signature(s) Signed: 08/31/2015 4:53:02 PM By: AffRegan LemmingN, RN Signed: 08/31/2015 5:20:54 PM By: RobLinton Ham Entered By: AffRegan Lemming 08/31/2015 10:59:03 HENKAULANA, [id number]------------------------------------------------------------------------------- Prescription 08/31/2015 Patient Name: HENTy Hiltsysician: ROBRicard Dillon Date of Birth: 8/1October 01, 1926I#: [id number]: M DEA#: [id number] #: [id number] #: [id number] Address: [address]RP[address] [place] [id number]uiMonettarTushkaC [phone] No Known Drug Allergies Physician's Orders P.O. Antibiotics - Keflex 500m25m mouth TID x 7 days Signature(s): Date(s): Electronic Signature(s) Signed: 08/31/2015 4:53:02 PM By: AffuRegan Lemming, RN Signed: 08/31/2015 5:20:54 PM By: RobsLinton HamEntered By: AffuRegan Lemming03/29/2017 10:59:04 Taitano, ELMEJunie [id number]------------------------------------------------------------------------------  Problem List Details Patient Name: HENSThomasenia BottomsDate of Service: 08/31/2015 10:45 AM Medical Record Patient Account Number: [id number]: Treating RN: AffuBaruch Gouty, BSN, Rita [birth year] [phone]. 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/20/2015 Yes layer exposed L97.511 Non-pressure chronic ulcer of other part of right foot 07/20/2015 Yes limited to breakdown of skin E11.621 Type 2 diabetes mellitus with foot  ulcer 07/20/2015 Yes E11.51 Type 2 diabetes mellitus with diabetic peripheral 07/20/2015 Yes angiopathy without gangrene Inactive Problems Resolved Problems Electronic Signature(s) Signed: 08/31/2015 5:20:54 PM By: RobsLinton HamEntered By: RobsLinton Ham03/29/2017 11:05:26 Sakata, ELMEJunie [id number]------------------------------------------------------------------------------ Progress Note Details Patient Name: HENSThomasenia BottomsDate of Service: 08/31/2015 10:45 AM Medical Record Patient Account Number: [id number]: Treating RN: AffuBaruch Gouty, BSN, Rita 8/11Apr 05, 1926 [phone]. Other Clinician: Date of Birth/Sex: Male) Treating ROBSON, MICHAEL Primary Care Physician: CRISGolden Popsician/Extender: G Referring Physician: CRISGolden Popks in Treatment: 6 Subjective Chief Complaint Information obtained from Patient Has had swelling of both lower extremities with some ulceration on the right lower  extremity. 07/20/15 patient returns today predominantly for a painful wound on the plantar aspect of his left foot of recent onset. History of Present Illness (HPI) The following HPI elements were documented for the patient's wound: Location: had swelling of the right lower extremity with some ulceration on the medial part of his calf and on the right fifth toe Quality: Patient reports No Pain. Severity: Patient states wound (s) are getting better. Duration: Patient has had the wound for < 2 weeks prior to presenting for treatment Context: The wound appeared gradually over time. nail on his right fifth toe came off loose and he pried it Modifying Factors: Other treatment(s) tried include:vascular workup where he is going to be seeing Dr. Lucky Cowboy on Monday Associated Signs and Symptoms: Patient reports presence of swelling Very pleasant 82 year old with a past medical history significant for type 2 diabetes. He underwent left knee surgery in February 2015. He had an  angioplasty of his R posterior tibial on 10/30/13. Vascular workup done in April of this year showed an ABI on the right to be noncompressible and the left to be 0.86. About a week ago he noticed some swelling on the right lower extremity and then an ulceration on the medial part of his calf. He also noted that the right fifth nail bed had a loose nail and he pried it open and now has an open wound. He has no evidence of cellulitis. he was seen by the nurse practitioner at his PCPs office and put on Bactroban ointment and doxycycline for 10 days. 01/21/2015 -- he was seen by Dr. Ronalee Belts and though we do not have the note, we know he had used an Unna's boot and asked him to come to see as again for a compression bandage. The patient has no fresh complaints. 07/20/15; the patient returns today predominantly for her wounds on his left plantar foot. He was seen here in the summer last on 8/19 for wounds on his right medial calf and right fifth nail bed. I don't know that he actually returned to be discharged, he is wearing compression on bilateral legs. He has known peripheral arterial disease in the setting of type 2 diabetes having an angioplasty on the right posterior tibial artery on 10/30/13. His ABI calculated in the clinic today was 0.97 on the right. I don't think the left could be done. The patient tells me that roughly a week ago he started to develop pain in the plantar aspect of his left foot. I Urschel, Anddy K. ([id number]) think his caregiver noticed the wounds and he is here for our review events. He is minimally ambulatory, spends most of his time in a wheelchair. I am not certain of the issue here. 07/27/15; the patient is here for 3 wounds. The first over his left first met head, second over the left medial heel and the third over the left fourth metatarsal head. Culture from the fourth metatarsal head last week showed Klebsiella. This would not be sensitive to the doxycycline possibly  I have therefore changed him to Keflex to which the Klebsiella should be sensitive 08/10/15; patient returns today. He was not seen last week. He does have home health. The area over the left fourth metatarsal head is almost completely closed the area over the medial aspect of his left first metatarsal head and the left medial heel still required debridement 08/17/15 the area over the fourth metatarsal head has resolved. The area on his left first metatarsal head lateral aspect and  the left medial heel still require surgical debridement although the surface is beginning to look a bit better. Is followed with vascular surgery and is apparently being planned for an angiogram and possible stent on 3/28 08/23/15 the patient complained of pain over his fourth metatarsal head, the previously closed wound. Physical exam did not really show a source he had a small amount of callus which I lightly removed to expose a large amount of purulent drainage. The area was then fully debridement the area on the first metatarsal head lateral aspect and left medial heel appear improved [Iodosorb] 08/31/15; the patient is completed a well-tolerated angioplasty of the left posterior tibial artery. The left common femoral profunda femoris and superficial femoral arteries are diffusely diseased but there was no hemodynamically significant lesion. Culture I did last week from the left fourth metatarsal head enterococcus I'm going to give him another week of Keflex which should cover this. Patient states he had some pain but it seems to have resolved in the sole of his foot Objective Constitutional Vitals Time Taken: 10:39 AM, Height: 72 in, Weight: 195 lbs, BMI: 26.4, Temperature: 97.6 F, Pulse: 70 bpm, Respiratory Rate: 17 breaths/min, Blood Pressure: 136/63 mmHg. Integumentary (Hair, Skin) Wound #5 status is Open. Original cause of wound was Blister. The wound is located on the Left Calcaneus. The wound measures 1cm  length x 1cm width x 0.2cm depth; 0.785cm^2 area and 0.157cm^3 volume. The wound is limited to skin breakdown. There is no tunneling or undermining noted. There is a small amount of serosanguineous drainage noted. The wound margin is distinct with the outline attached to the wound base. There is small (1-33%) red, pink granulation within the wound bed. There is a medium (34- 66%) amount of necrotic tissue within the wound bed including Adherent Slough. The periwound skin appearance exhibited: Localized Edema, Dry/Scaly, Moist. The periwound skin appearance did not exhibit: Callus, Crepitus, Excoriation, Fluctuance, Friable, Induration, Rash, Scarring, Maceration, Atrophie Blanche, Cyanosis, Ecchymosis, Hemosiderin Staining, Mottled, Pallor, Rubor, Erythema. Periwound temperature was noted as No Abnormality. The periwound has tenderness on palpation. Wound #6R status is Open. Original cause of wound was Gradually Appeared. The wound is located on the [place]. The wound measures 1cm length x 1.3cm width x 0.2cm depth; 1.021cm^2 area and 0.204cm^3 volume. The wound is limited to skin breakdown. There is no tunneling or undermining noted. Muns, Loden K. ([id number]) There is a large amount of serosanguineous drainage noted. The wound margin is indistinct and nonvisible. There is medium (34-66%) pink granulation within the wound bed. There is a medium (34-66%) amount of necrotic tissue within the wound bed including Adherent Slough. The periwound skin appearance exhibited: Callus, Localized Edema, Dry/Scaly, Moist. The periwound skin appearance did not exhibit: Crepitus, Excoriation, Fluctuance, Friable, Induration, Rash, Scarring, Maceration, Atrophie Blanche, Cyanosis, Ecchymosis, Hemosiderin Staining, Mottled, Pallor, Rubor, Erythema. Periwound temperature was noted as No Abnormality. Wound #7 status is Open. Original cause of wound was Gradually Appeared. The wound is located on  the Left Metatarsal head first. The wound measures 2cm length x 2.5cm width x 0.2cm depth; 3.927cm^2 area and 0.785cm^3 volume. The wound is limited to skin breakdown. There is no tunneling or undermining noted. There is a medium amount of serosanguineous drainage noted. The wound margin is distinct with the outline attached to the wound base. There is small (1-33%) pink granulation within the wound bed. There is a large (67-100%) amount of necrotic tissue within the wound bed including Adherent Slough. The periwound skin appearance  exhibited: Callus, Localized Edema, Maceration. The periwound skin appearance did not exhibit: Crepitus, Excoriation, Fluctuance, Friable, Induration, Rash, Scarring, Dry/Scaly, Moist, Atrophie Blanche, Cyanosis, Ecchymosis, Hemosiderin Staining, Mottled, Pallor, Rubor, Erythema. Periwound temperature was noted as No Abnormality. The periwound has tenderness on palpation. Assessment Active Problems ICD-10 L97.522 - Non-pressure chronic ulcer of other part of left foot with fat layer exposed L97.511 - Non-pressure chronic ulcer of other part of right foot limited to breakdown of skin E11.621 - Type 2 diabetes mellitus with foot ulcer E11.51 - Type 2 diabetes mellitus with diabetic peripheral angiopathy without gangrene Procedures Wound #5 Wound #5 is a Diabetic Wound/Ulcer of the Lower Extremity located on the Left Calcaneus . There was a Skin/Subcutaneous Tissue Debridement ([id number]) debridement with total area of 1 sq cm performed by [name], MD. with the following instrument(s): Curette to remove Non-Viable tissue/material including Fibrin/Slough and Subcutaneous after achieving pain control using Lidocaine 4% Topical Solution. A time out was conducted prior to the start of the procedure. A Minimum amount of bleeding was controlled with Pressure. The procedure was tolerated well with a pain level of 0 throughout and a pain level of 0 following  the procedure. Post Debridement Measurements: 1cm length x 1cm width x 0.2cm depth; 0.157cm^3 volume. Post procedure Diagnosis Wound #5: Same as Pre-Procedure Fewell, Seaton K. ([id number]) Wound #7 Wound #7 is a Diabetic Wound/Ulcer of the Lower Extremity located on the Left Metatarsal head first . There was a Skin/Subcutaneous Tissue Debridement ([id number]) debridement with total area of 5 sq cm performed by [name], MD. with the following instrument(s): Curette to remove Non-Viable tissue/material including Fibrin/Slough and Subcutaneous after achieving pain control using Lidocaine 4% Topical Solution. A time out was conducted prior to the start of the procedure. A Minimum amount of bleeding was controlled with Pressure. The procedure was tolerated well with a pain level of 0 throughout and a pain level of 0 following the procedure. Post Debridement Measurements: 2cm length x 2.5cm width x 0.2cm depth; 0.785cm^3 volume. Post procedure Diagnosis Wound #7: Same as Pre-Procedure Plan Wound Cleansing: Wound #5 Left Calcaneus: Cleanse wound with mild soap and water Wound #6R Left,Plantar Foot: Cleanse wound with mild soap and water Wound #7 Left Metatarsal head first: Cleanse wound with mild soap and water Anesthetic: Wound #5 Left Calcaneus: Topical Lidocaine 4% cream applied to wound bed prior to debridement Wound #6R Left,Plantar Foot: Topical Lidocaine 4% cream applied to wound bed prior to debridement Wound #7 Left Metatarsal head first: Topical Lidocaine 4% cream applied to wound bed prior to debridement Primary Wound Dressing: Wound #5 Left Calcaneus: Iodoflex Wound #6R Left,Plantar Foot: Iodoflex Wound #7 Left Metatarsal head first: Iodoflex Secondary Dressing: Wound #5 Left Calcaneus: Gauze and Kerlix/Conform Wound #6R Left,Plantar Foot: Gauze and Kerlix/Conform Wound #7 Left Metatarsal head first: Gauze and Kerlix/Conform Dressing Change Frequency: Wound  #5 Left Calcaneus: Motta, Trevone K. ([id number]) Change dressing every day. Wound #6R Left,Plantar Foot: Change dressing every day. Wound #7 Left Metatarsal head first: Change dressing every day. Follow-up Appointments: Wound #5 Left Calcaneus: Return Appointment in 1 week. Wound #6R Left,Plantar Foot: Return Appointment in 1 week. Wound #7 Left Metatarsal head first: Return Appointment in 1 week. Off-Loading: Wound #5 Left Calcaneus: Turn and reposition every 2 hours Wound #6R Left,Plantar Foot: Turn and reposition every 2 hours Wound #7 Left Metatarsal head first: Turn and reposition every 2 hours Additional Orders / Instructions: Wound #5 Left Calcaneus: Increase protein intake. Activity as tolerated Wound #  6R Left,Plantar Foot: Increase protein intake. Activity as tolerated Wound #7 Left Metatarsal head first: Increase protein intake. Activity as tolerated Home Health: Wound #5 Left Calcaneus: Continue Home Health Visits - [place] Nurse may visit PRN to address patient s wound care needs. FACE TO FACE ENCOUNTER: MEDICARE and MEDICAID PATIENTS: I certify that this patient is under my care and that I had a face-to-face encounter that meets the physician face-to-face encounter requirements with this patient on this date. The encounter with the patient was in whole or in part for the following MEDICAL CONDITION: (primary reason for [place]) MEDICAL NECESSITY: I certify, that based on my findings, NURSING services are a medically necessary home health service. HOME BOUND STATUS: I certify that my clinical findings support that this patient is homebound (i.e., Due to illness or injury, pt requires aid of supportive devices such as crutches, cane, wheelchairs, walkers, the use of special transportation or the assistance of another person to leave their place of residence. There is a normal inability to leave the home and doing so requires considerable and  taxing effort. Other absences are for medical reasons / religious services and are infrequent or of short duration when for other reasons). If current dressing causes regression in wound condition, may D/C ordered dressing product/s and apply Normal Saline Moist Dressing daily until next [place] / Other MD appointment. [place] of regression in wound condition at [phone]. Please direct any NON-WOUND related issues/requests for orders to patient's Primary Care Physician Wound #6R Left,Plantar Foot: [place] Nurse may visit PRN to address patient s wound care needs. FACE TO FACE ENCOUNTER: MEDICARE and MEDICAID PATIENTS: I certify that this patient is under [place], Elisa K. ([id number]) my care and that I had a face-to-face encounter that meets the physician face-to-face encounter requirements with this patient on this date. The encounter with the patient was in whole or in part for the following MEDICAL CONDITION: (primary reason for [place]) MEDICAL NECESSITY: I certify, that based on my findings, NURSING services are a medically necessary home health service. HOME BOUND STATUS: I certify that my clinical findings support that this patient is homebound (i.e., Due to illness or injury, pt requires aid of supportive devices such as crutches, cane, wheelchairs, walkers, the use of special transportation or the assistance of another person to leave their place of residence. There is a normal inability to leave the home and doing so requires considerable and taxing effort. Other absences are for medical reasons / religious services and are infrequent or of short duration when for other reasons). If current dressing causes regression in wound condition, may D/C ordered dressing product/s and apply Normal Saline Moist Dressing daily until next [place] / Other MD appointment. [place] of  regression in wound condition at [phone]. Please direct any NON-WOUND related issues/requests for orders to patient's Primary Care Physician Wound #7 Left Metatarsal head first: [place] Nurse may visit PRN to address patient s wound care needs. FACE TO FACE ENCOUNTER: MEDICARE and MEDICAID PATIENTS: I certify that this patient is under my care and that I had a face-to-face encounter that meets the physician face-to-face encounter requirements with this patient on this date. The encounter with the patient was in whole or in part for the following MEDICAL CONDITION: (primary reason for [place]) MEDICAL NECESSITY: I certify, that based on my findings, NURSING services are a medically  necessary home health service. HOME BOUND STATUS: I certify that my clinical findings support that this patient is homebound (i.e., Due to illness or injury, pt requires aid of supportive devices such as crutches, cane, wheelchairs, walkers, the use of special transportation or the assistance of another person to leave their place of residence. There is a normal inability to leave the home and doing so requires considerable and taxing effort. Other absences are for medical reasons / religious services and are infrequent or of short duration when for other reasons). If current dressing causes regression in wound condition, may D/C ordered dressing product/s and apply Normal Saline Moist Dressing daily until next [place] / Other MD appointment. [place] of regression in wound condition at [phone]. Please direct any NON-WOUND related issues/requests for orders to patient's Primary Care Physician Medications-please add to medication list.: Wound #5 Left Calcaneus: P.O. Antibiotics - Keflex 575m by mouth TID x 7 days Wound #6R Left,Plantar Foot: P.O. Antibiotics - Keflex 5048mby mouth TID x 7 days Wound #7 Left Metatarsal head  first: P.O. Antibiotics - Keflex 50025my mouth TID x 7 days Radiology ordered were: MRI, lower extremity without contrast - left foot 1 the patient is had revascularization of the posterior tibial artery angioplasty. 2In spite of this I am concerned about the wounds especially the one on the lateral aspect of his left first metatarsal head. An x-ray I did on 2/17Noted prior resection of theFifth metatarsal.Is no evidence of osteomyelitis.He is going to need to go on to have an MRI. 3 We continue to address this with Iodoflex.He has home health.Leave their changing the dressing.I have Heldt, Dimitry K. ([id number] his KeflexSecondary to the Enterococcus faecium out of the plantar fourth toe wound last week. I am concerned about osteomyelitis of the first metatarsal head. Electronic Signature(s) Signed: 08/31/2015 5:20:54 PM By: RobLinton Ham Entered By: RobLinton Ham 08/31/2015 11:14:38 Rundell, ELMJunie [id number]------------------------------------------------------------------------------- SuperBill Details Patient Name: HENThomasenia Bottoms Date of Service: 08/31/2015 Medical Record Patient Account Number: [id number]: Treating RN: AffBaruch GoutyN, BSN, Rita [birth year]/[id number]. Other Clinician: Date of Birth/Sex: Male) Treating ROBSON, M[place]ary Care Physician: CRIGolden Popysician/Extender: G Referring Physician: CRIGolden Popeks in Treatment: 6 Diagnosis Coding ICD-10 Codes Code Description [id number]-pressure chronic ulcer of other part of left foot with fat layer exposed L97.511 Non-pressure chronic ulcer of other part of right foot limited to breakdown of skin E11.621 Type 2 diabetes mellitus with foot ulcer E11.51 Type 2 diabetes mellitus with diabetic peripheral angiopathy without gangrene Facility Procedures CPT4 Code Description: [id number] - DEB SUBQ TISSUE 20 SQ CM/< ICD-10 Description Diagnosis L97.522 Non-pressure  chronic ulcer of other part of left foot Modifier: with fat lay Quantity: 1 er exposed Physician Procedures CPT4 Code Description: [id number] - WC PHYS SUBQ TISS 20 SQ CM ICD-10 Description Diagnosis L97.522 Non-pressure chronic ulcer of other part of left foot Modifier: with fat laye Quantity: 1 r exposed Electronic Signature(s) Signed: 08/31/2015 5:20:54 PM By: RobLinton Ham Entered By: RobLinton Ham 08/31/2015 11:16:56

## 2015-09-04 ENCOUNTER — Encounter: Payer: Self-pay | Admitting: Vascular Surgery

## 2015-09-05 ENCOUNTER — Ambulatory Visit
Admission: RE | Admit: 2015-09-05 | Discharge: 2015-09-05 | Disposition: A | Discharge status: Home / Self Care | Payer: PPO | Source: Ambulatory Visit | Attending: Internal Medicine | Admitting: Internal Medicine

## 2015-09-05 DIAGNOSIS — M79672 Pain in left foot: Secondary | ICD-10-CM

## 2015-09-05 DIAGNOSIS — R609 Edema, unspecified: Secondary | ICD-10-CM

## 2015-09-07 ENCOUNTER — Encounter: Payer: PPO | Attending: Internal Medicine | Admitting: Internal Medicine

## 2015-09-07 DIAGNOSIS — E1140 Type 2 diabetes mellitus with diabetic neuropathy, unspecified: Secondary | ICD-10-CM | POA: Diagnosis not present

## 2015-09-07 DIAGNOSIS — E1151 Type 2 diabetes mellitus with diabetic peripheral angiopathy without gangrene: Secondary | ICD-10-CM | POA: Insufficient documentation

## 2015-09-07 DIAGNOSIS — M1990 Unspecified osteoarthritis, unspecified site: Secondary | ICD-10-CM | POA: Diagnosis not present

## 2015-09-07 DIAGNOSIS — L97511 Non-pressure chronic ulcer of other part of right foot limited to breakdown of skin: Secondary | ICD-10-CM | POA: Insufficient documentation

## 2015-09-07 DIAGNOSIS — E11621 Type 2 diabetes mellitus with foot ulcer: Secondary | ICD-10-CM | POA: Diagnosis not present

## 2015-09-07 DIAGNOSIS — L97522 Non-pressure chronic ulcer of other part of left foot with fat layer exposed: Secondary | ICD-10-CM | POA: Diagnosis not present

## 2015-09-07 DIAGNOSIS — G20 Parkinson's disease: Secondary | ICD-10-CM | POA: Diagnosis not present

## 2015-09-07 DIAGNOSIS — I10 Essential (primary) hypertension: Secondary | ICD-10-CM | POA: Insufficient documentation

## 2015-09-08 NOTE — Progress Notes (Signed)
CANDICE, LUNNEY ([id number]) Visit Report for 09/07/2015 Chief Complaint Document Details Patient Name: [name]. Date of Service: 09/07/2015 10:45 AM Medical Record Patient Account Number: [id number] Number: Treating [name]: [name] 11-11-1924 [phone] y.o. Other Clinician: Date of Birth/Sex: Male) Treating [name] Primary Care Physician: [name] Physician/Extender: G Referring Physician: Golden [name] Weeks in Treatment: 7 Information Obtained from: Patient Chief Complaint Has had swelling of both lower extremities with some ulceration on the right lower extremity. 07/20/15 patient returns today predominantly for a painful wound on the plantar aspect of his left foot of recent onset. Electronic Signature(s) Signed: 09/07/2015 4:30:01 PM By: [name] Entered By: Chris Ham on 09/07/2015 12:55:50 Colpitts, [name] ([id number]) -------------------------------------------------------------------------------- Debridement Details Patient Name: [name]. Date of Service: 09/07/2015 10:45 AM Medical Record Patient Account Number: [id number] Number: Treating [name]: [name] 06-28-1924 [phone] y.o. Other Clinician: Date of Birth/Sex: Male) Treating [name] Primary Care Physician: [name] Physician/Extender: G Referring Physician: Golden [name] Weeks in Treatment: 7 Debridement Performed for Wound #7 Left Metatarsal head first Assessment: Performed By: Physician Chris Dillon, [name] Debridement: Debridement Pre-procedure Yes Verification/Time Out Taken: Start Time: 11:24 Pain Control: Lidocaine 4% Topical Solution Level: Skin/Subcutaneous Tissue Total Area Debrided (L x 2.2 (cm) x 2.2 (cm) = 4.84 (cm) W): Tissue and other Non-Viable, Exudate, Fibrin/Slough, Subcutaneous material debrided: Instrument: Curette Bleeding: Moderate Hemostasis Achieved: Pressure End Time: 11:28 Procedural  Pain: 0 Post Procedural Pain: 0 Response to Treatment: Procedure was tolerated well Post Debridement Measurements of Total Wound Length: (cm) 2.2 Width: (cm) 2.2 Depth: (cm) 2.2 Volume: (cm) 8.363 Post Procedure Diagnosis Same as Pre-procedure Electronic Signature(s) Signed: 09/07/2015 4:00:39 PM By: [name] Signed: 09/07/2015 4:30:01 PM By: [name] Entered By: Chris Ham on 09/07/2015 12:55:26 Debord, [name] ([id number]) -------------------------------------------------------------------------------- HPI Details Patient Name: [name]. Date of Service: 09/07/2015 10:45 AM Medical Record Patient Account Number: [id number] Number: Treating [name]: [name] 1924-11-29 [phone] y.o. Other Clinician: Date of Birth/Sex: Male) Treating [name] Primary Care Physician: [name] Physician/Extender: G Referring Physician: Golden [name] Weeks in Treatment: 7 History of Present Illness Location: had swelling of the right lower extremity with some ulceration on the medial part of his calf and on the right fifth toe Quality: Patient reports No Pain. Severity: Patient states wound (s) are getting better. Duration: Patient has had the wound for < 2 weeks prior to presenting for treatment Context: The wound appeared gradually over time. nail on his right fifth toe came off loose and he pried it Modifying Factors: Other treatment(s) tried include:vascular workup where he is going to be seeing [name] on Monday Associated Signs and Symptoms: Patient reports presence of swelling HPI Description: Very pleasant 82 year old with a past medical history significant for type 2 diabetes. He underwent left knee surgery in February 2015. He had an angioplasty of his R posterior tibial on 10/30/13. Vascular workup done in April of this year showed an ABI on the right to be noncompressible and the left to be 0.86. About a week ago he noticed  some swelling on the right lower extremity and then an ulceration on the medial part of his calf. He also noted that the right fifth nail bed had a loose nail and he pried it open and now has an open wound. He has no evidence of cellulitis. he was seen by the nurse practitioner at his PCPs office  and put on Bactroban ointment and doxycycline for 10 days. 01/21/2015 -- he was seen by Dr. Ronalee [name] and though we do not have the note, we know he had used an Unna's boot and asked him to come to see as again for a compression bandage. The patient has no fresh complaints. 07/20/15; the patient returns today predominantly for her wounds on his left plantar foot. He was seen here in the summer last on 8/19 for wounds on his right medial calf and right fifth nail bed. I don't know that he actually returned to be discharged, he is wearing compression on bilateral legs. He has known peripheral arterial disease in the setting of type 2 diabetes having an angioplasty on the right posterior tibial artery on 10/30/13. His ABI calculated in the clinic today was 0.97 on the right. I don't think the left could be done. The patient tells me that roughly a week ago he started to develop pain in the plantar aspect of his left foot. I think his caregiver noticed the wounds and he is here for our review events. He is minimally ambulatory, spends most of his time in a wheelchair. I am not certain of the issue here. 07/27/15; the patient is here for 3 wounds. The first over his left first met head, second over the left medial heel and the third over the left fourth metatarsal head. Culture from the fourth metatarsal head last week showed Klebsiella. This would not be sensitive to the doxycycline possibly I have therefore changed him to Keflex to which the Klebsiella should be sensitive 08/10/15; patient returns today. He was not seen last week. He does have home health. The area over the left fourth metatarsal head is almost  completely closed the area over the medial aspect of his left first metatarsal head and the left medial heel still required debridement [name]. ([id number]) 08/17/15 the area over the fourth metatarsal head has resolved. The area on his left first metatarsal head lateral aspect and the left medial heel still require surgical debridement although the surface is beginning to look a bit better. Is followed with vascular surgery and is apparently being planned for an angiogram and possible stent on 3/28 08/23/15 the patient complained of pain over his fourth metatarsal head, the previously closed wound. Physical exam did not really show a source he had a small amount of callus which I lightly removed to expose a large amount of purulent drainage. The area was then fully debridement the area on the first metatarsal head lateral aspect and left medial heel appear improved [Iodosorb] 08/31/15; the patient is completed a well-tolerated angioplasty of the left posterior tibial artery. The left common femoral profunda femoris and superficial femoral arteries are diffusely diseased but there was no hemodynamically significant lesion. Culture I did last week from the left fourth metatarsal head enterococcus I'm going to give him another week of Keflex which should cover this. Patient states he had some pain but it seems to have resolved in the sole of his foot 09/07/15 he has completed his Keflex yesterday. Could not do the MRI due to contracture of the left leg apparently this can be done in a "mobile MRI" book for April 29 Electronic Signature(s) Signed: 09/07/2015 4:30:01 PM By: [name] Entered By: Chris Ham on 09/07/2015 12:57:41 Banh, [name] ([id number]) -------------------------------------------------------------------------------- Physical Exam Details Patient Name: [name]. Date of Service: 09/07/2015 10:45 AM Medical Record Patient Account Number:  [id number] Number: Treating [name]:  Afful, [name], BSN, Velva Harman 1925-04-09 (82 y.o. Other Clinician: Date of Birth/Sex: Male) Treating [name] Primary Care Physician: [name] Physician/Extender: G Referring Physician: Golden [name] Weeks in Treatment: 7 Notes Wound exam; the left fourth metatarsal head appears stable. Debridement done here. Continued worrisome wound on the lateral aspect of the left first metatarsal head this is on nor precariously close to bone. The wound on the lateral aspect of his heel was small but still with some depth no major change Electronic Signature(s) Signed: 09/07/2015 4:30:01 PM By: [name] Entered By: Chris Ham on 09/07/2015 12:58:27 Ty Hilts ([id number]) -------------------------------------------------------------------------------- Physician Orders Details Patient Name: [name]. Date of Service: 09/07/2015 10:45 AM Medical Record Patient Account Number: [id number] Number: Treating [name]: [name] 1924-08-24 [phone] y.o. Other Clinician: Date of Birth/Sex: Male) Treating [name], [place] Primary Care Physician: [name] Physician/Extender: G Referring Physician: Golden [name] Weeks in Treatment: 7 Verbal / Phone Orders: Yes Clinician: Afful, [name], BSN, [name] Read Back and Verified: Yes Diagnosis Coding Wound Cleansing Wound #5 Left Calcaneus o Cleanse wound with mild soap and water Wound #6R Left,Plantar Foot o Cleanse wound with mild soap and water Wound #7 Left Metatarsal head first o Cleanse wound with mild soap and water Anesthetic Wound #5 Left Calcaneus o Topical Lidocaine 4% cream applied to wound bed prior to debridement Wound #6R Left,Plantar Foot o Topical Lidocaine 4% cream applied to wound bed prior to debridement Wound #7 Left Metatarsal head first o Topical Lidocaine 4% cream applied to wound bed prior to debridement Primary Wound Dressing Wound  #5 Left Calcaneus o Iodoflex Wound #6R Left,Plantar Foot o Iodoflex Wound #7 Left Metatarsal head first o Iodoflex Secondary Dressing Wound #5 Left Calcaneus o Gauze and Kerlix/Conform Wound #6R Left,Plantar Foot Frisina, Frederik [name]. ([id number]) o Gauze and Kerlix/Conform Wound #7 Left Metatarsal head first o Gauze and Kerlix/Conform Dressing Change Frequency Wound #5 Left Calcaneus o Change dressing every day. Wound #6R Left,Plantar Foot o Change dressing every day. Wound #7 Left Metatarsal head first o Change dressing every day. Follow-up Appointments Wound #5 Left Calcaneus o Return Appointment in 1 week. Wound #6R Left,Plantar Foot o Return Appointment in 1 week. Wound #7 Left Metatarsal head first o Return Appointment in 1 week. Off-Loading Wound #5 Left Calcaneus o Turn and reposition every 2 hours Wound #6R Left,Plantar Foot o Turn and reposition every 2 hours Wound #7 Left Metatarsal head first o Turn and reposition every 2 hours Additional Orders / Instructions Wound #5 Left Calcaneus o Increase protein intake. o Activity as tolerated Wound #6R Left,Plantar Foot o Increase protein intake. o Activity as tolerated Wound #7 Left Metatarsal head first o Increase protein intake. o Activity as tolerated Pemberton, Owais [name]. ([id number]) Home Health Wound #5 Left Calcaneus o Continue Home Health Visits - [place] Nurse may visit PRN to address patientos wound care needs. o FACE TO FACE ENCOUNTER: MEDICARE and MEDICAID PATIENTS: I certify that this patient is under my care and that I had a face-to-face encounter that meets the physician face-to-face encounter requirements with this patient on this date. The encounter with the patient was in whole or in part for the following MEDICAL CONDITION: (primary reason for [place]) MEDICAL NECESSITY: I certify, that based on my findings, NURSING services are a  medically necessary home health service. HOME BOUND STATUS: I certify that my clinical findings support that this patient is homebound (i.e., Due to illness or injury, pt requires  aid of supportive devices such as crutches, cane, wheelchairs, walkers, the use of special transportation or the assistance of another person to leave their place of residence. There is a normal inability to leave the home and doing so requires considerable and taxing effort. Other absences are for medical reasons / religious services and are infrequent or of short duration when for other reasons). o If current dressing causes regression in wound condition, may D/C ordered dressing product/s and apply Normal Saline Moist Dressing daily until next [place] / Other [name] appointment. [place] of regression in wound condition at [phone]. o Please direct any NON-WOUND related issues/requests for orders to patient's Primary Care Physician Wound #6R [place] Nurse may visit PRN to address patientos wound care needs. o FACE TO FACE ENCOUNTER: MEDICARE and MEDICAID PATIENTS: I certify that this patient is under my care and that I had a face-to-face encounter that meets the physician face-to-face encounter requirements with this patient on this date. The encounter with the patient was in whole or in part for the following MEDICAL CONDITION: (primary reason for [place]) MEDICAL NECESSITY: I certify, that based on my findings, NURSING services are a medically necessary home health service. HOME BOUND STATUS: I certify that my clinical findings support that this patient is homebound (i.e., Due to illness or injury, pt requires aid of supportive devices such as crutches, cane, wheelchairs, walkers, the use of special transportation or the assistance of another person to leave their place of residence. There is a normal  inability to leave the home and doing so requires considerable and taxing effort. Other absences are for medical reasons / religious services and are infrequent or of short duration when for other reasons). o If current dressing causes regression in wound condition, may D/C ordered dressing product/s and apply Normal Saline Moist Dressing daily until next [place] / Other [name] appointment. [place] of regression in wound condition at [phone]. o Please direct any NON-WOUND related issues/requests for orders to patient's Primary Care Physician Wound #7 Left Metatarsal head first o [place] Nurse may visit PRN to address patientos wound care needs. o FACE TO FACE ENCOUNTER: MEDICARE and MEDICAID PATIENTS: I certify that this patient is under my care and that I had a face-to-face encounter that meets the physician face-to-face encounter requirements with this patient on this date. The encounter with the patient was in whole or in part for the following MEDICAL CONDITION: (primary reason for [place]) JONAN, SEUFERT ([id number]) MEDICAL NECESSITY: I certify, that based on my findings, NURSING services are a medically necessary home health service. HOME BOUND STATUS: I certify that my clinical findings support that this patient is homebound (i.e., Due to illness or injury, pt requires aid of supportive devices such as crutches, cane, wheelchairs, walkers, the use of special transportation or the assistance of another person to leave their place of residence. There is a normal inability to leave the home and doing so requires considerable and taxing effort. Other absences are for medical reasons / religious services and are infrequent or of short duration when for other reasons). o If current dressing causes regression in wound condition, may D/C ordered dressing product/s and apply Normal Saline Moist Dressing  daily until next [place] / Other [name] appointment. [place] of regression in wound condition at [phone]. o Please direct any NON-WOUND  related issues/requests for orders to patient's Primary Care Physician Medications-please add to medication list. Wound #5 Left Calcaneus o P.O. Antibiotics - Keflex 527m by mouth TID x 7 days Wound #6R Left,Plantar Foot o P.O. Antibiotics - Keflex 5035mby mouth TID x 7 days Wound #7 Left Metatarsal head first o P.O. Antibiotics - Keflex 50033my mouth TID x 7 days Electronic Signature(s) Signed: 09/07/2015 4:00:39 PM By: AffRegan LemmingN, [name] Signed: 09/07/2015 4:30:01 PM By: RobLinton Ham Entered By: AffRegan Lemming 09/07/2015 11:32:13 H[place]ORANCE, [id number]------------------------------------------------------------------------------- Prescription 09/07/2015 Patient Name: HENTy Hiltsysician: ROBRicard [name] Date of Birth: 8/123-Nov-1926I#: [id number]: M DEA#: [id number] #: [id number] #: [id number] Address: [place]RT[address] [id number]ui[place]ussetar[place]attle Creek[place] [id number] 8625lergies No Known Drug Allergies Physician's Orders P.O. Antibiotics - Keflex 500m40m mouth TID x 7 days Signature(s): Date(s): Electronic Signature(s) Signed: 09/07/2015 4:30:01 PM By: RobsLinton HamEntered By: RobsLinton Ham04/10/2015 13:01:28 [place], ELMEJunie [id number]------------------------------------------------------------------------------  Problem List Details Patient Name: HENSThomasenia BottomsDate of Service: 09/07/2015 10:45 AM Medical Record Patient Account Number: [id number]: Treating [name]: AffuBaruch Gouty, BSN, [name] [birth year] [phone]. 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/20/2015 Yes layer exposed L97.511 Non-pressure chronic ulcer of other part of right foot 07/20/2015 Yes limited to breakdown of skin E11.621 Type 2 diabetes mellitus with foot ulcer 07/20/2015 Yes E11.51 Type 2 diabetes mellitus with diabetic peripheral 07/20/2015 Yes angiopathy without gangrene Inactive Problems Resolved Problems Electronic Signature(s) Signed: 09/07/2015 4:30:01 PM By: RobsLinton HamEntered By: RobsLinton Ham04/10/2015 12:55:10 [place], ELMEJunie [id number]------------------------------------------------------------------------------ Progress Note Details Patient Name: HENSThomasenia BottomsDate of Service: 09/07/2015 10:45 AM Medical Record Patient Account Number: [id number]: Treating [name]: AffuBaruch Gouty, BSN, [name] [birth year] [phone]. Other Clinician: Date of Birth/Sex: Male) Treating [name] Primary Care Physician: CRISGolden Popsician/Extender: G Referring Physician: CRISGolden Popks in Treatment: 7 Subjective Chief Complaint Information obtained from Patient Has had swelling of both lower extremities with some ulceration on the right lower extremity. 07/20/15 patient returns today predominantly for a painful wound on the plantar aspect of his left foot of recent onset. History of Present Illness (HPI) The following HPI elements were documented for the patient's wound: Location: had swelling of the right lower extremity with some ulceration on the medial part of his calf and on the right fifth toe Quality: Patient reports No Pain. Severity: Patient states wound (s) are getting better. Duration: Patient has had the wound for < 2 weeks prior to presenting for  treatment Context: The wound appeared gradually over time. nail on his right fifth toe came off loose and he pried it Modifying Factors: Other treatment(s) tried include:vascular workup where he is going to be seeing Dr. Dew Lucky CowboyMonday Associated Signs and Symptoms: Patient reports presence of swelling Very pleasant 89 y55r old with a past medical history significant for type 2 diabetes. He underwent left knee surgery in February 2015. He had an angioplasty of his R posterior tibial on 10/30/13. Vascular workup done in April of this year showed an ABI on the right to  be noncompressible and the left to be 0.86. About a week ago he noticed some swelling on the right lower extremity and then an ulceration on the medial part of his calf. He also noted that the right fifth nail bed had a loose nail and he pried it open and now has an open wound. He has no evidence of cellulitis. he was seen by the nurse practitioner at his PCPs office and put on Bactroban ointment and doxycycline for 10 days. 01/21/2015 -- he was seen by Dr. Ronalee [name] and though we do not have the note, we know he had used an Unna's boot and asked him to come to see as again for a compression bandage. The patient has no fresh complaints. 07/20/15; the patient returns today predominantly for her wounds on his left plantar foot. He was seen here in the summer last on 8/19 for wounds on his right medial calf and right fifth nail bed. I don't know that he actually returned to be discharged, he is wearing compression on bilateral legs. He has known peripheral arterial disease in the setting of type 2 diabetes having an angioplasty on the right posterior tibial artery on 10/30/13. His ABI calculated in the clinic today was 0.97 on the right. I don't think the left could be done. The patient tells me that roughly a week ago he started to develop pain in the plantar aspect of his left foot. I Shiel, Jacorey [name]. ([id number]) think his caregiver  noticed the wounds and he is here for our review events. He is minimally ambulatory, spends most of his time in a wheelchair. I am not certain of the issue here. 07/27/15; the patient is here for 3 wounds. The first over his left first met head, second over the left medial heel and the third over the left fourth metatarsal head. Culture from the fourth metatarsal head last week showed Klebsiella. This would not be sensitive to the doxycycline possibly I have therefore changed him to Keflex to which the Klebsiella should be sensitive 08/10/15; patient returns today. He was not seen last week. He does have home health. The area over the left fourth metatarsal head is almost completely closed the area over the medial aspect of his left first metatarsal head and the left medial heel still required debridement 08/17/15 the area over the fourth metatarsal head has resolved. The area on his left first metatarsal head lateral aspect and the left medial heel still require surgical debridement although the surface is beginning to look a bit better. Is followed with vascular surgery and is apparently being planned for an angiogram and possible stent on 3/28 08/23/15 the patient complained of pain over his fourth metatarsal head, the previously closed wound. Physical exam did not really show a source he had a small amount of callus which I lightly removed to expose a large amount of purulent drainage. The area was then fully debridement the area on the first metatarsal head lateral aspect and left medial heel appear improved [Iodosorb] 08/31/15; the patient is completed a well-tolerated angioplasty of the left posterior tibial artery. The left common femoral profunda femoris and superficial femoral arteries are diffusely diseased but there was no hemodynamically significant lesion. Culture I did last week from the left fourth metatarsal head enterococcus I'm going to give him another week of Keflex which should  cover this. Patient states he had some pain but it seems to have resolved in the sole of his foot 09/07/15 he has completed his  Keflex yesterday. Could not do the MRI due to contracture of the left leg apparently this can be done in a "mobile MRI" book for April 29 Objective Constitutional Vitals Time Taken: 10:57 AM, Height: 72 in, Weight: 195 lbs, BMI: 26.4, Temperature: 97.7 F, Pulse: 72 bpm, Respiratory Rate: 16 breaths/min, Blood Pressure: 133/67 mmHg. Integumentary (Hair, Skin) Wound #5 status is Open. Original cause of wound was Blister. The wound is located on the Left Calcaneus. The wound measures 1cm length x 1cm width x 0.2cm depth; 0.785cm^2 area and 0.157cm^3 volume. The wound is limited to skin breakdown. There is no tunneling or undermining noted. There is a small amount of serosanguineous drainage noted. The wound margin is distinct with the outline attached to the wound base. There is small (1-33%) red, pink granulation within the wound bed. There is a medium (34- 66%) amount of necrotic tissue within the wound bed including Adherent Slough. The periwound skin appearance exhibited: Localized Edema, Dry/Scaly, Moist. The periwound skin appearance did not exhibit: Callus, Crepitus, Excoriation, Fluctuance, Friable, Induration, Rash, Scarring, Maceration, Atrophie Blanche, Cyanosis, Ecchymosis, Hemosiderin Staining, Mottled, Pallor, Rubor, Erythema. Periwound temperature was noted as No Abnormality. The periwound has tenderness on palpation. Wound #6R status is Open. Original cause of wound was Gradually Appeared. The wound is located on the [place], BURRELL [name]. ([id number]) Left,Plantar Foot. The wound measures 0.5cm length x 1cm width x 0.2cm depth; 0.393cm^2 area and 0.079cm^3 volume. The wound is limited to skin breakdown. There is no tunneling or undermining noted. There is a large amount of serosanguineous drainage noted. The wound margin is distinct with the outline attached  to the wound base. There is medium (34-66%) pink granulation within the wound bed. There is a small (1-33%) amount of necrotic tissue within the wound bed including Adherent Slough. The periwound skin appearance exhibited: Callus, Localized Edema, Moist. The periwound skin appearance did not exhibit: Crepitus, Excoriation, Fluctuance, Friable, Induration, Rash, Scarring, Dry/Scaly, Maceration, Atrophie Blanche, Cyanosis, Ecchymosis, Hemosiderin Staining, Mottled, Pallor, Rubor, Erythema. Periwound temperature was noted as No Abnormality. Wound #7 status is Open. Original cause of wound was Gradually Appeared. The wound is located on the Left Metatarsal head first. The wound measures 2.2cm length x 2.2cm width x 0.2cm depth; 3.801cm^2 area and 0.76cm^3 volume. The wound is limited to skin breakdown. There is no tunneling or undermining noted. There is a medium amount of serosanguineous drainage noted. The wound margin is distinct with the outline attached to the wound base. There is small (1-33%) pink granulation within the wound bed. There is a large (67-100%) amount of necrotic tissue within the wound bed including Adherent Slough. The periwound skin appearance exhibited: Callus, Localized Edema, Maceration, Moist. The periwound skin appearance did not exhibit: Crepitus, Excoriation, Fluctuance, Friable, Induration, Rash, Scarring, Dry/Scaly, Atrophie Blanche, Cyanosis, Ecchymosis, Hemosiderin Staining, Mottled, Pallor, Rubor, Erythema. Periwound temperature was noted as No Abnormality. The periwound has tenderness on palpation. Assessment Active Problems ICD-10 L97.522 - Non-pressure chronic ulcer of other part of left foot with fat layer exposed L97.511 - Non-pressure chronic ulcer of other part of right foot limited to breakdown of skin E11.621 - Type 2 diabetes mellitus with foot ulcer E11.51 - Type 2 diabetes mellitus with diabetic peripheral angiopathy without  gangrene Procedures Wound #7 Wound #7 is a Diabetic Wound/Ulcer of the Lower Extremity located on the Left Metatarsal head first . There was a Skin/Subcutaneous Tissue Debridement ([id number]) debridement with total area of 4.84 sq cm performed by Chris Dillon, [name]. with the  following instrument(s): Curette to remove Non- Viable tissue/material including Exudate, Fibrin/Slough, and Subcutaneous after achieving pain control using Lidocaine 4% Topical Solution. A time out was conducted prior to the start of the procedure. A Moderate amount of bleeding was controlled with Pressure. The procedure was tolerated well with a pain level of 0 throughout and a pain level of 0 following the procedure. Post Debridement Measurements: 2.2cm Devito, [name]. ([id number]) length x 2.2cm width x 2.2cm depth; 8.363cm^3 volume. Post procedure Diagnosis Wound #7: Same as Pre-Procedure Plan Wound Cleansing: Wound #5 Left Calcaneus: Cleanse wound with mild soap and water Wound #6R Left,Plantar Foot: Cleanse wound with mild soap and water Wound #7 Left Metatarsal head first: Cleanse wound with mild soap and water Anesthetic: Wound #5 Left Calcaneus: Topical Lidocaine 4% cream applied to wound bed prior to debridement Wound #6R Left,Plantar Foot: Topical Lidocaine 4% cream applied to wound bed prior to debridement Wound #7 Left Metatarsal head first: Topical Lidocaine 4% cream applied to wound bed prior to debridement Primary Wound Dressing: Wound #5 Left Calcaneus: Iodoflex Wound #6R Left,Plantar Foot: Iodoflex Wound #7 Left Metatarsal head first: Iodoflex Secondary Dressing: Wound #5 Left Calcaneus: Gauze and Kerlix/Conform Wound #6R Left,Plantar Foot: Gauze and Kerlix/Conform Wound #7 Left Metatarsal head first: Gauze and Kerlix/Conform Dressing Change Frequency: Wound #5 Left Calcaneus: Change dressing every day. Wound #6R Left,Plantar Foot: Change dressing every day. Wound #7 Left  Metatarsal head first: Change dressing every day. Follow-up Appointments: Wound #5 Left Calcaneus: Return Appointment in 1 week. Wound #6R Left,Plantar Foot: Return Appointment in 1 week. GIOVANNIE, SCERBO ([id number]) Wound #7 Left Metatarsal head first: Return Appointment in 1 week. Off-Loading: Wound #5 Left Calcaneus: Turn and reposition every 2 hours Wound #6R Left,Plantar Foot: Turn and reposition every 2 hours Wound #7 Left Metatarsal head first: Turn and reposition every 2 hours Additional Orders / Instructions: Wound #5 Left Calcaneus: Increase protein intake. Activity as tolerated Wound #6R Left,Plantar Foot: Increase protein intake. Activity as tolerated Wound #7 Left Metatarsal head first: Increase protein intake. Activity as tolerated Home Health: Wound #5 Left Calcaneus: Continue Home Health Visits - [place] Nurse may visit PRN to address patient s wound care needs. FACE TO FACE ENCOUNTER: MEDICARE and MEDICAID PATIENTS: I certify that this patient is under my care and that I had a face-to-face encounter that meets the physician face-to-face encounter requirements with this patient on this date. The encounter with the patient was in whole or in part for the following MEDICAL CONDITION: (primary reason for [place]) MEDICAL NECESSITY: I certify, that based on my findings, NURSING services are a medically necessary home health service. HOME BOUND STATUS: I certify that my clinical findings support that this patient is homebound (i.e., Due to illness or injury, pt requires aid of supportive devices such as crutches, cane, wheelchairs, walkers, the use of special transportation or the assistance of another person to leave their place of residence. There is a normal inability to leave the home and doing so requires considerable and taxing effort. Other absences are for medical reasons / religious services and are infrequent or of short duration when  for other reasons). If current dressing causes regression in wound condition, may D/C ordered dressing product/s and apply Normal Saline Moist Dressing daily until next [place] / Other [name] appointment. [place] of regression in wound condition at [phone]. Please direct any NON-WOUND related issues/requests for orders to patient's Primary Care Physician Wound #6R Left,Plantar Foot:  [place] Nurse may visit PRN to address patient s wound care needs. FACE TO FACE ENCOUNTER: MEDICARE and MEDICAID PATIENTS: I certify that this patient is under my care and that I had a face-to-face encounter that meets the physician face-to-face encounter requirements with this patient on this date. The encounter with the patient was in whole or in part for the following MEDICAL CONDITION: (primary reason for [place]) MEDICAL NECESSITY: I certify, that based on my findings, NURSING services are a medically necessary home health service. HOME BOUND STATUS: I certify that my clinical findings support that this patient is homebound (i.e., Due to illness or injury, pt requires aid of supportive devices such as crutches, cane, wheelchairs, walkers, the use of special transportation or the assistance of another person to leave their place of residence. There is a normal inability to leave the home and doing so requires considerable and taxing effort. Other absences are for medical reasons / religious services and are infrequent or of short duration when for other reasons). If current dressing causes regression in wound condition, may D/C ordered dressing product/s and apply Bernardini, Christohper [name]. ([id number]) Normal Saline Moist Dressing daily until next [place] / Other [name] appointment. [place] of regression in wound condition at [phone]. Please direct any NON-WOUND related issues/requests for orders to patient's  Primary Care Physician Wound #7 Left Metatarsal head first: [place] Nurse may visit PRN to address patient s wound care needs. FACE TO FACE ENCOUNTER: MEDICARE and MEDICAID PATIENTS: I certify that this patient is under my care and that I had a face-to-face encounter that meets the physician face-to-face encounter requirements with this patient on this date. The encounter with the patient was in whole or in part for the following MEDICAL CONDITION: (primary reason for [place]) MEDICAL NECESSITY: I certify, that based on my findings, NURSING services are a medically necessary home health service. HOME BOUND STATUS: I certify that my clinical findings support that this patient is homebound (i.e., Due to illness or injury, pt requires aid of supportive devices such as crutches, cane, wheelchairs, walkers, the use of special transportation or the assistance of another person to leave their place of residence. There is a normal inability to leave the home and doing so requires considerable and taxing effort. Other absences are for medical reasons / religious services and are infrequent or of short duration when for other reasons). If current dressing causes regression in wound condition, may D/C ordered dressing product/s and apply Normal Saline Moist Dressing daily until next [place] / Other [name] appointment. [place] of regression in wound condition at [phone]. Please direct any NON-WOUND related issues/requests for orders to patient's Primary Care Physician Medications-please add to medication list.: Wound #5 Left Calcaneus: P.O. Antibiotics - Keflex 580m by mouth TID x 7 days Wound #6R Left,Plantar Foot: P.O. Antibiotics - Keflex 5027mby mouth TID x 7 days Wound #7 Left Metatarsal head first: P.O. Antibiotics - Keflex 50024my mouth TID x 7 days 1 delay in the MRI is unfortunate but not really avoidable. 2 we will  continue the Iodoflex to all wounds he has home health changing the dressing Electronic Signature(s) Signed: 09/07/2015 4:30:01 PM By: RobLinton Ham Entered By: RobLinton Ham 09/07/2015 13:01:02 [place], ELMJunie [id number]------------------------------------------------------------------------------- SuperBill Details Patient Name: HENThomasenia Bottoms Date of Service: 09/07/2015 Medical Record Patient Account Number: [id number]: Treating [name]:  Afful, [name], BSN, Velva Harman Sep 30, 1924 (82 y.o. Other Clinician: Date of Birth/Sex: Male) Treating [name], [place] Primary Care Physician: [name] Physician/Extender: G Referring Physician: Golden [name] Weeks in Treatment: 7 Diagnosis Coding ICD-10 Codes Code Description [phone] Non-pressure chronic ulcer of other part of left foot with fat layer exposed L97.511 Non-pressure chronic ulcer of other part of right foot limited to breakdown of skin E11.621 Type 2 diabetes mellitus with foot ulcer E11.51 Type 2 diabetes mellitus with diabetic peripheral angiopathy without gangrene Facility Procedures CPT4 Code Description: [id number] 11042 - DEB SUBQ TISSUE 20 SQ CM/< ICD-10 Description Diagnosis L97.522 Non-pressure chronic ulcer of other part of left foot Modifier: with fat lay Quantity: 1 er exposed Physician Procedures CPT4 Code Description: [id number] 11042 - WC PHYS SUBQ TISS 20 SQ CM ICD-10 Description Diagnosis L97.522 Non-pressure chronic ulcer of other part of left foot Modifier: with fat laye Quantity: 1 r exposed Electronic Signature(s) Signed: 09/07/2015 4:30:01 PM By: [name] Entered By: Chris Ham on 09/07/2015 13:01:22

## 2015-09-08 NOTE — Progress Notes (Signed)
MATHIUS, BIRKELAND ([id number]) Visit Report for 09/07/2015 Arrival Information Details Patient Name: [name], [name]. Date of Service: 09/07/2015 10:45 AM Medical Record Number: [id number] Patient Account Number: [id number] Date of Birth/Sex: 10/07/1924 (82 y.o. Male) Treating RN: Clover Mealy, RN, BSN, [place] Sink Primary Care Physician: Vonita Moss Other Clinician: Referring Physician: Vonita Moss Treating Physician/Extender: Altamese [place] in Treatment: 7 Visit Information History Since Last Visit Added or deleted any medications: No Patient Arrived: Wheel Chair Any new allergies or adverse reactions: No Arrival Time: 10:47 Had a fall or experienced change in No activities of daily living that may affect Accompanied By: son risk of falls: Transfer Assistance: None Signs or symptoms of abuse/neglect since last No Patient Identification Verified: Yes visito Secondary Verification Process Yes Has Dressing in Place as Prescribed: Yes Completed: Pain Present Now: No Patient Requires Transmission-Based No Precautions: Patient Has Alerts: No Electronic Signature(s) Signed: 09/07/2015 4:00:39 PM By: Elpidio Eric BSN, RN Entered By: Elpidio Eric on 09/07/2015 10:47:55 [name] ([id number]) -------------------------------------------------------------------------------- Encounter Discharge Information Details Patient Name: [name] Date of Service: 09/07/2015 10:45 AM Medical Record Number: [id number] Patient Account Number: [id number] Date of Birth/Sex: 01-07-1925 (82 y.o. Male) Treating RN: Clover Mealy, RN, BSN, [place] Sink Primary Care Physician: Vonita Moss Other Clinician: Referring Physician: Vonita Moss Treating Physician/Extender: Altamese [place] in Treatment: 7 Encounter Discharge Information Items Discharge Pain Level: 0 Discharge Condition: Stable Ambulatory Status: Wheelchair Discharge Destination: Home Transportation: Other Accompanied By:  son Schedule Follow-up Appointment: No Medication Reconciliation completed No and provided to Patient/Care Ellouise Mcwhirter: Provided on Clinical Summary of Care: 09/07/2015 Form Type Recipient Paper Patient [facility] Electronic Signature(s) Signed: 09/07/2015 11:37:09 AM By: Gwenlyn Perking Previous Signature: 09/07/2015 11:04:43 AM Version By: Elpidio Eric BSN, RN Entered By: Gwenlyn Perking on 09/07/2015 11:37:08 [name] ([id number]) -------------------------------------------------------------------------------- Lower Extremity Assessment Details Patient Name: [name] Date of Service: 09/07/2015 10:45 AM Medical Record Number: [id number] Patient Account Number: [id number] Date of Birth/Sex: 1925/01/30 (82 y.o. Male) Treating RN: Clover Mealy, RN, BSN, [place] Sink Primary Care Physician: Vonita Moss Other Clinician: Referring Physician: Vonita Moss Treating Physician/Extender: Altamese [place] in Treatment: 7 Vascular Assessment Pulses: Posterior Tibial Dorsalis Pedis Palpable: [Left:Yes] Extremity colors, hair growth, and conditions: Extremity Color: [Left:Normal] Hair Growth on Extremity: [Left:Yes] Temperature of Extremity: [Left:Warm] Capillary Refill: [Left:< 3 seconds] Toe Nail Assessment Left: Right: Thick: Yes Discolored: Yes Deformed: No Improper Length and Hygiene: No Electronic Signature(s) Signed: 09/07/2015 4:00:39 PM By: Elpidio Eric BSN, RN Entered By: Elpidio Eric on 09/07/2015 10:48:35 Pizano, [name] ([id number]) -------------------------------------------------------------------------------- Multi Wound Chart Details Patient Name: [name] Date of Service: 09/07/2015 10:45 AM Medical Record Number: [id number] Patient Account Number: [id number] Date of Birth/Sex: 31-Jan-1925 (82 y.o. 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] [6R:the Lower Extremity] [7:the Lower Extremity] Comorbid History: [5:Hypertension, Type II Diabetes, History of pressure wounds, Osteoarthritis, Neuropathy Osteoarthritis, Neuropathy Osteoarthritis, Neuropathy] [6R:Hypertension, Type II Diabetes, History of pressure wounds,] [7:Hypertension, Type II  Diabetes, History of pressure wounds,] Date Acquired: [5:07/11/2015] [6R:07/12/2015] [  7:07/11/2015] Weeks of Treatment: [5:7] [6R:7] [7:7] Wound Status: [5:Open] [6R:Open] [7:Open] Wound Recurrence: [5:No] [6R:Yes] [7:No] Measurements L x W x D 1x1x0.2 [6R:0.5x1x0.2] [7:2.2x2.2x0.2] (cm) Area (cm) : [5:0.785] [6R:0.393] [7:3.801] Volume (cm) : [5:0.157] [6R:0.079] [7:0.76] % Reduction in Area: [5:90.90%] [6R:87.50%] [7:-222.70%] % Reduction in Volume: 81.80% [6R:74.80%] [7:-484.60%] Classification: [5:Grade 1] [6R:Grade 1] [7:Grade 1] Exudate Amount: [5:Small] [6R:Large] [7:Medium] Exudate Type: [5:Serosanguineous] [6R:Serosanguineous] [7:Serosanguineous] Exudate Color: [5:red, brown] [6R:red, brown] [7:red, brown] Wound Margin: [5:Distinct, outline attached Distinct, outline attached Distinct, outline attached] Granulation Amount: [5:Small (1-33%)] [6R:Medium (34-66%)] [7:Small (1-33%)] Granulation Quality: [5:Red, Pink] [6R:Pink] [7:Pink] Necrotic Amount: [5:Medium (34-66%)] [6R:Small (1-33%)] [7:Large (67-100%)] Exposed Structures: [5:Fascia: No Fat: No  Tendon: No] [6R:Fascia: No Fat: No Tendon: No] [7:Fascia: No Fat: No Tendon: No] Muscle: No Muscle: No Muscle: No Joint: No Joint: No Joint: No Bone: No Bone: No Bone: No Limited to Skin Limited to Skin Limited to Skin Breakdown Breakdown Breakdown Epithelialization: Small (1-33%) Medium (34-66%) Small (1-33%) Periwound Skin Texture: Edema: Yes Edema: Yes Edema: Yes Excoriation: No Callus: Yes Callus: Yes Induration: No Excoriation: No Excoriation: No Callus: No Induration: No Induration: No Crepitus: No Crepitus: No Crepitus: No Fluctuance: No Fluctuance: No Fluctuance: No Friable: No Friable: No Friable: No Rash: No Rash: No Rash: No Scarring: No Scarring: No Scarring: No Periwound Skin Moist: Yes Moist: Yes Maceration: Yes Moisture: Dry/Scaly: Yes Maceration: No Moist: Yes Maceration: No Dry/Scaly: No Dry/Scaly: No Periwound Skin Color: Atrophie Blanche: No Atrophie Blanche: No Atrophie Blanche: No Cyanosis: No Cyanosis: No Cyanosis: No Ecchymosis: No Ecchymosis: No Ecchymosis: No Erythema: No Erythema: No Erythema: No Hemosiderin Staining: No Hemosiderin Staining: No Hemosiderin Staining: No Mottled: No Mottled: No Mottled: No Pallor: No Pallor: No Pallor: No Rubor: No Rubor: No Rubor: No Temperature: No Abnormality No Abnormality No Abnormality Tenderness on Yes No Yes Palpation: Wound Preparation: Ulcer Cleansing: Ulcer Cleansing: Ulcer Cleansing: Rinsed/Irrigated with Rinsed/Irrigated with Rinsed/Irrigated with Saline Saline Saline Topical Anesthetic Topical Anesthetic Topical Anesthetic Applied: Other: lidocaine Applied: Other: lidocaine Applied: Other: lidocaine 4% 4% 4% Treatment Notes Electronic Signature(s) Signed: 09/07/2015 11:05:32 AM By: Elpidio Eric BSN, RN Entered By: Elpidio Eric on 09/07/2015 11:05:31 [name]  ([id number]) -------------------------------------------------------------------------------- Multi-Disciplinary Care Plan Details Patient Name: [name] Date of Service: 09/07/2015 10:45 AM Medical Record Number: [id number] Patient Account Number: [id number] Date of Birth/Sex: 01-19-1925 (82 y.o. Male) Treating RN: Clover Mealy, RN, BSN, [place] Sink Primary Care Physician: Vonita Moss Other Clinician: Referring Physician: Vonita Moss Treating Physician/Extender: Altamese  in Treatment: 7 Active Inactive Abuse / Safety / Falls / Self Care Management Nursing Diagnoses: Impaired home maintenance Impaired physical mobility Knowledge deficit related to: safety; personal, health (wound), emergency Potential for falls Self care deficit: actual or potential Goals: Patient will remain injury free Date Initiated: 07/20/2015 Goal Status: Active Patient/caregiver will verbalize understanding of skin care regimen Date Initiated: 07/20/2015 Goal Status: Active Patient/caregiver will verbalize/demonstrate measure taken to improve self care Date Initiated: 07/20/2015 Goal Status: Active Patient/caregiver will verbalize/demonstrate measures taken to improve the patient's personal safety Date Initiated: 07/20/2015 Goal Status: Active Patient/caregiver will verbalize/demonstrate measures taken to prevent injury and/or falls Date Initiated: 07/20/2015 Goal Status: Active Patient/caregiver will verbalize/demonstrate understanding of what to do in case of emergency Date Initiated: 07/20/2015 Goal Status: Active Interventions: Assess fall risk on admission and as needed Assess: immobility, friction, shearing, incontinence upon admission and as needed Assess impairment of mobility on admission and as needed per policy Assess self care needs on admission and  as needed Patient referred to community resources (specify in notes) Henry RusselHENSLEY, Servando K. ([id number]) Provide education on basic  hygiene Provide education on fall prevention Provide education on personal and home safety Provide education on safe transfers Treatment Activities: Education provided on Basic Hygiene : 08/10/2015 Notes: Orientation to the Wound Care Program Nursing Diagnoses: Knowledge deficit related to the wound healing center program Goals: Patient/caregiver will verbalize understanding of the Wound Healing Center Program Date Initiated: 07/20/2015 Goal Status: Active Interventions: Provide education on orientation to the wound center Notes: Wound/Skin Impairment Nursing Diagnoses: Impaired tissue integrity Knowledge deficit related to ulceration/compromised skin integrity Goals: Patient/caregiver will verbalize understanding of skin care regimen Date Initiated: 07/20/2015 Goal Status: Active Ulcer/skin breakdown will have a volume reduction of 50% by week 8 Date Initiated: 07/20/2015 Goal Status: Active Ulcer/skin breakdown will have a volume reduction of 80% by week 12 Date Initiated: 07/20/2015 Goal Status: Active Ulcer/skin breakdown will heal within 14 weeks Date Initiated: 07/20/2015 Goal Status: Active Interventions: Assess patient/caregiver ability to obtain necessary supplies Bonnette, Elyon K. ([id number]) Assess patient/caregiver ability to perform ulcer/skin care regimen upon admission and as needed Assess ulceration(s) every visit Provide education on ulcer and skin care Treatment Activities: Patient referred to home care : 09/07/2015 Skin care regimen initiated : 09/07/2015 Topical wound management initiated : 09/07/2015 Notes: Electronic Signature(s) Signed: 09/07/2015 11:05:21 AM By: Elpidio EricAfful, Rita BSN, RN Entered By: Elpidio EricAfful, Rita on 09/07/2015 11:05:20 Goldie, Dorena DewELMER K. ([id number]) -------------------------------------------------------------------------------- Pain Assessment Details Patient Name: [name], Jibran K. Date of Service: 09/07/2015 10:45 AM Medical Record Number:  [id number] Patient Account Number: [id number] Date of Birth/Sex: 1925/02/25 44(82 y.o. Male) Treating RN: Clover MealyAfful, RN, BSN, [place] Sinkita Primary Care Physician: Vonita MossRISSMAN, MARK Other Clinician: Referring Physician: Vonita MossRISSMAN, MARK Treating Physician/Extender: Maxwell CaulOBSON, MICHAEL G Weeks in Treatment: 7 Active Problems Location of Pain Severity and Description of Pain Patient Has Paino No Site Locations Pain Management and Medication Current Pain Management: Electronic Signature(s) Signed: 09/07/2015 4:00:39 PM By: Elpidio EricAfful, Rita BSN, RN Entered By: Elpidio EricAfful, Rita on 09/07/2015 10:48:02 Henry RusselHENSLEY, Keifer K. ([id number]) -------------------------------------------------------------------------------- Patient/Caregiver Education Details Patient Name: [name], Maude K. Date of Service: 09/07/2015 10:45 AM Medical Record Number: [id number] Patient Account Number: [id number] Date of Birth/Gender: 1925/02/25 98(82 y.o. Male) Treating RN: Clover MealyAfful, RN, BSN, [place] Sinkita Primary Care Physician: Vonita MossRISSMAN, MARK Other Clinician: Referring Physician: Vonita MossRISSMAN, MARK Treating Physician/Extender: Altamese [place]OBSON, MICHAEL G Weeks in Treatment: 7 Education Assessment Education Provided To: Patient Education Topics Provided Basic Hygiene: Methods: Explain/Verbal Responses: State content correctly Safety: Methods: Explain/Verbal Responses: State content correctly Welcome To The Wound Care Center: Methods: Explain/Verbal Responses: State content correctly Wound/Skin Impairment: Methods: Explain/Verbal Responses: State content correctly Electronic Signature(s) Signed: 09/07/2015 11:05:07 AM By: Elpidio EricAfful, Rita BSN, RN Entered By: Elpidio EricAfful, Rita on 09/07/2015 11:05:07 Rincon, Dorena DewELMER K. ([id number]) -------------------------------------------------------------------------------- Wound Assessment Details Patient Name: [name], Riyansh K. Date of Service: 09/07/2015 10:45 AM Medical Record Number: [id number] Patient Account Number: [id number] Date of  Birth/Sex: 1925/02/25 24(82 y.o. Male) Treating RN: Clover MealyAfful, RN, BSN, [place] Sinkita Primary Care Physician: Vonita MossRISSMAN, MARK Other Clinician: Referring Physician: Vonita MossRISSMAN, MARK Treating Physician/Extender: Maxwell CaulOBSON, MICHAEL G Weeks in Treatment: 7 Wound Status Wound Number: 5 Primary Diabetic Wound/Ulcer of the Lower Etiology: Extremity Wound Location: Left Calcaneus Wound Open Wounding Event: Blister Status: Date Acquired: 07/11/2015 Comorbid Hypertension, Type II Diabetes, History Weeks Of Treatment: 7 History: of pressure wounds, Osteoarthritis, Clustered Wound: No Neuropathy Photos Photo Uploaded By: Elpidio EricAfful, Rita on 09/07/2015 13:47:32 Wound Measurements Length: (cm) 1 Width: (cm) 1 Depth: (cm) 0.2 Area: (cm) 0.785 Volume: (cm)  0.157 % Reduction in Area: 90.9% % Reduction in Volume: 81.8% Epithelialization: Small (1-33%) Tunneling: No Undermining: No Wound Description Classification: Grade 1 Wound Margin: Distinct, outline attached Exudate Amount: Small Exudate Type: Serosanguineous Exudate Color: red, brown Foul Odor After Cleansing: No Wound Bed Granulation Amount: Small (1-33%) Exposed Structure Granulation Quality: Red, Pink Fascia Exposed: No Necrotic Amount: Medium (34-66%) Fat Layer Exposed: No Necrotic Quality: Adherent Slough Tendon Exposed: No Antuna, Aitan K. ([id number]) Muscle Exposed: No Joint Exposed: No Bone Exposed: No Limited to Skin Breakdown Periwound Skin Texture Texture Color No Abnormalities Noted: No No Abnormalities Noted: No Callus: No Atrophie Blanche: No Crepitus: No Cyanosis: No Excoriation: No Ecchymosis: No Fluctuance: No Erythema: No Friable: No Hemosiderin Staining: No Induration: No Mottled: No Localized Edema: Yes Pallor: No Rash: No Rubor: No Scarring: No Temperature / Pain Moisture Temperature: No Abnormality No Abnormalities Noted: No Tenderness on Palpation: Yes Dry / Scaly: Yes Maceration: No Moist: Yes Wound  Preparation Ulcer Cleansing: Rinsed/Irrigated with Saline Topical Anesthetic Applied: Other: lidocaine 4%, Treatment Notes Wound #5 (Left Calcaneus) 1. Cleansed with: Clean wound with Normal Saline 4. Dressing Applied: Iodoflex 5. Secondary Dressing Applied Gauze and Kerlix/Conform 7. Secured with Secretary/administrator) Signed: 09/07/2015 11:01:59 AM By: Elpidio Eric BSN, RN Entered By: Elpidio Eric on 09/07/2015 11:01:59 [name] ([id number]) -------------------------------------------------------------------------------- Wound Assessment Details Patient Name: [name] Date of Service: 09/07/2015 10:45 AM Medical Record Number: [id number] Patient Account Number: [id number] Date of Birth/Sex: [birth year] (82 y.o. Male) Treating RN: Clover Mealy, RN, BSN, Rita Primary Care Physician: Vonita Moss Other Clinician: Referring Physician: Vonita Moss Treating Physician/Extender: Altamese [place] in Treatment: 7 Wound Status Wound Number: 6R Primary Diabetic Wound/Ulcer of the Lower Etiology: Extremity Wound Location: Left Foot - Plantar Wound Open Wounding Event: Gradually Appeared Status: Date Acquired: 07/12/2015 Comorbid Hypertension, Type II Diabetes, History Weeks Of Treatment: 7 History: of pressure wounds, Osteoarthritis, Clustered Wound: No Neuropathy Photos Photo Uploaded By: Elpidio Eric on 09/07/2015 13:49:15 Wound Measurements Length: (cm) 0.5 Width: (cm) 1 Depth: (cm) 0.2 Area: (cm) 0.393 Volume: (cm) 0.079 % Reduction in Area: 87.5% % Reduction in Volume: 74.8% Epithelialization: Medium (34-66%) Tunneling: No Undermining: No Wound Description Classification: Grade 1 Wound Margin: Distinct, outline attached Exudate Amount: Large Exudate Type: Serosanguineous Exudate Color: red, brown Foul Odor After Cleansing: No Wound Bed Granulation Amount: Medium (34-66%) Exposed Structure Granulation Quality: Pink Fascia Exposed:  No Necrotic Amount: Small (1-33%) Fat Layer Exposed: No Necrotic Quality: Adherent Slough Tendon Exposed: No Hazell, Raziel K. ([id number]) Muscle Exposed: No Joint Exposed: No Bone Exposed: No Limited to Skin Breakdown Periwound Skin Texture Texture Color No Abnormalities Noted: No No Abnormalities Noted: No Callus: Yes Atrophie Blanche: No Crepitus: No Cyanosis: No Excoriation: No Ecchymosis: No Fluctuance: No Erythema: No Friable: No Hemosiderin Staining: No Induration: No Mottled: No Localized Edema: Yes Pallor: No Rash: No Rubor: No Scarring: No Temperature / Pain Moisture Temperature: No Abnormality No Abnormalities Noted: No Dry / Scaly: No Maceration: No Moist: Yes Wound Preparation Ulcer Cleansing: Rinsed/Irrigated with Saline Topical Anesthetic Applied: Other: lidocaine 4%, Treatment Notes Wound #6R (Left, Plantar Foot) 1. Cleansed with: Clean wound with Normal Saline 4. Dressing Applied: Iodoflex 5. Secondary Dressing Applied Gauze and Kerlix/Conform 7. Secured with Secretary/administrator) Signed: 09/07/2015 11:02:37 AM By: Elpidio Eric BSN, RN Entered By: Elpidio Eric on 09/07/2015 11:02:36 [name] ([id number]) -------------------------------------------------------------------------------- Wound Assessment Details Patient Name: [name] Date of Service: 09/07/2015 10:45 AM Medical Record Number: [id number]  Patient Account Number: [id number] Date of Birth/Sex: 25-Oct-1924 (82 y.o. Male) Treating RN: Afful, RN, BSN, Rita Primary Care Physician: Vonita Moss Other Clinician: Referring Physician: Vonita Moss Treating Physician/Extender: Altamese [place] in Treatment: 7 Wound Status Wound Number: 7 Primary Diabetic Wound/Ulcer of the Lower Etiology: Extremity Wound Location: Left Metatarsal head first Wound Open Wounding Event: Gradually Appeared Status: Date Acquired: 07/11/2015 Comorbid Hypertension, Type II  Diabetes, History Weeks Of Treatment: 7 History: of pressure wounds, Osteoarthritis, Clustered Wound: No Neuropathy Photos Photo Uploaded By: Elpidio Eric on 09/07/2015 13:49:30 Wound Measurements Length: (cm) 2.2 Width: (cm) 2.2 Depth: (cm) 0.2 Area: (cm) 3.801 Volume: (cm) 0.76 % Reduction in Area: -222.7% % Reduction in Volume: -484.6% Epithelialization: Small (1-33%) Tunneling: No Undermining: No Wound Description Classification: Grade 1 Wound Margin: Distinct, outline attached Exudate Amount: Medium Exudate Type: Serosanguineous Exudate Color: red, brown Foul Odor After Cleansing: No Wound Bed Granulation Amount: Small (1-33%) Exposed Structure Granulation Quality: Pink Fascia Exposed: No Necrotic Amount: Large (67-100%) Fat Layer Exposed: No Necrotic Quality: Adherent Slough Tendon Exposed: No Haupt, Jostin K. ([id number]) Muscle Exposed: No Joint Exposed: No Bone Exposed: No Limited to Skin Breakdown Periwound Skin Texture Texture Color No Abnormalities Noted: No No Abnormalities Noted: No Callus: Yes Atrophie Blanche: No Crepitus: No Cyanosis: No Excoriation: No Ecchymosis: No Fluctuance: No Erythema: No Friable: No Hemosiderin Staining: No Induration: No Mottled: No Localized Edema: Yes Pallor: No Rash: No Rubor: No Scarring: No Temperature / Pain Moisture Temperature: No Abnormality No Abnormalities Noted: No Tenderness on Palpation: Yes Dry / Scaly: No Maceration: Yes Moist: Yes Wound Preparation Ulcer Cleansing: Rinsed/Irrigated with Saline Topical Anesthetic Applied: Other: lidocaine 4%, Treatment Notes Wound #7 (Left Metatarsal head first) 1. Cleansed with: Clean wound with Normal Saline 4. Dressing Applied: Iodoflex 5. Secondary Dressing Applied Gauze and Kerlix/Conform 7. Secured with Secretary/administrator) Signed: 09/07/2015 11:03:05 AM By: Elpidio Eric BSN, RN Entered By: Elpidio Eric on 09/07/2015  11:03:04 [name] ([id number]) -------------------------------------------------------------------------------- Vitals Details Patient Name: [name] Date of Service: 09/07/2015 10:45 AM Medical Record Number: [id number] Patient Account Number: [id number] Date of Birth/Sex: [birth year] (82 y.o. Male) Treating RN: Clover Mealy, RN, BSN, Rita Primary Care Physician: Vonita Moss Other Clinician: Referring Physician: Vonita Moss Treating Physician/Extender: Altamese [place] in Treatment: 7 Vital Signs Time Taken: 10:57 Temperature (F): 97.7 Height (in): 72 Pulse (bpm): 72 Weight (lbs): 195 Respiratory Rate (breaths/min): 16 Body Mass Index (BMI): 26.4 Blood Pressure (mmHg): 133/67 Reference Range: 80 - 120 mg / dl Electronic Signature(s) Signed: 09/07/2015 4:00:39 PM By: Elpidio Eric BSN, RN Entered By: Elpidio Eric on 09/07/2015 10:57:59

## 2015-09-09 ENCOUNTER — Other Ambulatory Visit: Payer: Self-pay | Admitting: Cardiovascular Disease

## 2015-09-14 ENCOUNTER — Encounter: Payer: PPO | Admitting: Internal Medicine

## 2015-09-14 DIAGNOSIS — E11621 Type 2 diabetes mellitus with foot ulcer: Secondary | ICD-10-CM | POA: Diagnosis not present

## 2015-09-15 NOTE — Progress Notes (Signed)
ISAISH, ALEMU ([id number]) Visit Report for 09/14/2015 Chief Complaint Document Details Patient Name: [name], [name]. Date of Service: 09/14/2015 3:00 PM Medical Record Patient Account Number: [id number] Number: Treating RN: Baruch Gouty, RN, BSN, Velva Harman [birth year] [phone] y.o. Other Clinician: Date of Birth/Sex: Male) Treating Aziel Morgan Primary Care Physician: Golden Pop Physician/Extender: G Referring Physician: Golden Pop Weeks in Treatment: 8 Information Obtained from: Patient Chief Complaint Has had swelling of both lower extremities with some ulceration on the right lower extremity. 07/20/15 patient returns today predominantly for a painful wound on the plantar aspect of his left foot of recent onset. Electronic Signature(s) Signed: 09/14/2015 4:27:19 PM By: [name] MD Entered By: [name] on 09/14/2015 15:30:48 [place], [name] ([id number]) -------------------------------------------------------------------------------- Debridement Details Patient Name: Chris Bottoms K. Date of Service: 09/14/2015 3:00 PM Medical Record Patient Account Number: [id number] Number: Treating RN: Baruch Gouty, RN, BSN, Velva Harman 1924-07-30 [phone] y.o. Other Clinician: Date of Birth/Sex: Male) Treating Loreen Bankson, [place] Primary Care Physician: Jeananne Rama, [place]: G Referring Physician: Golden Pop Weeks in Treatment: 8 Debridement Performed for Wound #6R Left,Plantar Foot Assessment: Performed By: Physician [name], MD Debridement: Debridement Pre-procedure Yes Verification/Time Out Taken: Start Time: 15:20 Pain Control: Lidocaine 4% Topical Solution Level: Skin/Subcutaneous Tissue Total Area Debrided (L x 0.8 (cm) x 0.9 (cm) = 0.72 (cm) W): Tissue and other Non-Viable, Exudate, Fibrin/Slough, Subcutaneous material debrided: Instrument: Curette Bleeding: Minimum Hemostasis Achieved: Pressure End Time: 15:25 Procedural Pain:  0 Post Procedural Pain: 0 Response to Treatment: Procedure was tolerated well Post Debridement Measurements of Total Wound Length: (cm) 0.8 Width: (cm) 0.9 Depth: (cm) 0.2 Volume: (cm) 0.113 Post Procedure Diagnosis Same as Pre-procedure Electronic Signature(s) Signed: 09/14/2015 4:27:19 PM By: [name] MD Signed: 09/14/2015 4:32:43 PM By: Regan Lemming BSN, RN Entered By: [name] on 09/14/2015 15:30:19 [name], [name] ([id number]) -------------------------------------------------------------------------------- Debridement Details Patient Name: Chris Bottoms K. Date of Service: 09/14/2015 3:00 PM Medical Record Patient Account Number: [id number] Number: Treating RN: Baruch Gouty, RN, BSN, Velva Harman 1925/03/28 [phone] y.o. Other Clinician: Date of Birth/Sex: Male) Treating Geovanna Simko, [place] Primary Care Physician: Golden Pop Physician/Extender: G Referring Physician: Golden Pop Weeks in Treatment: 8 Debridement Performed for Wound #7 Left Metatarsal head first Assessment: Performed By: Physician [name], MD Debridement: Debridement Pre-procedure Yes Verification/Time Out Taken: Start Time: 15:16 Pain Control: Lidocaine 4% Topical Solution Level: Skin/Subcutaneous Tissue Total Area Debrided (L x 2.1 (cm) x 2.5 (cm) = 5.25 (cm) W): Tissue and other Non-Viable, Exudate, Fibrin/Slough, Subcutaneous material debrided: Instrument: Curette Bleeding: Minimum Hemostasis Achieved: Pressure End Time: 15:20 Procedural Pain: 0 Post Procedural Pain: 0 Response to Treatment: Procedure was tolerated well Post Debridement Measurements of Total Wound Length: (cm) 2.1 Width: (cm) 2.5 Depth: (cm) 0.2 Volume: (cm) 0.825 Post Procedure Diagnosis Same as Pre-procedure Electronic Signature(s) Signed: 09/14/2015 4:27:19 PM By: [name] MD Signed: 09/14/2015 4:32:43 PM By: Regan Lemming BSN, RN Entered By: [name] on 09/14/2015 15:30:32 [name],  [name] ([id number]) -------------------------------------------------------------------------------- HPI Details Patient Name: Chris Bottoms K. Date of Service: 09/14/2015 3:00 PM Medical Record Patient Account Number: [id number] Number: Treating RN: Baruch Gouty, RN, BSN, Velva Harman [birth year] [phone] y.o. Other Clinician: Date of Birth/Sex: Male) Treating Bracen Schum Primary Care Physician: Golden Pop Physician/Extender: G Referring Physician: Golden Pop Weeks in Treatment: 8 History of Present Illness Location: had swelling of the right lower extremity with some ulceration on the medial part of his calf and on the right fifth toe Quality: Patient reports No Pain. Severity: Patient  states wound (s) are getting better. Duration: Patient has had the wound for < 2 weeks prior to presenting for treatment Context: The wound appeared gradually over time. nail on his right fifth toe came off loose and he pried it Modifying Factors: Other treatment(s) tried include:vascular workup where he is going to be seeing Dr. Lucky Cowboy on Monday Associated Signs and Symptoms: Patient reports presence of swelling HPI Description: Very pleasant 82 year old with a past medical history significant for type 2 diabetes. He underwent left knee surgery in February 2015. He had an angioplasty of his R posterior tibial on 10/30/13. Vascular workup done in April of this year showed an ABI on the right to be noncompressible and the left to be 0.86. About a week ago he noticed some swelling on the right lower extremity and then an ulceration on the medial part of his calf. He also noted that the right fifth nail bed had a loose nail and he pried it open and now has an open wound. He has no evidence of cellulitis. he was seen by the nurse practitioner at his PCPs office and put on Bactroban ointment and doxycycline for 10 days. 01/21/2015 -- he was seen by Dr. Ronalee Belts and though we do not have the note, we know he  had used an Unna's boot and asked him to come to see as again for a compression bandage. The patient has no fresh complaints. 07/20/15; the patient returns today predominantly for her wounds on his left plantar foot. He was seen here in the summer last on 8/19 for wounds on his right medial calf and right fifth nail bed. I don't know that he actually returned to be discharged, he is wearing compression on bilateral legs. He has known peripheral arterial disease in the setting of type 2 diabetes having an angioplasty on the right posterior tibial artery on 10/30/13. His ABI calculated in the clinic today was 0.97 on the right. I don't think the left could be done. The patient tells me that roughly a week ago he started to develop pain in the plantar aspect of his left foot. I think his caregiver noticed the wounds and he is here for our review events. He is minimally ambulatory, spends most of his time in a wheelchair. I am not certain of the issue here. 07/27/15; the patient is here for 3 wounds. The first over his left first met head, second over the left medial heel and the third over the left fourth metatarsal head. Culture from the fourth metatarsal head last week showed Klebsiella. This would not be sensitive to the doxycycline possibly I have therefore changed him to Keflex to which the Klebsiella should be sensitive 08/10/15; patient returns today. He was not seen last week. He does have home health. The area over the left fourth metatarsal head is almost completely closed the area over the medial aspect of his left first metatarsal head and the left medial heel still required debridement [place], Burdell K. ([id number]) 08/17/15 the area over the fourth metatarsal head has resolved. The area on his left first metatarsal head lateral aspect and the left medial heel still require surgical debridement although the surface is beginning to look a bit better. Is followed with vascular surgery and is  apparently being planned for an angiogram and possible stent on 3/28 08/23/15 the patient complained of pain over his fourth metatarsal head, the previously closed wound. Physical exam did not really show a source he had a small amount of  callus which I lightly removed to expose a large amount of purulent drainage. The area was then fully debridement the area on the first metatarsal head lateral aspect and left medial heel appear improved [Iodosorb] 08/31/15; the patient is completed a well-tolerated angioplasty of the left posterior tibial artery. The left common femoral profunda femoris and superficial femoral arteries are diffusely diseased but there was no hemodynamically significant lesion. Culture I did last week from the left fourth metatarsal head enterococcus I'm going to give him another week of Keflex which should cover this. Patient states he had some pain but it seems to have resolved in the sole of his foot 09/07/15 he has completed his Keflex yesterday. 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 Using Iodoflex. He is completed antibiotics. Electronic Signature(s) Signed: 09/14/2015 4:27:19 PM By: [name] MD Entered By: [name] on 09/14/2015 15:33:15 [place], [name] ([id number]) -------------------------------------------------------------------------------- Physical Exam Details Patient Name: Chris Bottoms K. Date of Service: 09/14/2015 3:00 PM Medical Record Patient Account Number: [id number] Number: Treating RN: Baruch Gouty, RN, BSN, Velva Harman 03-28-1925 [phone] y.o. Other Clinician: Date of Birth/Sex: Male) Treating Maykayla Highley Primary Care Physician: Golden Pop Physician/Extender: G Referring Physician: Golden Pop Weeks in Treatment: 8 Notes Wound exam; the left fourth metatarsal head appears stable, debridement of circumferential  callus and nonviable subcutaneous tissue. Continued worrisome wound around the lateral aspect of his left first metatarsal head is also debridement although the base of this appears to be stable. The wound on the lateral aspect of his heel was small no debridement was required the base of this does not appear to be unhealthy Electronic Signature(s) Signed: 09/14/2015 4:27:19 PM By: [name] MD Entered By: [name] on 09/14/2015 15:34:11 [name], [name] ([id number]) -------------------------------------------------------------------------------- Physician Orders Details Patient Name: Chris Bottoms K. Date of Service: 09/14/2015 3:00 PM Medical Record Patient Account Number: [id number] Number: Treating RN: Baruch Gouty, RN, BSN, Velva Harman 1925-01-30 [phone] y.o. Other Clinician: Date of Birth/Sex: Male) Treating Aymar Whitfill, [place] Primary Care Physician: Golden Pop Physician/Extender: G Referring Physician: Golden Pop Weeks in Treatment: 8 Verbal / Phone Orders: Yes Clinician: Afful, RN, BSN, Rita Read Back and Verified: Yes Diagnosis Coding Wound Cleansing Wound #5 Left Calcaneus o Cleanse wound with mild soap and water Wound #6R Left,Plantar Foot o Cleanse wound with mild soap and water Wound #7 Left Metatarsal head first o Cleanse wound with mild soap and water Anesthetic Wound #5 Left Calcaneus o Topical Lidocaine 4% cream applied to wound bed prior to debridement Wound #6R Left,Plantar Foot o Topical Lidocaine 4% cream applied to wound bed prior to debridement Wound #7 Left Metatarsal head first o Topical Lidocaine 4% cream applied to wound bed prior to debridement Primary Wound Dressing Wound #5 Left Calcaneus o Iodoflex Wound #6R Left,Plantar Foot o Iodoflex Wound #7 Left Metatarsal head first o Iodoflex Secondary Dressing Wound #5 Left Calcaneus o Gauze and Kerlix/Conform Wound #6R Left,Plantar Foot [name], [name]  ([id number]) o Gauze and Kerlix/Conform Wound #7 Left Metatarsal head first o Gauze and Kerlix/Conform Dressing Change Frequency Wound #5 Left Calcaneus o Change dressing every day. Wound #6R Left,Plantar Foot o Change dressing every day. Wound #7 Left Metatarsal head first o Change dressing every day. Follow-up Appointments Wound #5 Left Calcaneus o Return Appointment in 1 week. Wound #6R Left,Plantar Foot o Return Appointment in 1 week. Wound #7  Left Metatarsal head first o Return Appointment in 1 week. Off-Loading Wound #5 Left Calcaneus o Turn and reposition every 2 hours Wound #6R Left,Plantar Foot o Turn and reposition every 2 hours Wound #7 Left Metatarsal head first o Turn and reposition every 2 hours Additional Orders / Instructions Wound #5 Left Calcaneus o Increase protein intake. o Activity as tolerated Wound #6R Left,Plantar Foot o Increase protein intake. o Activity as tolerated Wound #7 Left Metatarsal head first o Increase protein intake. o Activity as tolerated [name], [name] ([id number]) Home Health Wound #5 Left Calcaneus o Continue Home Health Visits - [place] Nurse may visit PRN to address patientos wound care needs. o FACE TO FACE ENCOUNTER: MEDICARE and MEDICAID PATIENTS: I certify that this patient is under my care and that I had a face-to-face encounter that meets the physician face-to-face encounter requirements with this patient on this date. The encounter with the patient was in whole or in part for the following MEDICAL CONDITION: (primary reason for [place]) MEDICAL NECESSITY: I certify, that based on my findings, NURSING services are a medically necessary home health service. HOME BOUND STATUS: I certify that my clinical findings support that this patient is homebound (i.e., Due to illness or injury, pt requires aid of supportive devices such as crutches, cane, wheelchairs,  walkers, the use of special transportation or the assistance of another person to leave their place of residence. There is a normal inability to leave the home and doing so requires considerable and taxing effort. Other absences are for medical reasons / religious services and are infrequent or of short duration when for other reasons). o If current dressing causes regression in wound condition, may D/C ordered dressing product/s and apply Normal Saline Moist Dressing daily until next [place] / Other MD appointment. [place] of regression in wound condition at [phone]. o Please direct any NON-WOUND related issues/requests for orders to patient's Primary Care Physician Wound #6R [place] Nurse may visit PRN to address patientos wound care needs. o FACE TO FACE ENCOUNTER: MEDICARE and MEDICAID PATIENTS: I certify that this patient is under my care and that I had a face-to-face encounter that meets the physician face-to-face encounter requirements with this patient on this date. The encounter with the patient was in whole or in part for the following MEDICAL CONDITION: (primary reason for [place]) MEDICAL NECESSITY: I certify, that based on my findings, NURSING services are a medically necessary home health service. HOME BOUND STATUS: I certify that my clinical findings support that this patient is homebound (i.e., Due to illness or injury, pt requires aid of supportive devices such as crutches, cane, wheelchairs, walkers, the use of special transportation or the assistance of another person to leave their place of residence. There is a normal inability to leave the home and doing so requires considerable and taxing effort. Other absences are for medical reasons / religious services and are infrequent or of short duration when for other reasons). o If current dressing causes regression  in wound condition, may D/C ordered dressing product/s and apply Normal Saline Moist Dressing daily until next [place] / Other MD appointment. [place] of regression in wound condition at [phone]. o Please direct any NON-WOUND related issues/requests for orders to patient's Primary Care Physician Wound #7 Left Metatarsal head first o [place] Nurse may visit PRN to address  patientos wound care needs. o FACE TO FACE ENCOUNTER: MEDICARE and MEDICAID PATIENTS: I certify that this patient is under my care and that I had a face-to-face encounter that meets the physician face-to-face encounter requirements with this patient on this date. The encounter with the patient was in whole or in part for the following MEDICAL CONDITION: (primary reason for [place]) [name], [name] ([id number]) MEDICAL NECESSITY: I certify, that based on my findings, NURSING services are a medically necessary home health service. HOME BOUND STATUS: I certify that my clinical findings support that this patient is homebound (i.e., Due to illness or injury, pt requires aid of supportive devices such as crutches, cane, wheelchairs, walkers, the use of special transportation or the assistance of another person to leave their place of residence. There is a normal inability to leave the home and doing so requires considerable and taxing effort. Other absences are for medical reasons / religious services and are infrequent or of short duration when for other reasons). o If current dressing causes regression in wound condition, may D/C ordered dressing product/s and apply Normal Saline Moist Dressing daily until next [place] / Other MD appointment. [place] of regression in wound condition at [phone]. o Please direct any NON-WOUND related issues/requests for orders to patient's Primary  Care Physician Electronic Signature(s) Signed: 09/14/2015 4:27:19 PM By: [name] MD Signed: 09/14/2015 4:32:43 PM By: Regan Lemming BSN, RN Entered By: Regan Lemming on 09/14/2015 15:24:08 [name], [name] ([id number]) -------------------------------------------------------------------------------- Problem List Details Patient Name: Chris Bottoms K. Date of Service: 09/14/2015 3:00 PM Medical Record Patient Account Number: [id number] Number: Treating RN: Baruch Gouty, RN, BSN, Velva Harman 04-11-1925 [phone] y.o. 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/20/2015 Yes layer exposed L97.511 Non-pressure chronic ulcer of other part of right foot 07/20/2015 Yes limited to breakdown of skin E11.621 Type 2 diabetes mellitus with foot ulcer 07/20/2015 Yes E11.51 Type 2 diabetes mellitus with diabetic peripheral 07/20/2015 Yes angiopathy without gangrene Inactive Problems Resolved Problems Electronic Signature(s) Signed: 09/14/2015 4:27:19 PM By: [name] MD Entered By: [name] on 09/14/2015 15:30:07 Stall[place], [name] ([id number]) -------------------------------------------------------------------------------- Progress Note Details Patient Name: Chris Bottoms K. Date of Service: 09/14/2015 3:00 PM Medical Record Patient Account Number: [id number] Number: Treating RN: Baruch Gouty, RN, BSN, Velva Harman [birth year] [phone] y.o. Other Clinician: Date of Birth/Sex: Male) Treating Krishay Faro Primary Care Physician: Golden Pop Physician/Extender: G Referring Physician: Golden Pop Weeks in Treatment: 8 Subjective Chief Complaint Information obtained from Patient Has had swelling of both  lower extremities with some ulceration on the right lower extremity. 07/20/15 patient returns today predominantly for a painful wound on the plantar aspect of his left foot of recent onset. History of Present Illness (HPI) The following HPI elements were documented for the patient's wound: Location: had swelling of the right lower extremity with some ulceration on the medial part of his calf and on the right fifth toe Quality: Patient reports No Pain. Severity: Patient states wound (s) are getting better. Duration: Patient has had the wound for < 2 weeks prior to presenting for treatment Context: The wound appeared gradually over time. nail on his right fifth toe came off loose and he pried it Modifying Factors: Other treatment(s) tried include:vascular workup where he is  going to be seeing Dr. Lucky Cowboy on Monday Associated Signs and Symptoms: Patient reports presence of swelling Very pleasant 82 year old with a past medical history significant for type 2 diabetes. He underwent left knee surgery in February 2015. He had an angioplasty of his R posterior tibial on 10/30/13. Vascular workup done in April of this year showed an ABI on the right to be noncompressible and the left to be 0.86. About a week ago he noticed some swelling on the right lower extremity and then an ulceration on the medial part of his calf. He also noted that the right fifth nail bed had a loose nail and he pried it open and now has an open wound. He has no evidence of cellulitis. he was seen by the nurse practitioner at his PCPs office and put on Bactroban ointment and doxycycline for 10 days. 01/21/2015 -- he was seen by Dr. Ronalee Belts and though we do not have the note, we know he had used an Unna's boot and asked him to come to see as again for a compression bandage. The patient has no fresh complaints. 07/20/15; the patient returns today predominantly for her wounds on his left plantar foot. He was seen here in the summer  last on 8/19 for wounds on his right medial calf and right fifth nail bed. I don't know that he actually returned to be discharged, he is wearing compression on bilateral legs. He has known peripheral arterial disease in the setting of type 2 diabetes having an angioplasty on the right posterior tibial artery on 10/30/13. His ABI calculated in the clinic today was 0.97 on the right. I don't think the left could be done. The patient tells me that roughly a week ago he started to develop pain in the plantar aspect of his left foot. I Kath, [name] ([id number]) think his caregiver noticed the wounds and he is here for our review events. He is minimally ambulatory, spends most of his time in a wheelchair. I am not certain of the issue here. 07/27/15; the patient is here for 3 wounds. The first over his left first met head, second over the left medial heel and the third over the left fourth metatarsal head. Culture from the fourth metatarsal head last week showed Klebsiella. This would not be sensitive to the doxycycline possibly I have therefore changed him to Keflex to which the Klebsiella should be sensitive 08/10/15; patient returns today. He was not seen last week. He does have home health. The area over the left fourth metatarsal head is almost completely closed the area over the medial aspect of his left first metatarsal head and the left medial heel still required debridement 08/17/15 the area over the fourth metatarsal head has resolved. The area on his left first metatarsal head lateral aspect and the left medial heel still require surgical debridement although the surface is beginning to look a bit better. Is followed with vascular surgery and is apparently being planned for an angiogram and possible stent on 3/28 08/23/15 the patient complained of pain over his fourth metatarsal head, the previously closed wound. Physical exam did not really show a source he had a small amount of callus which  I lightly removed to expose a large amount of purulent drainage. The area was then fully debridement the area on the first metatarsal head lateral aspect and left medial heel appear improved [Iodosorb] 08/31/15; the patient is completed a well-tolerated angioplasty of the left posterior tibial artery. The left common femoral  profunda femoris and superficial femoral arteries are diffusely diseased but there was no hemodynamically significant lesion. Culture I did last week from the left fourth metatarsal head enterococcus I'm going to give him another week of Keflex which should cover this. Patient states he had some pain but it seems to have resolved in the sole of his foot 09/07/15 he has completed his Keflex yesterday. 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 Using Iodoflex. He is completed antibiotics. Objective Constitutional Vitals Time Taken: 2:55 PM, Height: 72 in, Weight: 195 lbs, BMI: 26.4, Pulse: 63 bpm, Respiratory Rate: 16 breaths/min, Blood Pressure: 137/64 mmHg. Integumentary (Hair, Skin) Wound #5 status is Open. Original cause of wound was Blister. The wound is located on the Left Calcaneus. The wound measures 1cm length x 1cm width x 0.2cm depth; 0.785cm^2 area and 0.157cm^3 volume. The wound is limited to skin breakdown. There is no tunneling or undermining noted. There is a small amount of serosanguineous drainage noted. The wound margin is distinct with the outline attached to the wound base. There is small (1-33%) red, pink granulation within the wound bed. There is a medium (34- 66%) amount of necrotic tissue within the wound bed including Adherent Slough. The periwound skin appearance exhibited: Localized Edema, Dry/Scaly, Moist. The periwound skin appearance did not exhibit: Callus, Crepitus, Excoriation, Fluctuance, Friable, Induration, Rash, Scarring,  Maceration, Atrophie Blanche, Cyanosis, Ecchymosis, Hemosiderin Staining, Mottled, Pallor, Rubor, Erythema. Periwound temperature was noted as No Abnormality. The periwound has tenderness on palpation. [name], [name]. ([id number]) Wound #6R status is Open. Original cause of wound was Gradually Appeared. The wound is located on the [place]. The wound measures 0.8cm length x 0.9cm width x 0.2cm depth; 0.565cm^2 area and 0.113cm^3 volume. The wound is limited to skin breakdown. There is no tunneling or undermining noted. There is a large amount of serosanguineous drainage noted. The wound margin is distinct with the outline attached to the wound base. There is medium (34-66%) pink granulation within the wound bed. There is a small (1-33%) amount of necrotic tissue within the wound bed including Adherent Slough. The periwound skin appearance exhibited: Callus, Localized Edema, Moist. The periwound skin appearance did not exhibit: Crepitus, Excoriation, Fluctuance, Friable, Induration, Rash, Scarring, Dry/Scaly, Maceration, Atrophie Blanche, Cyanosis, Ecchymosis, Hemosiderin Staining, Mottled, Pallor, Rubor, Erythema. Periwound temperature was noted as No Abnormality. Wound #7 status is Open. Original cause of wound was Gradually Appeared. The wound is located on the Left Metatarsal head first. The wound measures 2.1cm length x 2.5cm width x 0.2cm depth; 4.123cm^2 area and 0.825cm^3 volume. The wound is limited to skin breakdown. There is no tunneling or undermining noted. There is a medium amount of serosanguineous drainage noted. The wound margin is distinct with the outline attached to the wound base. There is small (1-33%) pink granulation within the wound bed. There is a large (67-100%) amount of necrotic tissue within the wound bed including Adherent Slough. The periwound skin appearance exhibited: Callus, Localized Edema, Maceration, Moist. The periwound skin appearance did not  exhibit: Crepitus, Excoriation, Fluctuance, Friable, Induration, Rash, Scarring, Dry/Scaly, Atrophie Blanche, Cyanosis, Ecchymosis, Hemosiderin Staining, Mottled, Pallor, Rubor, Erythema. Periwound temperature was noted as No Abnormality. The periwound has tenderness on palpation. Assessment Active Problems ICD-10 L97.522 - Non-pressure chronic ulcer of other part of left foot with fat layer exposed L97.511 - Non-pressure chronic ulcer of other  part of right foot limited to breakdown of skin E11.621 - Type 2 diabetes mellitus with foot ulcer E11.51 - Type 2 diabetes mellitus with diabetic peripheral angiopathy without gangrene Procedures Wound #6R Wound #6R is a Diabetic Wound/Ulcer of the Lower Extremity located on the [place] . There was a Skin/Subcutaneous Tissue Debridement ([id number]) debridement with total area of 0.72 sq cm performed by [name], MD. with the following instrument(s): Curette to remove Non-Viable tissue/material including Exudate, Fibrin/Slough, and Subcutaneous after achieving pain control using Lidocaine 4% Topical Solution. A time out was conducted prior to the start of the procedure. A Minimum amount of bleeding was controlled with Pressure. The procedure was tolerated well with a pain level of 0 [name], [name] ([id number]) throughout and a pain level of 0 following the procedure. Post Debridement Measurements: 0.8cm length x 0.9cm width x 0.2cm depth; 0.113cm^3 volume. Post procedure Diagnosis Wound #6R: Same as Pre-Procedure Wound #7 Wound #7 is a Diabetic Wound/Ulcer of the Lower Extremity located on the Left Metatarsal head first . There was a Skin/Subcutaneous Tissue Debridement ([id number]) debridement with total area of 5.25 sq cm performed by [name], MD. with the following instrument(s): Curette to remove Non- Viable tissue/material including Exudate, Fibrin/Slough, and Subcutaneous after achieving pain control using  Lidocaine 4% Topical Solution. A time out was conducted prior to the start of the procedure. A Minimum amount of bleeding was controlled with Pressure. The procedure was tolerated well with a pain level of 0 throughout and a pain level of 0 following the procedure. Post Debridement Measurements: 2.1cm length x 2.5cm width x 0.2cm depth; 0.825cm^3 volume. Post procedure Diagnosis Wound #7: Same as Pre-Procedure Plan Wound Cleansing: Wound #5 Left Calcaneus: Cleanse wound with mild soap and water Wound #6R Left,Plantar Foot: Cleanse wound with mild soap and water Wound #7 Left Metatarsal head first: Cleanse wound with mild soap and water Anesthetic: Wound #5 Left Calcaneus: Topical Lidocaine 4% cream applied to wound bed prior to debridement Wound #6R Left,Plantar Foot: Topical Lidocaine 4% cream applied to wound bed prior to debridement Wound #7 Left Metatarsal head first: Topical Lidocaine 4% cream applied to wound bed prior to debridement Primary Wound Dressing: Wound #5 Left Calcaneus: Iodoflex Wound #6R Left,Plantar Foot: Iodoflex Wound #7 Left Metatarsal head first: Iodoflex Secondary Dressing: Wound #5 Left Calcaneus: Gauze and Kerlix/Conform Wound #6R Left,Plantar Foot: Gauze and Kerlix/Conform Wound #7 Left Metatarsal head first: [place], Juandedios K. ([id number]) Gauze and Kerlix/Conform Dressing Change Frequency: Wound #5 Left Calcaneus: Change dressing every day. Wound #6R Left,Plantar Foot: Change dressing every day. Wound #7 Left Metatarsal head first: Change dressing every day. Follow-up Appointments: Wound #5 Left Calcaneus: Return Appointment in 1 week. Wound #6R Left,Plantar Foot: Return Appointment in 1 week. Wound #7 Left Metatarsal head first: Return Appointment in 1 week. Off-Loading: Wound #5 Left Calcaneus: Turn and reposition every 2 hours Wound #6R Left,Plantar Foot: Turn and reposition every 2 hours Wound #7 Left Metatarsal head first: Turn  and reposition every 2 hours Additional Orders / Instructions: Wound #5 Left Calcaneus: Increase protein intake. Activity as tolerated Wound #6R Left,Plantar Foot: Increase protein intake. Activity as tolerated Wound #7 Left Metatarsal head first: Increase protein intake. Activity as tolerated Home Health: Wound #5 Left Calcaneus: Continue Home Health Visits - [place] Nurse may visit PRN to address patient s wound care needs. FACE TO FACE ENCOUNTER: MEDICARE and MEDICAID PATIENTS: I certify that this patient is under my care and that I had  a face-to-face encounter that meets the physician face-to-face encounter requirements with this patient on this date. The encounter with the patient was in whole or in part for the following MEDICAL CONDITION: (primary reason for [place]) MEDICAL NECESSITY: I certify, that based on my findings, NURSING services are a medically necessary home health service. HOME BOUND STATUS: I certify that my clinical findings support that this patient is homebound (i.e., Due to illness or injury, pt requires aid of supportive devices such as crutches, cane, wheelchairs, walkers, the use of special transportation or the assistance of another person to leave their place of residence. There is a normal inability to leave the home and doing so requires considerable and taxing effort. Other absences are for medical reasons / religious services and are infrequent or of short duration when for other reasons). If current dressing causes regression in wound condition, may D/C ordered dressing product/s and apply Normal Saline Moist Dressing daily until next [place] / Other MD appointment. [place] of regression in wound condition at [phone]. Please direct any NON-WOUND related issues/requests for orders to patient's Primary Care Physician Wound #6R Left,Plantar Foot: ARJUNA, DOEDEN ([id number]) [place] Nurse may visit PRN to address patient s wound care needs. FACE TO FACE ENCOUNTER: MEDICARE and MEDICAID PATIENTS: I certify that this patient is under my care and that I had a face-to-face encounter that meets the physician face-to-face encounter requirements with this patient on this date. The encounter with the patient was in whole or in part for the following MEDICAL CONDITION: (primary reason for [place]) MEDICAL NECESSITY: I certify, that based on my findings, NURSING services are a medically necessary home health service. HOME BOUND STATUS: I certify that my clinical findings support that this patient is homebound (i.e., Due to illness or injury, pt requires aid of supportive devices such as crutches, cane, wheelchairs, walkers, the use of special transportation or the assistance of another person to leave their place of residence. There is a normal inability to leave the home and doing so requires considerable and taxing effort. Other absences are for medical reasons / religious services and are infrequent or of short duration when for other reasons). If current dressing causes regression in wound condition, may D/C ordered dressing product/s and apply Normal Saline Moist Dressing daily until next [place] / Other MD appointment. [place] of regression in wound condition at [phone]. Please direct any NON-WOUND related issues/requests for orders to patient's Primary Care Physician Wound #7 Left Metatarsal head first: [place] Nurse may visit PRN to address patient s wound care needs. FACE TO FACE ENCOUNTER: MEDICARE and MEDICAID PATIENTS: I certify that this patient is under my care and that I had a face-to-face encounter that meets the physician face-to-face encounter requirements with this patient on this date. The encounter with the patient was in whole or in part for the following MEDICAL  CONDITION: (primary reason for [place]) MEDICAL NECESSITY: I certify, that based on my findings, NURSING services are a medically necessary home health service. HOME BOUND STATUS: I certify that my clinical findings support that this patient is homebound (i.e., Due to illness or injury, pt requires aid of supportive devices such as crutches, cane, wheelchairs, walkers, the use of special transportation or the assistance of another person to leave their place of residence. There is a normal inability to leave the home and doing so requires  considerable and taxing effort. Other absences are for medical reasons / religious services and are infrequent or of short duration when for other reasons). If current dressing causes regression in wound condition, may D/C ordered dressing product/s and apply Normal Saline Moist Dressing daily until next [place] / Other MD appointment. [place] of regression in wound condition at [phone]. Please direct any NON-WOUND related issues/requests for orders to patient's Primary Care Physician #1 I am continuing with the Iodoflex #2 if he does not have osteomyelitis consider Apligraf or Dermagraft #3 is no obvious infection currently in any of these wounds however the reopening of the area over the left fourth metatarsal head was clearly related to underlying infection. The very concerning phenomenon. #4 he has known PAD over this does not appear to be the primary issue here #5 he is barely mobile in his own home spends most the time and better in a wheelchair. It sounds as though from talking with him and his care attendant the only time he is on his feet is when he is doing difficult transfers CAMIL, WILHELMSEN ([id number]) Electronic Signature(s) Signed: 09/14/2015 4:27:19 PM By: [name] MD Entered By: [name] on 09/14/2015 15:37:02 Whisner, Rylyn K.  ([id number]) -------------------------------------------------------------------------------- SuperBill Details Patient Name: Chris Bottoms K. Date of Service: 09/14/2015 Medical Record Patient Account Number: [id number] Number: Treating RN: Baruch Gouty, RN, BSN, Rita 1925/03/10 [phone] y.o. Other Clinician: Date of Birth/Sex: Male) Treating Dawid Dupriest, [place] Primary Care Physician: Golden Pop Physician/Extender: G Referring Physician: Golden Pop Weeks in Treatment: 8 Diagnosis Coding ICD-10 Codes Code Description [phone] Non-pressure chronic ulcer of other part of left foot with fat layer exposed L97.511 Non-pressure chronic ulcer of other part of right foot limited to breakdown of skin E11.621 Type 2 diabetes mellitus with foot ulcer E11.51 Type 2 diabetes mellitus with diabetic peripheral angiopathy without gangrene Facility Procedures CPT4 Code Description: [id number] 11042 - DEB SUBQ TISSUE 20 SQ CM/< ICD-10 Description Diagnosis L97.522 Non-pressure chronic ulcer of other part of left foot Modifier: with fat lay Quantity: 1 er exposed Physician Procedures CPT4 Code Description: [id number] 11042 - WC PHYS SUBQ TISS 20 SQ CM ICD-10 Description Diagnosis L97.522 Non-pressure chronic ulcer of other part of left foot Modifier: with fat laye Quantity: 1 r exposed Electronic Signature(s) Signed: 09/14/2015 4:27:19 PM By: [name] MD Entered By: [name] on 09/14/2015 15:37:25

## 2015-09-15 NOTE — Progress Notes (Signed)
[name], [name] ([id number]) Visit Report for 09/14/2015 Arrival Information Details Patient Name: [name], [name] Date of Service: 09/14/2015 3:00 PM Medical Record Number: [id number] Patient Account Number: [id number] Date of Birth/Sex: 1924/08/30 [age] y.o. Male) Treating RN: Clover MealyAfful, RN, BSN, [place] Sinkita Primary Care Physician: Vonita MossRISSMAN, MARK Other Clinician: Referring Physician: Vonita MossRISSMAN, MARK Treating Physician/Extender: Altamese CarolinaOBSON, MICHAEL G Weeks in Treatment: 8 Visit Information History Since Last Visit Added or deleted any medications: No Patient Arrived: Wheel Chair Any new allergies or adverse reactions: No Arrival Time: 14:51 Had a fall or experienced change in No activities of daily living that may affect Accompanied By: son risk of falls: Transfer Assistance: None Signs or symptoms of abuse/neglect since last No Patient Identification Verified: Yes visito Secondary Verification Process Yes Hospitalized since last visit: No Completed: Has Dressing in Place as Prescribed: Yes Patient Requires Transmission-Based No Pain Present Now: No Precautions: Patient Has Alerts: No Electronic Signature(s) Signed: 09/14/2015 4:32:43 PM By: Elpidio EricAfful, Rita BSN, RN Entered By: Elpidio EricAfful, Rita on 09/14/2015 14:52:02 [name], [name] ([id number]) -------------------------------------------------------------------------------- Encounter Discharge Information Details Patient Name: Chris RidingHENSLEY, [name] Date of Service: 09/14/2015 3:00 PM Medical Record Number: [id number] Patient Account Number: [id number] Date of Birth/Sex: 1924/08/30 [age] y.o. Male) Treating RN: Clover MealyAfful, RN, BSN, [place] Sinkita Primary Care Physician: Vonita MossRISSMAN, MARK Other Clinician: Referring Physician: Vonita MossRISSMAN, MARK Treating Physician/Extender: Altamese CarolinaOBSON, MICHAEL G Weeks in Treatment: 8 Encounter Discharge Information Items Discharge Pain Level: 0 Discharge Condition: Stable Ambulatory Status: Wheelchair Discharge Destination:  Home Transportation: Private Auto Accompanied By: caregiver Schedule Follow-up Appointment: No Medication Reconciliation completed and provided to Patient/Care No Lisha Vitale: Provided on Clinical Summary of Care: 09/14/2015 Form Type [facility]ip[facility] Signed: 09/14/2015 3:34:05 PM By: Gwenlyn PerkingMoore, Shelia Entered By: Gwenlyn PerkingMoore, Shelia on 09/14/2015 15:34:04 [name], [name] ([id number]) -------------------------------------------------------------------------------- Lower Extremity Assessment Details Patient Name: Chris RidingHENSLEY, [name] Date of Service: 09/14/2015 3:00 PM Medical Record Number: [id number] Patient Account Number: [id number] Date of Birth/Sex: 1924/08/30 [age] y.o. Male) Treating RN: Clover MealyAfful, RN, BSN, [place] Sinkita Primary Care Physician: Vonita MossRISSMAN, MARK Other Clinician: Referring Physician: Vonita MossRISSMAN, MARK Treating Physician/Extender: Altamese CarolinaOBSON, MICHAEL G Weeks in Treatment: 8 Vascular Assessment Pulses: Posterior Tibial Dorsalis Pedis Palpable: [Left:No] Doppler: [Left:Monophasic] Extremity colors, hair growth, and conditions: Extremity Color: [Left:Mottled] Hair Growth on Extremity: [Left:No] Temperature of Extremity: [Left:Warm] Capillary Refill: [Left:< 3 seconds] Toe Nail Assessment Left: Right: Thick: Yes Discolored: Yes Deformed: No Improper Length and Hygiene: No Electronic Signature(s) Signed: 09/14/2015 4:32:43 PM By: Elpidio EricAfful, Rita BSN, RN Entered By: Elpidio EricAfful, Rita on 09/14/2015 14:52:43 [name], [name] ([id number]) -------------------------------------------------------------------------------- Multi Wound Chart Details Patient Name: Chris RidingHENSLEY, [name] Date of Service: 09/14/2015 3:00 PM Medical Record Number: [id number] Patient Account Number: [id number] Date of Birth/Sex: 1924/08/30 [age] y.o. Male) Treating RN: Clover MealyAfful, RN, BSN, [place] Sinkita Primary Care Physician: Vonita MossRISSMAN, MARK Other Clinician: Referring Physician: Vonita MossRISSMAN, MARK Treating  Physician/Extender: Maxwell CaulOBSON, MICHAEL G Weeks in Treatment: 8 Vital Signs Height(in): 72 Pulse(bpm): 63 Weight(lbs): 195 Blood Pressure 137/64 (mmHg): Body Mass Index(BMI): 26 Temperature(F): Respiratory Rate 16 (breaths/min): Photos: [5:No Photos] [6R:No Photos] [7:No Photos] Wound Location: [5:Left Calcaneus] [6R:Left Foot - Plantar] [7:Left Metatarsal head first] Wounding Event: [5:Blister] [6R:Gradually Appeared] [7:Gradually Appeared] 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] [6R:the Lower Extremity] [7:the Lower Extremity] Comorbid History: [5:Hypertension, Type II Diabetes, History of pressure wounds, Osteoarthritis, Neuropathy Osteoarthritis, Neuropathy Osteoarthritis, Neuropathy] [6R:Hypertension, Type II Diabetes, History of pressure wounds,] [7:Hypertension, Type II  Diabetes, History of pressure wounds,] Date Acquired: [5:07/11/2015] [6R:07/12/2015] [7:07/11/2015] Weeks of Treatment: [  5:8] [6R:8] [7:8] Wound Status: [5:Open] [6R:Open] [7:Open] Wound Recurrence: [5:No] [6R:Yes] [7:No] Measurements L x W x D 1x1x0.2 [6R:0.8x0.9x0.2] [7:2.1x2.5x0.2] (cm) Area (cm) : [5:0.785] [6R:0.565] [7:4.123] Volume (cm) : [5:0.157] [6R:0.113] [7:0.825] % Reduction in Area: [5:90.90%] [6R:82.00%] [7:-250.00%] % Reduction in Volume: 81.80% [6R:64.00%] [7:-534.60%] Classification: [5:Grade 1] [6R:Grade 1] [7:Grade 1] Exudate Amount: [5:Small] [6R:Large] [7:Medium] Exudate Type: [5:Serosanguineous] [6R:Serosanguineous] [7:Serosanguineous] Exudate Color: [5:red, brown] [6R:red, brown] [7:red, brown] Wound Margin: [5:Distinct, outline attached Distinct, outline attached Distinct, outline attached] Granulation Amount: [5:Small (1-33%)] [6R:Medium (34-66%)] [7:Small (1-33%)] Granulation Quality: [5:Red, Pink] [6R:Pink] [7:Pink] Necrotic Amount: [5:Medium (34-66%)] [6R:Small (1-33%)] [7:Large (67-100%)] Exposed  Structures: [5:Fascia: No Fat: No Tendon: No] [6R:Fascia: No Fat: No Tendon: No] [7:Fascia: No Fat: No Tendon: No] Muscle: No Muscle: No Muscle: No Joint: No Joint: No Joint: No Bone: No Bone: No Bone: No Limited to Skin Limited to Skin Limited to Skin Breakdown Breakdown Breakdown Epithelialization: Small (1-33%) Medium (34-66%) Small (1-33%) Periwound Skin Texture: Edema: Yes Edema: Yes Edema: Yes Excoriation: No Callus: Yes Callus: Yes Induration: No Excoriation: No Excoriation: No Callus: No Induration: No Induration: No Crepitus: No Crepitus: No Crepitus: No Fluctuance: No Fluctuance: No Fluctuance: No Friable: No Friable: No Friable: No Rash: No Rash: No Rash: No Scarring: No Scarring: No Scarring: No Periwound Skin Moist: Yes Moist: Yes Maceration: Yes Moisture: Dry/Scaly: Yes Maceration: No Moist: Yes Maceration: No Dry/Scaly: No Dry/Scaly: No Periwound Skin Color: Atrophie Blanche: No Atrophie Blanche: No Atrophie Blanche: No Cyanosis: No Cyanosis: No Cyanosis: No Ecchymosis: No Ecchymosis: No Ecchymosis: No Erythema: No Erythema: No Erythema: No Hemosiderin Staining: No Hemosiderin Staining: No Hemosiderin Staining: No Mottled: No Mottled: No Mottled: No Pallor: No Pallor: No Pallor: No Rubor: No Rubor: No Rubor: No Temperature: No Abnormality No Abnormality No Abnormality Tenderness on Yes No Yes Palpation: Wound Preparation: Ulcer Cleansing: Ulcer Cleansing: Ulcer Cleansing: Rinsed/Irrigated with Rinsed/Irrigated with Rinsed/Irrigated with Saline Saline Saline Topical Anesthetic Topical Anesthetic Topical Anesthetic Applied: Other: lidocaine Applied: Other: lidocaine Applied: Other: lidocaine 4% 4% 4% Treatment Notes Electronic Signature(s) Signed: 09/14/2015 4:32:43 PM By: Elpidio Eric BSN, RN Entered By: Elpidio Eric on 09/14/2015 15:11:15 Chris Russel  ([id number]) -------------------------------------------------------------------------------- Multi-Disciplinary Care Plan Details Patient Name: [name] K. Date of Service: 09/14/2015 3:00 PM Medical Record Number: [id number] Patient Account Number: [id number] Date of Birth/Sex: [birth year] (82 y.o. Male) Treating RN: Clover Mealy, RN, BSN, [place] Sink Primary Care Physician: Vonita Moss Other Clinician: Referring Physician: Vonita Moss Treating Physician/Extender: Altamese [place] in Treatment: 8 Active Inactive Abuse / Safety / Falls / Self Care Management Nursing Diagnoses: Impaired home maintenance Impaired physical mobility Knowledge deficit related to: safety; personal, health (wound), emergency Potential for falls Self care deficit: actual or potential Goals: Patient will remain injury free Date Initiated: 07/20/2015 Goal Status: Active Patient/caregiver will verbalize understanding of skin care regimen Date Initiated: 07/20/2015 Goal Status: Active Patient/caregiver will verbalize/demonstrate measure taken to improve self care Date Initiated: 07/20/2015 Goal Status: Active Patient/caregiver will verbalize/demonstrate measures taken to improve the patient's personal safety Date Initiated: 07/20/2015 Goal Status: Active Patient/caregiver will verbalize/demonstrate measures taken to prevent injury and/or falls Date Initiated: 07/20/2015 Goal Status: Active Patient/caregiver will verbalize/demonstrate understanding of what to do in case of emergency Date Initiated: 07/20/2015 Goal Status: Active Interventions: Assess fall risk on admission and as needed Assess: immobility, friction, shearing, incontinence upon admission and as needed Assess impairment of mobility on admission and as needed per policy Assess self care needs on admission and as needed Patient referred  to community resources (specify in notes) TRISTON, SKARE. ([id number]) Provide education on basic  hygiene Provide education on fall prevention Provide education on personal and home safety Provide education on safe transfers Treatment Activities: Education provided on Basic Hygiene : 08/10/2015 Notes: Orientation to the Wound Care Program Nursing Diagnoses: Knowledge deficit related to the wound healing center program Goals: Patient/caregiver will verbalize understanding of the Wound Healing Center Program Date Initiated: 07/20/2015 Goal Status: Active Interventions: Provide education on orientation to the wound center Notes: Wound/Skin Impairment Nursing Diagnoses: Impaired tissue integrity Knowledge deficit related to ulceration/compromised skin integrity Goals: Patient/caregiver will verbalize understanding of skin care regimen Date Initiated: 07/20/2015 Goal Status: Active Ulcer/skin breakdown will have a volume reduction of 50% by week 8 Date Initiated: 07/20/2015 Goal Status: Active Ulcer/skin breakdown will have a volume reduction of 80% by week 12 Date Initiated: 07/20/2015 Goal Status: Active Ulcer/skin breakdown will heal within 14 weeks Date Initiated: 07/20/2015 Goal Status: Active Interventions: Assess patient/caregiver ability to obtain necessary supplies Winch, [name] ([id number]) Assess patient/caregiver ability to perform ulcer/skin care regimen upon admission and as needed Assess ulceration(s) every visit Provide education on ulcer and skin care Treatment Activities: Patient referred to home care : 09/14/2015 Skin care regimen initiated : 09/14/2015 Topical wound management initiated : 09/14/2015 Notes: Electronic Signature(s) Signed: 09/14/2015 4:32:43 PM By: Elpidio Eric BSN, RN Entered By: Elpidio Eric on 09/14/2015 15:10:57 Camero, [name] ([id number]) -------------------------------------------------------------------------------- Pain Assessment Details Patient Name: [name] K. Date of Service: 09/14/2015 3:00 PM Medical Record Number:  [id number] Patient Account Number: [id number] Date of Birth/Sex: [birth year] (82 y.o. Male) Treating RN: Clover Mealy, RN, BSN, [place] Sink Primary Care Physician: Vonita Moss Other Clinician: Referring Physician: Vonita Moss Treating Physician/Extender: Maxwell Caul Weeks in Treatment: 8 Active Problems Location of Pain Severity and Description of Pain Patient Has Paino No Site Locations Pain Management and Medication Current Pain Management: Electronic Signature(s) Signed: 09/14/2015 4:32:43 PM By: Elpidio Eric BSN, RN Entered By: Elpidio Eric on 09/14/2015 14:52:12 Krohn, [name] ([id number]) -------------------------------------------------------------------------------- Patient/Caregiver Education Details Patient Name: [name] K. Date of Service: 09/14/2015 3:00 PM Medical Record Number: [id number] Patient Account Number: [id number] Date of Birth/Gender: 1924-09-17 (82 y.o. Male) Treating RN: Clover Mealy, RN, BSN, [place] Sink Primary Care Physician: Vonita Moss Other Clinician: Referring Physician: Vonita Moss Treating Physician/Extender: Altamese [place] in Treatment: 8 Education Assessment Education Provided To: Patient Education Topics Provided Basic Hygiene: Methods: Explain/Verbal Responses: State content correctly Safety: Methods: Explain/Verbal Responses: State content correctly Welcome To The [facility]: Methods: Explain/Verbal Responses: State content correctly Wound/Skin Impairment: Methods: Explain/Verbal Responses: State content correctly Electronic Signature(s) Signed: 09/14/2015 4:32:43 PM By: Elpidio Eric BSN, RN Entered By: Elpidio Eric on 09/14/2015 15:32:58 [name], [name] ([id number]) -------------------------------------------------------------------------------- Wound Assessment Details Patient Name: [name] K. Date of Service: 09/14/2015 3:00 PM Medical Record Number: [id number] Patient Account Number: [id number] Date of  Birth/Sex: 1924-08-13 (82 y.o. Male) Treating RN: Clover Mealy, RN, BSN, [place] Sink Primary Care Physician: Vonita Moss Other Clinician: Referring Physician: Vonita Moss Treating Physician/Extender: Maxwell Caul Weeks in Treatment: 8 Wound Status Wound Number: 5 Primary Diabetic Wound/Ulcer of the Lower Etiology: Extremity Wound Location: Left Calcaneus Wound Open Wounding Event: Blister Status: Date Acquired: 07/11/2015 Comorbid Hypertension, Type II Diabetes, History Weeks Of Treatment: 8 History: of pressure wounds, Osteoarthritis, Clustered Wound: No Neuropathy Photos Photo Uploaded By: Elpidio Eric on 09/14/2015 17:16:07 Wound Measurements Length: (cm) 1 Width: (cm) 1 Depth: (cm) 0.2 Area: (cm) 0.785 Volume: (cm) 0.157 % Reduction in  Area: 90.9% % Reduction in Volume: 81.8% Epithelialization: Small (1-33%) Tunneling: No Undermining: No Wound Description Classification: Grade 1 Wound Margin: Distinct, outline attached Exudate Amount: Small Exudate Type: Serosanguineous Dottavio, Stoy K. ([id number]) Foul Odor After Cleansing: No Exudate Color: red, brown Wound Bed Granulation Amount: Small (1-33%) Exposed Structure Granulation Quality: Red, Pink Fascia Exposed: No Necrotic Amount: Medium (34-66%) Fat Layer Exposed: No Necrotic Quality: Adherent Slough Tendon Exposed: No Muscle Exposed: No Joint Exposed: No Bone Exposed: No Limited to Skin Breakdown Periwound Skin Texture Texture Color No Abnormalities Noted: No No Abnormalities Noted: No Callus: No Atrophie Blanche: No Crepitus: No Cyanosis: No Excoriation: No Ecchymosis: No Fluctuance: No Erythema: No Friable: No Hemosiderin Staining: No Induration: No Mottled: No Localized Edema: Yes Pallor: No Rash: No Rubor: No Scarring: No Temperature / Pain Moisture Temperature: No Abnormality No Abnormalities Noted: No Tenderness on Palpation: Yes Dry / Scaly: Yes Maceration: No Moist: Yes Wound  Preparation Ulcer Cleansing: Rinsed/Irrigated with Saline Topical Anesthetic Applied: Other: lidocaine 4%, Treatment Notes Wound #5 (Left Calcaneus) 1. Cleansed with: Clean wound with Normal Saline 4. Dressing Applied: Iodoflex 5. Secondary Dressing Applied Gauze and Kerlix/Conform 7. Secured with Secretary/administrator) Signed: 09/14/2015 4:32:43 PM By: Elpidio Eric BSN, RN Chris Russel ([id number]) Entered By: Elpidio Eric on 09/14/2015 15:08:27 Chris Russel ([id number]) -------------------------------------------------------------------------------- Wound Assessment Details Patient Name: [name] K. Date of Service: 09/14/2015 3:00 PM Medical Record Number: [id number] Patient Account Number: [id number] Date of Birth/Sex: 1924/12/10 (82 y.o. Male) Treating RN: Clover Mealy, RN, BSN, Rita Primary Care Physician: Vonita Moss Other Clinician: Referring Physician: Vonita Moss Treating Physician/Extender: Maxwell Caul Weeks in Treatment: 8 Wound Status Wound Number: 6R Primary Diabetic Wound/Ulcer of the Lower Etiology: Extremity Wound Location: Left Foot - Plantar Wound Open Wounding Event: Gradually Appeared Status: Date Acquired: 07/12/2015 Comorbid Hypertension, Type II Diabetes, History Weeks Of Treatment: 8 History: of pressure wounds, Osteoarthritis, Clustered Wound: No Neuropathy Photos Photo Uploaded By: Elpidio Eric on 09/14/2015 17:16:07 Wound Measurements Length: (cm) 0.8 Width: (cm) 0.9 Depth: (cm) 0.2 Area: (cm) 0.565 Volume: (cm) 0.113 % Reduction in Area: 82% % Reduction in Volume: 64% Epithelialization: Medium (34-66%) Tunneling: No Undermining: No Wound Description Classification: Grade 1 Wound Margin: Distinct, outline attached Exudate Amount: Large Exudate Type: Serosanguineous Exudate Color: red, brown Foul Odor After Cleansing: No Wound Bed Granulation Amount: Medium (34-66%) Exposed Structure Granulation Quality:  Pink Fascia Exposed: No Necrotic Amount: Small (1-33%) Fat Layer Exposed: No Necrotic Quality: Adherent Slough Tendon Exposed: No [name], [name] ([id number]) Muscle Exposed: No Joint Exposed: No Bone Exposed: No Limited to Skin Breakdown Periwound Skin Texture Texture Color No Abnormalities Noted: No No Abnormalities Noted: No Callus: Yes Atrophie Blanche: No Crepitus: No Cyanosis: No Excoriation: No Ecchymosis: No Fluctuance: No Erythema: No Friable: No Hemosiderin Staining: No Induration: No Mottled: No Localized Edema: Yes Pallor: No Rash: No Rubor: No Scarring: No Temperature / Pain Moisture Temperature: No Abnormality No Abnormalities Noted: No Dry / Scaly: No Maceration: No Moist: Yes Wound Preparation Ulcer Cleansing: Rinsed/Irrigated with Saline Topical Anesthetic Applied: Other: lidocaine 4%, Treatment Notes Wound #6R (Left, Plantar Foot) 1. Cleansed with: Clean wound with Normal Saline 4. Dressing Applied: Iodoflex 5. Secondary Dressing Applied Gauze and Kerlix/Conform 7. Secured with Secretary/administrator) Signed: 09/14/2015 4:32:43 PM By: Elpidio Eric BSN, RN Entered By: Elpidio Eric on 09/14/2015 15:08:43 Chris Russel ([id number]) -------------------------------------------------------------------------------- Wound Assessment Details Patient Name: [name] K. Date of Service: 09/14/2015 3:00 PM Medical Record Number: [id number]  Patient Account Number: [id number] Date of Birth/Sex: 1925-04-26 (82 y.o. Male) Treating RN: Afful, RN, BSN, Rita Primary Care Physician: Vonita Moss Other Clinician: Referring Physician: Vonita Moss Treating Physician/Extender: Altamese [place] in Treatment: 8 Wound Status Wound Number: 7 Primary Diabetic Wound/Ulcer of the Lower Etiology: Extremity Wound Location: Left Metatarsal head first Wound Open Wounding Event: Gradually Appeared Status: Date Acquired: 07/11/2015 Comorbid  Hypertension, Type II Diabetes, History Weeks Of Treatment: 8 History: of pressure wounds, Osteoarthritis, Clustered Wound: No Neuropathy Photos Photo Uploaded By: Elpidio Eric on 09/14/2015 17:18:11 Wound Measurements Length: (cm) 2.1 Width: (cm) 2.5 Depth: (cm) 0.2 Area: (cm) 4.123 Volume: (cm) 0.825 % Reduction in Area: -250% % Reduction in Volume: -534.6% Epithelialization: Small (1-33%) Tunneling: No Undermining: No Wound Description Classification: Grade 1 Wound Margin: Distinct, outline attached Exudate Amount: Medium Exudate Type: Serosanguineous Exudate Color: red, brown Foul Odor After Cleansing: No Wound Bed Granulation Amount: Small (1-33%) Exposed Structure Granulation Quality: Pink Fascia Exposed: No Necrotic Amount: Large (67-100%) Fat Layer Exposed: No Necrotic Quality: Adherent Slough Tendon Exposed: No [name], [name] ([id number]) Muscle Exposed: No Joint Exposed: No Bone Exposed: No Limited to Skin Breakdown Periwound Skin Texture Texture Color No Abnormalities Noted: No No Abnormalities Noted: No Callus: Yes Atrophie Blanche: No Crepitus: No Cyanosis: No Excoriation: No Ecchymosis: No Fluctuance: No Erythema: No Friable: No Hemosiderin Staining: No Induration: No Mottled: No Localized Edema: Yes Pallor: No Rash: No Rubor: No Scarring: No Temperature / Pain Moisture Temperature: No Abnormality No Abnormalities Noted: No Tenderness on Palpation: Yes Dry / Scaly: No Maceration: Yes Moist: Yes Wound Preparation Ulcer Cleansing: Rinsed/Irrigated with Saline Topical Anesthetic Applied: Other: lidocaine 4%, Treatment Notes Wound #7 (Left Metatarsal head first) 1. Cleansed with: Clean wound with Normal Saline 4. Dressing Applied: Iodoflex 5. Secondary Dressing Applied Gauze and Kerlix/Conform 7. Secured with Secretary/administrator) Signed: 09/14/2015 4:32:43 PM By: Elpidio Eric BSN, RN Entered By: Elpidio Eric on  09/14/2015 15:10:44 Chris Russel ([id number]) -------------------------------------------------------------------------------- Vitals Details Patient Name: [name] K. Date of Service: 09/14/2015 3:00 PM Medical Record Number: [id number] Patient Account Number: [id number] Date of Birth/Sex: [birth year] (82 y.o. Male) Treating RN: Afful, RN, BSN, Rita Primary Care Physician: Vonita Moss Other Clinician: Referring Physician: Vonita Moss Treating Physician/Extender: Altamese [place] in Treatment: 8 Vital Signs Time Taken: 14:55 Pulse (bpm): 63 Height (in): 72 Respiratory Rate (breaths/min): 16 Weight (lbs): 195 Blood Pressure (mmHg): 137/64 Body Mass Index (BMI): 26.4 Reference Range: 80 - 120 mg / dl Electronic Signature(s) Signed: 09/14/2015 4:32:43 PM By: Elpidio Eric BSN, RN Entered By: Elpidio Eric on 09/14/2015 14:57:41

## 2015-09-19 ENCOUNTER — Telehealth: Payer: Self-pay

## 2015-09-19 MED ORDER — CYANOCOBALAMIN 1000 MCG/ML IJ SOLN: 1000.0000 ug | INTRAMUSCULAR | Status: DC

## 2015-09-19 NOTE — Telephone Encounter (Signed)
Requesting refill for  Cyanocobalamin 102400mcg/ML inj   IM twice monthly  It's in PP from last year

## 2015-09-21 ENCOUNTER — Encounter: Payer: PPO | Admitting: Internal Medicine

## 2015-09-21 DIAGNOSIS — E11621 Type 2 diabetes mellitus with foot ulcer: Secondary | ICD-10-CM | POA: Diagnosis not present

## 2015-09-22 NOTE — Progress Notes (Signed)
[name] ([id number]) Visit Report for 09/21/2015 Chief Complaint Document Details Patient Name: [name], [name]. Date of Service: 09/21/2015 1:30 PM Medical Record Patient Account Number: [id number] Number: Treating RN: Baruch Gouty, RN, BSN, Rita November 26, 1924 [phone] y.o. Other Clinician: Date of Birth/Sex: Male) Treating Sennie Borden Primary Care Physician: Golden Pop Physician/Extender: G Referring Physician: Golden Pop Weeks in Treatment: 9 Information Obtained from: Patient Chief Complaint Has had swelling of both lower extremities with some ulceration on the right lower extremity. 07/20/15 patient returns today predominantly for a painful wound on the plantar aspect of his left foot of recent onset. Electronic Signature(s) Signed: 09/21/2015 5:01:27 PM By: [name] MD Entered By: [name] on 09/21/2015 13:52:38 Figiel, Junie Spencer ([id number]) -------------------------------------------------------------------------------- Debridement Details Patient Name: Chris Bottoms K. Date of Service: 09/21/2015 1:30 PM Medical Record Patient Account Number: [id number] Number: Treating RN: Baruch Gouty, RN, BSN, Rita [birth year] [phone] y.o. Other Clinician: Date of Birth/Sex: Male) Treating Damon Hargrove, [place] Primary Care Physician: CRISSMAN, [place]: G Referring Physician: Golden Pop Weeks in Treatment: 9 Debridement Performed for Wound #7 Left Metatarsal head first Assessment: Performed By: Physician [name], MD Debridement: Debridement Pre-procedure Yes Verification/Time Out Taken: Start Time: 13:24 Pain Control: Lidocaine 4% Topical Solution Level: Skin/Subcutaneous Tissue Total Area Debrided (L x 2.5 (cm) x 2.5 (cm) = 6.25 (cm) W): Tissue and other Non-Viable, Exudate, Fibrin/Slough, Subcutaneous material debrided: Instrument: Curette Bleeding: Minimum Hemostasis Achieved: Pressure End Time: 13:30 Procedural  Pain: 0 Post Procedural Pain: 0 Response to Treatment: Procedure was tolerated well Post Procedure Diagnosis Same as Pre-procedure Electronic Signature(s) Signed: 09/21/2015 3:02:50 PM By: Regan Lemming BSN, RN Signed: 09/21/2015 5:01:27 PM By: [name] MD Entered By: [name] on 09/21/2015 13:51:58 [name], [name] ([id number]) -------------------------------------------------------------------------------- HPI Details Patient Name: Chris Bottoms K. Date of Service: 09/21/2015 1:30 PM Medical Record Patient Account Number: [id number] Number: Treating RN: Baruch Gouty, RN, BSN, Rita 1925-06-02 [phone] y.o. Other Clinician: Date of Birth/Sex: Male) Treating Abubakr Wieman Primary Care Physician: Golden Pop Physician/Extender: G Referring Physician: Golden Pop Weeks in Treatment: 9 History of Present Illness Location: had swelling of the right lower extremity with some ulceration on the medial part of his calf and on the right fifth toe Quality: Patient reports No Pain. Severity: Patient states wound (s) are getting better. Duration: Patient has had the wound for < 2 weeks prior to presenting for treatment Context: The wound appeared gradually over time. nail on his right fifth toe came off loose and he pried it Modifying Factors: Other treatment(s) tried include:vascular workup where he is going to be seeing Dr. Lucky Cowboy on Monday Associated Signs and Symptoms: Patient reports presence of swelling HPI Description: Very pleasant 82 year old with a past medical history significant for type 2 diabetes. He underwent left knee surgery in February 2015. He had an angioplasty of his R posterior tibial on 10/30/13. Vascular workup done in April of this year showed an ABI on the right to be noncompressible and the left to be 0.86. About a week ago he noticed some swelling on the right lower extremity and then an ulceration on the medial part of his calf. He also noted that the  right fifth nail bed had a loose nail and he pried it open and now has an open wound. He has no evidence of cellulitis. he was seen by the nurse practitioner at his PCPs office and put on Bactroban ointment and doxycycline for 10 days. 01/21/2015 -- he was seen by Dr. Ronalee Belts  and though we do not have the note, we know he had used an Unna's boot and asked him to come to see as again for a compression bandage. The patient has no fresh complaints. 07/20/15; the patient returns today predominantly for her wounds on his left plantar foot. He was seen here in the summer last on 8/19 for wounds on his right medial calf and right fifth nail bed. I don't know that he actually returned to be discharged, he is wearing compression on bilateral legs. He has known peripheral arterial disease in the setting of type 2 diabetes having an angioplasty on the right posterior tibial artery on 10/30/13. His ABI calculated in the clinic today was 0.97 on the right. I don't think the left could be done. The patient tells me that roughly a week ago he started to develop pain in the plantar aspect of his left foot. I think his caregiver noticed the wounds and he is here for our review events. He is minimally ambulatory, spends most of his time in a wheelchair. I am not certain of the issue here. 07/27/15; the patient is here for 3 wounds. The first over his left first met head, second over the left medial heel and the third over the left fourth metatarsal head. Culture from the fourth metatarsal head last week showed Klebsiella. This would not be sensitive to the doxycycline possibly I have therefore changed him to Keflex to which the Klebsiella should be sensitive 08/10/15; patient returns today. He was not seen last week. He does have home health. The area over the left fourth metatarsal head is almost completely closed the area over the medial aspect of his left first metatarsal head and the left medial heel still  required debridement [name], [name] ([id number]) 08/17/15 the area over the fourth metatarsal head has resolved. The area on his left first metatarsal head lateral aspect and the left medial heel still require surgical debridement although the surface is beginning to look a bit better. Is followed with vascular surgery and is apparently being planned for an angiogram and possible stent on 3/28 08/23/15 the patient complained of pain over his fourth metatarsal head, the previously closed wound. Physical exam did not really show a source he had a small amount of callus which I lightly removed to expose a large amount of purulent drainage. The area was then fully debridement the area on the first metatarsal head lateral aspect and left medial heel appear improved [Iodosorb] 08/31/15; the patient is completed a well-tolerated angioplasty of the left posterior tibial artery. The left common femoral profunda femoris and superficial femoral arteries are diffusely diseased but there was no hemodynamically significant lesion. Culture I did last week from the left fourth metatarsal head enterococcus I'm going to give him another week of Keflex which should cover this. Patient states he had some pain but it seems to have resolved in the sole of his foot 09/07/15 he has completed his Keflex yesterday. 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 Using Iodoflex. He is completed antibiotics. 09/21/15; MRI next week, we will see him in 2 weeks. Electronic Signature(s) Signed: 09/21/2015 5:01:27 PM By: [name] MD Entered By: [name] on 09/21/2015 13:53:45 Fujii, Junie Spencer ([id number]) -------------------------------------------------------------------------------- Physical Exam Details Patient Name: Chris Bottoms K. Date of Service: 09/21/2015 1:30 PM Medical Record Patient Account  Number: [id number] Number: Treating RN: Baruch Gouty, RN, BSN, Rita January 05, 1925 [phone] y.o. Other Clinician: Date of Birth/Sex: Male) Treating Kaelen Brennan Primary Care Physician: Golden Pop Physician/Extender: G Referring Physician: Golden Pop Weeks in Treatment: 9 Notes Wound exam. the left 4th met head and the lateral aspect of his left heel appear better. His wound over the lateral aspect of the first met head is unchanged and close to bone Electronic Signature(s) Signed: 09/21/2015 5:01:27 PM By: [name] MD Entered By: [name] on 09/21/2015 13:55:44 Cajas, Junie Spencer ([id number]) -------------------------------------------------------------------------------- Physician Orders Details Patient Name: Chris Bottoms K. Date of Service: 09/21/2015 1:30 PM Medical Record Patient Account Number: [id number] Number: Treating RN: Baruch Gouty, RN, BSN, Rita [birth year] [phone] y.o. Other Clinician: Date of Birth/Sex: Male) Treating Anayah Arvanitis, [place] Primary Care Physician: Golden Pop Physician/Extender: G Referring Physician: Golden Pop Weeks in Treatment: 9 Verbal / Phone Orders: Yes Clinician: Afful, RN, BSN, Rita Read Back and Verified: Yes Diagnosis Coding Wound Cleansing Wound #5 Left Calcaneus o Cleanse wound with mild soap and water Wound #6R Left,Plantar Foot o Cleanse wound with mild soap and water Wound #7 Left Metatarsal head first o Cleanse wound with mild soap and water Anesthetic Wound #5 Left Calcaneus o Topical Lidocaine 4% cream applied to wound bed prior to debridement Wound #6R Left,Plantar Foot o Topical Lidocaine 4% cream applied to wound bed prior to debridement Wound #7 Left Metatarsal head first o Topical Lidocaine 4% cream applied to wound bed prior to debridement Primary Wound Dressing Wound #5 Left Calcaneus o Iodoflex Wound #6R Left,Plantar Foot o Iodoflex Wound #7 Left Metatarsal head first o  Iodoflex Secondary Dressing Wound #5 Left Calcaneus o Gauze and Kerlix/Conform Wound #6R Left,Plantar Foot Avants, Sherley K. ([id number]) o Gauze and Kerlix/Conform Wound #7 Left Metatarsal head first o Gauze and Kerlix/Conform Dressing Change Frequency Wound #5 Left Calcaneus o Change dressing every day. Wound #6R Left,Plantar Foot o Change dressing every day. Wound #7 Left Metatarsal head first o Change dressing every day. Follow-up Appointments Wound #5 Left Calcaneus o Return Appointment in 2 weeks. Wound #6R Left,Plantar Foot o Return Appointment in 2 weeks. Wound #7 Left Metatarsal head first o Return Appointment in 2 weeks. Off-Loading Wound #5 Left Calcaneus o Turn and reposition every 2 hours Wound #6R Left,Plantar Foot o Turn and reposition every 2 hours Wound #7 Left Metatarsal head first o Turn and reposition every 2 hours Additional Orders / Instructions Wound #5 Left Calcaneus o Increase protein intake. o Activity as tolerated Wound #6R Left,Plantar Foot o Increase protein intake. o Activity as tolerated Wound #7 Left Metatarsal head first o Increase protein intake. o Activity as tolerated Sprigg, Irvin K. ([id number]) Home Health Wound #5 Left Calcaneus o Continue Home Health Visits - [place] Nurse may visit PRN to address patientos wound care needs. o FACE TO FACE ENCOUNTER: MEDICARE and MEDICAID PATIENTS: I certify that this patient is under my care and that I had a face-to-face encounter that meets the physician face-to-face encounter requirements with this patient on this date. The encounter with the patient was in whole or in part for the following MEDICAL CONDITION: (primary reason for [place]) MEDICAL NECESSITY: I certify, that based on my findings, NURSING services are a medically necessary home health service. HOME BOUND STATUS: I certify that my clinical findings support that  this patient is homebound (i.e., Due to illness or injury, pt requires aid of supportive devices such as crutches, cane, wheelchairs, walkers, the use  of special transportation or the assistance of another person to leave their place of residence. There is a normal inability to leave the home and doing so requires considerable and taxing effort. Other absences are for medical reasons / religious services and are infrequent or of short duration when for other reasons). o If current dressing causes regression in wound condition, may D/C ordered dressing product/s and apply Normal Saline Moist Dressing daily until next [facility] / Other MD appointment. [place] of regression in wound condition at [phone]. o Please direct any NON-WOUND related issues/requests for orders to patient's Primary Care Physician Wound #6R [place] Nurse may visit PRN to address patientos wound care needs. o FACE TO FACE ENCOUNTER: MEDICARE and MEDICAID PATIENTS: I certify that this patient is under my care and that I had a face-to-face encounter that meets the physician face-to-face encounter requirements with this patient on this date. The encounter with the patient was in whole or in part for the following MEDICAL CONDITION: (primary reason for [place]) MEDICAL NECESSITY: I certify, that based on my findings, NURSING services are a medically necessary home health service. HOME BOUND STATUS: I certify that my clinical findings support that this patient is homebound (i.e., Due to illness or injury, pt requires aid of supportive devices such as crutches, cane, wheelchairs, walkers, the use of special transportation or the assistance of another person to leave their place of residence. There is a normal inability to leave the home and doing so requires considerable and taxing effort. Other absences are for medical  reasons / religious services and are infrequent or of short duration when for other reasons). o If current dressing causes regression in wound condition, may D/C ordered dressing product/s and apply Normal Saline Moist Dressing daily until next [place] / Other MD appointment. [place] of regression in wound condition at [phone]. o Please direct any NON-WOUND related issues/requests for orders to patient's Primary Care Physician Wound #7 Left Metatarsal head first o [place] Nurse may visit PRN to address patientos wound care needs. o FACE TO FACE ENCOUNTER: MEDICARE and MEDICAID PATIENTS: I certify that this patient is under my care and that I had a face-to-face encounter that meets the physician face-to-face encounter requirements with this patient on this date. The encounter with the patient was in whole or in part for the following MEDICAL CONDITION: (primary reason for [place]) JOANN, KULPA ([id number]) MEDICAL NECESSITY: I certify, that based on my findings, NURSING services are a medically necessary home health service. HOME BOUND STATUS: I certify that my clinical findings support that this patient is homebound (i.e., Due to illness or injury, pt requires aid of supportive devices such as crutches, cane, wheelchairs, walkers, the use of special transportation or the assistance of another person to leave their place of residence. There is a normal inability to leave the home and doing so requires considerable and taxing effort. Other absences are for medical reasons / religious services and are infrequent or of short duration when for other reasons). o If current dressing causes regression in wound condition, may D/C ordered dressing product/s and apply Normal Saline Moist Dressing daily until next [place] / Other MD appointment. [place] of regression in wound  condition at [phone]. o Please direct any NON-WOUND related issues/requests for orders to patient's Primary Care Physician Electronic Signature(s) Signed:  09/21/2015 3:02:50 PM By: Regan Lemming BSN, RN Signed: 09/21/2015 5:01:27 PM By: [name] MD Entered By: Regan Lemming on 09/21/2015 13:54:48 Camposano, Junie Spencer ([id number]) -------------------------------------------------------------------------------- Problem List Details Patient Name: Chris Bottoms K. Date of Service: 09/21/2015 1:30 PM Medical Record Patient Account Number: [id number] Number: Treating RN: Baruch Gouty, RN, BSN, Rita [birth year] [phone] y.o. Other Clinician: Date of Birth/Sex: Male) Treating Delaynee Alred Primary Care Physician: Golden Pop Physician/Extender: G Referring Physician: Golden Pop Weeks in Treatment: 9 Active Problems ICD-10 Encounter Code Description Active Date Diagnosis L97.522 Non-pressure chronic ulcer of other part of left foot with fat 07/20/2015 Yes layer exposed L97.511 Non-pressure chronic ulcer of other part of right foot 07/20/2015 Yes limited to breakdown of skin E11.621 Type 2 diabetes mellitus with foot ulcer 07/20/2015 Yes E11.51 Type 2 diabetes mellitus with diabetic peripheral 07/20/2015 Yes angiopathy without gangrene Inactive Problems Resolved Problems Electronic Signature(s) Signed: 09/21/2015 5:01:27 PM By: [name] MD Entered By: [name] on 09/21/2015 13:51:35 [place]eral, Jamarie K. ([id number]) -------------------------------------------------------------------------------- Progress Note Details Patient Name: Chris Bottoms K. Date of Service: 09/21/2015 1:30 PM Medical Record Patient Account Number: [id number] Number: Treating RN: Baruch Gouty, RN, BSN, Rita 08/06/1924 [phone] y.o. Other Clinician: Date of Birth/Sex: Male) Treating Liliahna Cudd Primary Care Physician: Golden Pop Physician/Extender: G Referring Physician: Golden Pop Weeks in Treatment: 9 Subjective Chief Complaint Information obtained from Patient Has had swelling of both lower extremities with some ulceration on the right lower extremity. 07/20/15 patient returns today predominantly for a painful wound on the plantar aspect of his left foot of recent onset. History of Present Illness (HPI) The following HPI elements were documented for the patient's wound: Location: had swelling of the right lower extremity with some ulceration on the medial part of his calf and on the right fifth toe Quality: Patient reports No Pain. Severity: Patient states wound (s) are getting better. Duration: Patient has had the wound for < 2 weeks prior to presenting for treatment Context: The wound appeared gradually over time. nail on his right fifth toe came off loose and he pried it Modifying Factors: Other treatment(s) tried include:vascular workup where he is going to be seeing Dr. Lucky Cowboy on Monday Associated Signs and Symptoms: Patient reports presence of swelling Very pleasant 82 year old with a past medical history significant for type 2 diabetes. He underwent left knee surgery in February 2015. He had an angioplasty of his R posterior tibial on 10/30/13. Vascular workup done in April of this year showed an ABI on the right to be noncompressible and the left to be 0.86. About a week ago he noticed some swelling on the right lower extremity and then an ulceration on the medial part of his calf. He also noted that the right fifth nail bed had a loose nail and he pried it open and now has an open wound. He has no evidence of cellulitis. he was seen by the nurse practitioner at his PCPs office and put on Bactroban ointment and doxycycline for 10 days. 01/21/2015 -- he was seen by Dr. Ronalee Belts and though we do not have the note, we know he had used an Unna's boot and asked him to come to see as again for a compression bandage. The patient has no  fresh complaints. 07/20/15; the patient returns today predominantly for her wounds on his left plantar foot. He was seen here in the summer last on 8/19 for wounds on his right medial calf and right fifth nail bed. I don't know that  he actually returned to be discharged, he is wearing compression on bilateral legs. He has known peripheral arterial disease in the setting of type 2 diabetes having an angioplasty on the right posterior tibial artery on 10/30/13. His ABI calculated in the clinic today was 0.97 on the right. I don't think the left could be done. The patient tells me that roughly a week ago he started to develop pain in the plantar aspect of his left foot. I Cristobal, Tykel K. ([id number]) think his caregiver noticed the wounds and he is here for our review events. He is minimally ambulatory, spends most of his time in a wheelchair. I am not certain of the issue here. 07/27/15; the patient is here for 3 wounds. The first over his left first met head, second over the left medial heel and the third over the left fourth metatarsal head. Culture from the fourth metatarsal head last week showed Klebsiella. This would not be sensitive to the doxycycline possibly I have therefore changed him to Keflex to which the Klebsiella should be sensitive 08/10/15; patient returns today. He was not seen last week. He does have home health. The area over the left fourth metatarsal head is almost completely closed the area over the medial aspect of his left first metatarsal head and the left medial heel still required debridement 08/17/15 the area over the fourth metatarsal head has resolved. The area on his left first metatarsal head lateral aspect and the left medial heel still require surgical debridement although the surface is beginning to look a bit better. Is followed with vascular surgery and is apparently being planned for an angiogram and possible stent on 3/28 08/23/15 the patient complained of pain  over his fourth metatarsal head, the previously closed wound. Physical exam did not really show a source he had a small amount of callus which I lightly removed to expose a large amount of purulent drainage. The area was then fully debridement the area on the first metatarsal head lateral aspect and left medial heel appear improved [Iodosorb] 08/31/15; the patient is completed a well-tolerated angioplasty of the left posterior tibial artery. The left common femoral profunda femoris and superficial femoral arteries are diffusely diseased but there was no hemodynamically significant lesion. Culture I did last week from the left fourth metatarsal head enterococcus I'm going to give him another week of Keflex which should cover this. Patient states he had some pain but it seems to have resolved in the sole of his foot 09/07/15 he has completed his Keflex yesterday. 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 Using Iodoflex. He is completed antibiotics. 09/21/15; MRI next week, we will see him in 2 weeks. Objective Constitutional Vitals Time Taken: 1:27 PM, Height: 72 in, Weight: 195 lbs, BMI: 26.4, Temperature: 97.3 F, Pulse: 76 bpm, Respiratory Rate: 17 breaths/min, Blood Pressure: 145/91 mmHg. Integumentary (Hair, Skin) Wound #5 status is Open. Original cause of wound was Blister. The wound is located on the Left Calcaneus. The wound measures 0.8cm length x 0.8cm width x 0.2cm depth; 0.503cm^2 area and 0.101cm^3 volume. The wound is limited to skin breakdown. There is no tunneling or undermining noted. There is a large amount of serosanguineous drainage noted. The wound margin is distinct with the outline attached to the wound base. There is small (1-33%) red, pink granulation within the wound bed. There is  a medium (34-66%) amount of necrotic tissue within the wound bed including  Adherent Slough. The periwound skin appearance exhibited: Localized Edema, Dry/Scaly, Moist. The periwound skin appearance did not exhibit: Callus, Crepitus, Excoriation, Fluctuance, Friable, Induration, Rash, Scarring, Maceration, Atrophie Blanche, Cyanosis, Ecchymosis, Hemosiderin Staining, Mottled, Pallor, Rubor, Erythema. Periwound temperature was noted as No Abnormality. The periwound has tenderness on palpation. KAIRE, STARY. ([id number]) Wound #6R status is Open. Original cause of wound was Gradually Appeared. The wound is located on the [place]. The wound measures 0.9cm length x 1cm width x 0.1cm depth; 0.707cm^2 area and 0.071cm^3 volume. The wound is limited to skin breakdown. There is no tunneling or undermining noted. There is a large amount of serosanguineous drainage noted. The wound margin is distinct with the outline attached to the wound base. There is medium (34-66%) pink granulation within the wound bed. There is a small (1-33%) amount of necrotic tissue within the wound bed including Adherent Slough. The periwound skin appearance exhibited: Callus, Localized Edema, Moist. The periwound skin appearance did not exhibit: Crepitus, Excoriation, Fluctuance, Friable, Induration, Rash, Scarring, Dry/Scaly, Maceration, Atrophie Blanche, Cyanosis, Ecchymosis, Hemosiderin Staining, Mottled, Pallor, Rubor, Erythema. Periwound temperature was noted as No Abnormality. The periwound has tenderness on palpation. Wound #7 status is Open. Original cause of wound was Gradually Appeared. The wound is located on the Left Metatarsal head first. The wound measures 2.5cm length x 2.5cm width x 0.2cm depth; 4.909cm^2 area and 0.982cm^3 volume. The wound is limited to skin breakdown. There is no tunneling or undermining noted. There is a large amount of serosanguineous drainage noted. The wound margin is distinct with the outline attached to the wound base. There is small (1-33%) pink  granulation within the wound bed. There is a large (67-100%) amount of necrotic tissue within the wound bed including Adherent Slough. The periwound skin appearance exhibited: Callus, Localized Edema, Maceration, Moist. The periwound skin appearance did not exhibit: Crepitus, Excoriation, Fluctuance, Friable, Induration, Rash, Scarring, Dry/Scaly, Atrophie Blanche, Cyanosis, Ecchymosis, Hemosiderin Staining, Mottled, Pallor, Rubor, Erythema. Periwound temperature was noted as No Abnormality. The periwound has tenderness on palpation. Assessment Active Problems ICD-10 L97.522 - Non-pressure chronic ulcer of other part of left foot with fat layer exposed L97.511 - Non-pressure chronic ulcer of other part of right foot limited to breakdown of skin E11.621 - Type 2 diabetes mellitus with foot ulcer E11.51 - Type 2 diabetes mellitus with diabetic peripheral angiopathy without gangrene Procedures Wound #7 Wound #7 is a Diabetic Wound/Ulcer of the Lower Extremity located on the Left Metatarsal head first . There was a Skin/Subcutaneous Tissue Debridement ([id number]) debridement with total area of 6.25 sq cm performed by [name], MD. with the following instrument(s): Curette to remove Non- Viable tissue/material including Exudate, Fibrin/Slough, and Subcutaneous after achieving pain control using Lidocaine 4% Topical Solution. A time out was conducted prior to the start of the procedure. A Romm, Treshawn K. ([id number]) Minimum amount of bleeding was controlled with Pressure. The procedure was tolerated well with a pain level of 0 throughout and a pain level of 0 following the procedure. Post procedure Diagnosis Wound #7: Same as Pre-Procedure Plan Wound Cleansing: Wound #5 Left Calcaneus: Cleanse wound with mild soap and water Wound #6R Left,Plantar Foot: Cleanse wound with mild soap and water Wound #7 Left Metatarsal head first: Cleanse wound with mild soap and  water Anesthetic: Wound #5 Left Calcaneus: Topical Lidocaine 4% cream applied to wound bed prior to debridement Wound #6R Left,Plantar Foot: Topical Lidocaine 4%  cream applied to wound bed prior to debridement Wound #7 Left Metatarsal head first: Topical Lidocaine 4% cream applied to wound bed prior to debridement Primary Wound Dressing: Wound #5 Left Calcaneus: Iodoflex Wound #6R Left,Plantar Foot: Iodoflex Wound #7 Left Metatarsal head first: Iodoflex Secondary Dressing: Wound #5 Left Calcaneus: Gauze and Kerlix/Conform Wound #6R Left,Plantar Foot: Gauze and Kerlix/Conform Wound #7 Left Metatarsal head first: Gauze and Kerlix/Conform Dressing Change Frequency: Wound #5 Left Calcaneus: Change dressing every day. Wound #6R Left,Plantar Foot: Change dressing every day. Wound #7 Left Metatarsal head first: Change dressing every day. Follow-up Appointments: Wound #5 Left Calcaneus: Return Appointment in 2 weeks. Wound #6R Left,Plantar Foot: Loureiro, Roddie K. ([id number]) Return Appointment in 2 weeks. Wound #7 Left Metatarsal head first: Return Appointment in 2 weeks. Off-Loading: Wound #5 Left Calcaneus: Turn and reposition every 2 hours Wound #6R Left,Plantar Foot: Turn and reposition every 2 hours Wound #7 Left Metatarsal head first: Turn and reposition every 2 hours Additional Orders / Instructions: Wound #5 Left Calcaneus: Increase protein intake. Activity as tolerated Wound #6R Left,Plantar Foot: Increase protein intake. Activity as tolerated Wound #7 Left Metatarsal head first: Increase protein intake. Activity as tolerated Home Health: Wound #5 Left Calcaneus: Continue Home Health Visits - [place] Nurse may visit PRN to address patient s wound care needs. FACE TO FACE ENCOUNTER: MEDICARE and MEDICAID PATIENTS: I certify that this patient is under my care and that I had a face-to-face encounter that meets the physician face-to-face  encounter requirements with this patient on this date. The encounter with the patient was in whole or in part for the following MEDICAL CONDITION: (primary reason for [place]) MEDICAL NECESSITY: I certify, that based on my findings, NURSING services are a medically necessary home health service. HOME BOUND STATUS: I certify that my clinical findings support that this patient is homebound (i.e., Due to illness or injury, pt requires aid of supportive devices such as crutches, cane, wheelchairs, walkers, the use of special transportation or the assistance of another person to leave their place of residence. There is a normal inability to leave the home and doing so requires considerable and taxing effort. Other absences are for medical reasons / religious services and are infrequent or of short duration when for other reasons). If current dressing causes regression in wound condition, may D/C ordered dressing product/s and apply Normal Saline Moist Dressing daily until next [place] / Other MD appointment. [place] of regression in wound condition at [phone]. Please direct any NON-WOUND related issues/requests for orders to patient's Primary Care Physician Wound #6R Left,Plantar Foot: [place] Nurse may visit PRN to address patient s wound care needs. FACE TO FACE ENCOUNTER: MEDICARE and MEDICAID PATIENTS: I certify that this patient is under my care and that I had a face-to-face encounter that meets the physician face-to-face encounter requirements with this patient on this date. The encounter with the patient was in whole or in part for the following MEDICAL CONDITION: (primary reason for [place]) MEDICAL NECESSITY: I certify, that based on my findings, NURSING services are a medically necessary home health service. HOME BOUND STATUS: I certify that my clinical findings support that this patient is homebound  (i.e., Due to illness or injury, pt requires aid of supportive devices such as crutches, cane, wheelchairs, walkers, the use of special transportation or the assistance of another person to leave their place of residence. There is a normal inability to leave  the home and doing so requires considerable and taxing effort. Other absences are for medical reasons / religious services and are infrequent or of short duration when for other reasons). THIERRY, DOBOSZ. ([id number]) If current dressing causes regression in wound condition, may D/C ordered dressing product/s and apply Normal Saline Moist Dressing daily until next [place] / Other MD appointment. [place] of regression in wound condition at [phone]. Please direct any NON-WOUND related issues/requests for orders to patient's Primary Care Physician Wound #7 Left Metatarsal head first: [place] Nurse may visit PRN to address patient s wound care needs. FACE TO FACE ENCOUNTER: MEDICARE and MEDICAID PATIENTS: I certify that this patient is under my care and that I had a face-to-face encounter that meets the physician face-to-face encounter requirements with this patient on this date. The encounter with the patient was in whole or in part for the following MEDICAL CONDITION: (primary reason for [place]) MEDICAL NECESSITY: I certify, that based on my findings, NURSING services are a medically necessary home health service. HOME BOUND STATUS: I certify that my clinical findings support that this patient is homebound (i.e., Due to illness or injury, pt requires aid of supportive devices such as crutches, cane, wheelchairs, walkers, the use of special transportation or the assistance of another person to leave their place of residence. There is a normal inability to leave the home and doing so requires considerable and taxing effort. Other absences are for medical reasons /  religious services and are infrequent or of short duration when for other reasons). If current dressing causes regression in wound condition, may D/C ordered dressing product/s and apply Normal Saline Moist Dressing daily until next [place] / Other MD appointment. [place] of regression in wound condition at [phone]. Please direct any NON-WOUND related issues/requests for orders to patient's Primary Care Physician MRI next week. will see him in 2 weeks and decide on course of acttion Continue iodoflex for 2 wks Electronic Signature(s) Signed: 09/21/2015 5:01:27 PM By: [name] MD Entered By: [name] on 09/21/2015 13:57:02 Vanwart, [place] K. ([id number]) -------------------------------------------------------------------------------- SuperBill Details Patient Name: Chris Bottoms K. Date of Service: 09/21/2015 Medical Record Patient Account Number: [id number] Number: Treating RN: Baruch Gouty, RN, BSN, Rita 1924/10/05 [phone] y.o. Other Clinician: Date of Birth/Sex: Male) Treating Kerrick Miler, [place] Primary Care Physician: Golden Pop Physician/Extender: G Referring Physician: Golden Pop Weeks in Treatment: 9 Diagnosis Coding ICD-10 Codes Code Description [phone] Non-pressure chronic ulcer of other part of left foot with fat layer exposed L97.511 Non-pressure chronic ulcer of other part of right foot limited to breakdown of skin E11.621 Type 2 diabetes mellitus with foot ulcer E11.51 Type 2 diabetes mellitus with diabetic peripheral angiopathy without gangrene Facility Procedures CPT4 Code Description: [id number] 11042 - DEB SUBQ TISSUE 20 SQ CM/< ICD-10 Description Diagnosis L97.522 Non-pressure chronic ulcer of other part of left foot Modifier: with fat lay Quantity: 1 er exposed Physician Procedures CPT4 Code Description: [id number] 11042 - WC PHYS SUBQ TISS 20 SQ CM ICD-10 Description Diagnosis L97.522 Non-pressure chronic ulcer of  other part of left foot Modifier: with fat laye Quantity: 1 r exposed Electronic Signature(s) Signed: 09/21/2015 5:01:27 PM By: [name] MD Entered By: [name] on 09/21/2015 13:57:38

## 2015-09-22 NOTE — Progress Notes (Signed)
[name], Tremell K. ([id number]) Visit Report for 09/21/2015 Arrival Information Details Patient Name: [name], [name] Date of Service: 09/21/2015 1:30 PM Medical Record Number: [id number] Patient Account Number: [id number] Date of Birth/Sex: 1925/06/04 14(82 y.o. Male) Treating RN: Clover MealyAfful, RN, BSN, [place] Sinkita Primary Care Physician: Vonita MossRISSMAN, MARK Other Clinician: Referring Physician: Vonita MossRISSMAN, MARK Treating Physician/Extender: Altamese CarolinaOBSON, MICHAEL G Weeks in Treatment: 9 Visit Information History Since Last Visit Added or deleted any medications: No Patient Arrived: Wheel Chair Any new allergies or adverse reactions: No Arrival Time: 13:20 Had a fall or experienced change in No activities of daily living that may affect Accompanied By: son risk of falls: Transfer Assistance: None Signs or symptoms of abuse/neglect since last No Patient Identification Verified: Yes visito Secondary Verification Process Yes Hospitalized since last visit: No Completed: Has Dressing in Place as Prescribed: Yes Patient Requires Transmission-Based No Pain Present Now: No Precautions: Patient Has Alerts: No Electronic Signature(s) Signed: 09/21/2015 3:02:50 PM By: Elpidio EricAfful, Rita BSN, RN Entered By: Elpidio EricAfful, Rita on 09/21/2015 13:21:27 [name], [name] ([id number]) -------------------------------------------------------------------------------- Encounter Discharge Information Details Patient Name: Chris RidingHENSLEY, [name] Date of Service: 09/21/2015 1:30 PM Medical Record Number: [id number] Patient Account Number: [id number] Date of Birth/Sex: 1925/06/04 74(82 y.o. Male) Treating RN: Clover MealyAfful, RN, BSN, [place] Sinkita Primary Care Physician: Vonita MossRISSMAN, MARK Other Clinician: Referring Physician: Vonita MossRISSMAN, MARK Treating Physician/Extender: Altamese CarolinaOBSON, MICHAEL G Weeks in Treatment: 9 Encounter Discharge Information Items Discharge Pain Level: 0 Discharge Condition: Stable Ambulatory Status: Wheelchair Discharge Destination:  Home Transportation: Other Accompanied By: caregiver Schedule Follow-up Appointment: No Medication Reconciliation completed and provided to Patient/Care No Brandt Chaney: Provided on Clinical Summary of Care: 09/21/2015 Form Type [facility] Signed: 09/21/2015 3:02:50 PM By: Elpidio EricAfful, Rita BSN, RN Previous Signature: 09/21/2015 1:50:10 PM Version By: Gwenlyn PerkingMoore, Shelia Entered By: Elpidio EricAfful, Rita on 09/21/2015 13:53:34 Haak, [name] ([id number]) -------------------------------------------------------------------------------- Lower Extremity Assessment Details Patient Name: Chris RidingHENSLEY, [name] Date of Service: 09/21/2015 1:30 PM Medical Record Number: [id number] Patient Account Number: [id number] Date of Birth/Sex: 1925/06/04 82(82 y.o. Male) Treating RN: Clover MealyAfful, RN, BSN, [place] Sinkita Primary Care Physician: Vonita MossRISSMAN, MARK Other Clinician: Referring Physician: Vonita MossRISSMAN, MARK Treating Physician/Extender: Altamese CarolinaOBSON, MICHAEL G Weeks in Treatment: 9 Vascular Assessment Pulses: Posterior Tibial Dorsalis Pedis Palpable: [Left:No] Doppler: [Left:Monophasic] Extremity colors, hair growth, and conditions: Extremity Color: [Left:Mottled] Hair Growth on Extremity: [Left:No] Temperature of Extremity: [Left:Warm] Capillary Refill: [Left:< 3 seconds] Toe Nail Assessment Left: Right: Thick: Yes Discolored: Yes Deformed: No Improper Length and Hygiene: No Electronic Signature(s) Signed: 09/21/2015 3:02:50 PM By: Elpidio EricAfful, Rita BSN, RN Entered By: Elpidio EricAfful, Rita on 09/21/2015 13:22:06 [name], [name] ([id number]) -------------------------------------------------------------------------------- Multi Wound Chart Details Patient Name: Chris RidingHENSLEY, [name] Date of Service: 09/21/2015 1:30 PM Medical Record Number: [id number] Patient Account Number: [id number] Date of Birth/Sex: 1925/06/04 78(82 y.o. Male) Treating RN: Clover MealyAfful, RN, BSN, [place] Sinkita Primary Care Physician: Vonita MossRISSMAN, MARK Other  Clinician: Referring Physician: Vonita MossRISSMAN, MARK Treating Physician/Extender: Maxwell CaulOBSON, MICHAEL G Weeks in Treatment: 9 Vital Signs Height(in): 72 Pulse(bpm): 76 Weight(lbs): 195 Blood Pressure 145/91 (mmHg): Body Mass Index(BMI): 26 Temperature(F): 97.3 Respiratory Rate 17 (breaths/min): Photos: [5:No Photos] [6R:No Photos] [7:No Photos] Wound Location: [5:Left Calcaneus] [6R:Left Foot - Plantar] [7:Left Metatarsal head first] Wounding Event: [5:Blister] [6R:Gradually Appeared] [7:Gradually Appeared] 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] [6R:the Lower Extremity] [7:the Lower Extremity] Comorbid History: [5:Hypertension, Type II Diabetes, History of pressure wounds, Osteoarthritis, Neuropathy Osteoarthritis, Neuropathy Osteoarthritis, Neuropathy] [6R:Hypertension, Type II Diabetes, History of pressure wounds,] [7:Hypertension, Type II  Diabetes, History  of pressure wounds,] Date Acquired: [5:07/11/2015] [6R:07/12/2015] [7:07/11/2015] Weeks of Treatment: [5:9] [6R:9] [7:9] Wound Status: [5:Open] [6R:Open] [7:Open] Wound Recurrence: [5:No] [6R:Yes] [7:No] Measurements L x W x D 0.8x0.8x0.2 [6R:0.9x1x0.1] [7:2.5x2.5x0.2] (cm) Area (cm) : [5:0.503] [6R:0.707] [7:4.909] Volume (cm) : [5:0.101] [6R:0.071] [7:0.982] % Reduction in Area: [5:94.20%] [6R:77.50%] [7:-316.70%] % Reduction in Volume: 88.30% [6R:77.40%] [7:-655.40%] Classification: [5:Grade 1] [6R:Grade 1] [7:Grade 1] Exudate Amount: [5:Large] [6R:Large] [7:Large] Exudate Type: [5:Serosanguineous] [6R:Serosanguineous] [7:Serosanguineous] Exudate Color: [5:red, brown] [6R:red, brown] [7:red, brown] Wound Margin: [5:Distinct, outline attached Distinct, outline attached Distinct, outline attached] Granulation Amount: [5:Small (1-33%)] [6R:Medium (34-66%)] [7:Small (1-33%)] Granulation Quality: [5:Red, Pink] [6R:Pink] [7:Pink] Necrotic Amount: [5:Medium  (34-66%)] [6R:Small (1-33%)] [7:Large (67-100%)] Exposed Structures: [5:Fascia: No Fat: No Tendon: No] [6R:Fascia: No Fat: No Tendon: No] [7:Fascia: No Fat: No Tendon: No] Muscle: No Muscle: No Muscle: No Joint: No Joint: No Joint: No Bone: No Bone: No Bone: No Limited to Skin Limited to Skin Limited to Skin Breakdown Breakdown Breakdown Epithelialization: Small (1-33%) Medium (34-66%) Small (1-33%) Periwound Skin Texture: Edema: Yes Edema: Yes Edema: Yes Excoriation: No Callus: Yes Callus: Yes Induration: No Excoriation: No Excoriation: No Callus: No Induration: No Induration: No Crepitus: No Crepitus: No Crepitus: No Fluctuance: No Fluctuance: No Fluctuance: No Friable: No Friable: No Friable: No Rash: No Rash: No Rash: No Scarring: No Scarring: No Scarring: No Periwound Skin Moist: Yes Moist: Yes Maceration: Yes Moisture: Dry/Scaly: Yes Maceration: No Moist: Yes Maceration: No Dry/Scaly: No Dry/Scaly: No Periwound Skin Color: Atrophie Blanche: No Atrophie Blanche: No Atrophie Blanche: No Cyanosis: No Cyanosis: No Cyanosis: No Ecchymosis: No Ecchymosis: No Ecchymosis: No Erythema: No Erythema: No Erythema: No Hemosiderin Staining: No Hemosiderin Staining: No Hemosiderin Staining: No Mottled: No Mottled: No Mottled: No Pallor: No Pallor: No Pallor: No Rubor: No Rubor: No Rubor: No Temperature: No Abnormality No Abnormality No Abnormality Tenderness on Yes Yes Yes Palpation: Wound Preparation: Ulcer Cleansing: Ulcer Cleansing: Ulcer Cleansing: Rinsed/Irrigated with Rinsed/Irrigated with Rinsed/Irrigated with Saline Saline Saline Topical Anesthetic Topical Anesthetic Topical Anesthetic Applied: Other: lidocaine Applied: Other: lidocaine Applied: Other: lidocaine 4% 4% 4% Treatment Notes Electronic Signature(s) Signed: 09/21/2015 3:02:50 PM By: Elpidio Eric BSN, RN Entered By: Elpidio Eric on 09/21/2015 13:35:59 [name]  ([id number]) -------------------------------------------------------------------------------- Multi-Disciplinary Care Plan Details Patient Name: [name] K. Date of Service: 09/21/2015 1:30 PM Medical Record Number: [id number] Patient Account Number: [id number] Date of Birth/Sex: May 14, 1925 (82 y.o. Male) Treating RN: Clover Mealy, RN, BSN, [place] Sink Primary Care Physician: Vonita Moss Other Clinician: Referring Physician: Vonita Moss Treating Physician/Extender: Altamese [place] in Treatment: 9 Active Inactive Abuse / Safety / Falls / Self Care Management Nursing Diagnoses: Impaired home maintenance Impaired physical mobility Knowledge deficit related to: safety; personal, health (wound), emergency Potential for falls Self care deficit: actual or potential Goals: Patient will remain injury free Date Initiated: 07/20/2015 Goal Status: Active Patient/caregiver will verbalize understanding of skin care regimen Date Initiated: 07/20/2015 Goal Status: Active Patient/caregiver will verbalize/demonstrate measure taken to improve self care Date Initiated: 07/20/2015 Goal Status: Active Patient/caregiver will verbalize/demonstrate measures taken to improve the patient's personal safety Date Initiated: 07/20/2015 Goal Status: Active Patient/caregiver will verbalize/demonstrate measures taken to prevent injury and/or falls Date Initiated: 07/20/2015 Goal Status: Active Patient/caregiver will verbalize/demonstrate understanding of what to do in case of emergency Date Initiated: 07/20/2015 Goal Status: Active Interventions: Assess fall risk on admission and as needed Assess: immobility, friction, shearing, incontinence upon admission and as needed Assess impairment of mobility on admission and as needed per policy  Assess self care needs on admission and as needed Patient referred to community resources (specify in notes) [name], [place]. ([id number]) Provide education on basic  hygiene Provide education on fall prevention Provide education on personal and home safety Provide education on safe transfers Treatment Activities: Education provided on Basic Hygiene : 08/10/2015 Notes: Orientation to the Wound Care Program Nursing Diagnoses: Knowledge deficit related to the wound healing center program Goals: Patient/caregiver will verbalize understanding of the Wound Healing Center Program Date Initiated: 07/20/2015 Goal Status: Active Interventions: Provide education on orientation to the wound center Notes: Wound/Skin Impairment Nursing Diagnoses: Impaired tissue integrity Knowledge deficit related to ulceration/compromised skin integrity Goals: Patient/caregiver will verbalize understanding of skin care regimen Date Initiated: 07/20/2015 Goal Status: Active Ulcer/skin breakdown will have a volume reduction of 50% by week 8 Date Initiated: 07/20/2015 Goal Status: Active Ulcer/skin breakdown will have a volume reduction of 80% by week 12 Date Initiated: 07/20/2015 Goal Status: Active Ulcer/skin breakdown will heal within 14 weeks Date Initiated: 07/20/2015 Goal Status: Active Interventions: Assess patient/caregiver ability to obtain necessary supplies Hoggard, Rosemary K. ([id number]) Assess patient/caregiver ability to perform ulcer/skin care regimen upon admission and as needed Assess ulceration(s) every visit Provide education on ulcer and skin care Treatment Activities: Patient referred to home care : 09/21/2015 Skin care regimen initiated : 09/21/2015 Topical wound management initiated : 09/21/2015 Notes: Electronic Signature(s) Signed: 09/21/2015 3:02:50 PM By: Elpidio Eric BSN, RN Entered By: Elpidio Eric on 09/21/2015 13:35:48 [name], [name] ([id number]) -------------------------------------------------------------------------------- Pain Assessment Details Patient Name: [name] K. Date of Service: 09/21/2015 1:30 PM Medical Record Number:  [id number] Patient Account Number: [id number] Date of Birth/Sex: [birth year] (82 y.o. Male) Treating RN: Clover Mealy, RN, BSN, [place] Sink Primary Care Physician: Vonita Moss Other Clinician: Referring Physician: Vonita Moss Treating Physician/Extender: Altamese [place] in Treatment: 9 Active Problems Location of Pain Severity and Description of Pain Patient Has Paino No Site Locations Pain Management and Medication Current Pain Management: Electronic Signature(s) Signed: 09/21/2015 3:02:50 PM By: Elpidio Eric BSN, RN Entered By: Elpidio Eric on 09/21/2015 13:21:33 [name] ([id number]) -------------------------------------------------------------------------------- Patient/Caregiver Education Details Patient Name: [name] K. Date of Service: 09/21/2015 1:30 PM Medical Record Number: [id number] Patient Account Number: [id number] Date of Birth/Gender: 1924-09-09 (82 y.o. Male) Treating RN: Clover Mealy, RN, BSN, [place] Sink Primary Care Physician: Vonita Moss Other Clinician: Referring Physician: Vonita Moss Treating Physician/Extender: Altamese [place] in Treatment: 9 Education Assessment Education Provided To: Patient Education Topics Provided Basic Hygiene: Methods: Explain/Verbal Responses: State content correctly Safety: Methods: Explain/Verbal Responses: State content correctly Welcome To The [facility]: Methods: Explain/Verbal Responses: State content correctly Wound/Skin Impairment: Methods: Explain/Verbal Responses: State content correctly Electronic Signature(s) Signed: 09/21/2015 3:02:50 PM By: Elpidio Eric BSN, RN Entered By: Elpidio Eric on 09/21/2015 13:53:56 [name], [name] ([id number]) -------------------------------------------------------------------------------- Wound Assessment Details Patient Name: [name] K. Date of Service: 09/21/2015 1:30 PM Medical Record Number: [id number] Patient Account Number: [id number] Date of  Birth/Sex: 1924/11/19 (82 y.o. Male) Treating RN: Clover Mealy, RN, BSN, [place] Sink Primary Care Physician: Vonita Moss Other Clinician: Referring Physician: Vonita Moss Treating Physician/Extender: Maxwell Caul Weeks in Treatment: 9 Wound Status Wound Number: 5 Primary Diabetic Wound/Ulcer of the Lower Etiology: Extremity Wound Location: Left Calcaneus Wound Open Wounding Event: Blister Status: Date Acquired: 07/11/2015 Comorbid Hypertension, Type II Diabetes, History Weeks Of Treatment: 9 History: of pressure wounds, Osteoarthritis, Clustered Wound: No Neuropathy Photos Photo Uploaded By: Elpidio Eric on 09/21/2015 16:45:01 Wound Measurements Length: (cm) 0.8 Width: (cm) 0.8 Depth: (  cm) 0.2 Area: (cm) 0.503 Volume: (cm) 0.101 % Reduction in Area: 94.2% % Reduction in Volume: 88.3% Epithelialization: Small (1-33%) Tunneling: No Undermining: No Wound Description Classification: Grade 1 Wound Margin: Distinct, outline attached Exudate Amount: Large Exudate Type: Serosanguineous Exudate Color: red, brown Foul Odor After Cleansing: No Wound Bed Granulation Amount: Small (1-33%) Exposed Structure Granulation Quality: Red, Pink Fascia Exposed: No Necrotic Amount: Medium (34-66%) Fat Layer Exposed: No Necrotic Quality: Adherent Slough Tendon Exposed: No Terada, Cordero K. ([id number]) Muscle Exposed: No Joint Exposed: No Bone Exposed: No Limited to Skin Breakdown Periwound Skin Texture Texture Color No Abnormalities Noted: No No Abnormalities Noted: No Callus: No Atrophie Blanche: No Crepitus: No Cyanosis: No Excoriation: No Ecchymosis: No Fluctuance: No Erythema: No Friable: No Hemosiderin Staining: No Induration: No Mottled: No Localized Edema: Yes Pallor: No Rash: No Rubor: No Scarring: No Temperature / Pain Moisture Temperature: No Abnormality No Abnormalities Noted: No Tenderness on Palpation: Yes Dry / Scaly: Yes Maceration: No Moist:  Yes Wound Preparation Ulcer Cleansing: Rinsed/Irrigated with Saline Topical Anesthetic Applied: Other: lidocaine 4%, Treatment Notes Wound #5 (Left Calcaneus) 1. Cleansed with: Clean wound with Normal Saline 4. Dressing Applied: Iodoflex 5. Secondary Dressing Applied Gauze and Kerlix/Conform 7. Secured with Secretary/administrator) Signed: 09/21/2015 3:02:50 PM By: Elpidio Eric BSN, RN Entered By: Elpidio Eric on 09/21/2015 13:34:21 [name] ([id number]) -------------------------------------------------------------------------------- Wound Assessment Details Patient Name: [name] K. Date of Service: 09/21/2015 1:30 PM Medical Record Number: [id number] Patient Account Number: [id number] Date of Birth/Sex: 1925/03/21 (82 y.o. Male) Treating RN: Clover Mealy, RN, BSN, Rita Primary Care Physician: Vonita Moss Other Clinician: Referring Physician: Vonita Moss Treating Physician/Extender: Altamese [place] in Treatment: 9 Wound Status Wound Number: 6R Primary Diabetic Wound/Ulcer of the Lower Etiology: Extremity Wound Location: Left Foot - Plantar Wound Open Wounding Event: Gradually Appeared Status: Date Acquired: 07/12/2015 Comorbid Hypertension, Type II Diabetes, History Weeks Of Treatment: 9 History: of pressure wounds, Osteoarthritis, Clustered Wound: No Neuropathy Photos Photo Uploaded By: Elpidio Eric on 09/21/2015 16:45:32 Wound Measurements Length: (cm) 0.9 Width: (cm) 1 Depth: (cm) 0.1 Area: (cm) 0.707 Volume: (cm) 0.071 % Reduction in Area: 77.5% % Reduction in Volume: 77.4% Epithelialization: Medium (34-66%) Tunneling: No Undermining: No Wound Description Classification: Grade 1 Wound Margin: Distinct, outline attached Exudate Amount: Large Exudate Type: Serosanguineous Emery, Shamar K. ([id number]) Foul Odor After Cleansing: No Exudate Color: red, brown Wound Bed Granulation Amount: Medium (34-66%) Exposed  Structure Granulation Quality: Pink Fascia Exposed: No Necrotic Amount: Small (1-33%) Fat Layer Exposed: No Necrotic Quality: Adherent Slough Tendon Exposed: No Muscle Exposed: No Joint Exposed: No Bone Exposed: No Limited to Skin Breakdown Periwound Skin Texture Texture Color No Abnormalities Noted: No No Abnormalities Noted: No Callus: Yes Atrophie Blanche: No Crepitus: No Cyanosis: No Excoriation: No Ecchymosis: No Fluctuance: No Erythema: No Friable: No Hemosiderin Staining: No Induration: No Mottled: No Localized Edema: Yes Pallor: No Rash: No Rubor: No Scarring: No Temperature / Pain Moisture Temperature: No Abnormality No Abnormalities Noted: No Tenderness on Palpation: Yes Dry / Scaly: No Maceration: No Moist: Yes Wound Preparation Ulcer Cleansing: Rinsed/Irrigated with Saline Topical Anesthetic Applied: Other: lidocaine 4%, Treatment Notes Wound #6R (Left, Plantar Foot) 1. Cleansed with: Clean wound with Normal Saline 4. Dressing Applied: Iodoflex 5. Secondary Dressing Applied Gauze and Kerlix/Conform 7. Secured with Secretary/administrator) Signed: 09/21/2015 3:02:50 PM By: Elpidio Eric BSN, RN [name] ([id number]) Entered By: Elpidio Eric on 09/21/2015 13:35:04 [name] ([id number]) -------------------------------------------------------------------------------- Wound Assessment Details  Patient Name: ZIA, KANNER. Date of Service: 09/21/2015 1:30 PM Medical Record Number: [id number] Patient Account Number: [id number] Date of Birth/Sex: [birth year] (82 y.o. Male) Treating RN: Clover Mealy, RN, BSN, Rita Primary Care Physician: Vonita Moss Other Clinician: Referring Physician: Vonita Moss Treating Physician/Extender: Altamese [place] in Treatment: 9 Wound Status Wound Number: 7 Primary Diabetic Wound/Ulcer of the Lower Etiology: Extremity Wound Location: Left Metatarsal head first Wound Open Wounding Event:  Gradually Appeared Status: Date Acquired: 07/11/2015 Comorbid Hypertension, Type II Diabetes, History Weeks Of Treatment: 9 History: of pressure wounds, Osteoarthritis, Clustered Wound: No Neuropathy Photos Photo Uploaded By: Elpidio Eric on 09/21/2015 16:45:32 Wound Measurements Length: (cm) 2.5 Width: (cm) 2.5 Depth: (cm) 0.2 Area: (cm) 4.909 Volume: (cm) 0.982 % Reduction in Area: -316.7% % Reduction in Volume: -655.4% Epithelialization: Small (1-33%) Tunneling: No Undermining: No Wound Description Classification: Grade 1 Wound Margin: Distinct, outline attached Exudate Amount: Large Exudate Type: Serosanguineous Exudate Color: red, brown Foul Odor After Cleansing: No Wound Bed Granulation Amount: Small (1-33%) Exposed Structure Granulation Quality: Pink Fascia Exposed: No Necrotic Amount: Large (67-100%) Fat Layer Exposed: No Necrotic Quality: Adherent Slough Tendon Exposed: No Speir, Yaphet K. ([id number]) Muscle Exposed: No Joint Exposed: No Bone Exposed: No Limited to Skin Breakdown Periwound Skin Texture Texture Color No Abnormalities Noted: No No Abnormalities Noted: No Callus: Yes Atrophie Blanche: No Crepitus: No Cyanosis: No Excoriation: No Ecchymosis: No Fluctuance: No Erythema: No Friable: No Hemosiderin Staining: No Induration: No Mottled: No Localized Edema: Yes Pallor: No Rash: No Rubor: No Scarring: No Temperature / Pain Moisture Temperature: No Abnormality No Abnormalities Noted: No Tenderness on Palpation: Yes Dry / Scaly: No Maceration: Yes Moist: Yes Wound Preparation Ulcer Cleansing: Rinsed/Irrigated with Saline Topical Anesthetic Applied: Other: lidocaine 4%, Treatment Notes Wound #7 (Left Metatarsal head first) 1. Cleansed with: Clean wound with Normal Saline 4. Dressing Applied: Iodoflex 5. Secondary Dressing Applied Gauze and Kerlix/Conform 7. Secured with Secretary/administrator) Signed: 09/21/2015  3:02:50 PM By: Elpidio Eric BSN, RN Entered By: Elpidio Eric on 09/21/2015 13:35:38 [name] ([id number]) -------------------------------------------------------------------------------- Vitals Details Patient Name: [name] K. Date of Service: 09/21/2015 1:30 PM Medical Record Number: [id number] Patient Account Number: [id number] Date of Birth/Sex: February 15, 1925 (82 y.o. Male) Treating RN: Clover Mealy, RN, BSN, Rita Primary Care Physician: Vonita Moss Other Clinician: Referring Physician: Vonita Moss Treating Physician/Extender: Altamese  in Treatment: 9 Vital Signs Time Taken: 13:27 Temperature (F): 97.3 Height (in): 72 Pulse (bpm): 76 Weight (lbs): 195 Respiratory Rate (breaths/min): 17 Body Mass Index (BMI): 26.4 Blood Pressure (mmHg): 145/91 Reference Range: 80 - 120 mg / dl Electronic Signature(s) Signed: 09/21/2015 3:02:50 PM By: Elpidio Eric BSN, RN Entered By: Elpidio Eric on 09/21/2015 13:27:39

## 2015-09-27 ENCOUNTER — Ambulatory Visit (INDEPENDENT_AMBULATORY_CARE_PROVIDER_SITE_OTHER): Payer: PPO | Admitting: Family Medicine

## 2015-09-27 ENCOUNTER — Encounter: Payer: Self-pay | Admitting: Family Medicine

## 2015-09-27 VITALS — BP 106/66 | HR 75 | Temp 97.6°F

## 2015-09-27 DIAGNOSIS — I10 Essential (primary) hypertension: Secondary | ICD-10-CM | POA: Diagnosis not present

## 2015-09-27 DIAGNOSIS — I5032 Chronic diastolic (congestive) heart failure: Secondary | ICD-10-CM | POA: Diagnosis not present

## 2015-09-27 DIAGNOSIS — E1165 Type 2 diabetes mellitus with hyperglycemia: Secondary | ICD-10-CM

## 2015-09-27 DIAGNOSIS — N390 Urinary tract infection, site not specified: Secondary | ICD-10-CM | POA: Diagnosis not present

## 2015-09-27 DIAGNOSIS — R8281 Pyuria: Secondary | ICD-10-CM

## 2015-09-27 DIAGNOSIS — Z0000 Encounter for general adult medical examination without abnormal findings: Secondary | ICD-10-CM

## 2015-09-27 LAB — URINALYSIS, ROUTINE W REFLEX MICROSCOPIC
BILIRUBIN UA: NEGATIVE
GLUCOSE, UA: NEGATIVE
Nitrite, UA: POSITIVE — AB
PH UA: 6.5 (ref 5.0–7.5)
RBC UA: NEGATIVE
Specific Gravity, UA: 1.01 (ref 1.005–1.030)
Urobilinogen, Ur: 2 mg/dL — ABNORMAL HIGH (ref 0.2–1.0)

## 2015-09-27 LAB — MICROSCOPIC EXAMINATION

## 2015-09-27 LAB — MICROALBUMIN, URINE WAIVED
Creatinine, Urine Waived: 50 mg/dL (ref 10–300)
Microalb, Ur Waived: 80 mg/L — ABNORMAL HIGH (ref 0–19)

## 2015-09-27 LAB — BAYER DCA HB A1C WAIVED: HB A1C: 8 % — AB (ref <7.0)

## 2015-09-27 NOTE — Assessment & Plan Note (Signed)
The current medical regimen is effective;  continue present plan and medications.  

## 2015-09-27 NOTE — Progress Notes (Signed)
BP 106/66 mmHg  Pulse 75  Temp(Src) 97.6 F (36.4 C)  Ht   Wt   SpO2 95%   Subjective:    Patient ID: [name], male    DOB: 1925-03-14, 82 y.o.   MRN: [id number]  HPI: [name] is a 82 y.o. male  Chief Complaint  Patient presents with  . Annual Exam  working with wound center on sores on left leg and in the process of getting workup for circulation or osteomyelitis. Here with son who assists with history Reviewed patient's medical problems following with cardiology and stable Patient concerned about fluid and nocturia. No PND no orthopnea Takes medications without problems has 2 home health aides that come in and provide assistance which is needed as patient is wheelchair-bound Medicare physical exam metrics met Relevant past medical, surgical, family and social history reviewed and updated as indicated. Interim medical history since our last visit reviewed. Allergies and medications reviewed and updated.  Other than medical issues Review of Systems  Constitutional: Negative.   HENT: Negative.   Eyes: Negative.   Respiratory: Negative.   Cardiovascular: Negative.   Gastrointestinal: Negative.   Endocrine: Negative.   Genitourinary: Negative.   Musculoskeletal: Negative.   Skin: Negative.   Allergic/Immunologic: Negative.   Neurological: Negative.   Hematological: Negative.   Psychiatric/Behavioral: Negative.     Per HPI unless specifically indicated above     Objective:    BP 106/66 mmHg  Pulse 75  Temp(Src) 97.6 F (36.4 C)  Ht   Wt   SpO2 95%  Wt Readings from Last 3 Encounters:  08/30/15 197 lb (89.359 kg)  07/29/15 195 lb (88.451 kg)  12/07/14 194 lb (87.998 kg)    Physical Exam  Constitutional: He is oriented to person, place, and time. He appears well-developed and well-nourished.  In wheelchair   HENT:  Head: Normocephalic.  Right Ear: External ear normal.  Left Ear: External ear normal.  Nose: Nose normal.  Eyes:  Conjunctivae and EOM are normal. Pupils are equal, round, and reactive to light.  Neck: Normal range of motion. Neck supple. No thyromegaly present.  Cardiovascular: Normal rate, regular rhythm, normal heart sounds and intact distal pulses.   Pulmonary/Chest: Effort normal and breath sounds normal.  Abdominal: Soft. Bowel sounds are normal. There is no splenomegaly or hepatomegaly.  Genitourinary:  Wears diapers and some jock itch  Musculoskeletal:  Limited leg strength Legs not examened  Lymphadenopathy:    He has no cervical adenopathy.  Neurological: He is alert and oriented to person, place, and time. He has normal reflexes.  Skin: Skin is warm and dry.  Psychiatric: He has a normal mood and affect. His behavior is normal. Judgment and thought content normal.    Results for orders placed or performed during the hospital encounter of 40/98/11  Basic metabolic panel  Result Value Ref Range   Sodium 138 135 - 145 mmol/L   Potassium 3.8 3.5 - 5.1 mmol/L   Chloride 106 101 - 111 mmol/L   CO2 26 22 - 32 mmol/L   Glucose, Bld 202 (H) 65 - 99 mg/dL   BUN 23 (H) 6 - 20 mg/dL   Creatinine, Ser 1.19 0.61 - 1.24 mg/dL   Calcium 8.7 (L) 8.9 - 10.3 mg/dL   GFR calc non Af Amer 53 (L) >60 mL/min   GFR calc Af Amer >60 >60 mL/min   Anion gap 6 5 - 15  Glucose, capillary  Result Value Ref Range  Glucose-Capillary 140 (H) 65 - 99 mg/dL      Assessment & Plan:   Problem List Items Addressed This Visit      Cardiovascular and Mediastinum   Chronic diastolic CHF (congestive heart failure) (HCC)    The current medical regimen is effective;  continue present plan and medications.       Essential hypertension    The current medical regimen is effective;  continue present plan and medications.         Endocrine   Poorly controlled type 2 diabetes mellitus (HCC)    A1c of 8 today indicating much better control will continue current care      Relevant Orders   Bayer DCA Hb A1c  Waived    Other Visit Diagnoses    Routine general medical examination at a health care facility    -  Primary    Relevant Orders    CBC with Differential/Platelet    Comprehensive metabolic panel    Lipid Panel w/o Chol/HDL Ratio    TSH    Urinalysis, Routine w reflex microscopic (not at [facility])    Microalbumin, Urine Waived        Follow up plan: Return in about 3 months (around 12/27/2015) for a1c.

## 2015-09-27 NOTE — Addendum Note (Signed)
Addended byVonita Moss: Edwinna Rochette on: 09/27/2015 04:34 PM   Modules accepted: Orders, SmartSet

## 2015-09-27 NOTE — Assessment & Plan Note (Signed)
A1c of 8 today indicating much better control will continue current care

## 2015-09-28 ENCOUNTER — Encounter: Payer: Self-pay | Admitting: Family Medicine

## 2015-09-28 LAB — COMPREHENSIVE METABOLIC PANEL
A/G RATIO: 1.5 (ref 1.2–2.2)
ALT: 8 IU/L (ref 0–44)
AST: 13 IU/L (ref 0–40)
Albumin: 3.8 g/dL (ref 3.5–4.7)
Alkaline Phosphatase: 148 IU/L — ABNORMAL HIGH (ref 39–117)
BILIRUBIN TOTAL: 0.6 mg/dL (ref 0.0–1.2)
BUN/Creatinine Ratio: 19 (ref 10–24)
BUN: 24 mg/dL (ref 8–27)
CHLORIDE: 98 mmol/L (ref 96–106)
CO2: 27 mmol/L (ref 18–29)
Calcium: 9.3 mg/dL (ref 8.6–10.2)
Creatinine, Ser: 1.25 mg/dL (ref 0.76–1.27)
GFR calc non Af Amer: 51 mL/min/{1.73_m2} — ABNORMAL LOW (ref >59)
GFR, EST AFRICAN AMERICAN: 59 mL/min/{1.73_m2} — AB (ref >59)
GLOBULIN, TOTAL: 2.5 g/dL (ref 1.5–4.5)
Glucose: 175 mg/dL — ABNORMAL HIGH (ref 65–99)
POTASSIUM: 4.1 mmol/L (ref 3.5–5.2)
SODIUM: 142 mmol/L (ref 134–144)
Total Protein: 6.3 g/dL (ref 6.0–8.5)

## 2015-09-28 LAB — CBC WITH DIFFERENTIAL/PLATELET
BASOS: 0 %
Basophils Absolute: 0 10*3/uL (ref 0.0–0.2)
EOS (ABSOLUTE): 0.3 10*3/uL (ref 0.0–0.4)
Eos: 3 %
Hematocrit: 41.8 % (ref 37.5–51.0)
Hemoglobin: 14 g/dL (ref 12.6–17.7)
Immature Grans (Abs): 0 10*3/uL (ref 0.0–0.1)
Immature Granulocytes: 0 %
LYMPHS ABS: 1.4 10*3/uL (ref 0.7–3.1)
Lymphs: 14 %
MCH: 31.2 pg (ref 26.6–33.0)
MCHC: 33.5 g/dL (ref 31.5–35.7)
MCV: 93 fL (ref 79–97)
MONOS ABS: 0.6 10*3/uL (ref 0.1–0.9)
Monocytes: 6 %
NEUTROS ABS: 7.6 10*3/uL — AB (ref 1.4–7.0)
Neutrophils: 77 %
PLATELETS: 187 10*3/uL (ref 150–379)
RBC: 4.49 x10E6/uL (ref 4.14–5.80)
RDW: 15 % (ref 12.3–15.4)
WBC: 10 10*3/uL (ref 3.4–10.8)

## 2015-09-28 LAB — TSH: TSH: 3.08 u[IU]/mL (ref 0.450–4.500)

## 2015-09-28 LAB — LIPID PANEL W/O CHOL/HDL RATIO
Cholesterol, Total: 146 mg/dL (ref 100–199)
HDL: 23 mg/dL — AB (ref >39)
LDL Calculated: 73 mg/dL (ref 0–99)
TRIGLYCERIDES: 252 mg/dL — AB (ref 0–149)
VLDL Cholesterol Cal: 50 mg/dL — ABNORMAL HIGH (ref 5–40)

## 2015-09-29 ENCOUNTER — Telehealth: Payer: Self-pay | Admitting: Family Medicine

## 2015-09-29 ENCOUNTER — Ambulatory Visit
Admission: RE | Admit: 2015-09-29 | Discharge: 2015-09-29 | Disposition: A | Discharge status: Home / Self Care | Payer: PPO | Source: Ambulatory Visit | Attending: Internal Medicine | Admitting: Internal Medicine

## 2015-09-29 DIAGNOSIS — R601 Generalized edema: Secondary | ICD-10-CM | POA: Diagnosis not present

## 2015-09-29 DIAGNOSIS — L97521 Non-pressure chronic ulcer of other part of left foot limited to breakdown of skin: Secondary | ICD-10-CM | POA: Diagnosis present

## 2015-09-29 LAB — URINE CULTURE

## 2015-09-29 NOTE — Telephone Encounter (Signed)
Esther from [facility] called stated she would like request a verbal order for physical therapy, she needs someone to strengthen pt's leg and work on transfer. Thanks.  okj

## 2015-09-29 NOTE — Telephone Encounter (Signed)
[facility]

## 2015-09-29 NOTE — Telephone Encounter (Signed)
[name] from [facility] called stated she would like request a verbal order for physical therapy, she needs someone to strengthen pt's leg and work on transfer. Thanks.

## 2015-09-29 NOTE — Telephone Encounter (Signed)
Called to give verbal order for PT to strengthen and work on transfer  No answer and mailbox was full, will try again later

## 2015-10-03 ENCOUNTER — Telehealth: Payer: Self-pay | Admitting: Family Medicine

## 2015-10-03 DIAGNOSIS — N390 Urinary tract infection, site not specified: Secondary | ICD-10-CM

## 2015-10-03 MED ORDER — AMOXICILLIN 875 MG PO TABS: 875.0000 mg | ORAL_TABLET | Freq: Two times a day (BID) | ORAL | Status: DC

## 2015-10-03 NOTE — Telephone Encounter (Signed)
Phone call Discussed with patient urinary tract infection will start medication if not getting better recheck

## 2015-10-03 NOTE — Telephone Encounter (Signed)
-----   Message from Lurlean HornsNancy H Wilson, CMA sent at 10/03/2015  4:16 PM EDT ----- labs

## 2015-10-05 ENCOUNTER — Encounter: Payer: PPO | Attending: Internal Medicine | Admitting: Internal Medicine

## 2015-10-05 DIAGNOSIS — I10 Essential (primary) hypertension: Secondary | ICD-10-CM | POA: Insufficient documentation

## 2015-10-05 DIAGNOSIS — L97522 Non-pressure chronic ulcer of other part of left foot with fat layer exposed: Secondary | ICD-10-CM | POA: Insufficient documentation

## 2015-10-05 DIAGNOSIS — E1151 Type 2 diabetes mellitus with diabetic peripheral angiopathy without gangrene: Secondary | ICD-10-CM | POA: Diagnosis not present

## 2015-10-05 DIAGNOSIS — E11621 Type 2 diabetes mellitus with foot ulcer: Secondary | ICD-10-CM | POA: Diagnosis not present

## 2015-10-05 DIAGNOSIS — L97511 Non-pressure chronic ulcer of other part of right foot limited to breakdown of skin: Secondary | ICD-10-CM | POA: Insufficient documentation

## 2015-10-05 NOTE — Progress Notes (Addendum)
ZHAIRE, LOCKER ([id number]) Visit Report for 10/05/2015 Arrival Information Details Patient Name: [name], [name]. Date of Service: 10/05/2015 12:45 PM Medical Record Number: [id number] Patient Account Number: [id number] Date of Birth/Sex: [birth year] (82 y.o. Male) Treating RN: Clover Mealy, RN, BSN, [place] Sink Primary Care Physician: Vonita Moss Other Clinician: Referring Physician: Vonita Moss Treating Physician/Extender: Altamese [place] in Treatment: 11 Visit Information History Since Last Visit Added or deleted any medications: No Patient Arrived: Wheel Chair Any new allergies or adverse reactions: No Arrival Time: 12:52 Had a fall or experienced change in No activities of daily living that may affect Accompanied By: son risk of falls: Transfer Assistance: None Signs or symptoms of abuse/neglect since last No Patient Identification Verified: Yes visito Secondary Verification Process Yes Hospitalized since last visit: No Completed: Has Dressing in Place as Prescribed: Yes Patient Requires Transmission-Based No Pain Present Now: No Precautions: Patient Has Alerts: No Electronic Signature(s) Signed: 10/05/2015 12:53:13 PM By: Elpidio Eric BSN, RN Entered By: Elpidio Eric on 10/05/2015 12:53:13 [name] ([id number]) -------------------------------------------------------------------------------- Encounter Discharge Information Details Patient Name: Chris Riding K. Date of Service: 10/05/2015 12:45 PM Medical Record Number: [id number] Patient Account Number: [id number] Date of Birth/Sex: 1925-01-22 (82 y.o. Male) Treating RN: Clover Mealy, RN, BSN, [place] Sink Primary Care Physician: Vonita Moss Other Clinician: Referring Physician: Vonita Moss Treating Physician/Extender: Altamese [place] in Treatment: 11 Encounter Discharge Information Items Discharge Pain Level: 0 Discharge Condition: Stable Ambulatory Status: Wheelchair Discharge Destination:  Home Transportation: Private Auto Accompanied By: son Schedule Follow-up Appointment: No Medication Reconciliation completed No and provided to Patient/Care [name]: Provided on Clinical Summary of Care: 10/05/2015 Form Type Recipient Paper Patient [facility] Electronic Signature(s) Signed: 10/05/2015 5:32:09 PM By: Elpidio Eric BSN, RN Previous Signature: 10/05/2015 1:41:48 PM Version By: Gwenlyn Perking Entered By: Elpidio Eric on 10/05/2015 13:42:58 [name], [name] ([id number]) -------------------------------------------------------------------------------- Lower Extremity Assessment Details Patient Name: Chris Riding K. Date of Service: 10/05/2015 12:45 PM Medical Record Number: [id number] Patient Account Number: [id number] Date of Birth/Sex: 10/04/1924 (82 y.o. Male) Treating RN: Clover Mealy, RN, BSN, [place] Sink Primary Care Physician: Vonita Moss Other Clinician: Referring Physician: Vonita Moss Treating Physician/Extender: Altamese [place] in Treatment: 11 Vascular Assessment Pulses: Posterior Tibial Dorsalis Pedis Palpable: [Left:No] Doppler: [Left:Monophasic] Extremity colors, hair growth, and conditions: Extremity Color: [Left:Mottled] Hair Growth on Extremity: [Left:No] Temperature of Extremity: [Left:Warm] Capillary Refill: [Left:< 3 seconds] Toe Nail Assessment Left: Right: Thick: Yes Discolored: Yes Deformed: No Improper Length and Hygiene: No Electronic Signature(s) Signed: 10/05/2015 12:53:42 PM By: Elpidio Eric BSN, RN Entered By: Elpidio Eric on 10/05/2015 12:53:41 [name], [name] ([id number]) -------------------------------------------------------------------------------- Multi Wound Chart Details Patient Name: Chris Riding K. Date of Service: 10/05/2015 12:45 PM Medical Record Number: [id number] Patient Account Number: [id number] Date of Birth/Sex: 15-Mar-1925 (82 y.o. Male) Treating RN: Clover Mealy, RN, BSN, [place] Sink Primary Care Physician: Vonita Moss Other  Clinician: Referring Physician: Vonita Moss Treating Physician/Extender: Altamese [place] in Treatment: 11 Vital Signs Height(in): 72 Pulse(bpm): 73 Weight(lbs): 195 Blood Pressure 118/56 (mmHg): Body Mass Index(BMI): 26 Temperature(F): 97.8 Respiratory Rate 17 (breaths/min): Photos: [5:No Photos] [6R:No Photos] [7:No Photos] Wound Location: [5:Left Calcaneus] [6R:Left Foot - Plantar] [7:Left Metatarsal head first] Wounding Event: [5:Blister] [6R:Gradually Appeared] [7:Gradually Appeared] 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] [6R:the Lower Extremity] [7:the Lower Extremity] Comorbid History: [5:Hypertension, Type II Diabetes, History of pressure wounds, Osteoarthritis, Neuropathy Osteoarthritis, Neuropathy Osteoarthritis, Neuropathy] [6R:Hypertension, Type II Diabetes, History of pressure wounds,] [7:Hypertension, Type II  Diabetes,  History of pressure wounds,] Date Acquired: [5:07/11/2015] [6R:07/12/2015] [7:07/11/2015] Weeks of Treatment: [5:11] [6R:11] [7:11] Wound Status: [5:Open] [6R:Open] [7:Open] Wound Recurrence: [5:No] [6R:Yes] [7:No] Measurements L x W x D 0.5x0.5x0.2 [6R:1x1x0.4] [7:3x3.4x0.2] (cm) Area (cm) : [5:0.196] [6R:0.785] [7:8.011] Volume (cm) : [5:0.039] [6R:0.314] [7:1.602] % Reduction in Area: [5:97.70%] [6R:75.00%] [7:-580.10%] % Reduction in Volume: 95.50% [6R:0.00%] [7:-1132.30%] Classification: [5:Grade 1] [6R:Grade 1] [7:Grade 1] Exudate Amount: [5:Large] [6R:Large] [7:Large] Exudate Type: [5:Serosanguineous] [6R:Serosanguineous] [7:Serosanguineous] Exudate Color: [5:red, brown] [6R:red, brown] [7:red, brown] Wound Margin: [5:Distinct, outline attached Distinct, outline attached Distinct, outline attached] Granulation Amount: [5:Small (1-33%)] [6R:Medium (34-66%)] [7:Small (1-33%)] Granulation Quality: [5:Red, Pink] [6R:Pink] [7:Pink] Necrotic Amount: [5:Medium  (34-66%)] [6R:Small (1-33%)] [7:Large (67-100%)] Exposed Structures: [5:Fascia: No Fat: No Tendon: No] [6R:Fascia: No Fat: No Tendon: No] [7:Fascia: No Fat: No Tendon: No] Muscle: No Muscle: No Muscle: No Joint: No Joint: No Joint: No Bone: No Bone: No Bone: No Limited to Skin Limited to Skin Limited to Skin Breakdown Breakdown Breakdown Epithelialization: Small (1-33%) Medium (34-66%) Small (1-33%) Periwound Skin Texture: Edema: Yes Edema: Yes Edema: Yes Excoriation: No Callus: Yes Callus: Yes Induration: No Excoriation: No Excoriation: No Callus: No Induration: No Induration: No Crepitus: No Crepitus: No Crepitus: No Fluctuance: No Fluctuance: No Fluctuance: No Friable: No Friable: No Friable: No Rash: No Rash: No Rash: No Scarring: No Scarring: No Scarring: No Periwound Skin Moist: Yes Moist: Yes Maceration: Yes Moisture: Dry/Scaly: Yes Maceration: No Moist: Yes Maceration: No Dry/Scaly: No Dry/Scaly: No Periwound Skin Color: Atrophie Blanche: No Atrophie Blanche: No Atrophie Blanche: No Cyanosis: No Cyanosis: No Cyanosis: No Ecchymosis: No Ecchymosis: No Ecchymosis: No Erythema: No Erythema: No Erythema: No Hemosiderin Staining: No Hemosiderin Staining: No Hemosiderin Staining: No Mottled: No Mottled: No Mottled: No Pallor: No Pallor: No Pallor: No Rubor: No Rubor: No Rubor: No Temperature: No Abnormality No Abnormality No Abnormality Tenderness on Yes Yes Yes Palpation: Wound Preparation: Ulcer Cleansing: Ulcer Cleansing: Ulcer Cleansing: Rinsed/Irrigated with Rinsed/Irrigated with Rinsed/Irrigated with Saline Saline Saline Topical Anesthetic Topical Anesthetic Topical Anesthetic Applied: Other: lidocaine Applied: Other: lidocaine Applied: Other: lidocaine 4% 4% 4% Treatment Notes Electronic Signature(s) Signed: 10/05/2015 1:17:59 PM By: Elpidio EricAfful, [name] BSN, RN Entered By: Elpidio EricAfful, [name] on 10/05/2015 13:17:59 Chris RusselHENSLEY, [name]  ([id number]) -------------------------------------------------------------------------------- Multi-Disciplinary Care Plan Details Patient Name: Chris RidingHENSLEY, [name] Date of Service: 10/05/2015 12:45 PM Medical Record Number: [id number] Patient Account Number: [id number] Date of Birth/Sex: [birth year](82 y.o. Male) Treating RN: Clover MealyAfful, RN, BSN, [place] Primary Care Physician: Vonita MossRISSMAN, MARK Other Clinician: Referring Physician: Vonita MossRISSMAN, MARK Treating Physician/Extender: Altamese CarolinaOBSON, MICHAEL G Weeks in Treatment: 11 Active Inactive Abuse / Safety / Falls / Self Care Management Nursing Diagnoses: Impaired home maintenance Impaired physical mobility Knowledge deficit related to: safety; personal, health (wound), emergency Potential for falls Self care deficit: actual or potential Goals: Patient will remain injury free Date Initiated: 07/20/2015 Goal Status: Active Patient/caregiver will verbalize understanding of skin care regimen Date Initiated: 07/20/2015 Goal Status: Active Patient/caregiver will verbalize/demonstrate measure taken to improve self care Date Initiated: 07/20/2015 Goal Status: Active Patient/caregiver will verbalize/demonstrate measures taken to improve the patient's personal safety Date Initiated: 07/20/2015 Goal Status: Active Patient/caregiver will verbalize/demonstrate measures taken to prevent injury and/or falls Date Initiated: 07/20/2015 Goal Status: Active Patient/caregiver will verbalize/demonstrate understanding of what to do in case of emergency Date Initiated: 07/20/2015 Goal Status: Active Interventions: Assess fall risk on admission and as needed Assess: immobility, friction, shearing, incontinence upon admission and as needed Assess impairment of mobility on admission and as needed per  policy Assess self care needs on admission and as needed Patient referred to community resources (specify in notes) ALMANDO, BRAWLEY. ([id number]) Provide education on basic  hygiene Provide education on fall prevention Provide education on personal and home safety Provide education on safe transfers Treatment Activities: Education provided on Basic Hygiene : 09/21/2015 Notes: Orientation to the Wound Care Program Nursing Diagnoses: Knowledge deficit related to the wound healing center program Goals: Patient/caregiver will verbalize understanding of the Wound Healing Center Program Date Initiated: 07/20/2015 Goal Status: Active Interventions: Provide education on orientation to the wound center Notes: Wound/Skin Impairment Nursing Diagnoses: Impaired tissue integrity Knowledge deficit related to ulceration/compromised skin integrity Goals: Patient/caregiver will verbalize understanding of skin care regimen Date Initiated: 07/20/2015 Goal Status: Active Ulcer/skin breakdown will have a volume reduction of 50% by week 8 Date Initiated: 07/20/2015 Goal Status: Active Ulcer/skin breakdown will have a volume reduction of 80% by week 12 Date Initiated: 07/20/2015 Goal Status: Active Ulcer/skin breakdown will heal within 14 weeks Date Initiated: 07/20/2015 Goal Status: Active Interventions: Assess patient/caregiver ability to obtain necessary supplies Gayler, [name] ([id number]) Assess patient/caregiver ability to perform ulcer/skin care regimen upon admission and as needed Assess ulceration(s) every visit Provide education on ulcer and skin care Treatment Activities: Patient referred to home care : 09/21/2015 Skin care regimen initiated : 09/21/2015 Topical wound management initiated : 09/21/2015 Notes: Electronic Signature(s) Signed: 10/05/2015 1:17:48 PM By: Elpidio Eric BSN, RN Entered By: Elpidio Eric on 10/05/2015 13:17:48 Grajeda, [name] ([id number]) -------------------------------------------------------------------------------- Pain Assessment Details Patient Name: Chris Riding K. Date of Service: 10/05/2015 12:45 PM Medical Record Number:  [id number] Patient Account Number: [id number] Date of Birth/Sex: Mar 30, 1925 (82 y.o. Male) Treating RN: Clover Mealy, RN, BSN, [place] Sink Primary Care Physician: Vonita Moss Other Clinician: Referring Physician: Vonita Moss Treating Physician/Extender: Altamese [place] in Treatment: 11 Active Problems Location of Pain Severity and Description of Pain Patient Has Paino No Site Locations Pain Management and Medication Current Pain Management: Electronic Signature(s) Signed: 10/05/2015 12:53:22 PM By: Elpidio Eric BSN, RN Entered By: Elpidio Eric on 10/05/2015 12:53:22 [name] ([id number]) -------------------------------------------------------------------------------- Patient/Caregiver Education Details Patient Name: Chris Riding K. Date of Service: 10/05/2015 12:45 PM Medical Record Number: [id number] Patient Account Number: [id number] Date of Birth/Gender: 01-09-1925 (82 y.o. Male) Treating RN: Clover Mealy, RN, BSN, [place] Sink Primary Care Physician: Vonita Moss Other Clinician: Referring Physician: Vonita Moss Treating Physician/Extender: Altamese [place] in Treatment: 11 Education Assessment Education Provided To: Patient Education Topics Provided Basic Hygiene: Methods: Explain/Verbal Responses: State content correctly Safety: Methods: Explain/Verbal Responses: State content correctly Welcome To The [facility]: Methods: Explain/Verbal Responses: State content correctly Wound/Skin Impairment: Methods: Explain/Verbal Responses: State content correctly Electronic Signature(s) Signed: 10/05/2015 5:32:09 PM By: Elpidio Eric BSN, RN Entered By: Elpidio Eric on 10/05/2015 13:43:20 [place], [name] ([id number]) -------------------------------------------------------------------------------- Wound Assessment Details Patient Name: Chris Riding K. Date of Service: 10/05/2015 12:45 PM Medical Record Number: [id number] Patient Account Number: [id number] Date of  Birth/Sex: 05-Jul-1924 (82 y.o. Male) Treating RN: Clover Mealy, RN, BSN, [place] Sink Primary Care Physician: Vonita Moss Other Clinician: Referring Physician: Vonita Moss Treating Physician/Extender: Maxwell Caul Weeks in Treatment: 11 Wound Status Wound Number: 5 Primary Diabetic Wound/Ulcer of the Lower Etiology: Extremity Wound Location: Left Calcaneus Wound Open Wounding Event: Blister Status: Date Acquired: 07/11/2015 Comorbid Hypertension, Type II Diabetes, History Weeks Of Treatment: 11 History: of pressure wounds, Osteoarthritis, Clustered Wound: No Neuropathy Photos Photo Uploaded By: Elpidio Eric on 10/05/2015 16:42:04 Wound Measurements Length: (cm) 0.5 Width: (cm) 0.5  Depth: (cm) 0.2 Area: (cm) 0.196 Volume: (cm) 0.039 % Reduction in Area: 97.7% % Reduction in Volume: 95.5% Epithelialization: Small (1-33%) Tunneling: No Undermining: No Wound Description Classification: Grade 1 Wound Margin: Distinct, outline attached Exudate Amount: Large Exudate Type: Serosanguineous Exudate Color: red, brown Foul Odor After Cleansing: No Wound Bed Granulation Amount: Small (1-33%) Exposed Structure Granulation Quality: Red, Pink Fascia Exposed: No Necrotic Amount: Medium (34-66%) Fat Layer Exposed: No Necrotic Quality: Adherent Slough Tendon Exposed: No Willis, Yang K. ([id number]) Muscle Exposed: No Joint Exposed: No Bone Exposed: No Limited to Skin Breakdown Periwound Skin Texture Texture Color No Abnormalities Noted: No No Abnormalities Noted: No Callus: No Atrophie Blanche: No Crepitus: No Cyanosis: No Excoriation: No Ecchymosis: No Fluctuance: No Erythema: No Friable: No Hemosiderin Staining: No Induration: No Mottled: No Localized Edema: Yes Pallor: No Rash: No Rubor: No Scarring: No Temperature / Pain Moisture Temperature: No Abnormality No Abnormalities Noted: No Tenderness on Palpation: Yes Dry / Scaly: Yes Maceration: No Moist:  Yes Wound Preparation Ulcer Cleansing: Rinsed/Irrigated with Saline Topical Anesthetic Applied: Other: lidocaine 4%, Treatment Notes Wound #5 (Left Calcaneus) 1. Cleansed with: Clean wound with Normal Saline 4. Dressing Applied: Iodoflex 5. Secondary Dressing Applied Gauze and Kerlix/Conform 7. Secured with Secretary/administrator) Signed: 10/05/2015 5:32:09 PM By: Elpidio Eric BSN, RN Entered By: Elpidio Eric on 10/05/2015 13:11:42 [name] ([id number]) -------------------------------------------------------------------------------- Wound Assessment Details Patient Name: Chris Riding K. Date of Service: 10/05/2015 12:45 PM Medical Record Number: [id number] Patient Account Number: [id number] Date of Birth/Sex: [birth year] (82 y.o. Male) Treating RN: Clover Mealy, RN, BSN, [name] Primary Care Physician: Vonita Moss Other Clinician: Referring Physician: Vonita Moss Treating Physician/Extender: Altamese [place] in Treatment: 11 Wound Status Wound Number: 6R Primary Diabetic Wound/Ulcer of the Lower Etiology: Extremity Wound Location: Left Foot - Plantar Wound Open Wounding Event: Gradually Appeared Status: Date Acquired: 07/12/2015 Comorbid Hypertension, Type II Diabetes, History Weeks Of Treatment: 11 History: of pressure wounds, Osteoarthritis, Clustered Wound: No Neuropathy Photos Photo Uploaded By: Elpidio Eric on 10/05/2015 16:42:04 Wound Measurements Length: (cm) 1 Width: (cm) 1 Depth: (cm) 0.4 Area: (cm) 0.785 Volume: (cm) 0.314 % Reduction in Area: 75% % Reduction in Volume: 0% Epithelialization: Medium (34-66%) Tunneling: No Undermining: No Wound Description Classification: Grade 1 Wound Margin: Distinct, outline attached Exudate Amount: Large Exudate Type: Serosanguineous Exudate Color: red, brown Foul Odor After Cleansing: No Wound Bed Granulation Amount: Medium (34-66%) Exposed Structure Granulation Quality: Pink Fascia Exposed:  No Necrotic Amount: Small (1-33%) Fat Layer Exposed: No Necrotic Quality: Adherent Slough Tendon Exposed: No Sitts, [name] ([id number]) Muscle Exposed: No Joint Exposed: No Bone Exposed: No Limited to Skin Breakdown Periwound Skin Texture Texture Color No Abnormalities Noted: No No Abnormalities Noted: No Callus: Yes Atrophie Blanche: No Crepitus: No Cyanosis: No Excoriation: No Ecchymosis: No Fluctuance: No Erythema: No Friable: No Hemosiderin Staining: No Induration: No Mottled: No Localized Edema: Yes Pallor: No Rash: No Rubor: No Scarring: No Temperature / Pain Moisture Temperature: No Abnormality No Abnormalities Noted: No Tenderness on Palpation: Yes Dry / Scaly: No Maceration: No Moist: Yes Wound Preparation Ulcer Cleansing: Rinsed/Irrigated with Saline Topical Anesthetic Applied: Other: lidocaine 4%, Treatment Notes Wound #6R (Left, Plantar Foot) 1. Cleansed with: Clean wound with Normal Saline 4. Dressing Applied: Iodoflex 5. Secondary Dressing Applied Gauze and Kerlix/Conform 7. Secured with Secretary/administrator) Signed: 10/05/2015 1:17:06 PM By: Elpidio Eric BSN, RN Entered By: Elpidio Eric on 10/05/2015 13:17:06 [name] ([id number]) -------------------------------------------------------------------------------- Wound Assessment Details Patient Name: [name],  [name] Date of Service: 10/05/2015 12:45 PM Medical Record Number: [id number] Patient Account Number: [id number] Date of Birth/Sex: 01-16-1925 (82 y.o. Male) Treating RN: Clover Mealy, RN, BSN, [name] Primary Care Physician: Vonita Moss Other Clinician: Referring Physician: Vonita Moss Treating Physician/Extender: Altamese [employer] in Treatment: 11 Wound Status Wound Number: 7 Primary Diabetic Wound/Ulcer of the Lower Etiology: Extremity Wound Location: Left Metatarsal head first Wound Open Wounding Event: Gradually Appeared Status: Date Acquired:  07/11/2015 Comorbid Hypertension, Type II Diabetes, History Weeks Of Treatment: 11 History: of pressure wounds, Osteoarthritis, Clustered Wound: No Neuropathy Photos Photo Uploaded By: Elpidio Eric on 10/05/2015 16:42:05 Wound Measurements Length: (cm) 3 Width: (cm) 3.4 Depth: (cm) 0.2 Area: (cm) 8.011 Volume: (cm) 1.602 % Reduction in Area: -580.1% % Reduction in Volume: -1132.3% Epithelialization: Small (1-33%) Tunneling: No Undermining: No Wound Description Classification: Grade 1 Wound Margin: Distinct, outline attached Exudate Amount: Large Exudate Type: Serosanguineous Exudate Color: red, brown Foul Odor After Cleansing: No Wound Bed Granulation Amount: Small (1-33%) Exposed Structure Granulation Quality: Pink Fascia Exposed: No Necrotic Amount: Large (67-100%) Fat Layer Exposed: No Necrotic Quality: Adherent Slough Tendon Exposed: No Spagnoli, [name] ([id number]) Muscle Exposed: No Joint Exposed: No Bone Exposed: No Limited to Skin Breakdown Periwound Skin Texture Texture Color No Abnormalities Noted: No No Abnormalities Noted: No Callus: Yes Atrophie Blanche: No Crepitus: No Cyanosis: No Excoriation: No Ecchymosis: No Fluctuance: No Erythema: No Friable: No Hemosiderin Staining: No Induration: No Mottled: No Localized Edema: Yes Pallor: No Rash: No Rubor: No Scarring: No Temperature / Pain Moisture Temperature: No Abnormality No Abnormalities Noted: No Tenderness on Palpation: Yes Dry / Scaly: No Maceration: Yes Moist: Yes Wound Preparation Ulcer Cleansing: Rinsed/Irrigated with Saline Topical Anesthetic Applied: Other: lidocaine 4%, Treatment Notes Wound #7 (Left Metatarsal head first) 1. Cleansed with: Clean wound with Normal Saline 4. Dressing Applied: Iodoflex 5. Secondary Dressing Applied Gauze and Kerlix/Conform 7. Secured with Secretary/administrator) Signed: 10/05/2015 1:17:33 PM By: Elpidio Eric BSN, RN Entered By:  Elpidio Eric on 10/05/2015 13:17:32 [name] ([id number]) -------------------------------------------------------------------------------- Vitals Details Patient Name: Chris Riding K. Date of Service: 10/05/2015 12:45 PM Medical Record Number: [id number] Patient Account Number: [id number] Date of Birth/Sex: June 17, 1924 (82 y.o. Male) Treating RN: Clover Mealy, RN, BSN, [name] Primary Care Physician: Vonita Moss Other Clinician: Referring Physician: Vonita Moss Treating Physician/Extender: Altamese [place] in Treatment: 11 Vital Signs Time Taken: 13:01 Temperature (F): 97.8 Height (in): 72 Pulse (bpm): 73 Weight (lbs): 195 Respiratory Rate (breaths/min): 17 Body Mass Index (BMI): 26.4 Blood Pressure (mmHg): 118/56 Reference Range: 80 - 120 mg / dl Electronic Signature(s) Signed: 10/05/2015 5:32:09 PM By: Elpidio Eric BSN, RN Entered By: Elpidio Eric on 10/05/2015 13:03:05

## 2015-10-06 NOTE — Progress Notes (Signed)
GEOFFERY, AULTMAN ([id number]) Visit Report for 10/05/2015 Chief Complaint Document Details Patient Name: [name], [name]. Date of Service: 10/05/2015 12:45 PM Medical Record Patient Account Number: [id number] Number: Treating RN: Clover Mealy, RN, BSN, Rita Oct 16, 1924 [phone] y.o. Other Clinician: Date of Birth/Sex: Male) Treating ROBSON, MICHAEL Primary Care Physician: Vonita Moss Physician/Extender: G Referring Physician: Vonita Moss Weeks in Treatment: 11 Information Obtained from: Patient Chief Complaint Has had swelling of both lower extremities with some ulceration on the right lower extremity. 07/20/15 patient returns today predominantly for a painful wound on the plantar aspect of his left foot of recent onset. Electronic Signature(s) Signed: 10/06/2015 1:24:33 PM By: Baltazar Najjar MD Entered By: Baltazar Najjar on 10/05/2015 13:44:06 [name] ([id number]) -------------------------------------------------------------------------------- Debridement Details Patient Name: Chris Riding K. Date of Service: 10/05/2015 12:45 PM Medical Record Patient Account Number: [id number] Number: Treating RN: Clover Mealy, RN, BSN, Rita 03-14-1925 [phone] y.o. Other Clinician: Date of Birth/Sex: Male) Treating ROBSON, MICHAEL Primary Care Physician: Vonita Moss Physician/Extender: G Referring Physician: Vonita Moss Weeks in Treatment: 11 Debridement Performed for Wound #5 Left Calcaneus Assessment: Performed By: Physician Maxwell Caul, MD Debridement: Debridement Pre-procedure Yes Verification/Time Out Taken: Start Time: 13:22 Pain Control: Lidocaine 4% Topical Solution Level: Skin/Subcutaneous Tissue Total Area Debrided (L x 0.5 (cm) x 0.5 (cm) = 0.25 (cm) W): Tissue and other Exudate, Fibrin/Slough, Subcutaneous material debrided: Instrument: Curette Bleeding: Minimum Hemostasis Achieved: Pressure End Time: 13:27 Procedural Pain: 0 Post Procedural  Pain: 0 Response to Treatment: Procedure was tolerated well Post Debridement Measurements of Total Wound Length: (cm) 0.5 Width: (cm) 0.5 Depth: (cm) 0.2 Volume: (cm) 0.039 Post Procedure Diagnosis Same as Pre-procedure Electronic Signature(s) Signed: 10/05/2015 5:32:09 PM By: Elpidio Eric BSN, RN Signed: 10/06/2015 1:24:33 PM By: Baltazar Najjar MD Entered By: Baltazar Najjar on 10/05/2015 13:43:20 [name], [name] ([id number]) -------------------------------------------------------------------------------- Debridement Details Patient Name: Chris Riding K. Date of Service: 10/05/2015 12:45 PM Medical Record Patient Account Number: [id number] Number: Treating RN: Clover Mealy, RN, BSN, Rita 12/13/1924 [phone] y.o. Other Clinician: Date of Birth/Sex: Male) Treating ROBSON, MICHAEL Primary Care Physician: Dossie Arbour, MARK Physician/Extender: G Referring Physician: Vonita Moss Weeks in Treatment: 11 Debridement Performed for Wound #6R Left,Plantar Foot Assessment: Performed By: Physician Maxwell Caul, MD Debridement: Debridement Pre-procedure Yes Verification/Time Out Taken: Start Time: 13:17 Pain Control: Lidocaine 4% Topical Solution Level: Skin/Subcutaneous Tissue Total Area Debrided (L x 1 (cm) x 1 (cm) = 1 (cm) W): Tissue and other Non-Viable, Exudate, Fibrin/Slough, Subcutaneous material debrided: Instrument: Curette Bleeding: Minimum Hemostasis Achieved: Pressure End Time: 13:22 Procedural Pain: 0 Post Procedural Pain: 0 Response to Treatment: Procedure was tolerated well Post Debridement Measurements of Total Wound Length: (cm) 1 Width: (cm) 1 Depth: (cm) 0.4 Volume: (cm) 0.314 Post Procedure Diagnosis Same as Pre-procedure Electronic Signature(s) Signed: 10/05/2015 5:32:09 PM By: Elpidio Eric BSN, RN Signed: 10/06/2015 1:24:33 PM By: Baltazar Najjar MD Entered By: Baltazar Najjar on 10/05/2015 13:43:39 [name], [name]  ([id number]) -------------------------------------------------------------------------------- Debridement Details Patient Name: Chris Riding K. Date of Service: 10/05/2015 12:45 PM Medical Record Patient Account Number: [id number] Number: Treating RN: Clover Mealy, RN, BSN, Rita 1925-03-24 [phone] y.o. Other Clinician: Date of Birth/Sex: Male) Treating ROBSON, MICHAEL Primary Care Physician: Vonita Moss Physician/Extender: G Referring Physician: Vonita Moss Weeks in Treatment: 11 Debridement Performed for Wound #7 Left Metatarsal head first Assessment: Performed By: Physician Maxwell Caul, MD Debridement: Debridement Pre-procedure Yes Verification/Time Out Taken: Start Time: 13:11 Pain Control: Lidocaine 4% Topical Solution Level: Skin/Subcutaneous Tissue Total Area  Debrided (L x 3 (cm) x 3.4 (cm) = 10.2 (cm) W): Tissue and other Exudate, Fibrin/Slough, Subcutaneous material debrided: Instrument: Curette Bleeding: Minimum Hemostasis Achieved: Pressure End Time: 13:17 Procedural Pain: 0 Post Procedural Pain: 0 Response to Treatment: Procedure was tolerated well Post Debridement Measurements of Total Wound Length: (cm) 3 Width: (cm) 3.4 Depth: (cm) 0.2 Volume: (cm) 1.602 Post Procedure Diagnosis Same as Pre-procedure Electronic Signature(s) Signed: 10/05/2015 5:32:09 PM By: Regan Lemming BSN, RN Signed: 10/06/2015 1:24:33 PM By: [name] MD Entered By: [name] on 10/05/2015 13:43:54 [name], [name] ([id number]) -------------------------------------------------------------------------------- HPI Details Patient Name: Chris Bottoms K. Date of Service: 10/05/2015 12:45 PM Medical Record Patient Account Number: [id number] Number: Treating RN: Baruch Gouty, RN, BSN, Rita Aug 05, 1924 [phone] y.o. Other Clinician: Date of Birth/Sex: Male) Treating ROBSON, MICHAEL Primary Care Physician: Golden Pop Physician/Extender: G Referring Physician:  Golden Pop Weeks in Treatment: 11 History of Present Illness Location: had swelling of the right lower extremity with some ulceration on the medial part of his calf and on the right fifth toe Quality: Patient reports No Pain. Severity: Patient states wound (s) are getting better. Duration: Patient has had the wound for < 2 weeks prior to presenting for treatment Context: The wound appeared gradually over time. nail on his right fifth toe came off loose and he pried it Modifying Factors: Other treatment(s) tried include:vascular workup where he is going to be seeing Dr. Lucky Cowboy on Monday Associated Signs and Symptoms: Patient reports presence of swelling HPI Description: Very pleasant 82 year old with a past medical history significant for type 2 diabetes. He underwent left knee surgery in February 2015. He had an angioplasty of his R posterior tibial on 10/30/13. Vascular workup done in April of this year showed an ABI on the right to be noncompressible and the left to be 0.86. About a week ago he noticed some swelling on the right lower extremity and then an ulceration on the medial part of his calf. He also noted that the right fifth nail bed had a loose nail and he pried it open and now has an open wound. He has no evidence of cellulitis. he was seen by the nurse practitioner at his PCPs office and put on Bactroban ointment and doxycycline for 10 days. 01/21/2015 -- he was seen by Dr. Ronalee Belts and though we do not have the note, we know he had used an Unna's boot and asked him to come to see as again for a compression bandage. The patient has no fresh complaints. 07/20/15; the patient returns today predominantly for her wounds on his left plantar foot. He was seen here in the summer last on 8/19 for wounds on his right medial calf and right fifth nail bed. I don't know that he actually returned to be discharged, he is wearing compression on bilateral legs. He has known peripheral arterial  disease in the setting of type 2 diabetes having an angioplasty on the right posterior tibial artery on 10/30/13. His ABI calculated in the clinic today was 0.97 on the right. I don't think the left could be done. The patient tells me that roughly a week ago he started to develop pain in the plantar aspect of his left foot. I think his caregiver noticed the wounds and he is here for our review events. He is minimally ambulatory, spends most of his time in a wheelchair. I am not certain of the issue here. 07/27/15; the patient is here for 3 wounds. The first over his left  first met head, second over the left medial heel and the third over the left fourth metatarsal head. Culture from the fourth metatarsal head last week showed Klebsiella. This would not be sensitive to the doxycycline possibly I have therefore changed him to Keflex to which the Klebsiella should be sensitive 08/10/15; patient returns today. He was not seen last week. He does have home health. The area over the left fourth metatarsal head is almost completely closed the area over the medial aspect of his left first metatarsal head and the left medial heel still required debridement [name], [name] ([id number]) 08/17/15 the area over the fourth metatarsal head has resolved. The area on his left first metatarsal head lateral aspect and the left medial heel still require surgical debridement although the surface is beginning to look a bit better. Is followed with vascular surgery and is apparently being planned for an angiogram and possible stent on 3/28 08/23/15 the patient complained of pain over his fourth metatarsal head, the previously closed wound. Physical exam did not really show a source he had a small amount of callus which I lightly removed to expose a large amount of purulent drainage. The area was then fully debridement the area on the first metatarsal head lateral aspect and left medial heel appear improved  [Iodosorb] 08/31/15; the patient is completed a well-tolerated angioplasty of the left posterior tibial artery. The left common femoral profunda femoris and superficial femoral arteries are diffusely diseased but there was no hemodynamically significant lesion. Culture I did last week from the left fourth metatarsal head enterococcus I'm going to give him another week of Keflex which should cover this. Patient states he had some pain but it seems to have resolved in the sole of his foot 09/07/15 he has completed his Keflex yesterday. 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 Using Iodoflex. He is completed antibiotics. 09/21/15; MRI next week, we will see him in 2 weeks. 10/05/15; miraculously this patient's MRI did not show osteomyelitis in his left foot.in [place] of the very deep wound on the lateral aspect of his first metatarsal phalangeal, and a very deep reoccurring wound over the plantar aspect of his left fourth metatarsal head there is no osteomyelitis!!! He did call in yesterday to report marked swelling in the left leg with some tenderness in his knee. He apparently saw his primary physician who gave him antibiotics for a UTI although I'm not exactly sure what these antibiotics were. He has been using it Run[employer], broadcasting/film/video) Signed: 10/06/2015 1:24:33 PM By: [name] MD Entered By: [name] on 10/05/2015 13:47:41 [place]al[place], [name] ([id number]) -------------------------------------------------------------------------------- Physical Exam Details Patient Name: Chris Bottoms K. Date of Service: 10/05/2015 12:45 PM Medical Record Patient Account Number: [id number] Number: Treating RN: Baruch Gouty, RN, BSN, Rita 1924/08/31 [phone] y.o. Other Clinician: Date of Birth/Sex: Male) Treating ROBSON, MICHAEL Primary Care Physician: Golden Pop Physician/Extender: G Referring Physician: Golden Pop Weeks in Treatment: 11 Constitutional Sitting or standing Blood Pressure is within target range for patient.. Pulse regular and within target range for patient.Marland Kitchen Respirations regular, non-labored and within target range.. Temperature is normal and within the target range for the patient.. Patient's appearance is neat and clean. Appears in no acute distress. Well nourished and well developed.. Cardiovascular Edema present in both extremities. There is however swelling tenderness in the calf on the  left leg give. There is enough here to make a DVT rule out necessary. Musculoskeletal Left knee is warm but without an effusion. I suspect this is probably arthritis of some type probably osteo-he has a flexion contracture of this knee as well. Notes Wound exam; the patient has a deep wound on the lateral aspect of the left first metatarsal head. Aggressive debridement done, now that I know there is no underlying osteomyelitis I will try to treat this very aggressively. Also want aggressive debridement done of the recurrent wound on the fourth met head on the plantar aspect. The area on his lateral heel is also debridement but appears to be less ominous Electronic Signature(s) Signed: 10/06/2015 1:24:33 PM By: Baltazar Najjar MD Entered By: Baltazar Najjar on 10/05/2015 13:52:28 [name] ([id number]) -------------------------------------------------------------------------------- Physician Orders Details Patient Name: Chris Riding K. Date of Service: 10/05/2015 12:45 PM Medical Record Patient Account Number: [id number] Number: Treating RN: Clover Mealy, RN, BSN, Rita 24-Dec-1924 [phone] y.o. Other Clinician: Date of Birth/Sex: Male) Treating ROBSON, MICHAEL Primary Care Physician: Vonita Moss Physician/Extender: G Referring Physician: Vonita Moss Weeks in Treatment: 74 Verbal / Phone Orders: Yes Clinician: Afful,  RN, BSN, Rita Read Back and Verified: Yes Diagnosis Coding Wound Cleansing Wound #5 Left Calcaneus o Cleanse wound with mild soap and water Wound #6R Left,Plantar Foot o Cleanse wound with mild soap and water Wound #7 Left Metatarsal head first o Cleanse wound with mild soap and water Anesthetic Wound #5 Left Calcaneus o Topical Lidocaine 4% cream applied to wound bed prior to debridement Wound #6R Left,Plantar Foot o Topical Lidocaine 4% cream applied to wound bed prior to debridement Wound #7 Left Metatarsal head first o Topical Lidocaine 4% cream applied to wound bed prior to debridement Primary Wound Dressing Wound #5 Left Calcaneus o Iodoflex Wound #6R Left,Plantar Foot o Iodoflex Wound #7 Left Metatarsal head first o Iodoflex Secondary Dressing Wound #5 Left Calcaneus o Gauze and Kerlix/Conform Wound #6R Left,Plantar Foot Wendorff, Gurveer K. ([id number]) o Gauze and Kerlix/Conform Wound #7 Left Metatarsal head first o Gauze and Kerlix/Conform Dressing Change Frequency Wound #5 Left Calcaneus o Change dressing every day. Wound #6R Left,Plantar Foot o Change dressing every day. Wound #7 Left Metatarsal head first o Change dressing every day. Follow-up Appointments Wound #5 Left Calcaneus o Return Appointment in 2 weeks. Wound #6R Left,Plantar Foot o Return Appointment in 2 weeks. Wound #7 Left Metatarsal head first o Return Appointment in 2 weeks. Off-Loading Wound #5 Left Calcaneus o Turn and reposition every 2 hours Wound #6R Left,Plantar Foot o Turn and reposition every 2 hours Wound #7 Left Metatarsal head first o Turn and reposition every 2 hours Additional Orders / Instructions Wound #5 Left Calcaneus o Increase protein intake. o Activity as tolerated Wound #6R Left,Plantar Foot o Increase protein intake. o Activity as tolerated Wound #7 Left Metatarsal head first o Increase protein intake. o  Activity as tolerated [name], [name] ([id number]) Home Health Wound #5 Left Calcaneus o Continue Home Health Visits - [facility] o Home Health Nurse may visit PRN to address patientos wound care needs. o FACE TO FACE ENCOUNTER: MEDICARE and MEDICAID PATIENTS: I certify that this patient is under my care and that I had a face-to-face encounter that meets the physician face-to-face encounter requirements with this patient on this date. The encounter with the patient was in whole or in part for the following MEDICAL CONDITION: (primary reason for Home Healthcare) MEDICAL NECESSITY: I certify, that based on my findings, NURSING  services are a medically necessary home health service. HOME BOUND STATUS: I certify that my clinical findings support that this patient is homebound (i.e., Due to illness or injury, pt requires aid of supportive devices such as crutches, cane, wheelchairs, walkers, the use of special transportation or the assistance of another person to leave their place of residence. There is a normal inability to leave the home and doing so requires considerable and taxing effort. Other absences are for medical reasons / religious services and are infrequent or of short duration when for other reasons). o If current dressing causes regression in wound condition, may D/C ordered dressing product/s and apply Normal Saline Moist Dressing daily until next Wound Healing Center / Other MD appointment. Notify Wound Healing Center of regression in wound condition at [phone]. o Please direct any NON-WOUND related issues/requests for orders to patient's Primary Care Physician Wound #6R Left,Plantar Foot o Continue Home Health Visits o Home Health Nurse may visit PRN to address patientos wound care needs. o FACE TO FACE ENCOUNTER: MEDICARE and MEDICAID PATIENTS: I certify that this patient is under my care and that I had a face-to-face encounter that meets the physician  face-to-face encounter requirements with this patient on this date. The encounter with the patient was in whole or in part for the following MEDICAL CONDITION: (primary reason for Home Healthcare) MEDICAL NECESSITY: I certify, that based on my findings, NURSING services are a medically necessary home health service. HOME BOUND STATUS: I certify that my clinical findings support that this patient is homebound (i.e., Due to illness or injury, pt requires aid of supportive devices such as crutches, cane, wheelchairs, walkers, the use of special transportation or the assistance of another person to leave their place of residence. There is a normal inability to leave the home and doing so requires considerable and taxing effort. Other absences are for medical reasons / religious services and are infrequent or of short duration when for other reasons). o If current dressing causes regression in wound condition, may D/C ordered dressing product/s and apply Normal Saline Moist Dressing daily until next Wound Healing Center / Other MD appointment. Notify Wound Healing Center of regression in wound condition at [phone]. o Please direct any NON-WOUND related issues/requests for orders to patient's Primary Care Physician Wound #7 Left Metatarsal head first o Continue Home Health Visits o Home Health Nurse may visit PRN to address patientos wound care needs. o FACE TO FACE ENCOUNTER: MEDICARE and MEDICAID PATIENTS: I certify that this patient is under my care and that I had a face-to-face encounter that meets the physician face-to-face encounter requirements with this patient on this date. The encounter with the patient was in whole or in part for the following MEDICAL CONDITION: (primary reason for Home Healthcare) KELLIE, CHISOLM ([id number]) MEDICAL NECESSITY: I certify, that based on my findings, NURSING services are a medically necessary home health service. HOME BOUND STATUS: I  certify that my clinical findings support that this patient is homebound (i.e., Due to illness or injury, pt requires aid of supportive devices such as crutches, cane, wheelchairs, walkers, the use of special transportation or the assistance of another person to leave their place of residence. There is a normal inability to leave the home and doing so requires considerable and taxing effort. Other absences are for medical reasons / religious services and are infrequent or of short duration when for other reasons). o If current dressing causes regression in wound condition, may D/C ordered dressing product/s and  apply Normal Saline Moist Dressing daily until next [facility] / Other MD appointment. [facility] of regression in wound condition at [phone]. o Please direct any NON-WOUND related issues/requests for orders to patient's Primary Care Physician Consults o Radiology - ultrasound of left leg/knee to rule out DVT. Electronic Signature(s) Signed: 10/05/2015 5:32:09 PM By: Regan Lemming BSN, RN Signed: 10/06/2015 1:24:33 PM By: [name] MD Entered By: Regan Lemming on 10/05/2015 13:32:49 [name] ([id number]) -------------------------------------------------------------------------------- Problem List Details Patient Name: Chris Bottoms K. Date of Service: 10/05/2015 12:45 PM Medical Record Patient Account Number: [id number] Number: Treating RN: Baruch Gouty, RN, BSN, Rita 1924-12-16 [phone] y.o. Other Clinician: Date of Birth/Sex: Male) Treating ROBSON, MICHAEL Primary Care Physician: Golden Pop Physician/Extender: G Referring Physician: Golden Pop Weeks in Treatment: 11 Active Problems ICD-10 Encounter Code Description Active Date Diagnosis L97.522 Non-pressure chronic ulcer of other part of left foot with fat 07/20/2015 Yes layer exposed L97.511 Non-pressure chronic ulcer of other part of right foot 07/20/2015 Yes limited to  breakdown of skin E11.621 Type 2 diabetes mellitus with foot ulcer 07/20/2015 Yes E11.51 Type 2 diabetes mellitus with diabetic peripheral 07/20/2015 Yes angiopathy without gangrene Inactive Problems Resolved Problems Electronic Signature(s) Signed: 10/06/2015 1:24:33 PM By: [name] MD Entered By: [name] on 10/05/2015 13:43:09 Voris, [name] ([id number]) -------------------------------------------------------------------------------- Progress Note Details Patient Name: Chris Bottoms K. Date of Service: 10/05/2015 12:45 PM Medical Record Patient Account Number: [id number] Number: Treating RN: Baruch Gouty, RN, BSN, Rita July 21, 1924 [phone] y.o. Other Clinician: Date of Birth/Sex: Male) Treating ROBSON, MICHAEL Primary Care Physician: Golden Pop Physician/Extender: G Referring Physician: Golden Pop Weeks in Treatment: 11 Subjective Chief Complaint Information obtained from Patient Has had swelling of both lower extremities with some ulceration on the right lower extremity. 07/20/15 patient returns today predominantly for a painful wound on the plantar aspect of his left foot of recent onset. History of Present Illness (HPI) The following HPI elements were documented for the patient's wound: Location: had swelling of the right lower extremity with some ulceration on the medial part of his calf and on the right fifth toe Quality: Patient reports No Pain. Severity: Patient states wound (s) are getting better. Duration: Patient has had the wound for < 2 weeks prior to presenting for treatment Context: The wound appeared gradually over time. nail on his right fifth toe came off loose and he pried it Modifying Factors: Other treatment(s) tried include:vascular workup where he is going to be seeing Dr. Lucky Cowboy on Monday Associated Signs and Symptoms: Patient reports presence of swelling Very pleasant 82 year old with a past medical history significant for type 2 diabetes.  He underwent left knee surgery in February 2015. He had an angioplasty of his R posterior tibial on 10/30/13. Vascular workup done in April of this year showed an ABI on the right to be noncompressible and the left to be 0.86. About a week ago he noticed some swelling on the right lower extremity and then an ulceration on the medial part of his calf. He also noted that the right fifth nail bed had a loose nail and he pried it open and now has an open wound. He has no evidence of cellulitis. he was seen by the nurse practitioner at his PCPs office and put on Bactroban ointment and doxycycline for 10 days. 01/21/2015 -- he was seen by Dr. Ronalee Belts and though we do not have the note, we know he had used an Unna's boot and asked him to  come to see as again for a compression bandage. The patient has no fresh complaints. 07/20/15; the patient returns today predominantly for her wounds on his left plantar foot. He was seen here in the summer last on 8/19 for wounds on his right medial calf and right fifth nail bed. I don't know that he actually returned to be discharged, he is wearing compression on bilateral legs. He has known peripheral arterial disease in the setting of type 2 diabetes having an angioplasty on the right posterior tibial artery on 10/30/13. His ABI calculated in the clinic today was 0.97 on the right. I don't think the left could be done. The patient tells me that roughly a week ago he started to develop pain in the plantar aspect of his left foot. I [name], [name] ([id number]) think his caregiver noticed the wounds and he is here for our review events. He is minimally ambulatory, spends most of his time in a wheelchair. I am not certain of the issue here. 07/27/15; the patient is here for 3 wounds. The first over his left first met head, second over the left medial heel and the third over the left fourth metatarsal head. Culture from the fourth metatarsal head last week showed  Klebsiella. This would not be sensitive to the doxycycline possibly I have therefore changed him to Keflex to which the Klebsiella should be sensitive 08/10/15; patient returns today. He was not seen last week. He does have home health. The area over the left fourth metatarsal head is almost completely closed the area over the medial aspect of his left first metatarsal head and the left medial heel still required debridement 08/17/15 the area over the fourth metatarsal head has resolved. The area on his left first metatarsal head lateral aspect and the left medial heel still require surgical debridement although the surface is beginning to look a bit better. Is followed with vascular surgery and is apparently being planned for an angiogram and possible stent on 3/28 08/23/15 the patient complained of pain over his fourth metatarsal head, the previously closed wound. Physical exam did not really show a source he had a small amount of callus which I lightly removed to expose a large amount of purulent drainage. The area was then fully debridement the area on the first metatarsal head lateral aspect and left medial heel appear improved [Iodosorb] 08/31/15; the patient is completed a well-tolerated angioplasty of the left posterior tibial artery. The left common femoral profunda femoris and superficial femoral arteries are diffusely diseased but there was no hemodynamically significant lesion. Culture I did last week from the left fourth metatarsal head enterococcus I'm going to give him another week of Keflex which should cover this. Patient states he had some pain but it seems to have resolved in the sole of his foot 09/07/15 he has completed his Keflex yesterday. 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 Using Iodoflex. He is completed antibiotics. 09/21/15; MRI next week, we will see him in  2 weeks. 10/05/15; miraculously this patient's MRI did not show osteomyelitis in his left foot.in [place] of the very deep wound on the lateral aspect of his first metatarsal phalangeal, and a very deep reoccurring wound over the plantar aspect of his left fourth metatarsal head there is no osteomyelitis!!! He did call in yesterday to report marked swelling in the left  leg with some tenderness in his knee. He apparently saw his primary physician who gave him antibiotics for a UTI although I'm not exactly sure what these antibiotics were. He has been using it Iodoflex Objective Constitutional Sitting or standing Blood Pressure is within target range for patient.. Pulse regular and within target range for patient.Marland Kitchen Respirations regular, non-labored and within target range.. Temperature is normal and within the target range for the patient.. Patient's appearance is neat and clean. Appears in no acute distress. Well nourished and well developed.. Vitals Time Taken: 1:01 PM, Height: 72 in, Weight: 195 lbs, BMI: 26.4, Temperature: 97.8 F, Pulse: 73 bpm, Respiratory Rate: 17 breaths/min, Blood Pressure: 118/56 mmHg. [name], [name] ([id number]) Cardiovascular Edema present in both extremities. There is however swelling tenderness in the calf on the left leg give. There is enough here to make a DVT rule out necessary. Musculoskeletal Left knee is warm but without an effusion. I suspect this is probably arthritis of some type probably osteo-he has a flexion contracture of this knee as well. General Notes: Wound exam; the patient has a deep wound on the lateral aspect of the left first metatarsal head. Aggressive debridement done, now that I know there is no underlying osteomyelitis I will try to treat this very aggressively. Also want aggressive debridement done of the recurrent wound on the fourth met head on the plantar aspect. The area on his lateral heel is also debridement but appears to be less  ominous Integumentary (Hair, Skin) Wound #5 status is Open. Original cause of wound was Blister. The wound is located on the Left Calcaneus. The wound measures 0.5cm length x 0.5cm width x 0.2cm depth; 0.196cm^2 area and 0.039cm^3 volume. The wound is limited to skin breakdown. There is no tunneling or undermining noted. There is a large amount of serosanguineous drainage noted. The wound margin is distinct with the outline attached to the wound base. There is small (1-33%) red, pink granulation within the wound bed. There is a medium (34-66%) amount of necrotic tissue within the wound bed including Adherent Slough. The periwound skin appearance exhibited: Localized Edema, Dry/Scaly, Moist. The periwound skin appearance did not exhibit: Callus, Crepitus, Excoriation, Fluctuance, Friable, Induration, Rash, Scarring, Maceration, Atrophie Blanche, Cyanosis, Ecchymosis, Hemosiderin Staining, Mottled, Pallor, Rubor, Erythema. Periwound temperature was noted as No Abnormality. The periwound has tenderness on palpation. Wound #6R status is Open. Original cause of wound was Gradually Appeared. The wound is located on the [place]. The wound measures 1cm length x 1cm width x 0.4cm depth; 0.785cm^2 area and 0.314cm^3 volume. The wound is limited to skin breakdown. There is no tunneling or undermining noted. There is a large amount of serosanguineous drainage noted. The wound margin is distinct with the outline attached to the wound base. There is medium (34-66%) pink granulation within the wound bed. There is a small (1-33%) amount of necrotic tissue within the wound bed including Adherent Slough. The periwound skin appearance exhibited: Callus, Localized Edema, Moist. The periwound skin appearance did not exhibit: Crepitus, Excoriation, Fluctuance, Friable, Induration, Rash, Scarring, Dry/Scaly, Maceration, Atrophie Blanche, Cyanosis, Ecchymosis, Hemosiderin Staining, Mottled, Pallor, Rubor,  Erythema. Periwound temperature was noted as No Abnormality. The periwound has tenderness on palpation. Wound #7 status is Open. Original cause of wound was Gradually Appeared. The wound is located on the Left Metatarsal head first. The wound measures 3cm length x 3.4cm width x 0.2cm depth; 8.011cm^2 area and 1.602cm^3 volume. The wound is limited to skin breakdown. There is no tunneling or  undermining noted. There is a large amount of serosanguineous drainage noted. The wound margin is distinct with the outline attached to the wound base. There is small (1-33%) pink granulation within the wound bed. There is a large (67-100%) amount of necrotic tissue within the wound bed including Adherent Slough. The periwound skin appearance exhibited: Callus, Localized Edema, Maceration, Moist. The periwound skin appearance did not exhibit: Crepitus, Excoriation, Fluctuance, Friable, Induration, Rash, Scarring, Dry/Scaly, Atrophie Blanche, Cyanosis, Ecchymosis, Hemosiderin Staining, Mottled, Pallor, Rubor, Erythema. Periwound temperature was noted as No Abnormality. The periwound has tenderness on palpation. YSMAEL, HIRES ([id number]) Assessment Active Problems ICD-10 [id number] - Non-pressure chronic ulcer of other part of left foot with fat layer exposed L97.511 - Non-pressure chronic ulcer of other part of right foot limited to breakdown of skin E11.621 - Type 2 diabetes mellitus with foot ulcer E11.51 - Type 2 diabetes mellitus with diabetic peripheral angiopathy without gangrene Procedures Wound #5 Wound #5 is a Diabetic Wound/Ulcer of the Lower Extremity located on the Left Calcaneus . There was a Skin/Subcutaneous Tissue Debridement (02245-52463) debridement with total area of 0.25 sq cm performed by Maxwell Caul, MD. with the following instrument(s): Curette including Exudate, Fibrin/Slough, and Subcutaneous after achieving pain control using Lidocaine 4% Topical Solution. A time out was  conducted prior to the start of the procedure. A Minimum amount of bleeding was controlled with Pressure. The procedure was tolerated well with a pain level of 0 throughout and a pain level of 0 following the procedure. Post Debridement Measurements: 0.5cm length x 0.5cm width x 0.2cm depth; 0.039cm^3 volume. Post procedure Diagnosis Wound #5: Same as Pre-Procedure Wound #6R Wound #6R is a Diabetic Wound/Ulcer of the Lower Extremity located on the Left,Plantar Foot . There was a Skin/Subcutaneous Tissue Debridement ([id number]) debridement with total area of 1 sq cm performed by Maxwell Caul, MD. with the following instrument(s): Curette to remove Non-Viable tissue/material including Exudate, Fibrin/Slough, and Subcutaneous after achieving pain control using Lidocaine 4% Topical Solution. A time out was conducted prior to the start of the procedure. A Minimum amount of bleeding was controlled with Pressure. The procedure was tolerated well with a pain level of 0 throughout and a pain level of 0 following the procedure. Post Debridement Measurements: 1cm length x 1cm width x 0.4cm depth; 0.314cm^3 volume. Post procedure Diagnosis Wound #6R: Same as Pre-Procedure Wound #7 Wound #7 is a Diabetic Wound/Ulcer of the Lower Extremity located on the Left Metatarsal head first . There was a Skin/Subcutaneous Tissue Debridement ([id number]) debridement with total area of 10.2 sq cm performed by Maxwell Caul, MD. with the following instrument(s): Curette including Exudate, Fibrin/Slough, and Subcutaneous after achieving pain control using Lidocaine 4% Topical Solution. A time out was conducted prior to the start of the procedure. A Minimum amount of bleeding was controlled with Pressure. The procedure was tolerated well with a pain level of 0 throughout and a pain level of 0 following [name], [name] ([id number]) the procedure. Post Debridement Measurements: 3cm length x 3.4cm width x 0.2cm  depth; 1.602cm^3 volume. Post procedure Diagnosis Wound #7: Same as Pre-Procedure Plan Wound Cleansing: Wound #5 Left Calcaneus: Cleanse wound with mild soap and water Wound #6R Left,Plantar Foot: Cleanse wound with mild soap and water Wound #7 Left Metatarsal head first: Cleanse wound with mild soap and water Anesthetic: Wound #5 Left Calcaneus: Topical Lidocaine 4% cream applied to wound bed prior to debridement Wound #6R Left,Plantar Foot: Topical Lidocaine 4% cream applied to wound bed  prior to debridement Wound #7 Left Metatarsal head first: Topical Lidocaine 4% cream applied to wound bed prior to debridement Primary Wound Dressing: Wound #5 Left Calcaneus: Iodoflex Wound #6R Left,Plantar Foot: Iodoflex Wound #7 Left Metatarsal head first: Iodoflex Secondary Dressing: Wound #5 Left Calcaneus: Gauze and Kerlix/Conform Wound #6R Left,Plantar Foot: Gauze and Kerlix/Conform Wound #7 Left Metatarsal head first: Gauze and Kerlix/Conform Dressing Change Frequency: Wound #5 Left Calcaneus: Change dressing every day. Wound #6R Left,Plantar Foot: Change dressing every day. Wound #7 Left Metatarsal head first: Change dressing every day. Follow-up Appointments: Wound #5 Left Calcaneus: Return Appointment in 2 weeks. Wound #6R Left,Plantar Foot: [name], [name] ([id number]) Return Appointment in 2 weeks. Wound #7 Left Metatarsal head first: Return Appointment in 2 weeks. Off-Loading: Wound #5 Left Calcaneus: Turn and reposition every 2 hours Wound #6R Left,Plantar Foot: Turn and reposition every 2 hours Wound #7 Left Metatarsal head first: Turn and reposition every 2 hours Additional Orders / Instructions: Wound #5 Left Calcaneus: Increase protein intake. Activity as tolerated Wound #6R Left,Plantar Foot: Increase protein intake. Activity as tolerated Wound #7 Left Metatarsal head first: Increase protein intake. Activity as tolerated Home Health: Wound #5  Left Calcaneus: Continue Home Health Visits - [place] Nurse may visit PRN to address patient s wound care needs. FACE TO FACE ENCOUNTER: MEDICARE and MEDICAID PATIENTS: I certify that this patient is under my care and that I had a face-to-face encounter that meets the physician face-to-face encounter requirements with this patient on this date. The encounter with the patient was in whole or in part for the following MEDICAL CONDITION: (primary reason for [place]) MEDICAL NECESSITY: I certify, that based on my findings, NURSING services are a medically necessary home health service. HOME BOUND STATUS: I certify that my clinical findings support that this patient is homebound (i.e., Due to illness or injury, pt requires aid of supportive devices such as crutches, cane, wheelchairs, walkers, the use of special transportation or the assistance of another person to leave their place of residence. There is a normal inability to leave the home and doing so requires considerable and taxing effort. Other absences are for medical reasons / religious services and are infrequent or of short duration when for other reasons). If current dressing causes regression in wound condition, may D/C ordered dressing product/s and apply Normal Saline Moist Dressing daily until next [place] / Other MD appointment. [place] of regression in wound condition at [phone]. Please direct any NON-WOUND related issues/requests for orders to patient's Primary Care Physician Wound #6R Left,Plantar Foot: [place] Nurse may visit PRN to address patient s wound care needs. FACE TO FACE ENCOUNTER: MEDICARE and MEDICAID PATIENTS: I certify that this patient is under my care and that I had a face-to-face encounter that meets the physician face-to-face encounter requirements with this patient on this date. The encounter with the patient was in whole  or in part for the following MEDICAL CONDITION: (primary reason for [place]) MEDICAL NECESSITY: I certify, that based on my findings, NURSING services are a medically necessary home health service. HOME BOUND STATUS: I certify that my clinical findings support that this patient is homebound (i.e., Due to illness or injury, pt requires aid of supportive devices such as crutches, cane, wheelchairs, walkers, the use of special transportation or the assistance of another person to leave their place of residence. There is a normal inability to leave the home and doing so  requires considerable and taxing effort. Other absences are for medical reasons / religious services and are infrequent or of short duration when for other reasons). VICKY, MCCANLESS. ([id number]) If current dressing causes regression in wound condition, may D/C ordered dressing product/s and apply Normal Saline Moist Dressing daily until next Wound Healing Center / Other MD appointment. Notify Wound Healing Center of regression in wound condition at [phone]. Please direct any NON-WOUND related issues/requests for orders to patient's Primary Care Physician Wound #7 Left Metatarsal head first: Continue Home Health Visits Home Health Nurse may visit PRN to address patient s wound care needs. FACE TO FACE ENCOUNTER: MEDICARE and MEDICAID PATIENTS: I certify that this patient is under my care and that I had a face-to-face encounter that meets the physician face-to-face encounter requirements with this patient on this date. The encounter with the patient was in whole or in part for the following MEDICAL CONDITION: (primary reason for Home Healthcare) MEDICAL NECESSITY: I certify, that based on my findings, NURSING services are a medically necessary home health service. HOME BOUND STATUS: I certify that my clinical findings support that this patient is homebound (i.e., Due to illness or injury, pt requires aid of supportive  devices such as crutches, cane, wheelchairs, walkers, the use of special transportation or the assistance of another person to leave their place of residence. There is a normal inability to leave the home and doing so requires considerable and taxing effort. Other absences are for medical reasons / religious services and are infrequent or of short duration when for other reasons). If current dressing causes regression in wound condition, may D/C ordered dressing product/s and apply Normal Saline Moist Dressing daily until next Wound Healing Center / Other MD appointment. Notify Wound Healing Center of regression in wound condition at [phone]. Please direct any NON-WOUND related issues/requests for orders to patient's Primary Care Physician Consults ordered were: Radiology - ultrasound of left leg/knee to rule out DVT. #1 we will continue with Iodoflex to all 3 wounds #2 he is nonambulatory so pressure on the bottom his foot should really not be an issue. #3 I didn't see anything that was worth culturing here and did not adjust his antibiotics. #4 I am concerned about the edema and tenderness in the left leg, I see nothing that looks like cellulitis he needs a DVT rule out here Electronic Signature(s) Signed: 10/06/2015 1:24:33 PM By: Baltazar Najjar MD Entered By: Baltazar Najjar on 10/05/2015 13:53:53 Skyles, Dorena [name] ([id number]) -------------------------------------------------------------------------------- SuperBill Details Patient Name: Chris Riding K. Date of Service: 10/05/2015 Medical Record Patient Account Number: [id number] Number: Treating RN: Clover Mealy, RN, BSN, Rita June 29, 1924 [phone] y.o. Other Clinician: Date of Birth/Sex: Male) Treating ROBSON, MICHAEL Primary Care Physician: Vonita Moss Physician/Extender: G Referring Physician: Vonita Moss Weeks in Treatment: 11 Diagnosis Coding ICD-10 Codes Code Description [phone] Non-pressure chronic ulcer of other  part of left foot with fat layer exposed L97.511 Non-pressure chronic ulcer of other part of right foot limited to breakdown of skin E11.621 Type 2 diabetes mellitus with foot ulcer E11.51 Type 2 diabetes mellitus with diabetic peripheral angiopathy without gangrene Facility Procedures CPT4 Code: [id number] Description: 11042 - DEB SUBQ TISSUE 20 SQ CM/< ICD-10 Description Diagnosis E11.621 Type 2 diabetes mellitus with foot ulcer Modifier: Quantity: 1 Physician Procedures CPT4 Code: [id number] Description: 11042 - WC PHYS SUBQ TISS 20 SQ CM ICD-10 Description Diagnosis E11.621 Type 2 diabetes mellitus with foot ulcer Modifier: Quantity: 1 Electronic Signature(s) Signed: 10/06/2015 1:24:33 PM By: Baltazar Najjar  MD Entered By: [name] on 10/05/2015 13:54:29

## 2015-10-07 ENCOUNTER — Other Ambulatory Visit: Payer: Self-pay | Admitting: Cardiovascular Disease

## 2015-10-10 ENCOUNTER — Telehealth: Payer: Self-pay | Admitting: Family Medicine

## 2015-10-10 NOTE — Telephone Encounter (Signed)
Called, verbal ok given

## 2015-10-10 NOTE — Telephone Encounter (Signed)
Expand All Collapse All   Ester called and would like to request 4 more visits for OT to complete plan of care       ok

## 2015-10-10 NOTE — Telephone Encounter (Signed)
Ester called and would like to request 4 more visits for OT to complete plan of care

## 2015-10-11 ENCOUNTER — Ambulatory Visit: Payer: PPO | Admitting: Internal Medicine

## 2015-10-15 ENCOUNTER — Other Ambulatory Visit: Payer: Self-pay | Admitting: Cardiovascular Disease

## 2015-10-17 ENCOUNTER — Telehealth: Payer: Self-pay

## 2015-10-17 MED ORDER — TAMSULOSIN HCL 0.4 MG PO CAPS: 0.4000 mg | ORAL_CAPSULE | Freq: Every day | ORAL | Status: AC

## 2015-10-17 NOTE — Telephone Encounter (Signed)
[facility] for   Tamsulosin 0.4mg  Cap

## 2015-10-19 ENCOUNTER — Encounter: Payer: PPO | Admitting: Internal Medicine

## 2015-10-19 DIAGNOSIS — L97522 Non-pressure chronic ulcer of other part of left foot with fat layer exposed: Secondary | ICD-10-CM | POA: Diagnosis not present

## 2015-10-20 NOTE — Progress Notes (Signed)
[name], Creek K. ([id number]) Visit Report for 10/19/2015 Arrival Information Details Patient Name: [name], [name] Date of Service: 10/19/2015 10:45 AM Medical Record Number: [id number] Patient Account Number: [id number] Date of Birth/Sex: Jan 12, 1925 [age] y.o. Male) Treating RN: Phillis HaggisPinkerton, Debi Primary Care Physician: Vonita MossRISSMAN, MARK Other Clinician: Referring Physician: Vonita MossRISSMAN, MARK Treating Physician/Extender: Altamese CarolinaOBSON, MICHAEL G Weeks in Treatment: 13 Visit Information History Since Last Visit All ordered tests and consults were completed: No Patient Arrived: Wheel Chair Added or deleted any medications: No Arrival Time: 10:47 Any new allergies or adverse reactions: No Accompanied By: son Had a fall or experienced change in No activities of daily living that may affect Transfer Assistance: Other risk of falls: Patient Identification Verified: Yes Signs or symptoms of abuse/neglect since last No Secondary Verification Process Yes visito Completed: Hospitalized since last visit: No Patient Requires Transmission-Based No Pain Present Now: No Precautions: Patient Has Alerts: No Electronic Signature(s) Signed: 10/19/2015 5:19:35 PM By: Alejandro MullingPinkerton, Debra Entered By: Alejandro MullingPinkerton, Debra on 10/19/2015 10:48:07 Donica, Dorena DewELMER K. ([id number]) -------------------------------------------------------------------------------- Encounter Discharge Information Details Patient Name: [name], [name] Date of Service: 10/19/2015 10:45 AM Medical Record Number: [id number] Patient Account Number: [id number] Date of Birth/Sex: Jan 12, 1925 [age] y.o. Male) Treating RN: Phillis HaggisPinkerton, Debi Primary Care Physician: Vonita MossRISSMAN, MARK Other Clinician: Referring Physician: Vonita MossRISSMAN, MARK Treating Physician/Extender: Altamese CarolinaOBSON, MICHAEL G Weeks in Treatment: 13 Encounter Discharge Information Items Discharge Pain Level: 0 Discharge Condition: Stable Ambulatory Status: Wheelchair Discharge Destination:  Home Transportation: Private Auto Accompanied By: son Schedule Follow-up Appointment: Yes Medication Reconciliation completed and provided to Patient/Care Yes Cheikh Bramble: Provided on Clinical Summary of Care: 10/19/2015 Form Type Recipient Paper Patient [facility] Electronic Signature(s) Signed: 10/19/2015 5:19:35 PM By: Alejandro MullingPinkerton, Debra Previous Signature: 10/19/2015 11:44:54 AM Version By: Gwenlyn PerkingMoore, Shelia Entered By: Alejandro MullingPinkerton, Debra on 10/19/2015 11:48:56 Laforge, Dorena DewELMER K. ([id number]) -------------------------------------------------------------------------------- Lower Extremity Assessment Details Patient Name: [name], [name] Date of Service: 10/19/2015 10:45 AM Medical Record Number: [id number] Patient Account Number: [id number] Date of Birth/Sex: Jan 12, 1925 [age] y.o. Male) Treating RN: Phillis HaggisPinkerton, Debi Primary Care Physician: Vonita MossRISSMAN, MARK Other Clinician: Referring Physician: Vonita MossRISSMAN, MARK Treating Physician/Extender: Altamese CarolinaOBSON, MICHAEL G Weeks in Treatment: 13 Vascular Assessment Pulses: Posterior Tibial Dorsalis Pedis Palpable: [Left:No] Doppler: [Left:Monophasic] Extremity colors, hair growth, and conditions: Extremity Color: [Left:Mottled] Temperature of Extremity: [Left:Warm] Capillary Refill: [Left:< 3 seconds] Toe Nail Assessment Left: Right: Thick: Yes Discolored: Yes Deformed: No Improper Length and Hygiene: No Electronic Signature(s) Signed: 10/19/2015 5:19:35 PM By: Alejandro MullingPinkerton, Debra Entered By: Alejandro MullingPinkerton, Debra on 10/19/2015 10:52:13 Sondgeroth, Dorena DewELMER K. ([id number]) -------------------------------------------------------------------------------- Multi Wound Chart Details Patient Name: [name], [name] Date of Service: 10/19/2015 10:45 AM Medical Record Number: [id number] Patient Account Number: [id number] Date of Birth/Sex: Jan 12, 1925 [age] y.o. Male) Treating RN: Phillis HaggisPinkerton, Debi Primary Care Physician: Vonita MossRISSMAN, MARK Other Clinician: Referring Physician: Vonita MossRISSMAN,  MARK Treating Physician/Extender: Maxwell CaulOBSON, MICHAEL G Weeks in Treatment: 13 Vital Signs Height(in): 72 Pulse(bpm): 72 Weight(lbs): 195 Blood Pressure 147/59 (mmHg): Body Mass Index(BMI): 26 Temperature(F): 97.6 Respiratory Rate 18 (breaths/min): Photos: [5:No Photos] [6R:No Photos] [7:No Photos] Wound Location: [5:Left Calcaneus] [6R:Left Foot - Plantar] [7:Left Metatarsal head first] Wounding Event: [5:Blister] [6R:Gradually Appeared] [7:Gradually Appeared] 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] [6R:the Lower Extremity] [7:the Lower Extremity] Comorbid History: [5:Hypertension, Type II Diabetes, History of pressure wounds, Osteoarthritis, Neuropathy Osteoarthritis, Neuropathy Osteoarthritis, Neuropathy] [6R:Hypertension, Type II Diabetes, History of pressure wounds,] [7:Hypertension, Type II  Diabetes, History of pressure wounds,] Date Acquired: [5:07/11/2015] [6R:07/12/2015] [7:07/11/2015] Weeks of Treatment: [5:13] [6R:13] [7:13] Wound Status: [5:Open] [  6R:Open] [7:Open] Wound Recurrence: [5:No] [6R:Yes] [7:No] Measurements L x W x D 0.8x0.8x0.2 [6R:1x1.5x0.3] [7:3x3.2x0.2] (cm) Area (cm) : [5:0.503] [6R:1.178] [7:7.54] Volume (cm) : [5:0.101] [6R:0.353] [7:1.508] % Reduction in Area: [5:94.20%] [6R:62.50%] [7:-540.10%] % Reduction in Volume: 88.30% [6R:-12.40%] [7:-1060.00%] Classification: [5:Grade 1] [6R:Grade 1] [7:Grade 1] Exudate Amount: [5:Large] [6R:Large] [7:Large] Exudate Type: [5:Serosanguineous] [6R:Serous] [7:Serosanguineous] Exudate Color: [5:red, brown] [6R:amber] [7:red, brown] Wound Margin: [5:Distinct, outline attached Distinct, outline attached Distinct, outline attached] Granulation Amount: [5:Small (1-33%)] [6R:Small (1-33%)] [7:Small (1-33%)] Granulation Quality: [5:Red, Pink] [6R:Pink] [7:Pink] Necrotic Amount: [5:Medium (34-66%)] [6R:Large (67-100%)] [7:Large (67-100%)] Exposed  Structures: [5:Fascia: No Fat: No Tendon: No] [6R:Fascia: No Fat: No Tendon: No] [7:Fascia: No Fat: No Tendon: No] Muscle: No Muscle: No Muscle: No Joint: No Joint: No Joint: No Bone: No Bone: No Bone: No Limited to Skin Limited to Skin Limited to Skin Breakdown Breakdown Breakdown Epithelialization: Small (1-33%) Medium (34-66%) Small (1-33%) Periwound Skin Texture: Edema: Yes Edema: Yes Edema: Yes Excoriation: No Callus: Yes Callus: Yes Induration: No Excoriation: No Excoriation: No Callus: No Induration: No Induration: No Crepitus: No Crepitus: No Crepitus: No Fluctuance: No Fluctuance: No Fluctuance: No Friable: No Friable: No Friable: No Rash: No Rash: No Rash: No Scarring: No Scarring: No Scarring: No Periwound Skin Moist: Yes Moist: Yes Maceration: Yes Moisture: Dry/Scaly: Yes Maceration: No Moist: Yes Maceration: No Dry/Scaly: No Dry/Scaly: No Periwound Skin Color: Atrophie Blanche: No Atrophie Blanche: No Atrophie Blanche: No Cyanosis: No Cyanosis: No Cyanosis: No Ecchymosis: No Ecchymosis: No Ecchymosis: No Erythema: No Erythema: No Erythema: No Hemosiderin Staining: No Hemosiderin Staining: No Hemosiderin Staining: No Mottled: No Mottled: No Mottled: No Pallor: No Pallor: No Pallor: No Rubor: No Rubor: No Rubor: No Temperature: No Abnormality No Abnormality No Abnormality Tenderness on Yes Yes Yes Palpation: Wound Preparation: Ulcer Cleansing: Ulcer Cleansing: Ulcer Cleansing: Rinsed/Irrigated with Rinsed/Irrigated with Rinsed/Irrigated with Saline Saline Saline Topical Anesthetic Topical Anesthetic Topical Anesthetic Applied: Other: lidocaine Applied: Other: lidocaine Applied: Other: lidocaine 4% 4% 4% Treatment Notes Electronic Signature(s) Signed: 10/19/2015 5:19:35 PM By: Alejandro Mulling Entered By: Alejandro Mulling on 10/19/2015 11:03:03 Chris Russel  ([id number]) -------------------------------------------------------------------------------- Multi-Disciplinary Care Plan Details Patient Name: Chris Riding K. Date of Service: 10/19/2015 10:45 AM Medical Record Number: [id number] Patient Account Number: [id number] Date of Birth/Sex: November 16, 1924 (82 y.o. Male) Treating RN: Phillis Haggis Primary Care Physician: Vonita Moss Other Clinician: Referring Physician: Vonita Moss Treating Physician/Extender: Altamese [place] in Treatment: 13 Active Inactive Abuse / Safety / Falls / Self Care Management Nursing Diagnoses: Impaired home maintenance Impaired physical mobility Knowledge deficit related to: safety; personal, health (wound), emergency Potential for falls Self care deficit: actual or potential Goals: Patient will remain injury free Date Initiated: 07/20/2015 Goal Status: Active Patient/caregiver will verbalize understanding of skin care regimen Date Initiated: 07/20/2015 Goal Status: Active Patient/caregiver will verbalize/demonstrate measure taken to improve self care Date Initiated: 07/20/2015 Goal Status: Active Patient/caregiver will verbalize/demonstrate measures taken to improve the patient's personal safety Date Initiated: 07/20/2015 Goal Status: Active Patient/caregiver will verbalize/demonstrate measures taken to prevent injury and/or falls Date Initiated: 07/20/2015 Goal Status: Active Patient/caregiver will verbalize/demonstrate understanding of what to do in case of emergency Date Initiated: 07/20/2015 Goal Status: Active Interventions: Assess fall risk on admission and as needed Assess: immobility, friction, shearing, incontinence upon admission and as needed Assess impairment of mobility on admission and as needed per policy Assess self care needs on admission and as needed Patient referred to community resources (specify in notes) SEABORN, NAKAMA K. ([id number]) Provide  education on basic  hygiene Provide education on fall prevention Provide education on personal and home safety Provide education on safe transfers Treatment Activities: Education provided on Basic Hygiene : 10/05/2015 Notes: Orientation to the Wound Care Program Nursing Diagnoses: Knowledge deficit related to the wound healing center program Goals: Patient/caregiver will verbalize understanding of the Wound Healing Center Program Date Initiated: 07/20/2015 Goal Status: Active Interventions: Provide education on orientation to the wound center Notes: Wound/Skin Impairment Nursing Diagnoses: Impaired tissue integrity Knowledge deficit related to ulceration/compromised skin integrity Goals: Patient/caregiver will verbalize understanding of skin care regimen Date Initiated: 07/20/2015 Goal Status: Active Ulcer/skin breakdown will have a volume reduction of 50% by week 8 Date Initiated: 07/20/2015 Goal Status: Active Ulcer/skin breakdown will have a volume reduction of 80% by week 12 Date Initiated: 07/20/2015 Goal Status: Active Ulcer/skin breakdown will heal within 14 weeks Date Initiated: 07/20/2015 Goal Status: Active Interventions: Assess patient/caregiver ability to obtain necessary supplies Steele, Rylen K. ([id number]) Assess patient/caregiver ability to perform ulcer/skin care regimen upon admission and as needed Assess ulceration(s) every visit Provide education on ulcer and skin care Treatment Activities: Patient referred to home care : 09/21/2015 Skin care regimen initiated : 09/21/2015 Topical wound management initiated : 09/21/2015 Notes: Electronic Signature(s) Signed: 10/19/2015 5:19:35 PM By: Alejandro Mulling Entered By: Alejandro Mulling on 10/19/2015 11:02:44 Browder, [name] ([id number]) -------------------------------------------------------------------------------- Pain Assessment Details Patient Name: Chris Riding K. Date of Service: 10/19/2015 10:45 AM Medical Record Number:  [id number] Patient Account Number: [id number] Date of Birth/Sex: Aug 14, 1924 (82 y.o. Male) Treating RN: Phillis Haggis Primary Care Physician: Vonita Moss Other Clinician: Referring Physician: Vonita Moss Treating Physician/Extender: Maxwell Caul Weeks in Treatment: 13 Active Problems Location of Pain Severity and Description of Pain Patient Has Paino No Site Locations Pain Management and Medication Current Pain Management: Electronic Signature(s) Signed: 10/19/2015 5:19:35 PM By: Alejandro Mulling Entered By: Alejandro Mulling on 10/19/2015 10:48:36 Stick, [name] ([id number]) -------------------------------------------------------------------------------- Patient/Caregiver Education Details Patient Name: Chris Riding K. Date of Service: 10/19/2015 10:45 AM Medical Record Number: [id number] Patient Account Number: [id number] Date of Birth/Gender: 1925/03/13 (82 y.o. Male) Treating RN: Phillis Haggis Primary Care Physician: Vonita Moss Other Clinician: Referring Physician: Vonita Moss Treating Physician/Extender: Altamese [place] in Treatment: 13 Education Assessment Education Provided To: Patient Education Topics Provided Wound/Skin Impairment: Handouts: Other: change dressing as ordered Methods: Demonstration, Explain/Verbal Responses: State content correctly Electronic Signature(s) Signed: 10/19/2015 5:19:35 PM By: Alejandro Mulling Entered By: Alejandro Mulling on 10/19/2015 11:06:53 Rabenold, Thang K. ([id number]) -------------------------------------------------------------------------------- Wound Assessment Details Patient Name: Chris Riding K. Date of Service: 10/19/2015 10:45 AM Medical Record Number: [id number] Patient Account Number: [id number] Date of Birth/Sex: 04/26/1925 (82 y.o. Male) Treating RN: Phillis Haggis Primary Care Physician: Vonita Moss Other Clinician: Referring Physician: Vonita Moss Treating Physician/Extender:  Maxwell Caul Weeks in Treatment: 13 Wound Status Wound Number: 5 Primary Diabetic Wound/Ulcer of the Lower Etiology: Extremity Wound Location: Left Calcaneus Wound Open Wounding Event: Blister Status: Date Acquired: 07/11/2015 Comorbid Hypertension, Type II Diabetes, History Weeks Of Treatment: 13 History: of pressure wounds, Osteoarthritis, Clustered Wound: No Neuropathy Photos Photo Uploaded By: Alejandro Mulling on 10/19/2015 17:15:52 Wound Measurements Length: (cm) 0.8 Width: (cm) 0.8 Depth: (cm) 0.2 Area: (cm) 0.503 Volume: (cm) 0.101 % Reduction in Area: 94.2% % Reduction in Volume: 88.3% Epithelialization: Small (1-33%) Tunneling: No Undermining: No Wound Description Classification: Grade 1 Wound Margin: Distinct, outline attached Exudate Amount: Large Exudate Type: Serosanguineous Exudate Color: red, brown Foul Odor After Cleansing: No Wound Bed Granulation Amount:  Small (1-33%) Exposed Structure Granulation Quality: Red, Pink Fascia Exposed: No Necrotic Amount: Medium (34-66%) Fat Layer Exposed: No Necrotic Quality: Adherent Slough Tendon Exposed: No Preisler, Jayen K. ([id number]) Muscle Exposed: No Joint Exposed: No Bone Exposed: No Limited to Skin Breakdown Periwound Skin Texture Texture Color No Abnormalities Noted: No No Abnormalities Noted: No Callus: No Atrophie Blanche: No Crepitus: No Cyanosis: No Excoriation: No Ecchymosis: No Fluctuance: No Erythema: No Friable: No Hemosiderin Staining: No Induration: No Mottled: No Localized Edema: Yes Pallor: No Rash: No Rubor: No Scarring: No Temperature / Pain Moisture Temperature: No Abnormality No Abnormalities Noted: No Tenderness on Palpation: Yes Dry / Scaly: Yes Maceration: No Moist: Yes Wound Preparation Ulcer Cleansing: Rinsed/Irrigated with Saline Topical Anesthetic Applied: Other: lidocaine 4%, Treatment Notes Wound #5 (Left Calcaneus) 1. Cleansed with: Clean wound  with Normal Saline 2. Anesthetic Topical Lidocaine 4% cream to wound bed prior to debridement 4. Dressing Applied: Iodoflex 5. Secondary Dressing Applied Dry Gauze Foam Kerlix/Conform 7. Secured with Tape Notes netting Electronic Signature(s) Signed: 10/19/2015 5:19:35 PM By: Thalia Bloodgood, [name] ([id number]) Entered By: Alejandro Mulling on 10/19/2015 10:59:48 Chris Russel ([id number]) -------------------------------------------------------------------------------- Wound Assessment Details Patient Name: Chris Riding K. Date of Service: 10/19/2015 10:45 AM Medical Record Number: [id number] Patient Account Number: [id number] Date of Birth/Sex: July 26, 1924 (82 y.o. Male) Treating RN: Phillis Haggis Primary Care Physician: Vonita Moss Other Clinician: Referring Physician: Vonita Moss Treating Physician/Extender: Altamese [place] in Treatment: 13 Wound Status Wound Number: 6R Primary Diabetic Wound/Ulcer of the Lower Etiology: Extremity Wound Location: Left Foot - Plantar Wound Open Wounding Event: Gradually Appeared Status: Date Acquired: 07/12/2015 Comorbid Hypertension, Type II Diabetes, History Weeks Of Treatment: 13 History: of pressure wounds, Osteoarthritis, Clustered Wound: No Neuropathy Photos Photo Uploaded By: Alejandro Mulling on 10/19/2015 17:16:07 Wound Measurements Length: (cm) 1 Width: (cm) 1.5 Depth: (cm) 0.3 Area: (cm) 1.178 Volume: (cm) 0.353 % Reduction in Area: 62.5% % Reduction in Volume: -12.4% Epithelialization: Medium (34-66%) Tunneling: No Undermining: No Wound Description Classification: Grade 1 Wound Margin: Distinct, outline attached Exudate Amount: Large Exudate Type: Serous Exudate Color: amber Foul Odor After Cleansing: No Wound Bed Granulation Amount: Small (1-33%) Exposed Structure Granulation Quality: Pink Fascia Exposed: No Necrotic Amount: Large (67-100%) Fat Layer Exposed: No Necrotic  Quality: Adherent Slough Tendon Exposed: No Schnitzler, Mikeal K. ([id number]) Muscle Exposed: No Joint Exposed: No Bone Exposed: No Limited to Skin Breakdown Periwound Skin Texture Texture Color No Abnormalities Noted: No No Abnormalities Noted: No Callus: Yes Atrophie Blanche: No Crepitus: No Cyanosis: No Excoriation: No Ecchymosis: No Fluctuance: No Erythema: No Friable: No Hemosiderin Staining: No Induration: No Mottled: No Localized Edema: Yes Pallor: No Rash: No Rubor: No Scarring: No Temperature / Pain Moisture Temperature: No Abnormality No Abnormalities Noted: No Tenderness on Palpation: Yes Dry / Scaly: No Maceration: No Moist: Yes Wound Preparation Ulcer Cleansing: Rinsed/Irrigated with Saline Topical Anesthetic Applied: Other: lidocaine 4%, Treatment Notes Wound #6R (Left, Plantar Foot) 1. Cleansed with: Clean wound with Normal Saline 2. Anesthetic Topical Lidocaine 4% cream to wound bed prior to debridement 4. Dressing Applied: Iodoflex 5. Secondary Dressing Applied Dry Gauze Foam Kerlix/Conform 7. Secured with Tape Notes netting Electronic Signature(s) Signed: 10/19/2015 5:19:35 PM By: Thalia Bloodgood, [name] ([id number]) Entered By: Alejandro Mulling on 10/19/2015 10:57:40 Chris Russel ([id number]) -------------------------------------------------------------------------------- Wound Assessment Details Patient Name: Chris Riding K. Date of Service: 10/19/2015 10:45 AM Medical Record Number: [id number] Patient Account Number: [id number] Date of Birth/Sex: 03-12-1925 (82 y.o. Male) Treating  RN: Phillis Haggis Primary Care Physician: Vonita Moss Other Clinician: Referring Physician: Vonita Moss Treating Physician/Extender: Altamese [place] in Treatment: 13 Wound Status Wound Number: 7 Primary Diabetic Wound/Ulcer of the Lower Etiology: Extremity Wound Location: Left Metatarsal head first Wound Open Wounding  Event: Gradually Appeared Status: Date Acquired: 07/11/2015 Comorbid Hypertension, Type II Diabetes, History Weeks Of Treatment: 13 History: of pressure wounds, Osteoarthritis, Clustered Wound: No Neuropathy Photos Photo Uploaded By: Alejandro Mulling on 10/19/2015 17:16:22 Wound Measurements Length: (cm) 3 Width: (cm) 3.2 Depth: (cm) 0.2 Area: (cm) 7.54 Volume: (cm) 1.508 % Reduction in Area: -540.1% % Reduction in Volume: -1060% Epithelialization: Small (1-33%) Tunneling: No Undermining: No Wound Description Classification: Grade 1 Wound Margin: Distinct, outline attached Exudate Amount: Large Exudate Type: Serosanguineous Exudate Color: red, brown Foul Odor After Cleansing: No Wound Bed Granulation Amount: Small (1-33%) Exposed Structure Granulation Quality: Pink Fascia Exposed: No Necrotic Amount: Large (67-100%) Fat Layer Exposed: No Necrotic Quality: Adherent Slough Tendon Exposed: No Pickler, Atha K. ([id number]) Muscle Exposed: No Joint Exposed: No Bone Exposed: No Limited to Skin Breakdown Periwound Skin Texture Texture Color No Abnormalities Noted: No No Abnormalities Noted: No Callus: Yes Atrophie Blanche: No Crepitus: No Cyanosis: No Excoriation: No Ecchymosis: No Fluctuance: No Erythema: No Friable: No Hemosiderin Staining: No Induration: No Mottled: No Localized Edema: Yes Pallor: No Rash: No Rubor: No Scarring: No Temperature / Pain Moisture Temperature: No Abnormality No Abnormalities Noted: No Tenderness on Palpation: Yes Dry / Scaly: No Maceration: Yes Moist: Yes Wound Preparation Ulcer Cleansing: Rinsed/Irrigated with Saline Topical Anesthetic Applied: Other: lidocaine 4%, Treatment Notes Wound #7 (Left Metatarsal head first) 1. Cleansed with: Clean wound with Normal Saline 2. Anesthetic Topical Lidocaine 4% cream to wound bed prior to debridement 4. Dressing Applied: Iodoflex 5. Secondary Dressing Applied Dry  Gauze Foam Kerlix/Conform 7. Secured with Tape Notes netting Electronic Signature(s) Signed: 10/19/2015 5:19:35 PM By: Thalia Bloodgood, [name] ([id number]) Entered By: Alejandro Mulling on 10/19/2015 10:58:47 Chris Russel ([id number]) -------------------------------------------------------------------------------- Vitals Details Patient Name: Chris Riding K. Date of Service: 10/19/2015 10:45 AM Medical Record Number: [id number] Patient Account Number: [id number] Date of Birth/Sex: Jan 02, 1925 (82 y.o. Male) Treating RN: Phillis Haggis Primary Care Physician: Vonita Moss Other Clinician: Referring Physician: Vonita Moss Treating Physician/Extender: Altamese Jay in Treatment: 13 Vital Signs Time Taken: 10:48 Temperature (F): 97.6 Height (in): 72 Pulse (bpm): 72 Weight (lbs): 195 Respiratory Rate (breaths/min): 18 Body Mass Index (BMI): 26.4 Blood Pressure (mmHg): 147/59 Reference Range: 80 - 120 mg / dl Electronic Signature(s) Signed: 10/19/2015 5:19:35 PM By: Alejandro Mulling Entered By: Alejandro Mulling on 10/19/2015 10:51:16

## 2015-10-20 NOTE — Progress Notes (Signed)
ETHAN, CLAYBURN ([id number]) Visit Report for 10/19/2015 Chief Complaint Document Details Patient Name: [name], [name]. Date of Service: 10/19/2015 10:45 AM Medical Record Patient Account Number: [id number] Number: Treating RN: Ahmed Prima [birth year] (82 y.o. Other Clinician: Date of Birth/Sex: Male) Treating Marquavion Venhuizen Primary Care Physician: Golden Pop Physician/Extender: G Referring Physician: Golden Pop Weeks in Treatment: 13 Information Obtained from: Patient Chief Complaint Has had swelling of both lower extremities with some ulceration on the right lower extremity. 07/20/15 patient returns today predominantly for a painful wound on the plantar aspect of his left foot of recent onset. Electronic Signature(s) Signed: 10/20/2015 8:03:58 AM By: [name] MD Entered By: [name] on 10/19/2015 11:46:33 [name], [name] ([id number]) -------------------------------------------------------------------------------- Debridement Details Patient Name: Chris Bottoms K. Date of Service: 10/19/2015 10:45 AM Medical Record Patient Account Number: [id number] Number: Treating RN: Ahmed Prima 1925/01/31 (82 y.o. Other Clinician: Date of Birth/Sex: Male) Treating Rivers Gassmann, [place] Primary Care Physician: CRISSMAN, [place]: G Referring Physician: Golden Pop Weeks in Treatment: 13 Debridement Performed for Wound #5 Left Calcaneus Assessment: Performed By: Physician [name], MD Debridement: Debridement Pre-procedure Yes Verification/Time Out Taken: Start Time: 11:33 Pain Control: Lidocaine 4% Topical Solution Level: Skin/Subcutaneous Tissue Total Area Debrided (L x 0.8 (cm) x 0.8 (cm) = 0.64 (cm) W): Tissue and other Viable, Non-Viable, Exudate, Fibrin/Slough, Subcutaneous material debrided: Instrument: Curette Bleeding: Minimum Hemostasis Achieved: Pressure End Time: 11:35 Procedural Pain: 0 Post  Procedural Pain: 0 Response to Treatment: Procedure was tolerated well Post Debridement Measurements of Total Wound Length: (cm) 0.8 Width: (cm) 0.8 Depth: (cm) 0.3 Volume: (cm) 0.151 Post Procedure Diagnosis Same as Pre-procedure Electronic Signature(s) Signed: 10/19/2015 5:19:35 PM By: Alric Quan Signed: 10/20/2015 8:03:58 AM By: [name] MD Entered By: [name] on 10/19/2015 11:44:21 Tetreault, Mang K. ([id number]) -------------------------------------------------------------------------------- Debridement Details Patient Name: Chris Bottoms K. Date of Service: 10/19/2015 10:45 AM Medical Record Patient Account Number: [id number] Number: Treating RN: Ahmed Prima July 31, 1924 (82 y.o. Other Clinician: Date of Birth/Sex: Male) Treating Jaquel Glassburn, [place] Primary Care Physician: Jeananne Rama, [place]: G Referring Physician: Golden Pop Weeks in Treatment: 13 Debridement Performed for Wound #6R Left,Plantar Foot Assessment: Performed By: Physician [name], MD Debridement: Debridement Pre-procedure Yes Verification/Time Out Taken: Start Time: 11:22 Pain Control: Lidocaine 4% Topical Solution Level: Skin/Subcutaneous Tissue Total Area Debrided (L x 1 (cm) x 1.5 (cm) = 1.5 (cm) W): Tissue and other Viable, Non-Viable, Exudate, Fibrin/Slough, Subcutaneous material debrided: Instrument: Blade, Curette, Forceps Bleeding: Minimum Hemostasis Achieved: Pressure End Time: 11:25 Procedural Pain: 0 Post Procedural Pain: 0 Response to Treatment: Procedure was tolerated well Post Debridement Measurements of Total Wound Length: (cm) 1 Width: (cm) 1.5 Depth: (cm) 0.5 Volume: (cm) 0.589 Post Procedure Diagnosis Same as Pre-procedure Electronic Signature(s) Signed: 10/19/2015 5:19:35 PM By: Alric Quan Signed: 10/20/2015 8:03:58 AM By: [name] MD Entered By: [name] on 10/19/2015 11:44:36 [name], Chris  K. ([id number]) -------------------------------------------------------------------------------- Debridement Details Patient Name: Chris Bottoms K. Date of Service: 10/19/2015 10:45 AM Medical Record Patient Account Number: [id number] Number: Treating RN: Ahmed Prima Nov 17, 1924 (82 y.o. Other Clinician: Date of Birth/Sex: Male) Treating Trisha Morandi, [place] Primary Care Physician: Golden Pop Physician/Extender: G Referring Physician: Golden Pop Weeks in Treatment: 13 Debridement Performed for Wound #7 Left Metatarsal head first Assessment: Performed By: Physician [name], MD Debridement: Debridement Pre-procedure Yes Verification/Time Out Taken: Start Time: 11:25 Pain Control: Lidocaine 4% Topical Solution Level: Skin/Subcutaneous Tissue Total Area Debrided (L x 3 (cm) x 3.2 (  cm) = 9.6 (cm) W): Tissue and other Viable, Non-Viable, Exudate, Fibrin/Slough, Subcutaneous material debrided: Instrument: Blade, Curette, Forceps Bleeding: Minimum Hemostasis Achieved: Pressure End Time: 11:33 Procedural Pain: 0 Post Procedural Pain: 0 Response to Treatment: Procedure was tolerated well Post Debridement Measurements of Total Wound Length: (cm) 3 Width: (cm) 3.4 Depth: (cm) 0.4 Volume: (cm) 3.204 Post Procedure Diagnosis Same as Pre-procedure Electronic Signature(s) Signed: 10/19/2015 5:19:35 PM By: Alric Quan Signed: 10/20/2015 8:03:58 AM By: [name] MD Entered By: [name] on 10/19/2015 11:45:40 [name], [name] ([id number]) -------------------------------------------------------------------------------- HPI Details Patient Name: Chris Bottoms K. Date of Service: 10/19/2015 10:45 AM Medical Record Patient Account Number: [id number] Number: Treating RN: Ahmed Prima Apr 25, 1925 (82 y.o. Other Clinician: Date of Birth/Sex: Male) Treating Veera Stapleton Primary Care Physician: Golden Pop Physician/Extender:  G Referring Physician: Golden Pop Weeks in Treatment: 13 History of Present Illness Location: had swelling of the right lower extremity with some ulceration on the medial part of his calf and on the right fifth toe Quality: Patient reports No Pain. Severity: Patient states wound (s) are getting better. Duration: Patient has had the wound for < 2 weeks prior to presenting for treatment Context: The wound appeared gradually over time. nail on his right fifth toe came off loose and he pried it Modifying Factors: Other treatment(s) tried include:vascular workup where he is going to be seeing Dr. Lucky Cowboy on Monday Associated Signs and Symptoms: Patient reports presence of swelling HPI Description: Very pleasant 82 year old with a past medical history significant for type 2 diabetes. He underwent left knee surgery in February 2015. He had an angioplasty of his R posterior tibial on 10/30/13. Vascular workup done in April of this year showed an ABI on the right to be noncompressible and the left to be 0.86. About a week ago he noticed some swelling on the right lower extremity and then an ulceration on the medial part of his calf. He also noted that the right fifth nail bed had a loose nail and he pried it open and now has an open wound. He has no evidence of cellulitis. he was seen by the nurse practitioner at his PCPs office and put on Bactroban ointment and doxycycline for 10 days. 01/21/2015 -- he was seen by Dr. Ronalee Belts and though we do not have the note, we know he had used an Unna's boot and asked him to come to see as again for a compression bandage. The patient has no fresh complaints. 07/20/15; the patient returns today predominantly for her wounds on his left plantar foot. He was seen here in the summer last on 8/19 for wounds on his right medial calf and right fifth nail bed. I don't know that he actually returned to be discharged, he is wearing compression on bilateral legs. He has  known peripheral arterial disease in the setting of type 2 diabetes having an angioplasty on the right posterior tibial artery on 10/30/13. His ABI calculated in the clinic today was 0.97 on the right. I don't think the left could be done. The patient tells me that roughly a week ago he started to develop pain in the plantar aspect of his left foot. I think his caregiver noticed the wounds and he is here for our review events. He is minimally ambulatory, spends most of his time in a wheelchair. I am not certain of the issue here. 07/27/15; the patient is here for 3 wounds. The first over his left first met head, second over the left  medial heel and the third over the left fourth metatarsal head. Culture from the fourth metatarsal head last week showed Klebsiella. This would not be sensitive to the doxycycline possibly I have therefore changed him to Keflex to which the Klebsiella should be sensitive 08/10/15; patient returns today. He was not seen last week. He does have home health. The area over the left fourth metatarsal head is almost completely closed the area over the medial aspect of his left first metatarsal head and the left medial heel still required debridement Flam, [name] ([id number]) 08/17/15 the area over the fourth metatarsal head has resolved. The area on his left first metatarsal head lateral aspect and the left medial heel still require surgical debridement although the surface is beginning to look a bit better. Is followed with vascular surgery and is apparently being planned for an angiogram and possible stent on 3/28 08/23/15 the patient complained of pain over his fourth metatarsal head, the previously closed wound. Physical exam did not really show a source he had a small amount of callus which I lightly removed to expose a large amount of purulent drainage. The area was then fully debridement the area on the first metatarsal head lateral aspect and left medial heel appear  improved [Iodosorb] 08/31/15; the patient is completed a well-tolerated angioplasty of the left posterior tibial artery. The left common femoral profunda femoris and superficial femoral arteries are diffusely diseased but there was no hemodynamically significant lesion. Culture I did last week from the left fourth metatarsal head enterococcus I'm going to give him another week of Keflex which should cover this. Patient states he had some pain but it seems to have resolved in the sole of his foot 09/07/15 he has completed his Keflex yesterday. 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 Using Iodoflex. He is completed antibiotics. 09/21/15; MRI next week, we will see him in 2 weeks. 10/05/15; miraculously this patient's MRI did not show osteomyelitis in his left foot in spite of the very deep wound on the lateral aspect of his first metatarsal phalangeal, and a very deep reoccurring wound over the plantar aspect of his left fourth metatarsal head there is no osteomyelitis!!! He did call in yesterday to report marked swelling in the left leg with some tenderness in his knee. He apparently saw his primary physician who gave him antibiotics for a UTI although I'm not exactly sure what these antibiotics were. He has been using it Iodoflex 10/19/15; the patient was not here last week. He is having Iodoflex applied at home and changed by home health. The duplex ultrasound of the left leg I ordered 2 weeks ago due to increased swelling and tenderness does not seem to have been done. Electronic Signature(s) Signed: 10/20/2015 8:03:58 AM By: [name] MD Entered By: [name] on 10/19/2015 11:48:43 [name] ([id number]) -------------------------------------------------------------------------------- Physical Exam Details Patient Name: Chris Bottoms K. Date of Service: 10/19/2015  10:45 AM Medical Record Patient Account Number: [id number] Number: Treating RN: Ahmed Prima 1924/09/22 (82 y.o. Other Clinician: Date of Birth/Sex: Male) Treating Braya Habermehl Primary Care Physician: Golden Pop Physician/Extender: G Referring Physician: Golden Pop Weeks in Treatment: 13 Notes Wound exam; extensive debridement on the left fourth metatarsal head. Base of the wound does not appear to be unhealthy although it is deeper. The area on the lateral aspect of the  left first metatarsal head is a deeply necrotic wound with exposed tendon bone. I have attempted aggressive debridement surface area however I don't know that we can do anything further here. I'm going to continue the Iodoflex on both of the areas for now. If I could get to a base on the metatarsal head that would support healing I would consider an Apligraf. The area on the right lateral heel is superficial but still requires debridement Electronic Signature(s) Signed: 10/20/2015 8:03:58 AM By: [name] MD Entered By: [name] on 10/19/2015 12:06:41 [name] ([id number]) -------------------------------------------------------------------------------- Physician Orders Details Patient Name: Chris Bottoms K. Date of Service: 10/19/2015 10:45 AM Medical Record Patient Account Number: [id number] Number: Treating RN: Ahmed Prima 06/18/1924 (82 y.o. Other Clinician: Date of Birth/Sex: Male) Treating Mirl Hillery, [place] Primary Care Physician: Golden Pop Physician/Extender: G Referring Physician: Golden Pop Weeks in Treatment: 39 Verbal / Phone Orders: Yes Clinician: Carolyne Fiscal, Debi Read Back and Verified: Yes Diagnosis Coding Wound Cleansing Wound #5 Left Calcaneus o Cleanse wound with mild soap and water Wound #6R Left,Plantar Foot o Cleanse wound with mild soap and water Wound #7 Left Metatarsal head first o Cleanse wound with mild soap and  water Anesthetic Wound #5 Left Calcaneus o Topical Lidocaine 4% cream applied to wound bed prior to debridement Wound #6R Left,Plantar Foot o Topical Lidocaine 4% cream applied to wound bed prior to debridement Wound #7 Left Metatarsal head first o Topical Lidocaine 4% cream applied to wound bed prior to debridement Primary Wound Dressing Wound #5 Left Calcaneus o Iodoflex Wound #6R Left,Plantar Foot o Iodoflex Wound #7 Left Metatarsal head first o Iodoflex Secondary Dressing Wound #5 Left Calcaneus o Gauze and Kerlix/Conform o Foam [name], [name] ([id number]) Wound #6R Left,Plantar Foot o Gauze and Kerlix/Conform o Foam Wound #7 Left Metatarsal head first o Gauze and Kerlix/Conform o Foam Dressing Change Frequency Wound #5 Left Calcaneus o Change dressing every day. Wound #6R Left,Plantar Foot o Change dressing every day. Wound #7 Left Metatarsal head first o Change dressing every day. Follow-up Appointments Wound #5 Left Calcaneus o Return Appointment in 2 weeks. Wound #6R Left,Plantar Foot o Return Appointment in 2 weeks. Wound #7 Left Metatarsal head first o Return Appointment in 2 weeks. Off-Loading Wound #5 Left Calcaneus o Turn and reposition every 2 hours Wound #6R Left,Plantar Foot o Turn and reposition every 2 hours Wound #7 Left Metatarsal head first o Turn and reposition every 2 hours Additional Orders / Instructions Wound #5 Left Calcaneus o Increase protein intake. o Activity as tolerated Wound #6R Left,Plantar Foot o Increase protein intake. o Activity as tolerated [name], [name] ([id number]) Wound #7 Left Metatarsal head first o Increase protein intake. o Activity as tolerated Home Health Wound #5 Left Calcaneus o Continue Home Health Visits - [place] Nurse may visit PRN to address patientos wound care needs. o FACE TO FACE ENCOUNTER: MEDICARE and MEDICAID  PATIENTS: I certify that this patient is under my care and that I had a face-to-face encounter that meets the physician face-to-face encounter requirements with this patient on this date. The encounter with the patient was in whole or in part for the following MEDICAL CONDITION: (primary reason for [place]) MEDICAL NECESSITY: I certify, that based on my findings, NURSING services are a medically necessary home health service. HOME BOUND STATUS: I certify that my clinical findings support that this patient is homebound (i.e., Due to illness or injury, pt requires aid of supportive devices such  as crutches, cane, wheelchairs, walkers, the use of special transportation or the assistance of another person to leave their place of residence. There is a normal inability to leave the home and doing so requires considerable and taxing effort. Other absences are for medical reasons / religious services and are infrequent or of short duration when for other reasons). o If current dressing causes regression in wound condition, may D/C ordered dressing product/s and apply Normal Saline Moist Dressing daily until next [place] / Other MD appointment. [place] of regression in wound condition at [phone]. o Please direct any NON-WOUND related issues/requests for orders to patient's Primary Care Physician Wound #6R [place] Nurse may visit PRN to address patientos wound care needs. o FACE TO FACE ENCOUNTER: MEDICARE and MEDICAID PATIENTS: I certify that this patient is under my care and that I had a face-to-face encounter that meets the physician face-to-face encounter requirements with this patient on this date. The encounter with the patient was in whole or in part for the following MEDICAL CONDITION: (primary reason for [place]) MEDICAL NECESSITY: I certify, that based on my findings, NURSING  services are a medically necessary home health service. HOME BOUND STATUS: I certify that my clinical findings support that this patient is homebound (i.e., Due to illness or injury, pt requires aid of supportive devices such as crutches, cane, wheelchairs, walkers, the use of special transportation or the assistance of another person to leave their place of residence. There is a normal inability to leave the home and doing so requires considerable and taxing effort. Other absences are for medical reasons / religious services and are infrequent or of short duration when for other reasons). o If current dressing causes regression in wound condition, may D/C ordered dressing product/s and apply Normal Saline Moist Dressing daily until next [place] / Other MD appointment. [place] of regression in wound condition at [phone]. o Please direct any NON-WOUND related issues/requests for orders to patient's Primary Care Physician Wound #7 Left Metatarsal head first o [place] Visits CINQUE, BE[place] ([id number]) o [place] Nurse may visit PRN to address patientos wound care needs. o FACE TO FACE ENCOUNTER: MEDICARE and MEDICAID PATIENTS: I certify that this patient is under my care and that I had a face-to-face encounter that meets the physician face-to-face encounter requirements with this patient on this date. The encounter with the patient was in whole or in part for the following MEDICAL CONDITION: (primary reason for [place]) MEDICAL NECESSITY: I certify, that based on my findings, NURSING services are a medically necessary home health service. HOME BOUND STATUS: I certify that my clinical findings support that this patient is homebound (i.e., Due to illness or injury, pt requires aid of supportive devices such as crutches, cane, wheelchairs, walkers, the use of special transportation or the assistance of another person to  leave their place of residence. There is a normal inability to leave the home and doing so requires considerable and taxing effort. Other absences are for medical reasons / religious services and are infrequent or of short duration when for other reasons). o If current dressing causes regression in wound condition, may D/C ordered dressing product/s and apply Normal Saline Moist Dressing daily until next [place] / Other MD appointment. [place] of regression in wound condition at [phone]. o Please direct any NON-WOUND related issues/requests for orders to  patient's Primary Care Physician Electronic Signature(s) Signed: 10/19/2015 5:19:35 PM By: Alric Quan Signed: 10/20/2015 8:03:58 AM By: [name] MD Entered By: Alric Quan on 10/19/2015 12:33:56 [name] ([id number]) -------------------------------------------------------------------------------- Problem List Details Patient Name: Chris Bottoms K. Date of Service: 10/19/2015 10:45 AM Medical Record Patient Account Number: [id number] Number: Treating RN: Ahmed Prima 1924-10-01 (82 y.o. 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/20/2015 Yes layer exposed L97.511 Non-pressure chronic ulcer of other part of right foot 07/20/2015 Yes limited to breakdown of skin E11.621 Type 2 diabetes mellitus with foot ulcer 07/20/2015 Yes E11.51 Type 2 diabetes mellitus with diabetic peripheral 07/20/2015 Yes angiopathy without gangrene Inactive Problems Resolved Problems Electronic Signature(s) Signed: 10/20/2015 8:03:58 AM By: [name] MD Entered By: [name] on  10/19/2015 11:43:27 [name], [name] ([id number]) -------------------------------------------------------------------------------- Progress Note Details Patient Name: Chris Bottoms K. Date of Service: 10/19/2015 10:45 AM Medical Record Patient Account Number: [id number] Number: Treating RN: Ahmed Prima [birth year] (82 y.o. Other Clinician: Date of Birth/Sex: Male) Treating Shavana Calder Primary Care Physician: Golden Pop Physician/Extender: G Referring Physician: Golden Pop Weeks in Treatment: 13 Subjective Chief Complaint Information obtained from Patient Has had swelling of both lower extremities with some ulceration on the right lower extremity. 07/20/15 patient returns today predominantly for a painful wound on the plantar aspect of his left foot of recent onset. History of Present Illness (HPI) The following HPI elements were documented for the patient's wound: Location: had swelling of the right lower extremity with some ulceration on the medial part of his calf and on the right fifth toe Quality: Patient reports No Pain. Severity: Patient states wound (s) are getting better. Duration: Patient has had the wound for < 2 weeks prior to presenting for treatment Context: The wound appeared gradually over time. nail on his right fifth toe came off loose and he pried it Modifying Factors: Other treatment(s) tried include:vascular workup where he is going to be seeing Dr. Lucky Cowboy on Monday Associated Signs and Symptoms: Patient reports presence of swelling Very pleasant 82 year old with a past medical history significant for type 2 diabetes. He underwent left knee surgery in February 2015. He had an angioplasty of his R posterior tibial on 10/30/13. Vascular workup done in April of this year showed an ABI on the right to be noncompressible and the left to be 0.86. About a week ago he noticed some swelling on the right lower extremity and then an ulceration on  the medial part of his calf. He also noted that the right fifth nail bed had a loose nail and he pried it open and now has an open wound. He has no evidence of cellulitis. he was seen by the nurse practitioner at his PCPs office and put on Bactroban ointment and doxycycline for 10 days. 01/21/2015 -- he was seen by Dr. Ronalee Belts and though we do not have the note, we know he had used an Unna's boot and asked him to come to see as again for a compression bandage. The patient has no fresh complaints. 07/20/15; the patient returns today predominantly for her wounds on his left plantar foot. He was seen here in the summer last on 8/19 for wounds on his right medial calf and right fifth nail bed. I don't know  that he actually returned to be discharged, he is wearing compression on bilateral legs. He has known peripheral arterial disease in the setting of type 2 diabetes having an angioplasty on the right posterior tibial artery on 10/30/13. His ABI calculated in the clinic today was 0.97 on the right. I don't think the left could be done. The patient tells me that roughly a week ago he started to develop pain in the plantar aspect of his left foot. I [name], [name] ([id number]) think his caregiver noticed the wounds and he is here for our review events. He is minimally ambulatory, spends most of his time in a wheelchair. I am not certain of the issue here. 07/27/15; the patient is here for 3 wounds. The first over his left first met head, second over the left medial heel and the third over the left fourth metatarsal head. Culture from the fourth metatarsal head last week showed Klebsiella. This would not be sensitive to the doxycycline possibly I have therefore changed him to Keflex to which the Klebsiella should be sensitive 08/10/15; patient returns today. He was not seen last week. He does have home health. The area over the left fourth metatarsal head is almost completely closed the area over the  medial aspect of his left first metatarsal head and the left medial heel still required debridement 08/17/15 the area over the fourth metatarsal head has resolved. The area on his left first metatarsal head lateral aspect and the left medial heel still require surgical debridement although the surface is beginning to look a bit better. Is followed with vascular surgery and is apparently being planned for an angiogram and possible stent on 3/28 08/23/15 the patient complained of pain over his fourth metatarsal head, the previously closed wound. Physical exam did not really show a source he had a small amount of callus which I lightly removed to expose a large amount of purulent drainage. The area was then fully debridement the area on the first metatarsal head lateral aspect and left medial heel appear improved [Iodosorb] 08/31/15; the patient is completed a well-tolerated angioplasty of the left posterior tibial artery. The left common femoral profunda femoris and superficial femoral arteries are diffusely diseased but there was no hemodynamically significant lesion. Culture I did last week from the left fourth metatarsal head enterococcus I'm going to give him another week of Keflex which should cover this. Patient states he had some pain but it seems to have resolved in the sole of his foot 09/07/15 he has completed his Keflex yesterday. 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 Using Iodoflex. He is completed antibiotics. 09/21/15; MRI next week, we will see him in 2 weeks. 10/05/15; miraculously this patient's MRI did not show osteomyelitis in his left foot in spite of the very deep wound on the lateral aspect of his first metatarsal phalangeal, and a very deep reoccurring wound over the plantar aspect of his left fourth metatarsal head there is no osteomyelitis!!! He did call in  yesterday to report marked swelling in the left leg with some tenderness in his knee. He apparently saw his primary physician who gave him antibiotics for a UTI although I'm not exactly sure what these antibiotics were. He has been using it Iodoflex 10/19/15; the patient was not here last week. He is having Iodoflex applied at home and changed by  home health. The duplex ultrasound of the left leg I ordered 2 weeks ago due to increased swelling and tenderness does not seem to have been done. Objective Constitutional Vitals Time Taken: 10:48 AM, Height: 72 in, Weight: 195 lbs, BMI: 26.4, Temperature: 97.6 F, Pulse: 72 bpm, Respiratory Rate: 18 breaths/min, Blood Pressure: 147/59 mmHg. Integumentary (Hair, Skin) Wound #5 status is Open. Original cause of wound was Blister. The wound is located on the Left Kaster, Kamar K. ([id number]) Calcaneus. The wound measures 0.8cm length x 0.8cm width x 0.2cm depth; 0.503cm^2 area and 0.101cm^3 volume. The wound is limited to skin breakdown. There is no tunneling or undermining noted. There is a large amount of serosanguineous drainage noted. The wound margin is distinct with the outline attached to the wound base. There is small (1-33%) red, pink granulation within the wound bed. There is a medium (34-66%) amount of necrotic tissue within the wound bed including Adherent Slough. The periwound skin appearance exhibited: Localized Edema, Dry/Scaly, Moist. The periwound skin appearance did not exhibit: Callus, Crepitus, Excoriation, Fluctuance, Friable, Induration, Rash, Scarring, Maceration, Atrophie Blanche, Cyanosis, Ecchymosis, Hemosiderin Staining, Mottled, Pallor, Rubor, Erythema. Periwound temperature was noted as No Abnormality. The periwound has tenderness on palpation. Wound #6R status is Open. Original cause of wound was Gradually Appeared. The wound is located on the [place]. The wound measures 1cm length x 1.5cm width x 0.3cm depth;  1.178cm^2 area and 0.353cm^3 volume. The wound is limited to skin breakdown. There is no tunneling or undermining noted. There is a large amount of serous drainage noted. The wound margin is distinct with the outline attached to the wound base. There is small (1-33%) pink granulation within the wound bed. There is a large (67-100%) amount of necrotic tissue within the wound bed including Adherent Slough. The periwound skin appearance exhibited: Callus, Localized Edema, Moist. The periwound skin appearance did not exhibit: Crepitus, Excoriation, Fluctuance, Friable, Induration, Rash, Scarring, Dry/Scaly, Maceration, Atrophie Blanche, Cyanosis, Ecchymosis, Hemosiderin Staining, Mottled, Pallor, Rubor, Erythema. Periwound temperature was noted as No Abnormality. The periwound has tenderness on palpation. Wound #7 status is Open. Original cause of wound was Gradually Appeared. The wound is located on the Left Metatarsal head first. The wound measures 3cm length x 3.2cm width x 0.2cm depth; 7.54cm^2 area and 1.508cm^3 volume. The wound is limited to skin breakdown. There is no tunneling or undermining noted. There is a large amount of serosanguineous drainage noted. The wound margin is distinct with the outline attached to the wound base. There is small (1-33%) pink granulation within the wound bed. There is a large (67-100%) amount of necrotic tissue within the wound bed including Adherent Slough. The periwound skin appearance exhibited: Callus, Localized Edema, Maceration, Moist. The periwound skin appearance did not exhibit: Crepitus, Excoriation, Fluctuance, Friable, Induration, Rash, Scarring, Dry/Scaly, Atrophie Blanche, Cyanosis, Ecchymosis, Hemosiderin Staining, Mottled, Pallor, Rubor, Erythema. Periwound temperature was noted as No Abnormality. The periwound has tenderness on palpation. Assessment Active Problems ICD-10 L97.522 - Non-pressure chronic ulcer of other part of left foot with  fat layer exposed L97.511 - Non-pressure chronic ulcer of other part of right foot limited to breakdown of skin E11.621 - Type 2 diabetes mellitus with foot ulcer E11.51 - Type 2 diabetes mellitus with diabetic peripheral angiopathy without gangrene [name], [name] ([id number]) Procedures Wound #5 Wound #5 is a Diabetic Wound/Ulcer of the Lower Extremity located on the Left Calcaneus . There was a Skin/Subcutaneous Tissue Debridement ([id number]) debridement with total area of 0.64 sq cm performed  by [name], MD. with the following instrument(s): Curette to remove Viable and Non-Viable tissue/material including Exudate, Fibrin/Slough, and Subcutaneous after achieving pain control using Lidocaine 4% Topical Solution. A time out was conducted prior to the start of the procedure. A Minimum amount of bleeding was controlled with Pressure. The procedure was tolerated well with a pain level of 0 throughout and a pain level of 0 following the procedure. Post Debridement Measurements: 0.8cm length x 0.8cm width x 0.3cm depth; 0.151cm^3 volume. Post procedure Diagnosis Wound #5: Same as Pre-Procedure Wound #6R Wound #6R is a Diabetic Wound/Ulcer of the Lower Extremity located on the Left,Plantar Foot . There was a Skin/Subcutaneous Tissue Debridement ([id number]) debridement with total area of 1.5 sq cm performed by [name], MD. with the following instrument(s): Blade, Curette, and Forceps to remove Viable and Non-Viable tissue/material including Exudate, Fibrin/Slough, and Subcutaneous after achieving pain control using Lidocaine 4% Topical Solution. A time out was conducted prior to the start of the procedure. A Minimum amount of bleeding was controlled with Pressure. The procedure was tolerated well with a pain level of 0 throughout and a pain level of 0 following the procedure. Post Debridement Measurements: 1cm length x 1.5cm width x 0.5cm depth; 0.589cm^3 volume. Post  procedure Diagnosis Wound #6R: Same as Pre-Procedure Wound #7 Wound #7 is a Diabetic Wound/Ulcer of the Lower Extremity located on the Left Metatarsal head first . There was a Skin/Subcutaneous Tissue Debridement ([id number]) debridement with total area of 9.6 sq cm performed by [name], MD. with the following instrument(s): Blade, Curette, and Forceps to remove Viable and Non-Viable tissue/material including Exudate, Fibrin/Slough, and Subcutaneous after achieving pain control using Lidocaine 4% Topical Solution. A time out was conducted prior to the start of the procedure. A Minimum amount of bleeding was controlled with Pressure. The procedure was tolerated well with a pain level of 0 throughout and a pain level of 0 following the procedure. Post Debridement Measurements: 3cm length x 3.4cm width x 0.4cm depth; 3.204cm^3 volume. Post procedure Diagnosis Wound #7: Same as Pre-Procedure Plan Wound Cleansing: Wound #5 Left Calcaneus: Cleanse wound with mild soap and water Wound #6R Left,Plantar Foot: Cleanse wound with mild soap and water [name], [name] ([id number]) Wound #7 Left Metatarsal head first: Cleanse wound with mild soap and water Anesthetic: Wound #5 Left Calcaneus: Topical Lidocaine 4% cream applied to wound bed prior to debridement Wound #6R Left,Plantar Foot: Topical Lidocaine 4% cream applied to wound bed prior to debridement Wound #7 Left Metatarsal head first: Topical Lidocaine 4% cream applied to wound bed prior to debridement Primary Wound Dressing: Wound #5 Left Calcaneus: Iodoflex Wound #6R Left,Plantar Foot: Iodoflex Wound #7 Left Metatarsal head first: Iodoflex Secondary Dressing: Wound #5 Left Calcaneus: Gauze and Kerlix/Conform Wound #6R Left,Plantar Foot: Gauze and Kerlix/Conform Wound #7 Left Metatarsal head first: Gauze and Kerlix/Conform Dressing Change Frequency: Wound #5 Left Calcaneus: Change dressing every day. Wound #6R  Left,Plantar Foot: Change dressing every day. Wound #7 Left Metatarsal head first: Change dressing every day. Follow-up Appointments: Wound #5 Left Calcaneus: Return Appointment in 2 weeks. Wound #6R Left,Plantar Foot: Return Appointment in 2 weeks. Wound #7 Left Metatarsal head first: Return Appointment in 2 weeks. Off-Loading: Wound #5 Left Calcaneus: Turn and reposition every 2 hours Wound #6R Left,Plantar Foot: Turn and reposition every 2 hours Wound #7 Left Metatarsal head first: Turn and reposition every 2 hours Additional Orders / Instructions: Wound #5 Left Calcaneus: Increase protein intake. Activity as tolerated Wound #  6R Left,Plantar Foot: Increase protein intake. Activity as tolerated [name], [name] ([id number]) Wound #7 Left Metatarsal head first: Increase protein intake. Activity as tolerated Home Health: Wound #5 Left Calcaneus: Continue Home Health Visits - [place] Nurse may visit PRN to address patient s wound care needs. FACE TO FACE ENCOUNTER: MEDICARE and MEDICAID PATIENTS: I certify that this patient is under my care and that I had a face-to-face encounter that meets the physician face-to-face encounter requirements with this patient on this date. The encounter with the patient was in whole or in part for the following MEDICAL CONDITION: (primary reason for [place]) MEDICAL NECESSITY: I certify, that based on my findings, NURSING services are a medically necessary home health service. HOME BOUND STATUS: I certify that my clinical findings support that this patient is homebound (i.e., Due to illness or injury, pt requires aid of supportive devices such as crutches, cane, wheelchairs, walkers, the use of special transportation or the assistance of another person to leave their place of residence. There is a normal inability to leave the home and doing so requires considerable and taxing effort. Other absences are for medical reasons  / religious services and are infrequent or of short duration when for other reasons). If current dressing causes regression in wound condition, may D/C ordered dressing product/s and apply Normal Saline Moist Dressing daily until next [place] / Other MD appointment. [place] of regression in wound condition at [phone]. Please direct any NON-WOUND related issues/requests for orders to patient's Primary Care Physician Wound #6R Left,Plantar Foot: [place] Nurse may visit PRN to address patient s wound care needs. FACE TO FACE ENCOUNTER: MEDICARE and MEDICAID PATIENTS: I certify that this patient is under my care and that I had a face-to-face encounter that meets the physician face-to-face encounter requirements with this patient on this date. The encounter with the patient was in whole or in part for the following MEDICAL CONDITION: (primary reason for [place]) MEDICAL NECESSITY: I certify, that based on my findings, NURSING services are a medically necessary home health service. HOME BOUND STATUS: I certify that my clinical findings support that this patient is homebound (i.e., Due to illness or injury, pt requires aid of supportive devices such as crutches, cane, wheelchairs, walkers, the use of special transportation or the assistance of another person to leave their place of residence. There is a normal inability to leave the home and doing so requires considerable and taxing effort. Other absences are for medical reasons / religious services and are infrequent or of short duration when for other reasons). If current dressing causes regression in wound condition, may D/C ordered dressing product/s and apply Normal Saline Moist Dressing daily until next [place] / Other MD appointment. [place] of regression in wound condition at [phone]. Please direct any NON-WOUND related  issues/requests for orders to patient's Primary Care Physician Wound #7 Left Metatarsal head first: [place] Nurse may visit PRN to address patient s wound care needs. FACE TO FACE ENCOUNTER: MEDICARE and MEDICAID PATIENTS: I certify that this patient is under my care and that I had a face-to-face encounter that meets the physician face-to-face encounter requirements with this patient on this date. The encounter with the patient was in whole or in part for the following MEDICAL CONDITION: (primary reason for [place]) MEDICAL NECESSITY: I certify, that based on my findings, NURSING services are a medically  necessary home health service. HOME BOUND STATUS: I certify that my clinical findings support that this patient is homebound (i.e., Due to illness or injury, pt requires aid of supportive devices such as crutches, cane, wheelchairs, walkers, the use of special transportation or the assistance of another person to leave their place of residence. There is a normal inability to leave the home and doing so requires considerable and taxing effort. Other absences are for medical reasons / religious services and are infrequent or of short duration when for other reasons). [name], SALLADE. ([id number]) If current dressing causes regression in wound condition, may D/C ordered dressing product/s and apply Normal Saline Moist Dressing daily until next [place] / Other MD appointment. [place] of regression in wound condition at [phone]. Please direct any NON-WOUND related issues/requests for orders to patient's Primary Care Physician #1 I am going to continue with Iodoflex for now to all wound areas. Electronic Signature(s) Signed: 10/20/2015 8:03:58 AM By: [name] MD Entered By: [name] on 10/19/2015 12:07:11 [name]  ([id number]) -------------------------------------------------------------------------------- SuperBill Details Patient Name: Chris Bottoms K. Date of Service: 10/19/2015 Medical Record Patient Account Number: [id number] Number: Treating RN: Ahmed Prima 01-27-1925 (82 y.o. Other Clinician: Date of Birth/Sex: Male) Treating Caelyn Route Primary Care Physician: Golden Pop Physician/Extender: G Referring Physician: Golden Pop Weeks in Treatment: 13 Diagnosis Coding ICD-10 Codes Code Description [phone] Non-pressure chronic ulcer of other part of left foot with fat layer exposed L97.511 Non-pressure chronic ulcer of other part of right foot limited to breakdown of skin E11.621 Type 2 diabetes mellitus with foot ulcer E11.51 Type 2 diabetes mellitus with diabetic peripheral angiopathy without gangrene Facility Procedures CPT4 Code: [id number] Description: 22979 - DEB SUBQ TISSUE 20 SQ CM/< ICD-10 Description Diagnosis E11.621 Type 2 diabetes mellitus with foot ulcer Modifier: Quantity: 1 Physician Procedures CPT4 Code: [id number] Description: 17408 - WC PHYS SUBQ TISS 20 SQ CM ICD-10 Description Diagnosis E11.621 Type 2 diabetes mellitus with foot ulcer Modifier: Quantity: 1 Electronic Signature(s) Signed: 10/20/2015 8:03:58 AM By: [name] MD Entered By: [name] on 10/19/2015 12:07:44

## 2015-10-26 ENCOUNTER — Ambulatory Visit: Payer: PPO | Admitting: Internal Medicine

## 2015-10-28 ENCOUNTER — Encounter: Payer: Self-pay | Admitting: Cardiovascular Disease

## 2015-10-28 ENCOUNTER — Ambulatory Visit (INDEPENDENT_AMBULATORY_CARE_PROVIDER_SITE_OTHER): Payer: PPO | Admitting: Cardiovascular Disease

## 2015-10-28 VITALS — BP 120/60 | HR 69 | Ht 72.0 in

## 2015-10-28 DIAGNOSIS — E785 Hyperlipidemia, unspecified: Secondary | ICD-10-CM

## 2015-10-28 DIAGNOSIS — I739 Peripheral vascular disease, unspecified: Secondary | ICD-10-CM | POA: Insufficient documentation

## 2015-10-28 DIAGNOSIS — I5032 Chronic diastolic (congestive) heart failure: Secondary | ICD-10-CM | POA: Diagnosis not present

## 2015-10-28 DIAGNOSIS — I10 Essential (primary) hypertension: Secondary | ICD-10-CM

## 2015-10-28 DIAGNOSIS — I4891 Unspecified atrial fibrillation: Secondary | ICD-10-CM

## 2015-10-28 DIAGNOSIS — E1165 Type 2 diabetes mellitus with hyperglycemia: Secondary | ICD-10-CM | POA: Diagnosis not present

## 2015-10-28 DIAGNOSIS — I2510 Atherosclerotic heart disease of native coronary artery without angina pectoris: Secondary | ICD-10-CM

## 2015-10-28 NOTE — Assessment & Plan Note (Signed)
Currently with no symptoms of angina. No further workup at this time. Continue current medication regimen. 

## 2015-10-28 NOTE — Assessment & Plan Note (Signed)
Blood pressure is well controlled on today's visit. No changes made to the medications. 

## 2015-10-28 NOTE — Assessment & Plan Note (Signed)
We have previously recommended increasing Lasix up to 40 mg in the morning, 20 minute grams in the evening.He continues on 20 mill grams twice a day. Will continue him on this dose for now  Shortness of breath at nighttime likely secondary to sleep apnea.

## 2015-10-28 NOTE — Progress Notes (Signed)
Patient ID: [name], male    DOB: 1925-01-21, 82 y.o.   MRN: [id number]  HPI Comments: Mr. [name] is an 82 year old gentleman with history of CAD, PCI of the circumflex in 2002, catheterization in 2010 showing patent circumflex and noncritical CAD with normal right-sided pressures, ejection fraction 50-55% April 2014 with mild aortic valve stenosis, gradient 2.26 m/s, chronic lower extremity swelling that per the notes has had a history of atrial fibrillation previously on pradaxa 150 mg daily, high fall risk who presents for routine followup  Of his coronary artery disease He has a history of Parkinson's  left leg surgery performed by Dr. Ernest PineHooten.  Lives with his son Chronic leg edema bilaterally felt secondary to dependent edema, component of diastolic CHF Nonhealing ulcer left foot PAD, recent angioplasty left posterior tibial artery March 2017  In follow-up today, he reports that he continues to have slow healing ulcer Chronic leg swelling Does not like wearing compression hose, wears Ace wrap [place] optically scan showing no osteomyelitis.  Continues to have  problems at nighttime, wakes up from sleep, feels like he can't breathe. Was diagnosed with sleep apnea years ago, reports that he is unable to tolerate CPAP  Reports he is very symptomatic when he has these episodes.   Previous hemoglobin A1c 9.3., Down to 8.0 Recent diagnosis of Proteus UTI, treated with antibiotics  Unable to weigh him in clinic today EKG on today's visit shows rate 69 bpm, no significant ST or T-wave changes  Other past medical history He does not like to wear compression hose.  Legs are down most of the day, has chronic leg edema.  He does report having some shortness of breath at nighttime, wakes up ,  Symptomatic, symptoms resolve after several seconds. He reports having diagnoses of sleep apnea but does not wear CPAP.  Sits most of the day in the chair, no longer walking secondary to chronic knee  pain. in the past was bothered by frequency of urination.  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 amiodarone (PACERONE) 200 MG tablet TAKE ONE-HALF TABLET TWICE A DAY  . amoxicillin (AMOXIL) 875 MG tablet Take 1 tablet (875 mg total) by mouth 2 (two) times daily.  Marland Kitchen. aspirin EC 81 MG tablet Take 1 tablet (81 mg total) by mouth daily.  . carbidopa-levodopa (SINEMET IR) 25-100 MG per tablet Take 1 tablet by mouth 4 (four) times daily.  . Carbidopa-Levodopa ER (SINEMET CR) 25-100 MG tablet controlled release Take 1 tablet by mouth 2 (two) times daily.  . clopidogrel (PLAVIX) 75 MG tablet TAKE 1 TABLET EVERY DAY  . cyanocobalamin (,VITAMIN B-12,) 1000 MCG/ML injection Inject 1 mL (1,000 mcg total) into the muscle every 30 (thirty) days. 1 injection in 2 weeks then every 30 days  . furosemide (LASIX) 40 MG tablet Take 1 tablet (40 mg total) by mouth 2 (two) times daily.  Marland Kitchen. levothyroxine (SYNTHROID, LEVOTHROID) 175 MCG tablet 175 mcg daily.  . pantoprazole (PROTONIX) 40 MG tablet Take 1  tablet (40 mg total) by mouth daily.  . potassium chloride (K-DUR) 10 MEQ tablet TAKE ONE TABLET EVERY MORNING  . pramipexole (MIRAPEX) 1 MG tablet Take 1/2 tablet four times daily.  . sitaGLIPtin (JANUVIA) 100 MG tablet Take 1 tablet (100 mg total) by mouth daily.  . tamsulosin (FLOMAX) 0.4 MG CAPS capsule Take 1 capsule (0.4 mg total) by mouth daily after supper.   No facility-administered encounter medications on file as of 10/28/2015.     Past Medical History  Diagnosis Date  . Coronary artery disease   . Diabetes mellitus without complication (HCC)   . Aortic stenosis   . Hypertension   . Hyperlipidemia   . TIA (transient ischemic attack)   . Thyroid disease   . Hypothyroidism     Past Surgical History  Procedure Laterality Date  . Appendectomy    . Hernia repair    . Knee surgery      bilateral  . Eye surgery    . Colonoscopy    . Cardiac catheterization  2010    showed a patient circumflex stent with noncritical CAD and normal right-sided pressures.  . Coronary angioplasty  01/24/2007    stent placement to the mid circumflex   . Knee surgery Left   . Peripheral vascular catheterization N/A 08/30/2015    Procedure: Abdominal Aortogram w/Lower Extremity;  Surgeon: Renford Dills, MD;  Location: [facility];  Service: Cardiovascular;  Laterality: N/A;  . Peripheral vascular catheterization  08/30/2015    Procedure: Lower Extremity Intervention;  Surgeon: Renford Dills, MD;  Location: [facility];  Service: Cardiovascular;;    Social History  reports that he has never smoked. He has never used smokeless tobacco. He reports that he does not drink alcohol or use illicit drugs.  Family History family history includes Heart attack (age of onset: 11) in his brother; Heart attack (age of onset: 67) in his father; Heart disease in his mother.   Review of Systems  Constitutional: Negative.   Eyes: Negative.   Respiratory: Negative.   Cardiovascular: Positive for leg swelling.  Gastrointestinal: Negative.   Musculoskeletal: Positive for gait problem.  Skin: Negative.   Neurological: Negative.   Hematological: Negative.   Psychiatric/Behavioral: Negative.   All other systems reviewed and are negative.  Ht 6' (1.829 m)  Wt 190 lb (86.183 kg)  BMI 25.76 kg/m2  Physical Exam  Constitutional: He is oriented to person, place, and time. He appears well-developed and well-nourished.   Presents in a wheelchair  HENT:  Head: Normocephalic.  Nose: Nose normal.  Mouth/Throat: Oropharynx is clear and moist.  Eyes: Conjunctivae are normal. Pupils are equal, round, and reactive to light.  Neck: Normal range of motion. Neck supple. No JVD present.  Cardiovascular: Normal rate, regular rhythm, S1 normal, S2 normal and intact distal pulses.  Exam reveals no gallop and no friction rub.   Murmur heard.  Systolic murmur is present with a grade of 2/6  1+ pitting edema of the left lower extremity to below the knee, Ace wrap in place around his upper shin, knee area, loose sock in place  Pulmonary/Chest: Effort normal and breath sounds normal. No respiratory distress. He has no wheezes. He has no rales. He exhibits no tenderness.  Scant crackles at the bases bilaterally with deep inspiration  Abdominal: Soft. Bowel sounds are normal. He exhibits no distension. There is no tenderness.  Musculoskeletal: Normal range of motion. He exhibits no edema or tenderness.  Lymphadenopathy:  He has no cervical adenopathy.  Neurological: He is alert and oriented to person, place, and time. Coordination normal.  Skin: Skin is warm and dry. No rash noted. No erythema.  Psychiatric: He has a normal mood and affect. His behavior is normal. Judgment and thought content normal.      Assessment and Plan   Nursing note and vitals reviewed.

## 2015-10-28 NOTE — Assessment & Plan Note (Signed)
Unable to exercise. Recommended close follow-up with primary care for aggressive diabetes control

## 2015-10-28 NOTE — Assessment & Plan Note (Signed)
Recent angioplasty of left posterior tibial artery by Dr. Lorretta HarpSchneir Nonhealing soft tissue ulceration left foot We have stressed importance of aggressive diabetes control

## 2015-10-28 NOTE — Assessment & Plan Note (Signed)
He does not want a statin Cholesterol is actually impressive given he is not on any medication   Total encounter time more than 25 minutes  Greater than 50% was spent in counseling and coordination of care with the patient

## 2015-10-28 NOTE — Assessment & Plan Note (Signed)
Maintaining normal sinus rhythm  recommended he stay on his amiodarone

## 2015-10-28 NOTE — Patient Instructions (Addendum)
You are doing well. No medication changes were made.  Ace wrap over the foot and bandage  Please call us if you have new issues that need to be addressed before your next appt.  Your physician wants you to follow-up in: 6 months.  You will receive a reminder letter in the mail two months in advance. If you don't receive a letter, please call our office to schedule the follow-up appointment.

## 2015-11-01 ENCOUNTER — Other Ambulatory Visit
Admission: RE | Admit: 2015-11-01 | Discharge: 2015-11-01 | Disposition: A | Discharge status: Home / Self Care | Payer: PPO | Source: Ambulatory Visit | Attending: Internal Medicine | Admitting: Internal Medicine

## 2015-11-01 ENCOUNTER — Encounter: Payer: PPO | Admitting: Internal Medicine

## 2015-11-01 DIAGNOSIS — L97522 Non-pressure chronic ulcer of other part of left foot with fat layer exposed: Secondary | ICD-10-CM | POA: Diagnosis not present

## 2015-11-01 DIAGNOSIS — L089 Local infection of the skin and subcutaneous tissue, unspecified: Secondary | ICD-10-CM | POA: Diagnosis present

## 2015-11-03 NOTE — Progress Notes (Signed)
SHYHIEM, BEENEY ([id number]) Visit Report for 11/01/2015 Chief Complaint Document Details Patient Name: [name], [name]. Date of Service: 11/01/2015 12:45 PM Medical Record Patient Account Number: [id number] Number: Treating RN: Baruch Gouty, RN, BSN, Rita Aug 27, 1924 [phone] y.o. Other Clinician: Date of Birth/Sex: Male) Treating Peggie Hornak Primary Care Physician: Golden Pop Physician/Extender: G Referring Physician: Golden Pop Weeks in Treatment: 14 Information Obtained from: Patient Chief Complaint Has had swelling of both lower extremities with some ulceration on the right lower extremity. 07/20/15 patient returns today predominantly for a painful wound on the plantar aspect of his left foot of recent onset. Electronic Signature(s) Signed: 11/02/2015 5:25:36 PM By: [name] MD Entered By: [name] on 11/01/2015 14:02:17 [place], Junie Spencer ([id number]) -------------------------------------------------------------------------------- Debridement Details Patient Name: Chris Bottoms K. Date of Service: 11/01/2015 12:45 PM Medical Record Patient Account Number: [id number] Number: Treating RN: Baruch Gouty, RN, BSN, Rita May 20, 1925 [phone] y.o. Other Clinician: Date of Birth/Sex: Male) Treating Ramonda Galyon, [place] Primary Care Physician: Jeananne Rama, [place]: G Referring Physician: Golden Pop Weeks in Treatment: 14 Debridement Performed for Wound #5 Left Calcaneus Assessment: Performed By: Physician [name], MD Debridement: Debridement Pre-procedure Yes Verification/Time Out Taken: Start Time: 13:12 Pain Control: Lidocaine 4% Topical Solution Level: Skin/Subcutaneous Tissue Total Area Debrided (L x 1 (cm) x 1.5 (cm) = 1.5 (cm) W): Tissue and other Non-Viable, Fibrin/Slough, Subcutaneous material debrided: Instrument: Curette Bleeding: Minimum Hemostasis Achieved: Pressure End Time: 13:15 Procedural Pain: 0 Post  Procedural Pain: 0 Response to Treatment: Procedure was tolerated well Post Debridement Measurements of Total Wound Length: (cm) 1 Width: (cm) 1.5 Depth: (cm) 0.2 Volume: (cm) 0.236 Post Procedure Diagnosis Same as Pre-procedure Electronic Signature(s) Signed: 11/01/2015 3:28:31 PM By: Regan Lemming BSN, RN Signed: 11/02/2015 5:25:36 PM By: [name] MD Entered By: [name] on 11/01/2015 14:01:18 Silbernagel, Junie Spencer ([id number]) -------------------------------------------------------------------------------- Debridement Details Patient Name: Chris Bottoms K. Date of Service: 11/01/2015 12:45 PM Medical Record Patient Account Number: [id number] Number: Treating RN: Baruch Gouty, RN, BSN, Rita [birth year] [phone] y.o. Other Clinician: Date of Birth/Sex: Male) Treating Kalin Amrhein, [place] Primary Care Physician: Jeananne Rama, [place]: G Referring Physician: Golden Pop Weeks in Treatment: 14 Debridement Performed for Wound #6R Left,Plantar Foot Assessment: Performed By: Physician [name], MD Debridement: Debridement Pre-procedure Yes Verification/Time Out Taken: Start Time: 13:05 Pain Control: Lidocaine 4% Topical Solution Level: Skin/Subcutaneous Tissue Total Area Debrided (L x 0.5 (cm) x 0.7 (cm) = 0.35 (cm) W): Tissue and other Non-Viable, Fibrin/Slough, Subcutaneous material debrided: Instrument: Curette Bleeding: Minimum Hemostasis Achieved: Pressure End Time: 13:07 Procedural Pain: 0 Post Procedural Pain: 0 Response to Treatment: Procedure was tolerated well Post Debridement Measurements of Total Wound Length: (cm) 0.5 Width: (cm) 0.7 Depth: (cm) 0.1 Volume: (cm) 0.027 Post Procedure Diagnosis Same as Pre-procedure Electronic Signature(s) Signed: 11/01/2015 3:28:31 PM By: Regan Lemming BSN, RN Signed: 11/02/2015 5:25:36 PM By: [name] MD Entered By: [name] on 11/01/2015 14:01:35 [place]oso, Junie Spencer  ([id number]) -------------------------------------------------------------------------------- Debridement Details Patient Name: Chris Bottoms K. Date of Service: 11/01/2015 12:45 PM Medical Record Patient Account Number: [id number] Number: Treating RN: Baruch Gouty, RN, BSN, Rita 05/13/1925 [phone] y.o. Other Clinician: Date of Birth/Sex: Male) Treating Angus Amini Primary Care Physician: Golden Pop Physician/Extender: G Referring Physician: Golden Pop Weeks in Treatment: 14 Debridement Performed for Wound #7 Left Metatarsal head first Assessment: Performed By: Physician [name], MD Debridement: Debridement Pre-procedure Yes Verification/Time Out Taken: Start Time: 13:07 Pain Control: Lidocaine 4% Topical Solution Level: Skin/Subcutaneous Tissue Total Area Debrided (  L x 3.5 (cm) x 3.5 (cm) = 12.25 (cm) W): Tissue and other Non-Viable, Fibrin/Slough, Subcutaneous material debrided: Instrument: Curette, Forceps, Scissors Bleeding: Minimum Hemostasis Achieved: Pressure End Time: 13:11 Procedural Pain: 0 Post Procedural Pain: 0 Response to Treatment: Procedure was tolerated well Post Debridement Measurements of Total Wound Length: (cm) 3.5 Width: (cm) 3.5 Depth: (cm) 0.4 Volume: (cm) 3.848 Post Procedure Diagnosis Same as Pre-procedure Electronic Signature(s) Signed: 11/01/2015 3:28:31 PM By: Regan Lemming BSN, RN Signed: 11/02/2015 5:25:36 PM By: [name] MD Entered By: [name] on 11/01/2015 14:02:00 [place], Kelven K. ([id number]) -------------------------------------------------------------------------------- HPI Details Patient Name: Chris Bottoms K. Date of Service: 11/01/2015 12:45 PM Medical Record Patient Account Number: [id number] Number: Treating RN: Baruch Gouty, RN, BSN, Rita 05-21-1925 [phone] y.o. Other Clinician: Date of Birth/Sex: Male) Treating [name] Primary Care Physician: Golden Pop Physician/Extender: G Referring Physician: Golden Pop Weeks in Treatment: 14 History of Present Illness Location: had swelling of the right lower extremity with some ulceration on the medial part of his calf and on the right fifth toe Quality: Patient reports No Pain. Severity: Patient states wound (s) are getting better. Duration: Patient has had the wound for < 2 weeks prior to presenting for treatment Context: The wound appeared gradually over time. nail on his right fifth toe came off loose and he pried it Modifying Factors: Other treatment(s) tried include:vascular workup where he is going to be seeing Dr. Lucky Cowboy on Monday Associated Signs and Symptoms: Patient reports presence of swelling HPI Description: Very pleasant 82 year old with a past medical history significant for type 2 diabetes. He underwent left knee surgery in February 2015. He had an angioplasty of his R posterior tibial on 10/30/13. Vascular workup done in April of this year showed an ABI on the right to be noncompressible and the left to be 0.86. About a week ago he noticed some swelling on the right lower extremity and then an ulceration on the medial part of his calf. He also noted that the right fifth nail bed had a loose nail and he pried it open and now has an open wound. He has no evidence of cellulitis. he was seen by the nurse practitioner at his PCPs office and put on Bactroban ointment and doxycycline for 10 days. 01/21/2015 -- he was seen by Dr. Ronalee Belts and though we do not have the note, we know he had used an Unna's boot and asked him to come to see as again for a compression bandage. The patient has no fresh complaints. 07/20/15; the patient returns today predominantly for her wounds on his left plantar foot. He was seen here in the summer last on 8/19 for wounds on his right medial calf and right fifth nail bed. I don't know that he actually returned to be discharged, he is wearing compression on  bilateral legs. He has known peripheral arterial disease in the setting of type 2 diabetes having an angioplasty on the right posterior tibial artery on 10/30/13. His ABI calculated in the clinic today was 0.97 on the right. I don't think the left could be done. The patient tells me that roughly a week ago he started to develop pain in the plantar aspect of his left foot. I think his caregiver noticed the wounds and he is here for our review events. He is minimally ambulatory, spends most of his time in a wheelchair. I am not certain of the issue here. 07/27/15; the patient is here for 3 wounds. The first over his  left first met head, second over the left medial heel and the third over the left fourth metatarsal head. Culture from the fourth metatarsal head last week showed Klebsiella. This would not be sensitive to the doxycycline possibly I have therefore changed him to Keflex to which the Klebsiella should be sensitive 08/10/15; patient returns today. He was not seen last week. He does have home health. The area over the left fourth metatarsal head is almost completely closed the area over the medial aspect of his left first metatarsal head and the left medial heel still required debridement Wolken, Alen K. ([id number]) 08/17/15 the area over the fourth metatarsal head has resolved. The area on his left first metatarsal head lateral aspect and the left medial heel still require surgical debridement although the surface is beginning to look a bit better. Is followed with vascular surgery and is apparently being planned for an angiogram and possible stent on 3/28 08/23/15 the patient complained of pain over his fourth metatarsal head, the previously closed wound. Physical exam did not really show a source he had a small amount of callus which I lightly removed to expose a large amount of purulent drainage. The area was then fully debridement the area on the first metatarsal head lateral aspect and  left medial heel appear improved [Iodosorb] 08/31/15; the patient is completed a well-tolerated angioplasty of the left posterior tibial artery. The left common femoral profunda femoris and superficial femoral arteries are diffusely diseased but there was no hemodynamically significant lesion. Culture I did last week from the left fourth metatarsal head enterococcus I'm going to give him another week of Keflex which should cover this. Patient states he had some pain but it seems to have resolved in the sole of his foot 09/07/15 he has completed his Keflex yesterday. 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 Using Iodoflex. He is completed antibiotics. 09/21/15; MRI next week, we will see him in 2 weeks. 10/05/15; miraculously this patient's MRI did not show osteomyelitis in his left foot in spite of the very deep wound on the lateral aspect of his first metatarsal phalangeal, and a very deep reoccurring wound over the plantar aspect of his left fourth metatarsal head there is no osteomyelitis!!! He did call in yesterday to report marked swelling in the left leg with some tenderness in his knee. He apparently saw his primary physician who gave him antibiotics for a UTI although I'm not exactly sure what these antibiotics were. He has been using it Iodoflex 10/19/15; the patient was not here last week. He is having Iodoflex applied at home and changed by home health. The duplex ultrasound of the left leg I ordered 2 weeks ago due to increased swelling and tenderness does not seem to have been done. 11/01/15; the patient was not here last week due to transportation issues. He has been using Iodoflex at home being changed by home health Electronic Signature(s) Signed: 11/02/2015 5:25:36 PM By: [name] MD Entered By: [name] on 11/01/2015 14:02:58 Ty Hilts  ([id number]) -------------------------------------------------------------------------------- Physical Exam Details Patient Name: Chris Bottoms K. Date of Service: 11/01/2015 12:45 PM Medical Record Patient Account Number: [id number] Number: Treating RN: Baruch Gouty, RN, BSN, Rita Apr 15, 1925 [phone] y.o. Other Clinician: Date of Birth/Sex: Male) Treating Dellia Nims Jamal Haskin Primary Care Physician: Golden Pop Physician/Extender: G Referring Physician: Golden Pop Weeks in Treatment:  14 Notes Wound exam; the area on the left fourth metatarsal head plantar aspect actually looks somewhat better. The area on the lateral aspect of his first metatarsal head is a deep wound with multiple areas of exposed bone. It might be possible to obtain part of this for culture although the MRI did not suggest underlying chronic osteomyelitis. The area over the lateral aspect of his calcaneus had blackened eschar. This debridement it was some difficulty, culture done of this area Electronic Signature(s) Signed: 11/02/2015 5:25:36 PM By: [name] MD Entered By: [name] on 11/01/2015 14:04:07 Ty Hilts ([id number]) -------------------------------------------------------------------------------- Physician Orders Details Patient Name: Chris Bottoms K. Date of Service: 11/01/2015 12:45 PM Medical Record Patient Account Number: [id number] Number: Treating RN: Baruch Gouty, RN, BSN, Rita [birth year] [phone] y.o. Other Clinician: Date of Birth/Sex: Male) Treating Delford Wingert, [place] Primary Care Physician: Golden Pop Physician/Extender: G Referring Physician: Golden Pop Weeks in Treatment: 29 Verbal / Phone Orders: Yes Clinician: Afful, RN, BSN, Rita Read Back and Verified: Yes Diagnosis Coding Wound Cleansing Wound #5 Left Calcaneus o Cleanse wound with mild soap and water Wound #6R Left,Plantar Foot o Cleanse wound with mild soap and water Wound #7 Left Metatarsal head  first o Cleanse wound with mild soap and water Anesthetic Wound #5 Left Calcaneus o Topical Lidocaine 4% cream applied to wound bed prior to debridement Wound #6R Left,Plantar Foot o Topical Lidocaine 4% cream applied to wound bed prior to debridement Wound #7 Left Metatarsal head first o Topical Lidocaine 4% cream applied to wound bed prior to debridement Primary Wound Dressing Wound #5 Left Calcaneus o Iodoflex Wound #6R Left,Plantar Foot o Iodoflex Wound #7 Left Metatarsal head first o Iodoflex Secondary Dressing Wound #5 Left Calcaneus o Gauze and Kerlix/Conform o Foam Mates, Susumu K. ([id number]) Wound #6R Left,Plantar Foot o Gauze and Kerlix/Conform Wound #7 Left Metatarsal head first o Gauze and Kerlix/Conform Dressing Change Frequency Wound #5 Left Calcaneus o Change dressing every other day. Wound #6R Left,Plantar Foot o Change dressing every other day. Wound #7 Left Metatarsal head first o Change dressing every other day. Follow-up Appointments Wound #5 Left Calcaneus o Return Appointment in 1 week. Wound #6R Left,Plantar Foot o Return Appointment in 1 week. Wound #7 Left Metatarsal head first o Return Appointment in 1 week. Off-Loading Wound #5 Left Calcaneus o Turn and reposition every 2 hours Wound #6R Left,Plantar Foot o Turn and reposition every 2 hours Wound #7 Left Metatarsal head first o Turn and reposition every 2 hours Additional Orders / Instructions Wound #5 Left Calcaneus o Increase protein intake. o Activity as tolerated Wound #6R Left,Plantar Foot o Increase protein intake. o Activity as tolerated Wound #7 Left Metatarsal head first o Increase protein intake. MALIIK, KARNER ([id number]) o Activity as tolerated Home Health Wound #5 Left Calcaneus o Continue Home Health Visits - [place] Nurse may visit PRN to address patientos wound care needs. o FACE TO FACE  ENCOUNTER: MEDICARE and MEDICAID PATIENTS: I certify that this patient is under my care and that I had a face-to-face encounter that meets the physician face-to-face encounter requirements with this patient on this date. The encounter with the patient was in whole or in part for the following MEDICAL CONDITION: (primary reason for [place]) MEDICAL NECESSITY: I certify, that based on my findings, NURSING services are a medically necessary home health service. HOME BOUND STATUS: I certify that my clinical findings support that this patient is homebound (i.e., Due to illness or  injury, pt requires aid of supportive devices such as crutches, cane, wheelchairs, walkers, the use of special transportation or the assistance of another person to leave their place of residence. There is a normal inability to leave the home and doing so requires considerable and taxing effort. Other absences are for medical reasons / religious services and are infrequent or of short duration when for other reasons). o If current dressing causes regression in wound condition, may D/C ordered dressing product/s and apply Normal Saline Moist Dressing daily until next [place] / Other MD appointment. [place] of regression in wound condition at [phone]. o Please direct any NON-WOUND related issues/requests for orders to patient's Primary Care Physician Wound #6R [place] Nurse may visit PRN to address patientos wound care needs. o FACE TO FACE ENCOUNTER: MEDICARE and MEDICAID PATIENTS: I certify that this patient is under my care and that I had a face-to-face encounter that meets the physician face-to-face encounter requirements with this patient on this date. The encounter with the patient was in whole or in part for the following MEDICAL CONDITION: (primary reason for [place]) MEDICAL NECESSITY: I certify,  that based on my findings, NURSING services are a medically necessary home health service. HOME BOUND STATUS: I certify that my clinical findings support that this patient is homebound (i.e., Due to illness or injury, pt requires aid of supportive devices such as crutches, cane, wheelchairs, walkers, the use of special transportation or the assistance of another person to leave their place of residence. There is a normal inability to leave the home and doing so requires considerable and taxing effort. Other absences are for medical reasons / religious services and are infrequent or of short duration when for other reasons). o If current dressing causes regression in wound condition, may D/C ordered dressing product/s and apply Normal Saline Moist Dressing daily until next [place] / Other MD appointment. [place] of regression in wound condition at [phone]. o Please direct any NON-WOUND related issues/requests for orders to patient's Primary Care Physician Wound #7 Left Metatarsal head first o [place] Nurse may visit PRN to address patientos wound care needs. DONALDO, T[place]GARDEN ([id number]) o FACE TO FACE ENCOUNTER: MEDICARE and MEDICAID PATIENTS: I certify that this patient is under my care and that I had a face-to-face encounter that meets the physician face-to-face encounter requirements with this patient on this date. The encounter with the patient was in whole or in part for the following MEDICAL CONDITION: (primary reason for [place]) MEDICAL NECESSITY: I certify, that based on my findings, NURSING services are a medically necessary home health service. HOME BOUND STATUS: I certify that my clinical findings support that this patient is homebound (i.e., Due to illness or injury, pt requires aid of supportive devices such as crutches, cane, wheelchairs, walkers, the use of special transportation or the  assistance of another person to leave their place of residence. There is a normal inability to leave the home and doing so requires considerable and taxing effort. Other absences are for medical reasons / religious services and are infrequent or of short duration when for other reasons). o If current dressing causes regression in wound condition, may D/C ordered dressing product/s and apply Normal Saline Moist Dressing daily until next [place] / Other MD appointment. [place] of regression in wound condition at [phone]. o Please  direct any NON-WOUND related issues/requests for orders to patient's Primary Care Physician Laboratory o Bacteria identified in Wound by Culture (MICRO) - left heel oooo LOINC Code: 0160-1 oooo Convenience Name: Wound culture routine Electronic Signature(s) Signed: 11/01/2015 3:28:31 PM By: Regan Lemming BSN, RN Signed: 11/02/2015 5:25:36 PM By: [name] MD Entered By: Regan Lemming on 11/01/2015 13:21:39 Hodes, Junie Spencer ([id number]) -------------------------------------------------------------------------------- Problem List Details Patient Name: Chris Bottoms K. Date of Service: 11/01/2015 12:45 PM Medical Record Patient Account Number: [id number] Number: Treating RN: Baruch Gouty, RN, BSN, Rita 14-Jan-1925 [phone] y.o. Other Clinician: Date of Birth/Sex: Male) Treating Kayren Holck Primary Care Physician: Golden Pop Physician/Extender: G Referring Physician: Golden Pop Weeks in Treatment: 14 Active Problems ICD-10 Encounter Code Description Active Date Diagnosis L97.522 Non-pressure chronic ulcer of other part of left foot with fat 07/20/2015 Yes layer exposed L97.511 Non-pressure chronic ulcer of other part of right foot 07/20/2015 Yes limited to breakdown of skin E11.621 Type 2 diabetes mellitus with foot ulcer 07/20/2015 Yes E11.51 Type 2 diabetes mellitus with diabetic peripheral 07/20/2015  Yes angiopathy without gangrene Inactive Problems Resolved Problems Electronic Signature(s) Signed: 11/02/2015 5:25:36 PM By: [name] MD Entered By: [name] on 11/01/2015 14:00:00 [place], Junie Spencer ([id number]) -------------------------------------------------------------------------------- Progress Note Details Patient Name: Chris Bottoms K. Date of Service: 11/01/2015 12:45 PM Medical Record Patient Account Number: [id number] Number: Treating RN: Baruch Gouty, RN, BSN, Rita 1924/09/16 [phone] y.o. Other Clinician: Date of Birth/Sex: Male) Treating Ulrick Methot Primary Care Physician: Golden Pop Physician/Extender: G Referring Physician: Golden Pop Weeks in Treatment: 14 Subjective Chief Complaint Information obtained from Patient Has had swelling of both lower extremities with some ulceration on the right lower extremity. 07/20/15 patient returns today predominantly for a painful wound on the plantar aspect of his left foot of recent onset. History of Present Illness (HPI) The following HPI elements were documented for the patient's wound: Location: had swelling of the right lower extremity with some ulceration on the medial part of his calf and on the right fifth toe Quality: Patient reports No Pain. Severity: Patient states wound (s) are getting better. Duration: Patient has had the wound for < 2 weeks prior to presenting for treatment Context: The wound appeared gradually over time. nail on his right fifth toe came off loose and he pried it Modifying Factors: Other treatment(s) tried include:vascular workup where he is going to be seeing Dr. Lucky Cowboy on Monday Associated Signs and Symptoms: Patient reports presence of swelling Very pleasant 82 year old with a past medical history significant for type 2 diabetes. He underwent left knee surgery in February 2015. He had an angioplasty of his R posterior tibial on 10/30/13. Vascular workup done in April of  this year showed an ABI on the right to be noncompressible and the left to be 0.86. About a week ago he noticed some swelling on the right lower extremity and then an ulceration on the medial part of his calf. He also noted that the right fifth nail bed had a loose nail and he pried it open and now has an open wound. He has no evidence of cellulitis. he was seen by the nurse practitioner at his PCPs office and put on Bactroban ointment and doxycycline for 10 days. 01/21/2015 -- he was seen by Dr. Ronalee Belts and though we do not have the note, we know he had used an Unna's boot and asked him to come to see as again for a compression bandage. The patient has no fresh complaints. 07/20/15; the patient  returns today predominantly for her wounds on his left plantar foot. He was seen here in the summer last on 8/19 for wounds on his right medial calf and right fifth nail bed. I don't know that he actually returned to be discharged, he is wearing compression on bilateral legs. He has known peripheral arterial disease in the setting of type 2 diabetes having an angioplasty on the right posterior tibial artery on 10/30/13. His ABI calculated in the clinic today was 0.97 on the right. I don't think the left could be done. The patient tells me that roughly a week ago he started to develop pain in the plantar aspect of his left foot. I Bochicchio, Weslee K. ([id number]) think his caregiver noticed the wounds and he is here for our review events. He is minimally ambulatory, spends most of his time in a wheelchair. I am not certain of the issue here. 07/27/15; the patient is here for 3 wounds. The first over his left first met head, second over the left medial heel and the third over the left fourth metatarsal head. Culture from the fourth metatarsal head last week showed Klebsiella. This would not be sensitive to the doxycycline possibly I have therefore changed him to Keflex to which the Klebsiella should be  sensitive 08/10/15; patient returns today. He was not seen last week. He does have home health. The area over the left fourth metatarsal head is almost completely closed the area over the medial aspect of his left first metatarsal head and the left medial heel still required debridement 08/17/15 the area over the fourth metatarsal head has resolved. The area on his left first metatarsal head lateral aspect and the left medial heel still require surgical debridement although the surface is beginning to look a bit better. Is followed with vascular surgery and is apparently being planned for an angiogram and possible stent on 3/28 08/23/15 the patient complained of pain over his fourth metatarsal head, the previously closed wound. Physical exam did not really show a source he had a small amount of callus which I lightly removed to expose a large amount of purulent drainage. The area was then fully debridement the area on the first metatarsal head lateral aspect and left medial heel appear improved [Iodosorb] 08/31/15; the patient is completed a well-tolerated angioplasty of the left posterior tibial artery. The left common femoral profunda femoris and superficial femoral arteries are diffusely diseased but there was no hemodynamically significant lesion. Culture I did last week from the left fourth metatarsal head enterococcus I'm going to give him another week of Keflex which should cover this. Patient states he had some pain but it seems to have resolved in the sole of his foot 09/07/15 he has completed his Keflex yesterday. 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 Using Iodoflex. He is completed antibiotics. 09/21/15; MRI next week, we will see him in 2 weeks. 10/05/15; miraculously this patient's MRI did not show osteomyelitis in his left foot in spite of the very deep wound on the lateral  aspect of his first metatarsal phalangeal, and a very deep reoccurring wound over the plantar aspect of his left fourth metatarsal head there is no osteomyelitis!!! He did call in yesterday to report marked swelling in the left leg with some tenderness in his knee. He apparently saw his primary physician who gave him antibiotics  for a UTI although I'm not exactly sure what these antibiotics were. He has been using it Iodoflex 10/19/15; the patient was not here last week. He is having Iodoflex applied at home and changed by home health. The duplex ultrasound of the left leg I ordered 2 weeks ago due to increased swelling and tenderness does not seem to have been done. 11/01/15; the patient was not here last week due to transportation issues. He has been using Iodoflex at home being changed by home health Objective Constitutional Vitals Time Taken: 12:51 PM, Height: 72 in, Weight: 195 lbs, BMI: 26.4, Temperature: 97.1 F, Pulse: 71 bpm, Respiratory Rate: 18 breaths/min, Blood Pressure: 134/60 mmHg. BRELAN, HANNEN K. ([id number]) Integumentary (Hair, Skin) Wound #5 status is Open. Original cause of wound was Blister. The wound is located on the Left Calcaneus. The wound measures 1cm length x 1.5cm width x 0.2cm depth; 1.178cm^2 area and 0.236cm^3 volume. The wound is limited to skin breakdown. There is a large amount of serosanguineous drainage noted. The wound margin is distinct with the outline attached to the wound base. There is no granulation within the wound bed. There is a large (67-100%) amount of necrotic tissue within the wound bed including Eschar. The periwound skin appearance exhibited: Localized Edema, Dry/Scaly, Moist. The periwound skin appearance did not exhibit: Callus, Crepitus, Excoriation, Fluctuance, Friable, Induration, Rash, Scarring, Maceration, Atrophie Blanche, Cyanosis, Ecchymosis, Hemosiderin Staining, Mottled, Pallor, Rubor, Erythema. Periwound temperature was noted  as No Abnormality. The periwound has tenderness on palpation. Wound #6R status is Open. Original cause of wound was Gradually Appeared. The wound is located on the [place]. The wound measures 0.5cm length x 0.7cm width x 0.1cm depth; 0.275cm^2 area and 0.027cm^3 volume. The wound is limited to skin breakdown. There is no tunneling or undermining noted. There is a large amount of serous drainage noted. The wound margin is distinct with the outline attached to the wound base. There is large (67-100%) pink granulation within the wound bed. There is a small (1-33%) amount of necrotic tissue within the wound bed including Adherent Slough. The periwound skin appearance exhibited: Callus, Localized Edema, Moist. The periwound skin appearance did not exhibit: Crepitus, Excoriation, Fluctuance, Friable, Induration, Rash, Scarring, Dry/Scaly, Maceration, Atrophie Blanche, Cyanosis, Ecchymosis, Hemosiderin Staining, Mottled, Pallor, Rubor, Erythema. Periwound temperature was noted as No Abnormality. The periwound has tenderness on palpation. Wound #7 status is Open. Original cause of wound was Gradually Appeared. The wound is located on the Left Metatarsal head first. The wound measures 3.5cm length x 3.5cm width x 0.2cm depth; 9.621cm^2 area and 1.924cm^3 volume. The wound is limited to skin breakdown. There is no tunneling or undermining noted. There is a large amount of serosanguineous drainage noted. The wound margin is distinct with the outline attached to the wound base. There is small (1-33%) pink granulation within the wound bed. There is a large (67-100%) amount of necrotic tissue within the wound bed including Adherent Slough. The periwound skin appearance exhibited: Callus, Localized Edema, Maceration, Moist. The periwound skin appearance did not exhibit: Crepitus, Excoriation, Fluctuance, Friable, Induration, Rash, Scarring, Dry/Scaly, Atrophie Blanche, Cyanosis, Ecchymosis,  Hemosiderin Staining, Mottled, Pallor, Rubor, Erythema. Periwound temperature was noted as No Abnormality. The periwound has tenderness on palpation. Assessment Active Problems ICD-10 L97.522 - Non-pressure chronic ulcer of other part of left foot with fat layer exposed L97.511 - Non-pressure chronic ulcer of other part of right foot limited to breakdown of skin E11.621 - Type 2 diabetes mellitus with foot ulcer E11.51 -  Type 2 diabetes mellitus with diabetic peripheral angiopathy without gangrene Verga, Rigoberto K. ([id number]) Procedures Wound #5 Wound #5 is a Diabetic Wound/Ulcer of the Lower Extremity located on the Left Calcaneus . There was a Skin/Subcutaneous Tissue Debridement (34196-22297) debridement with total area of 1.5 sq cm performed by [name], MD. with the following instrument(s): Curette to remove Non-Viable tissue/material including Fibrin/Slough and Subcutaneous after achieving pain control using Lidocaine 4% Topical Solution. A time out was conducted prior to the start of the procedure. A Minimum amount of bleeding was controlled with Pressure. The procedure was tolerated well with a pain level of 0 throughout and a pain level of 0 following the procedure. Post Debridement Measurements: 1cm length x 1.5cm width x 0.2cm depth; 0.236cm^3 volume. Post procedure Diagnosis Wound #5: Same as Pre-Procedure Wound #6R Wound #6R is a Diabetic Wound/Ulcer of the Lower Extremity located on the Left,Plantar Foot . There was a Skin/Subcutaneous Tissue Debridement ([id number]) debridement with total area of 0.35 sq cm performed by [name], MD. with the following instrument(s): Curette to remove Non-Viable tissue/material including Fibrin/Slough and Subcutaneous after achieving pain control using Lidocaine 4% Topical Solution. A time out was conducted prior to the start of the procedure. A Minimum amount of bleeding was controlled with Pressure. The procedure was  tolerated well with a pain level of 0 throughout and a pain level of 0 following the procedure. Post Debridement Measurements: 0.5cm length x 0.7cm width x 0.1cm depth; 0.027cm^3 volume. Post procedure Diagnosis Wound #6R: Same as Pre-Procedure Wound #7 Wound #7 is a Diabetic Wound/Ulcer of the Lower Extremity located on the Left Metatarsal head first . There was a Skin/Subcutaneous Tissue Debridement ([id number]) debridement with total area of 12.25 sq cm performed by [name], MD. with the following instrument(s): Curette, Forceps, and Scissors to remove Non-Viable tissue/material including Fibrin/Slough and Subcutaneous after achieving pain control using Lidocaine 4% Topical Solution. A time out was conducted prior to the start of the procedure. A Minimum amount of bleeding was controlled with Pressure. The procedure was tolerated well with a pain level of 0 throughout and a pain level of 0 following the procedure. Post Debridement Measurements: 3.5cm length x 3.5cm width x 0.4cm depth; 3.848cm^3 volume. Post procedure Diagnosis Wound #7: Same as Pre-Procedure Plan Wound Cleansing: Wound #5 Left Calcaneus: Cleanse wound with mild soap and water Folmer, Jagjit K. ([id number]) Wound #6R Left,Plantar Foot: Cleanse wound with mild soap and water Wound #7 Left Metatarsal head first: Cleanse wound with mild soap and water Anesthetic: Wound #5 Left Calcaneus: Topical Lidocaine 4% cream applied to wound bed prior to debridement Wound #6R Left,Plantar Foot: Topical Lidocaine 4% cream applied to wound bed prior to debridement Wound #7 Left Metatarsal head first: Topical Lidocaine 4% cream applied to wound bed prior to debridement Primary Wound Dressing: Wound #5 Left Calcaneus: Iodoflex Wound #6R Left,Plantar Foot: Iodoflex Wound #7 Left Metatarsal head first: Iodoflex Secondary Dressing: Wound #5 Left Calcaneus: Gauze and Kerlix/Conform Foam Wound #6R Left,Plantar  Foot: Gauze and Kerlix/Conform Wound #7 Left Metatarsal head first: Gauze and Kerlix/Conform Dressing Change Frequency: Wound #5 Left Calcaneus: Change dressing every other day. Wound #6R Left,Plantar Foot: Change dressing every other day. Wound #7 Left Metatarsal head first: Change dressing every other day. Follow-up Appointments: Wound #5 Left Calcaneus: Return Appointment in 1 week. Wound #6R Left,Plantar Foot: Return Appointment in 1 week. Wound #7 Left Metatarsal head first: Return Appointment in 1 week. Off-Loading: Wound #5 Left Calcaneus: Turn and  reposition every 2 hours Wound #6R Left,Plantar Foot: Turn and reposition every 2 hours Wound #7 Left Metatarsal head first: Turn and reposition every 2 hours Additional Orders / Instructions: Wound #5 Left Calcaneus: Increase protein intake. Activity as tolerated Mcdougle, Timoteo K. ([id number]) Wound #6R Left,Plantar Foot: Increase protein intake. Activity as tolerated Wound #7 Left Metatarsal head first: Increase protein intake. Activity as tolerated Home Health: Wound #5 Left Calcaneus: Continue Home Health Visits - [place] Nurse may visit PRN to address patient s wound care needs. FACE TO FACE ENCOUNTER: MEDICARE and MEDICAID PATIENTS: I certify that this patient is under my care and that I had a face-to-face encounter that meets the physician face-to-face encounter requirements with this patient on this date. The encounter with the patient was in whole or in part for the following MEDICAL CONDITION: (primary reason for [place]) MEDICAL NECESSITY: I certify, that based on my findings, NURSING services are a medically necessary home health service. HOME BOUND STATUS: I certify that my clinical findings support that this patient is homebound (i.e., Due to illness or injury, pt requires aid of supportive devices such as crutches, cane, wheelchairs, walkers, the use of special transportation or the  assistance of another person to leave their place of residence. There is a normal inability to leave the home and doing so requires considerable and taxing effort. Other absences are for medical reasons / religious services and are infrequent or of short duration when for other reasons). If current dressing causes regression in wound condition, may D/C ordered dressing product/s and apply Normal Saline Moist Dressing daily until next [place] / Other MD appointment. [place] of regression in wound condition at [phone]. Please direct any NON-WOUND related issues/requests for orders to patient's Primary Care Physician Wound #6R Left,Plantar Foot: [place] Nurse may visit PRN to address patient s wound care needs. FACE TO FACE ENCOUNTER: MEDICARE and MEDICAID PATIENTS: I certify that this patient is under my care and that I had a face-to-face encounter that meets the physician face-to-face encounter requirements with this patient on this date. The encounter with the patient was in whole or in part for the following MEDICAL CONDITION: (primary reason for [place]) MEDICAL NECESSITY: I certify, that based on my findings, NURSING services are a medically necessary home health service. HOME BOUND STATUS: I certify that my clinical findings support that this patient is homebound (i.e., Due to illness or injury, pt requires aid of supportive devices such as crutches, cane, wheelchairs, walkers, the use of special transportation or the assistance of another person to leave their place of residence. There is a normal inability to leave the home and doing so requires considerable and taxing effort. Other absences are for medical reasons / religious services and are infrequent or of short duration when for other reasons). If current dressing causes regression in wound condition, may D/C ordered dressing product/s and apply Normal  Saline Moist Dressing daily until next [place] / Other MD appointment. [place] of regression in wound condition at [phone]. Please direct any NON-WOUND related issues/requests for orders to patient's Primary Care Physician Wound #7 Left Metatarsal head first: [place] Nurse may visit PRN to address patient s wound care needs. FACE TO FACE ENCOUNTER: MEDICARE and MEDICAID PATIENTS: I certify that this patient is under my care and that I had a face-to-face encounter that meets the physician face-to-face encounter requirements  with this patient on this date. The encounter with the patient was in whole or in part for the following MEDICAL CONDITION: (primary reason for [place]) MEDICAL NECESSITY: I certify, that based on my findings, NURSING services are a medically necessary home health service. HOME BOUND STATUS: I certify that my clinical findings support that this patient is homebound (i.e., Due to illness or injury, pt requires aid of supportive devices such as crutches, cane, wheelchairs, walkers, the use Sublett, Drevin K. ([id number]) of special transportation or the assistance of another person to leave their place of residence. There is a normal inability to leave the home and doing so requires considerable and taxing effort. Other absences are for medical reasons / religious services and are infrequent or of short duration when for other reasons). If current dressing causes regression in wound condition, may D/C ordered dressing product/s and apply Normal Saline Moist Dressing daily until next [place] / Other MD appointment. [place] of regression in wound condition at [phone]. Please direct any NON-WOUND related issues/requests for orders to patient's Primary Care Physician Laboratory ordered were: Wound culture routine - left heel #1 I'm going to continue with Iodoflex, if I  can get a viable base on the metatarsal head wound consider Apligraf #2 CNS done of the heel wound lateral aspect, no. Antibiotics #3 I will consider a bone biopsy of the first metatarsal head on the left Electronic Signature(s) Signed: 11/02/2015 5:25:36 PM By: [name] MD Entered By: [name] on 11/01/2015 14:05:19 Cando, Junie Spencer ([id number]) -------------------------------------------------------------------------------- SuperBill Details Patient Name: Chris Bottoms K. Date of Service: 11/01/2015 Medical Record Patient Account Number: [id number] Number: Treating RN: Baruch Gouty, RN, BSN, Rita 1924-07-21 [id number] y.o. Other Clinician: Date of Birth/Sex: Male) Treating Uma Jerde, [place] Primary Care Physician: Golden Pop Physician/Extender: G Referring Physician: Golden Pop Weeks in Treatment: 14 Diagnosis Coding ICD-10 Codes Code Description [phone] Non-pressure chronic ulcer of other part of left foot with fat layer exposed L97.511 Non-pressure chronic ulcer of other part of right foot limited to breakdown of skin E11.621 Type 2 diabetes mellitus with foot ulcer E11.51 Type 2 diabetes mellitus with diabetic peripheral angiopathy without gangrene Facility Procedures CPT4 Code Description: [id number] 11042 - DEB SUBQ TISSUE 20 SQ CM/< ICD-10 Description Diagnosis L97.522 Non-pressure chronic ulcer of other part of left foot Modifier: with fat lay Quantity: 1 er exposed Physician Procedures CPT4 Code Description: [id number] 11042 - WC PHYS SUBQ TISS 20 SQ CM ICD-10 Description Diagnosis L97.522 Non-pressure chronic ulcer of other part of left foot Modifier: with fat laye Quantity: 1 r exposed Electronic Signature(s) Signed: 11/02/2015 5:25:36 PM By: [name] MD Entered By: [name] on 11/01/2015 14:05:44

## 2015-11-03 NOTE — Progress Notes (Addendum)
[name], Onyx K. ([id number]) Visit Report for 11/01/2015 Arrival Information Details Patient Name: [name], [name] Date of Service: 11/01/2015 12:45 PM Medical Record Number: [id number] Patient Account Number: [id number] Date of Birth/Sex: [birth year](82 y.o. Male) Treating RN: Clover MealyAfful, RN, BSN, [place] Sinkita Primary Care Physician: Vonita MossRISSMAN, MARK Other Clinician: Referring Physician: Vonita MossRISSMAN, MARK Treating Physician/Extender: Altamese CarolinaOBSON, MICHAEL G Weeks in Treatment: 14 Visit Information History Since Last Visit Added or deleted any medications: No Patient Arrived: Wheel Chair Any new allergies or adverse reactions: No Arrival Time: 12:47 Had a fall or experienced change in No activities of daily living that may affect Accompanied By: son risk of falls: Transfer Assistance: None Signs or symptoms of abuse/neglect since last No Patient Identification Verified: Yes visito Secondary Verification Process Yes Has Dressing in Place as Prescribed: Yes Completed: Pain Present Now: No Patient Requires Transmission-Based No Precautions: Patient Has Alerts: No Electronic Signature(s) Signed: 11/01/2015 3:28:31 PM By: Elpidio EricAfful, Rita BSN, RN Entered By: Elpidio EricAfful, Rita on 11/01/2015 12:48:10 [name], [name] ([id number]) -------------------------------------------------------------------------------- Encounter Discharge Information Details Patient Name: [name], [name] Date of Service: 11/01/2015 12:45 PM Medical Record Number: [id number] Patient Account Number: [id number] Date of Birth/Sex: [birth year](82 y.o. Male) Treating RN: Clover MealyAfful, RN, BSN, [place] Sinkita Primary Care Physician: Vonita MossRISSMAN, MARK Other Clinician: Referring Physician: Vonita MossRISSMAN, MARK Treating Physician/Extender: Altamese CarolinaOBSON, MICHAEL G Weeks in Treatment: 14 Encounter Discharge Information Items Discharge Pain Level: 0 Discharge Condition: Stable Ambulatory Status: Wheelchair Discharge Destination: Home Transportation: Private Auto Accompanied By:  son Schedule Follow-up Appointment: No Medication Reconciliation completed and provided to Patient/Care No Limuel Nieblas: Provided on Clinical Summary of Care: 11/01/2015 Form Type Recipient Paper [facility] Signature(s) Signed: 11/01/2015 3:28:31 PM By: Elpidio EricAfful, Rita BSN, RN Previous Signature: 11/01/2015 1:31:49 PM Version By: Gwenlyn PerkingMoore, Shelia Entered By: Elpidio EricAfful, Rita on 11/01/2015 13:32:38 [name], [name] ([id number]) -------------------------------------------------------------------------------- Lower Extremity Assessment Details Patient Name: [name], [name] Date of Service: 11/01/2015 12:45 PM Medical Record Number: [id number] Patient Account Number: [id number] Date of Birth/Sex: [birth year](82 y.o. Male) Treating RN: Clover MealyAfful, RN, BSN, [place] Sinkita Primary Care Physician: Vonita MossRISSMAN, MARK Other Clinician: Referring Physician: Vonita MossRISSMAN, MARK Treating Physician/Extender: Altamese CarolinaOBSON, MICHAEL G Weeks in Treatment: 14 Vascular Assessment Pulses: Posterior Tibial Dorsalis Pedis Doppler: [Left:Monophasic] Extremity colors, hair growth, and conditions: Extremity Color: [Left:Normal] Hair Growth on Extremity: [Left:No] Temperature of Extremity: [Left:Warm] Capillary Refill: [Left:< 3 seconds] Electronic Signature(s) Signed: 11/01/2015 3:28:31 PM By: Elpidio EricAfful, Rita BSN, RN Entered By: Elpidio EricAfful, Rita on 11/01/2015 12:51:45 [name], [name] ([id number]) -------------------------------------------------------------------------------- Multi Wound Chart Details Patient Name: [name], [name] Date of Service: 11/01/2015 12:45 PM Medical Record Number: [id number] Patient Account Number: [id number] Date of Birth/Sex: [birth year](82 y.o. Male) Treating RN: Clover MealyAfful, RN, BSN, [place] Sinkita Primary Care Physician: Vonita MossRISSMAN, MARK Other Clinician: Referring Physician: Vonita MossRISSMAN, MARK Treating Physician/Extender: Altamese CarolinaOBSON, MICHAEL G Weeks in Treatment: 14 Vital Signs Height(in): 72 Pulse(bpm): 71 Weight(lbs): 195 Blood  Pressure 134/60 (mmHg): Body Mass Index(BMI): 26 Temperature(F): 97.1 Respiratory Rate 18 (breaths/min): Photos: [5:No Photos] [6R:No Photos] [7:No Photos] Wound Location: [5:Left Calcaneus] [6R:Left Foot - Plantar] [7:Left Metatarsal head first] Wounding Event: [5:Blister] [6R:Gradually Appeared] [7:Gradually Appeared] 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] [6R:the Lower Extremity] [7:the Lower Extremity] Comorbid History: [5:Hypertension, Type II Diabetes, History of pressure wounds, Osteoarthritis, Neuropathy Osteoarthritis, Neuropathy Osteoarthritis, Neuropathy] [6R:Hypertension, Type II Diabetes, History of pressure wounds,] [7:Hypertension, Type II  Diabetes, History of pressure wounds,] Date Acquired: [5:07/11/2015] [6R:07/12/2015] [7:07/11/2015] Weeks of Treatment: [5:14] [6R:14] [7:14] Wound Status: [5:Open] [6R:Open] [7:Open] Wound Recurrence: [5:No] [  6R:Yes] [7:No] Measurements L x W x D 1x1.5x0.2 [6R:0.5x0.7x0.1] [7:3.5x3.5x0.2] (cm) Area (cm) : [5:1.178] [6R:0.275] [7:9.621] Volume (cm) : [5:0.236] [6R:0.027] [7:1.924] % Reduction in Area: [5:86.40%] [6R:91.20%] [7:-716.70%] % Reduction in Volume: 72.70% [6R:91.40%] [7:-1380.00%] Classification: [5:Grade 1] [6R:Grade 1] [7:Grade 1] Exudate Amount: [5:Large] [6R:Large] [7:Large] Exudate Type: [5:Serosanguineous] [6R:Serous] [7:Serosanguineous] Exudate Color: [5:red, brown] [6R:amber] [7:red, brown] Wound Margin: [5:Distinct, outline attached Distinct, outline attached Distinct, outline attached] Granulation Amount: [5:None Present (0%)] [6R:Large (67-100%)] [7:Small (1-33%)] Granulation Quality: [5:N/A] [6R:Pink] [7:Pink] Necrotic Amount: [5:Large (67-100%)] [6R:Small (1-33%)] [7:Large (67-100%)] Necrotic Tissue: [5:Eschar] [6R:Adherent Slough] [7:Adherent Slough] Exposed Structures: [5:Fascia: No Fat: No] [6R:Fascia: No Fat: No] [7:Fascia: No  Fat: No] Tendon: No Tendon: No Tendon: No Muscle: No Muscle: No Muscle: No Joint: No Joint: No Joint: No Bone: No Bone: No Bone: No Limited to Skin Limited to Skin Limited to Skin Breakdown Breakdown Breakdown Epithelialization: None Medium (34-66%) Small (1-33%) Periwound Skin Texture: Edema: Yes Edema: Yes Edema: Yes Excoriation: No Callus: Yes Callus: Yes Induration: No Excoriation: No Excoriation: No Callus: No Induration: No Induration: No Crepitus: No Crepitus: No Crepitus: No Fluctuance: No Fluctuance: No Fluctuance: No Friable: No Friable: No Friable: No Rash: No Rash: No Rash: No Scarring: No Scarring: No Scarring: No Periwound Skin Moist: Yes Moist: Yes Maceration: Yes Moisture: Dry/Scaly: Yes Maceration: No Moist: Yes Maceration: No Dry/Scaly: No Dry/Scaly: No Periwound Skin Color: Atrophie Blanche: No Atrophie Blanche: No Atrophie Blanche: No Cyanosis: No Cyanosis: No Cyanosis: No Ecchymosis: No Ecchymosis: No Ecchymosis: No Erythema: No Erythema: No Erythema: No Hemosiderin Staining: No Hemosiderin Staining: No Hemosiderin Staining: No Mottled: No Mottled: No Mottled: No Pallor: No Pallor: No Pallor: No Rubor: No Rubor: No Rubor: No Temperature: No Abnormality No Abnormality No Abnormality Tenderness on Yes Yes Yes Palpation: Wound Preparation: Ulcer Cleansing: Ulcer Cleansing: Ulcer Cleansing: Rinsed/Irrigated with Rinsed/Irrigated with Rinsed/Irrigated with Saline Saline Saline Topical Anesthetic Topical Anesthetic Topical Anesthetic Applied: Other: lidocaine Applied: Other: lidocaine Applied: Other: lidocaine 4% 4% 4% Treatment Notes Electronic Signature(s) Signed: 11/01/2015 3:28:31 PM By: Elpidio Eric BSN, RN Entered By: Elpidio Eric on 11/01/2015 13:11:54 [name] ([id number]) -------------------------------------------------------------------------------- Multi-Disciplinary Care Plan Details Patient  Name: Chris Riding K. Date of Service: 11/01/2015 12:45 PM Medical Record Number: [id number] Patient Account Number: [id number] Date of Birth/Sex: [birth year] (82 y.o. Male) Treating RN: Clover Mealy, RN, BSN, [place] Sink Primary Care Physician: Vonita Moss Other Clinician: Referring Physician: Vonita Moss Treating Physician/Extender: Altamese [place] in Treatment: 14 Active Inactive Electronic Signature(s) Signed: 11/09/2015 2:18:46 PM By: Elpidio Eric BSN, RN Previous Signature: 11/01/2015 3:28:31 PM Version By: Elpidio Eric BSN, RN Entered By: Elpidio Eric on 11/09/2015 14:18:45 [name] ([id number]) -------------------------------------------------------------------------------- Pain Assessment Details Patient Name: Chris Riding K. Date of Service: 11/01/2015 12:45 PM Medical Record Number: [id number] Patient Account Number: [id number] Date of Birth/Sex: Feb 14, 1925 (82 y.o. Male) Treating RN: Clover Mealy, RN, BSN, [place] Sink Primary Care Physician: Vonita Moss Other Clinician: Referring Physician: Vonita Moss Treating Physician/Extender: Altamese  in Treatment: 14 Active Problems Location of Pain Severity and Description of Pain Patient Has Paino No Site Locations With Dressing Change: No Pain Management and Medication Current Pain Management: Electronic Signature(s) Signed: 11/01/2015 3:28:31 PM By: Elpidio Eric BSN, RN Entered By: Elpidio Eric on 11/01/2015 12:48:28 [name] ([id number]) -------------------------------------------------------------------------------- Patient/Caregiver Education Details Patient Name: Chris Riding K. Date of Service: 11/01/2015 12:45 PM Medical Record Number: [id number] Patient Account Number: [id number] Date of Birth/Gender: [birth year] (82 y.o. Male) Treating RN: Clover Mealy, RN, BSN, [employer]  Primary Care Physician: Vonita Moss Other Clinician: Referring Physician: Vonita Moss Treating Physician/Extender: Altamese [place] in Treatment: 14 Education Assessment Education Provided To: Patient Education Topics Provided Basic Hygiene: Methods: Explain/Verbal Responses: State content correctly Safety: Methods: Explain/Verbal Responses: State content correctly [facility]: Methods: Explain/Verbal Responses: State content correctly Wound/Skin Impairment: Methods: Explain/Verbal Responses: State content correctly Electronic Signature(s) Signed: 11/01/2015 3:28:31 PM By: Elpidio Eric BSN, RN Entered By: Elpidio Eric on 11/01/2015 13:32:57 Berninger, Chris Dew ([id number]) -------------------------------------------------------------------------------- Wound Assessment Details Patient Name: Chris Riding K. Date of Service: 11/01/2015 12:45 PM Medical Record Number: [id number] Patient Account Number: [id number] Date of Birth/Sex: May 13, 1925 (82 y.o. Male) Treating RN: Clover Mealy, RN, BSN, Rita Primary Care Physician: Vonita Moss Other Clinician: Referring Physician: Vonita Moss Treating Physician/Extender: Altamese [place] in Treatment: 14 Wound Status Wound Number: 5 Primary Diabetic Wound/Ulcer of the Lower Etiology: Extremity Wound Location: Left Calcaneus Wound Open Wounding Event: Blister Status: Date Acquired: 07/11/2015 Comorbid Hypertension, Type II Diabetes, History Weeks Of Treatment: 14 History: of pressure wounds, Osteoarthritis, Clustered Wound: No Neuropathy Photos Photo Uploaded By: Elpidio Eric on 11/01/2015 15:19:35 Wound Measurements Length: (cm) 1 Width: (cm) 1.5 Depth: (cm) 0.2 Area: (cm) 1.178 Volume: (cm) 0.236 % Reduction in Area: 86.4% % Reduction in Volume: 72.7% Epithelialization: None Wound Description Classification: Grade 1 Wound Margin: Distinct, outline attached Exudate Amount: Large Exudate Type: Serosanguineous Exudate Color: red, brown Foul Odor After Cleansing: No Wound Bed Granulation Amount: None Present (0%)  Exposed Structure Necrotic Amount: Large (67-100%) Fascia Exposed: No Necrotic Quality: Eschar Fat Layer Exposed: No Tendon Exposed: No [name], [name] ([id number]) Muscle Exposed: No Joint Exposed: No Bone Exposed: No Limited to Skin Breakdown Periwound Skin Texture Texture Color No Abnormalities Noted: No No Abnormalities Noted: No Callus: No Atrophie Blanche: No Crepitus: No Cyanosis: No Excoriation: No Ecchymosis: No Fluctuance: No Erythema: No Friable: No Hemosiderin Staining: No Induration: No Mottled: No Localized Edema: Yes Pallor: No Rash: No Rubor: No Scarring: No Temperature / Pain Moisture Temperature: No Abnormality No Abnormalities Noted: No Tenderness on Palpation: Yes Dry / Scaly: Yes Maceration: No Moist: Yes Wound Preparation Ulcer Cleansing: Rinsed/Irrigated with Saline Topical Anesthetic Applied: Other: lidocaine 4%, Electronic Signature(s) Signed: 11/01/2015 1:05:49 PM By: Elpidio Eric BSN, RN Entered By: Elpidio Eric on 11/01/2015 13:05:49 [name] ([id number]) -------------------------------------------------------------------------------- Wound Assessment Details Patient Name: Chris Riding K. Date of Service: 11/01/2015 12:45 PM Medical Record Number: [id number] Patient Account Number: [id number] Date of Birth/Sex: [birth year] (82 y.o. Male) Treating RN: Clover Mealy, RN, BSN, Rita Primary Care Physician: Vonita Moss Other Clinician: Referring Physician: Vonita Moss Treating Physician/Extender: Altamese [place] in Treatment: 14 Wound Status Wound Number: [id number] Primary Diabetic Wound/Ulcer of the Lower Etiology: Extremity Wound Location: Left Foot - Plantar Wound Open Wounding Event: Gradually Appeared Status: Date Acquired: 07/12/2015 Comorbid Hypertension, Type II Diabetes, History Weeks Of Treatment: 14 History: of pressure wounds, Osteoarthritis, Clustered Wound: No Neuropathy Photos Photo Uploaded By: Elpidio Eric  on 11/01/2015 15:19:52 Wound Measurements Length: (cm) 0.5 Width: (cm) 0.7 Depth: (cm) 0.1 Area: (cm) 0.275 Volume: (cm) 0.027 % Reduction in Area: 91.2% % Reduction in Volume: 91.4% Epithelialization: Medium (34-66%) Tunneling: No Undermining: No Wound Description Classification: Grade 1 Wound Margin: Distinct, outline attached Exudate Amount: Large Exudate Type: Serous Exudate Color: amber Foul Odor After Cleansing: No Wound Bed Granulation Amount: Large (67-100%) Exposed Structure Granulation Quality: Pink Fascia Exposed: No Necrotic Amount: Small (1-33%) Fat Layer Exposed: No Necrotic Quality: Adherent  Slough Tendon Exposed: No [name], Rafeal K. ([id number]) Muscle Exposed: No Joint Exposed: No Bone Exposed: No Limited to Skin Breakdown Periwound Skin Texture Texture Color No Abnormalities Noted: No No Abnormalities Noted: No Callus: Yes Atrophie Blanche: No Crepitus: No Cyanosis: No Excoriation: No Ecchymosis: No Fluctuance: No Erythema: No Friable: No Hemosiderin Staining: No Induration: No Mottled: No Localized Edema: Yes Pallor: No Rash: No Rubor: No Scarring: No Temperature / Pain Moisture Temperature: No Abnormality No Abnormalities Noted: No Tenderness on Palpation: Yes Dry / Scaly: No Maceration: No Moist: Yes Wound Preparation Ulcer Cleansing: Rinsed/Irrigated with Saline Topical Anesthetic Applied: Other: lidocaine 4%, Electronic Signature(s) Signed: 11/01/2015 1:06:22 PM By: Elpidio Eric BSN, RN Entered By: Elpidio Eric on 11/01/2015 13:06:22 [name] ([id number]) -------------------------------------------------------------------------------- Wound Assessment Details Patient Name: Chris Riding K. Date of Service: 11/01/2015 12:45 PM Medical Record Number: [id number] Patient Account Number: [id number] Date of Birth/Sex: 1924-06-05 (82 y.o. Male) Treating RN: Clover Mealy, RN, BSN, Rita Primary Care Physician: Vonita Moss Other Clinician: Referring Physician: Vonita Moss Treating Physician/Extender: Altamese [place] in Treatment: 14 Wound Status Wound Number: 7 Primary Diabetic Wound/Ulcer of the Lower Etiology: Extremity Wound Location: Left Metatarsal head first Wound Open Wounding Event: Gradually Appeared Status: Date Acquired: 07/11/2015 Comorbid Hypertension, Type II Diabetes, History Weeks Of Treatment: 14 History: of pressure wounds, Osteoarthritis, Clustered Wound: No Neuropathy Photos Photo Uploaded By: Elpidio Eric on 11/01/2015 15:20:27 Wound Measurements Length: (cm) 3.5 Width: (cm) 3.5 Depth: (cm) 0.2 Area: (cm) 9.621 Volume: (cm) 1.924 % Reduction in Area: -716.7% % Reduction in Volume: -1380% Epithelialization: Small (1-33%) Tunneling: No Undermining: No Wound Description Classification: Grade 1 Wound Margin: Distinct, outline attached Exudate Amount: Large Exudate Type: Serosanguineous Exudate Color: red, brown Foul Odor After Cleansing: No Wound Bed Granulation Amount: Small (1-33%) Exposed Structure Granulation Quality: Pink Fascia Exposed: No Necrotic Amount: Large (67-100%) Fat Layer Exposed: No Necrotic Quality: Adherent Slough Tendon Exposed: No [place], Gjon K. ([id number]) Muscle Exposed: No Joint Exposed: No Bone Exposed: No Limited to Skin Breakdown Periwound Skin Texture Texture Color No Abnormalities Noted: No No Abnormalities Noted: No Callus: Yes Atrophie Blanche: No Crepitus: No Cyanosis: No Excoriation: No Ecchymosis: No Fluctuance: No Erythema: No Friable: No Hemosiderin Staining: No Induration: No Mottled: No Localized Edema: Yes Pallor: No Rash: No Rubor: No Scarring: No Temperature / Pain Moisture Temperature: No Abnormality No Abnormalities Noted: No Tenderness on Palpation: Yes Dry / Scaly: No Maceration: Yes Moist: Yes Wound Preparation Ulcer Cleansing: Rinsed/Irrigated with Saline Topical Anesthetic  Applied: Other: lidocaine 4%, Electronic Signature(s) Signed: 11/01/2015 1:06:41 PM By: Elpidio Eric BSN, RN Entered By: Elpidio Eric on 11/01/2015 13:06:41 [name] ([id number]) -------------------------------------------------------------------------------- Vitals Details Patient Name: Chris Riding K. Date of Service: 11/01/2015 12:45 PM Medical Record Number: [id number] Patient Account Number: [id number] Date of Birth/Sex: 14-Feb-1925 (82 y.o. Male) Treating RN: Clover Mealy, RN, BSN, Rita Primary Care Physician: Vonita Moss Other Clinician: Referring Physician: Vonita Moss Treating Physician/Extender: Altamese [place] in Treatment: 14 Vital Signs Time Taken: 12:51 Temperature (F): 97.1 Height (in): 72 Pulse (bpm): 71 Weight (lbs): 195 Respiratory Rate (breaths/min): 18 Body Mass Index (BMI): 26.4 Blood Pressure (mmHg): 134/60 Reference Range: 80 - 120 mg / dl Electronic Signature(s) Signed: 11/01/2015 3:28:31 PM By: Elpidio Eric BSN, RN Entered By: Elpidio Eric on 11/01/2015 12:51:28

## 2015-11-04 ENCOUNTER — Encounter: Payer: Self-pay | Admitting: Emergency Medicine

## 2015-11-04 ENCOUNTER — Emergency Department: Payer: PPO

## 2015-11-04 ENCOUNTER — Inpatient Hospital Stay
Admission: EM | Admit: 2015-11-04 | Discharge: 2015-11-09 | DRG: 240 | Disposition: A | Discharge status: Skilled Nursing Facility | Payer: PPO | Attending: Internal Medicine | Admitting: Internal Medicine

## 2015-11-04 DIAGNOSIS — E11621 Type 2 diabetes mellitus with foot ulcer: Secondary | ICD-10-CM | POA: Diagnosis present

## 2015-11-04 DIAGNOSIS — I10 Essential (primary) hypertension: Secondary | ICD-10-CM | POA: Diagnosis present

## 2015-11-04 DIAGNOSIS — G20 Parkinson's disease: Secondary | ICD-10-CM | POA: Diagnosis present

## 2015-11-04 DIAGNOSIS — E039 Hypothyroidism, unspecified: Secondary | ICD-10-CM | POA: Diagnosis present

## 2015-11-04 DIAGNOSIS — K5900 Constipation, unspecified: Secondary | ICD-10-CM | POA: Diagnosis not present

## 2015-11-04 DIAGNOSIS — L97429 Non-pressure chronic ulcer of left heel and midfoot with unspecified severity: Secondary | ICD-10-CM | POA: Diagnosis present

## 2015-11-04 DIAGNOSIS — M24562 Contracture, left knee: Secondary | ICD-10-CM | POA: Diagnosis present

## 2015-11-04 DIAGNOSIS — M869 Osteomyelitis, unspecified: Secondary | ICD-10-CM | POA: Diagnosis present

## 2015-11-04 DIAGNOSIS — B9562 Methicillin resistant Staphylococcus aureus infection as the cause of diseases classified elsewhere: Secondary | ICD-10-CM | POA: Diagnosis present

## 2015-11-04 DIAGNOSIS — L97524 Non-pressure chronic ulcer of other part of left foot with necrosis of bone: Secondary | ICD-10-CM | POA: Diagnosis present

## 2015-11-04 DIAGNOSIS — Z8673 Personal history of transient ischemic attack (TIA), and cerebral infarction without residual deficits: Secondary | ICD-10-CM | POA: Diagnosis not present

## 2015-11-04 DIAGNOSIS — Z7902 Long term (current) use of antithrombotics/antiplatelets: Secondary | ICD-10-CM

## 2015-11-04 DIAGNOSIS — Z955 Presence of coronary angioplasty implant and graft: Secondary | ICD-10-CM | POA: Diagnosis not present

## 2015-11-04 DIAGNOSIS — E1169 Type 2 diabetes mellitus with other specified complication: Secondary | ICD-10-CM | POA: Diagnosis present

## 2015-11-04 DIAGNOSIS — B964 Proteus (mirabilis) (morganii) as the cause of diseases classified elsewhere: Secondary | ICD-10-CM | POA: Diagnosis present

## 2015-11-04 DIAGNOSIS — E1142 Type 2 diabetes mellitus with diabetic polyneuropathy: Secondary | ICD-10-CM | POA: Diagnosis present

## 2015-11-04 DIAGNOSIS — I350 Nonrheumatic aortic (valve) stenosis: Secondary | ICD-10-CM | POA: Diagnosis present

## 2015-11-04 DIAGNOSIS — E785 Hyperlipidemia, unspecified: Secondary | ICD-10-CM | POA: Diagnosis present

## 2015-11-04 DIAGNOSIS — Z95828 Presence of other vascular implants and grafts: Secondary | ICD-10-CM

## 2015-11-04 DIAGNOSIS — I2510 Atherosclerotic heart disease of native coronary artery without angina pectoris: Secondary | ICD-10-CM | POA: Diagnosis present

## 2015-11-04 DIAGNOSIS — L03116 Cellulitis of left lower limb: Secondary | ICD-10-CM | POA: Diagnosis present

## 2015-11-04 DIAGNOSIS — Z79899 Other long term (current) drug therapy: Secondary | ICD-10-CM

## 2015-11-04 DIAGNOSIS — B9689 Other specified bacterial agents as the cause of diseases classified elsewhere: Secondary | ICD-10-CM | POA: Diagnosis present

## 2015-11-04 DIAGNOSIS — Z66 Do not resuscitate: Secondary | ICD-10-CM | POA: Diagnosis present

## 2015-11-04 DIAGNOSIS — Z7984 Long term (current) use of oral hypoglycemic drugs: Secondary | ICD-10-CM | POA: Diagnosis not present

## 2015-11-04 DIAGNOSIS — Z7982 Long term (current) use of aspirin: Secondary | ICD-10-CM | POA: Diagnosis not present

## 2015-11-04 DIAGNOSIS — B952 Enterococcus as the cause of diseases classified elsewhere: Secondary | ICD-10-CM | POA: Diagnosis present

## 2015-11-04 DIAGNOSIS — E1151 Type 2 diabetes mellitus with diabetic peripheral angiopathy without gangrene: Principal | ICD-10-CM | POA: Diagnosis present

## 2015-11-04 DIAGNOSIS — E118 Type 2 diabetes mellitus with unspecified complications: Secondary | ICD-10-CM

## 2015-11-04 LAB — CBC WITH DIFFERENTIAL/PLATELET
BASOS ABS: 0 10*3/uL (ref 0–0.1)
Eosinophils Absolute: 0.1 10*3/uL (ref 0–0.7)
Eosinophils Relative: 1 %
HEMATOCRIT: 36.6 % — AB (ref 40.0–52.0)
Hemoglobin: 12.3 g/dL — ABNORMAL LOW (ref 13.0–18.0)
Lymphocytes Relative: 9 %
Lymphs Abs: 1 10*3/uL (ref 1.0–3.6)
MCH: 30.4 pg (ref 26.0–34.0)
MCHC: 33.7 g/dL (ref 32.0–36.0)
MCV: 90.1 fL (ref 80.0–100.0)
MONO ABS: 0.8 10*3/uL (ref 0.2–1.0)
Monocytes Relative: 7 %
NEUTROS ABS: 8.6 10*3/uL — AB (ref 1.4–6.5)
Neutrophils Relative %: 83 %
Platelets: 182 10*3/uL (ref 150–440)
RBC: 4.06 MIL/uL — ABNORMAL LOW (ref 4.40–5.90)
RDW: 14.7 % — AB (ref 11.5–14.5)
WBC: 10.4 10*3/uL (ref 3.8–10.6)

## 2015-11-04 LAB — BASIC METABOLIC PANEL
ANION GAP: 8 (ref 5–15)
BUN: 24 mg/dL — ABNORMAL HIGH (ref 6–20)
CALCIUM: 8.7 mg/dL — AB (ref 8.9–10.3)
CO2: 29 mmol/L (ref 22–32)
Chloride: 101 mmol/L (ref 101–111)
Creatinine, Ser: 1.01 mg/dL (ref 0.61–1.24)
Glucose, Bld: 164 mg/dL — ABNORMAL HIGH (ref 65–99)
Potassium: 3.9 mmol/L (ref 3.5–5.1)
Sodium: 138 mmol/L (ref 135–145)

## 2015-11-04 LAB — GLUCOSE, CAPILLARY
GLUCOSE-CAPILLARY: 190 mg/dL — AB (ref 65–99)
Glucose-Capillary: 193 mg/dL — ABNORMAL HIGH (ref 65–99)

## 2015-11-04 LAB — SURGICAL PCR SCREEN
MRSA, PCR: POSITIVE — AB
Staphylococcus aureus: POSITIVE — AB

## 2015-11-04 MED ORDER — CARBIDOPA-LEVODOPA 25-100 MG PO TABS
1.0000 | ORAL_TABLET | Freq: Four times a day (QID) | ORAL | Status: DC
  Administered 2015-11-04 – 2015-11-09 (×19): 1 via ORAL
  Filled 2015-11-04 (×20): qty 1

## 2015-11-04 MED ORDER — CHLORHEXIDINE GLUCONATE CLOTH 2 % EX PADS
6.0000 | MEDICATED_PAD | Freq: Every day | CUTANEOUS | Status: AC
  Administered 2015-11-05 – 2015-11-09 (×5): 6 via TOPICAL

## 2015-11-04 MED ORDER — PIPERACILLIN-TAZOBACTAM 3.375 G IVPB 30 MIN
3.3750 g | Freq: Once | INTRAVENOUS | Status: AC
  Administered 2015-11-04: 3.375 g via INTRAVENOUS
  Filled 2015-11-04: qty 50

## 2015-11-04 MED ORDER — PIPERACILLIN-TAZOBACTAM 3.375 G IVPB
3.3750 g | Freq: Three times a day (TID) | INTRAVENOUS | Status: DC
  Administered 2015-11-04 – 2015-11-08 (×14): 3.375 g via INTRAVENOUS
  Filled 2015-11-04 (×18): qty 50

## 2015-11-04 MED ORDER — MUPIROCIN 2 % EX OINT
1.0000 | TOPICAL_OINTMENT | Freq: Two times a day (BID) | CUTANEOUS | Status: AC
Start: 2015-11-04 — End: 2015-11-09
  Administered 2015-11-04 – 2015-11-09 (×10): 1 via NASAL
  Filled 2015-11-04: qty 22

## 2015-11-04 MED ORDER — INSULIN ASPART 100 UNIT/ML ~~LOC~~ SOLN
0.0000 [IU] | Freq: Three times a day (TID) | SUBCUTANEOUS | Status: DC
  Administered 2015-11-04 – 2015-11-05 (×2): 2 [IU] via SUBCUTANEOUS
  Administered 2015-11-05 – 2015-11-06 (×2): 3 [IU] via SUBCUTANEOUS
  Administered 2015-11-06 (×2): 2 [IU] via SUBCUTANEOUS
  Administered 2015-11-07 (×2): 3 [IU] via SUBCUTANEOUS
  Administered 2015-11-08: 1 [IU] via SUBCUTANEOUS
  Administered 2015-11-08: 2 [IU] via SUBCUTANEOUS
  Administered 2015-11-08: 3 [IU] via SUBCUTANEOUS
  Administered 2015-11-09: 2 [IU] via SUBCUTANEOUS
  Filled 2015-11-04 (×2): qty 2
  Filled 2015-11-04 (×2): qty 3
  Filled 2015-11-04: qty 2
  Filled 2015-11-04 (×2): qty 3
  Filled 2015-11-04: qty 2
  Filled 2015-11-04: qty 3
  Filled 2015-11-04: qty 2
  Filled 2015-11-04: qty 3
  Filled 2015-11-04: qty 2
  Filled 2015-11-04: qty 1

## 2015-11-04 MED ORDER — CARBIDOPA-LEVODOPA ER 25-100 MG PO TBCR
1.0000 | EXTENDED_RELEASE_TABLET | Freq: Two times a day (BID) | ORAL | Status: DC
  Administered 2015-11-05 – 2015-11-09 (×8): 1 via ORAL
  Filled 2015-11-04 (×10): qty 1

## 2015-11-04 MED ORDER — CLOPIDOGREL BISULFATE 75 MG PO TABS
75.0000 mg | ORAL_TABLET | Freq: Every day | ORAL | Status: DC
  Administered 2015-11-06 – 2015-11-09 (×4): 75 mg via ORAL
  Filled 2015-11-04 (×4): qty 1

## 2015-11-04 MED ORDER — TAMSULOSIN HCL 0.4 MG PO CAPS
0.4000 mg | ORAL_CAPSULE | Freq: Every day | ORAL | Status: DC
  Administered 2015-11-04 – 2015-11-08 (×5): 0.4 mg via ORAL
  Filled 2015-11-04 (×5): qty 1

## 2015-11-04 MED ORDER — ASPIRIN EC 81 MG PO TBEC
81.0000 mg | DELAYED_RELEASE_TABLET | Freq: Every day | ORAL | Status: DC
  Administered 2015-11-06 – 2015-11-09 (×4): 81 mg via ORAL
  Filled 2015-11-04 (×4): qty 1

## 2015-11-04 MED ORDER — PRAMIPEXOLE DIHYDROCHLORIDE 0.25 MG PO TABS
0.5000 mg | ORAL_TABLET | Freq: Three times a day (TID) | ORAL | Status: DC
  Administered 2015-11-04 – 2015-11-09 (×14): 0.5 mg via ORAL
  Filled 2015-11-04 (×14): qty 2

## 2015-11-04 MED ORDER — POTASSIUM CHLORIDE CRYS ER 10 MEQ PO TBCR
10.0000 meq | EXTENDED_RELEASE_TABLET | Freq: Every day | ORAL | Status: DC
  Administered 2015-11-06 – 2015-11-09 (×4): 10 meq via ORAL
  Filled 2015-11-04 (×4): qty 1

## 2015-11-04 MED ORDER — CYANOCOBALAMIN 1000 MCG/ML IJ SOLN
1000.0000 ug | INTRAMUSCULAR | Status: DC
Start: 2015-12-03 — End: 2015-11-09

## 2015-11-04 MED ORDER — VANCOMYCIN HCL IN DEXTROSE 750-5 MG/150ML-% IV SOLN
750.0000 mg | Freq: Two times a day (BID) | INTRAVENOUS | Status: DC
  Administered 2015-11-04 – 2015-11-05 (×3): 750 mg via INTRAVENOUS
  Filled 2015-11-04 (×4): qty 150

## 2015-11-04 MED ORDER — HEPARIN SODIUM (PORCINE) 5000 UNIT/ML IJ SOLN
5000.0000 [IU] | Freq: Three times a day (TID) | INTRAMUSCULAR | Status: DC
  Administered 2015-11-05 – 2015-11-09 (×13): 5000 [IU] via SUBCUTANEOUS
  Filled 2015-11-04 (×14): qty 1

## 2015-11-04 MED ORDER — LEVOTHYROXINE SODIUM 75 MCG PO TABS
150.0000 ug | ORAL_TABLET | Freq: Every day | ORAL | Status: DC
  Administered 2015-11-06 – 2015-11-09 (×4): 150 ug via ORAL
  Filled 2015-11-04 (×4): qty 2

## 2015-11-04 MED ORDER — VANCOMYCIN HCL IN DEXTROSE 1-5 GM/200ML-% IV SOLN
1000.0000 mg | Freq: Once | INTRAVENOUS | Status: AC
  Administered 2015-11-04: 1000 mg via INTRAVENOUS
  Filled 2015-11-04: qty 200

## 2015-11-04 MED ORDER — AMIODARONE HCL 200 MG PO TABS
200.0000 mg | ORAL_TABLET | Freq: Every day | ORAL | Status: DC
  Administered 2015-11-06 – 2015-11-09 (×4): 200 mg via ORAL
  Filled 2015-11-04 (×4): qty 1

## 2015-11-04 NOTE — ED Provider Notes (Signed)
[facility] Emergency Department Provider Note   ____________________________________________  Time seen: Approximately 1040 AM  I have reviewed the triage vital signs and the nursing notes.   HISTORY  Chief Complaint Foot Pain   HPI [name] is a 82 y.o. male with a history of diabetes as well as peripheral vascular disease was presenting with left foot cellulitis after being sent in by his podiatrist. He said he was seen by the podiatrist yesterday was able to express a moderate amount of pus from his left foot wound. The patient denies any fever. Has also discussed possible amputation and sees Dr. Lorretta Harp who performed a vascular surgery to help with his vascular disease on the left side several months ago.   Past Medical History  Diagnosis Date  . Coronary artery disease   . Diabetes mellitus without complication (HCC)   . Aortic stenosis   . Hypertension   . Hyperlipidemia   . TIA (transient ischemic attack)   . Thyroid disease   . Hypothyroidism     Patient Active Problem List   Diagnosis Date Noted  . PAD (peripheral artery disease) (HCC) 10/28/2015  . Diabetes mellitus without complication (HCC) 07/11/2015  . Clavicle enlargement 07/11/2015  . Poorly controlled type 2 diabetes mellitus (HCC) 06/23/2014  . Bed sore on heel, right, unstageable (HCC) 09/23/2013  . Coronary atherosclerosis of native coronary artery 03/27/2013  . Chronic diastolic CHF (congestive heart failure) (HCC) 03/27/2013  . Aortic valve stenosis 03/27/2013  . Essential hypertension 03/27/2013  . Hyperlipidemia 03/27/2013  . Atrial fibrillation (HCC) 03/27/2013  . Bilateral leg edema 03/27/2013  . Constipation 03/27/2013    Past Surgical History  Procedure Laterality Date  . Appendectomy    . Hernia repair    . Knee surgery      bilateral  . Eye surgery    . Colonoscopy    . Cardiac catheterization  2010    showed a patient circumflex stent with  noncritical CAD and normal right-sided pressures.  . Coronary angioplasty  01/24/2007    stent placement to the mid circumflex   . Knee surgery Left   . Peripheral vascular catheterization N/A 08/30/2015    Procedure: Abdominal Aortogram w/Lower Extremity;  Surgeon: Renford Dills, MD;  Location: [facility] INVASIVE CV LAB;  Service: Cardiovascular;  Laterality: N/A;  . Peripheral vascular catheterization  08/30/2015    Procedure: Lower Extremity Intervention;  Surgeon: Renford Dills, MD;  Location: [facility] INVASIVE CV LAB;  Service: Cardiovascular;;    Current Outpatient Rx  Name  Route  Sig  Dispense  Refill  . amiodarone (PACERONE) 200 MG tablet      TAKE ONE-HALF TABLET TWICE A DAY   90 tablet   2   . aspirin EC 81 MG tablet   Oral   Take 1 tablet (81 mg total) by mouth daily.   150 tablet   2   . carbidopa-levodopa (SINEMET IR) 25-100 MG per tablet   Oral   Take 1 tablet by mouth 4 (four) times daily.         . Carbidopa-Levodopa ER (SINEMET CR) 25-100 MG tablet controlled release   Oral   Take 1 tablet by mouth 2 (two) times daily.         . clopidogrel (PLAVIX) 75 MG tablet      TAKE 1 TABLET EVERY DAY   30 tablet   3   . cyanocobalamin (,VITAMIN B-12,) 1000 MCG/ML injection   Intramuscular  Inject 1 mL (1,000 mcg total) into the muscle every 30 (thirty) days. 1 injection in 2 weeks then every 30 days   1 mL   12   . furosemide (LASIX) 40 MG tablet   Oral   Take 1 tablet (40 mg total) by mouth 2 (two) times daily.   60 tablet   6   . levothyroxine (SYNTHROID, LEVOTHROID) 150 MCG tablet   Oral   Take 150 mcg by mouth daily before breakfast.         . potassium chloride (K-DUR,KLOR-CON) 10 MEQ tablet   Oral   Take 10 mEq by mouth daily.          . pramipexole (MIRAPEX) 1 MG tablet      Take 1/2 tablet four times daily.         . sitaGLIPtin (JANUVIA) 100 MG tablet   Oral   Take 1 tablet (100 mg total) by mouth daily.   90 tablet   1   .  tamsulosin (FLOMAX) 0.4 MG CAPS capsule   Oral   Take 1 capsule (0.4 mg total) by mouth daily after supper.   30 capsule   6     Allergies Review of patient's allergies indicates no known allergies.  Family History  Problem Relation Age of Onset  . Heart disease Mother   . Heart attack Father 24    MI  . Heart attack Brother 32    MI    Social History Social History  Substance Use Topics  . Smoking status: Never Smoker   . Smokeless tobacco: Never Used  . Alcohol Use: No    Review of Systems Constitutional: No fever/chills Eyes: No visual changes. ENT: No sore throat. Cardiovascular: Denies chest pain. Respiratory: Denies shortness of breath. Gastrointestinal: No abdominal pain.  No nausea, no vomiting.  No diarrhea.  No constipation. Genitourinary: Negative for dysuria. Musculoskeletal: Negative for back pain. Skin: Erythematous to the left foot Neurological: Negative for headaches, focal weakness or numbness.  10-point ROS otherwise negative.  ____________________________________________   PHYSICAL EXAM:  VITAL SIGNS: ED Triage Vitals  Enc Vitals Group     BP 11/04/15 0932 116/70 mmHg     Pulse Rate 11/04/15 0932 86     Resp 11/04/15 0932 18     Temp 11/04/15 0932 97.5 F (36.4 C)     Temp Source 11/04/15 0932 Oral     SpO2 11/04/15 0932 100 %     Weight 11/04/15 0932 197 lb (89.359 kg)     Height --      Head Cir --      Peak Flow --      Pain Score 11/04/15 0932 4     Pain Loc --      Pain Edu? --      Excl. in GC? --     Constitutional: Alert and oriented. Well appearing and in no acute distress. Eyes: Conjunctivae are normal. PERRL. EOMI. Head: Atraumatic. Nose: No congestion/rhinnorhea. Mouth/Throat: Mucous membranes are moist.   Neck: No stridor.   Cardiovascular: Normal rate, regular rhythm. Grossly normal heart sounds.   Respiratory: Normal respiratory effort.  No retractions. Lungs CTAB. Gastrointestinal: Soft and nontender. No  distention. No abdominal bruits. No CVA tenderness. Musculoskeletal: Mild edema to the bilateral lower extremities with the left being greater than the right. Neurologic:  Normal speech and language. Decreased sensation to light touch over both feet. Patient does have a history of neuropathy. Skin:  3 cm wide,  circular ulceration to the medial left forefoot over the MCP joint laterally. No pus however there is erythema extending out from the wound up onto the forefoot. There is edema over the foot to about the mid foot extending distally. Also with a small area of necrosis to the medial heel which is about 2 cm in circumference. No fluctuance or tenderness. Depth of the larger ulcer is to the subcutaneous tissues but there is no exposed bone. Psychiatric: Mood and affect are normal. Speech and behavior are normal.  ____________________________________________   LABS (all labs ordered are listed, but only abnormal results are displayed)  Labs Reviewed  CBC WITH DIFFERENTIAL/PLATELET  BASIC METABOLIC PANEL   ____________________________________________  EKG   ____________________________________________  RADIOLOGY  No obvious osteomyelitis on the foot x-ray by my inspection. ____________________________________________   PROCEDURES   ____________________________________________   INITIAL IMPRESSION / ASSESSMENT AND PLAN / ED COURSE  Pertinent labs & imaging results that were available during my care of the patient were reviewed by me and considered in my medical decision making (see chart for details).  Patient with foot infection and wound care as an outpatient. Likely multifactorial related to his diabetes in addition to his peripheral vascular disease. We'll admit to the hospital for IV antibiotics as well as vascular consultation. Signed out to Dr. Elisabeth PigeonVachhani. Patient aware of plan. ____________________________________________   FINAL CLINICAL IMPRESSION(S) / ED  DIAGNOSES  Foot cellulitis and diabetic with peripheral vascular disease.    NEW MEDICATIONS STARTED DURING THIS VISIT:  New Prescriptions   No medications on file     Note:  This document was prepared using Dragon voice recognition software and may include unintentional dictation errors.    Myrna Blazeravid Matthew Travers Goodley, MD 11/04/15 1102

## 2015-11-04 NOTE — Consult Note (Signed)
[facility] Vascular Consult Note  MRN : [id number]  [name] is a 82 y.o. (1924/06/11) male who presents with chief complaint of  Chief Complaint  Patient presents with  . Foot Pain  .  History of Present Illness: I am asked to see the patient by Dr. Orland Jarred for evaluation of his PAD. He had an angiogram with PT intervention about three months ago with small vessel disease seen.  He has had progression of his heel ulcer and left great toe ulceration with osteomyelitis present in the toe.  Needs toe amputation and debridement at a minimum, and he has a fixed contracture of his knee making consideration for AKA reasonable.  He has little pain currently.  He had fever which has improved.  He is now on IV ABx  Current Facility-Administered Medications  Medication Dose Route Frequency Provider Last Rate Last Dose  . [START ON 11/05/2015] amiodarone (PACERONE) tablet 200 mg  200 mg Oral Daily Altamese Dilling, MD      . Melene Muller ON 11/05/2015] aspirin EC tablet 81 mg  81 mg Oral Daily Altamese Dilling, MD      . carbidopa-levodopa (SINEMET IR) 25-100 MG per tablet immediate release 1 tablet  1 tablet Oral QID Altamese Dilling, MD   1 tablet at 11/04/15 1524  . Carbidopa-Levodopa ER (SINEMET CR) 25-100 MG tablet controlled release 1 tablet  1 tablet Oral BID Altamese Dilling, MD      . Melene Muller ON 11/05/2015] clopidogrel (PLAVIX) tablet 75 mg  75 mg Oral Daily Altamese Dilling, MD      . Melene Muller ON 12/03/2015] cyanocobalamin ((VITAMIN B-12)) injection 1,000 mcg  1,000 mcg Intramuscular Q30 days Altamese Dilling, MD      . heparin injection 5,000 Units  5,000 Units Subcutaneous Q8H Altamese Dilling, MD      . insulin aspart (novoLOG) injection 0-9 Units  0-9 Units Subcutaneous TID WC Altamese Dilling, MD      . Melene Muller ON 11/05/2015] levothyroxine (SYNTHROID, LEVOTHROID) tablet 150 mcg  150 mcg Oral QAC breakfast Altamese Dilling, MD       . piperacillin-tazobactam (ZOSYN) IVPB 3.375 g  3.375 g Intravenous Q8H Melissa D Maccia, RPH   3.375 g at 11/04/15 1526  . [START ON 11/05/2015] potassium chloride (K-DUR,KLOR-CON) CR tablet 10 mEq  10 mEq Oral Daily Altamese Dilling, MD      . pramipexole (MIRAPEX) tablet 0.5 mg  0.5 mg Oral TID Altamese Dilling, MD   0.5 mg at 11/04/15 1526  . tamsulosin (FLOMAX) capsule 0.4 mg  0.4 mg Oral QPC supper Altamese Dilling, MD      . vancomycin (VANCOCIN) IVPB 750 mg/150 ml premix  750 mg Intravenous Q12H Olene Floss, [facility]        Past Medical History  Diagnosis Date  . Coronary artery disease   . Diabetes mellitus without complication (HCC)   . Aortic stenosis   . Hypertension   . Hyperlipidemia   . TIA (transient ischemic attack)   . Thyroid disease   . Hypothyroidism     Past Surgical History  Procedure Laterality Date  . Appendectomy    . Hernia repair    . Knee surgery      bilateral  . Eye surgery    . Colonoscopy    . Cardiac catheterization  2010    showed a patient circumflex stent with noncritical CAD and normal right-sided pressures.  . Coronary angioplasty  01/24/2007    stent placement to the  mid circumflex   . Knee surgery Left   . Peripheral vascular catheterization N/A 08/30/2015    Procedure: Abdominal Aortogram w/Lower Extremity;  Surgeon: Renford DillsGregory G Schnier, MD;  Location: [facility] INVASIVE CV LAB;  Service: Cardiovascular;  Laterality: N/A;  . Peripheral vascular catheterization  08/30/2015    Procedure: Lower Extremity Intervention;  Surgeon: Renford DillsGregory G Schnier, MD;  Location: [facility] INVASIVE CV LAB;  Service: Cardiovascular;;    Social History Social History  Substance Use Topics  . Smoking status: Never Smoker   . Smokeless tobacco: Never Used  . Alcohol Use: No  no IVDU  Family History Family History  Problem Relation Age of Onset  . Heart disease Mother   . Heart attack Father [id number]    MI  . Heart attack Brother 7539    MI  No bleeding or  clotting disorders  No Known Allergies   REVIEW OF SYSTEMS (Negative unless checked)  Constitutional: [] Weight loss  [] Fever  [] Chills Cardiac: [] Chest pain   [] Chest pressure   [x] Palpitations   [] Shortness of breath when laying flat   [] Shortness of breath at rest   [] Shortness of breath with exertion. Vascular:  [] Pain in legs with walking   [] Pain in legs at rest   [] Pain in legs when laying flat   [] Claudication   [] Pain in feet when walking  [] Pain in feet at rest  [] Pain in feet when laying flat   [] History of DVT   [] Phlebitis   [x] Swelling in legs   [] Varicose veins   [x] Non-healing ulcers Pulmonary:   [] Uses home oxygen   [] Productive cough   [] Hemoptysis   [] Wheeze  [] COPD   [] Asthma Neurologic:  [] Dizziness  [] Blackouts   [] Seizures   [] History of stroke   [] History of TIA  [] Aphasia   [] Temporary blindness   [] Dysphagia   [] Weakness or numbness in arms   [] Weakness or numbness in legs Musculoskeletal:  [] Arthritis   [] Joint swelling   [] Joint pain   [] Low back pain Hematologic:  [] Easy bruising  [] Easy bleeding   [] Hypercoagulable state   [] Anemic  [] Hepatitis Gastrointestinal:  [] Blood in stool   [] Vomiting blood  [] Gastroesophageal reflux/heartburn   [] Difficulty swallowing. Genitourinary:  [] Chronic kidney disease   [] Difficult urination  [] Frequent urination  [] Burning with urination   [] Blood in urine Skin:  [] Rashes   [x] Ulcers   [x] Wounds Psychological:  [] History of anxiety   []  History of major depression.  Physical Examination  Filed Vitals:   11/04/15 1200 11/04/15 1230 11/04/15 1300 11/04/15 1351  BP: 105/56 120/58 117/65 147/69  Pulse: 58 59 61 79  Temp:    97.4 F (36.3 C)  TempSrc:    Oral  Resp:   18 18  Weight:      SpO2: 99% 96% 99% 98%   Body mass index is 26.71 kg/(m^2). Gen:  WD/WN, NAD Head: [place]/AT, No temporalis wasting. Prominent temp pulse not noted. Ear/Nose/Throat: Hearing grossly intact, nares w/o erythema or drainage, oropharynx w/o  Erythema/Exudate Eyes: PERRLA, EOMI.  Neck: Supple, no nuchal rigidity.  No JVD.  Pulmonary:  Good air movement, equal bilaterally.  Cardiac: Regular with murmur Vascular:  Vessel Right Left  Radial Palpable Palpable  Ulnar Palpable Palpable  Brachial Palpable Palpable  Carotid Palpable, without bruit Palpable, without bruit  Aorta Not palpable N/A  Femoral Palpable Palpable  Popliteal Not Palpable Not Palpable  PT Not Palpable Not Palpable  DP Not Palpable Not Palpable   Gastrointestinal: soft, non-tender/non-distended. No guarding/reflex. No masses, surgical  incisions, or scars. Musculoskeletal:   Extremities without ischemic changes.  Mild to moderate LE edema. Left knee contracture Neurologic: CN 2-12 intact. Pain and light touch intact in extremities.  Symmetrical.  Speech is fluent.  Psychiatric: Judgment intact, Mood & affect appropriate for pt's clinical situation. Dermatologic: necrotic ulcer on left heel and purulent drainage from left great toe ulceration with bone exposed. Lymph : No Cervical, Axillary, or Inguinal lymphadenopathy.      CBC Lab Results  Component Value Date   WBC 10.4 11/04/2015   HGB 12.3* 11/04/2015   HCT 36.6* 11/04/2015   MCV 90.1 11/04/2015   PLT 182 11/04/2015    BMET    Component Value Date/Time   NA 138 11/04/2015 1105   NA 142 09/27/2015 1506   NA 139 10/30/2013 0715   K 3.9 11/04/2015 1105   K 4.2 10/30/2013 0715   CL 101 11/04/2015 1105   CL 104 10/30/2013 0715   CO2 29 11/04/2015 1105   CO2 29 10/30/2013 0715   GLUCOSE 164* 11/04/2015 1105   GLUCOSE 175* 09/27/2015 1506   GLUCOSE 162* 10/30/2013 0715   BUN 24* 11/04/2015 1105   BUN 24 09/27/2015 1506   BUN 16 10/30/2013 0715   CREATININE 1.01 11/04/2015 1105   CREATININE 1.15 10/30/2013 0715   CREATININE 1.34 10/21/2012 1057   CALCIUM 8.7* 11/04/2015 1105   CALCIUM 9.2 10/30/2013 0715   GFRNONAA >60 11/04/2015 1105   GFRNONAA 57* 10/30/2013 0715   GFRAA >60  11/04/2015 1105   GFRAA >60 10/30/2013 0715   Estimated Creatinine Clearance: 55.5 mL/min (by C-G formula based on Cr of 1.01).  COAG Lab Results  Component Value Date   INR 1.1 09/29/2008    Radiology Dg Foot Complete Left  11/04/2015  CLINICAL DATA:  Medial forefoot and medial heel ulcers. EXAM: LEFT FOOT - COMPLETE 3+ VIEW COMPARISON:  07/22/2015 FINDINGS: Marked hallux valgus deformity identified. Status post osteotomy of the distal aspect of the fifth metatarsal. Soft tissue ulceration overlying in the first MTP joint is identified. No underlying bone erosion identified. Hammertoe deformities are present. Diffuse soft tissue swelling. IMPRESSION: 1. Soft tissue ulceration overlying the first MTP joint. No underlying bone erosion noted. 2. Chronic changes as above Electronically Signed   By: Signa Kell M.D.   On: 11/04/2015 12:11     Assessment/Plan 1. PAD with ulceration and infection left great toe and heel.  Very difficult situation.  Has had an angio about 3 months ago with one vessel runoff and small vessel disease at the end.  Will need the toe removed and the heel debrided.  Perfusion is likely not ideal, and may require further angiogram if not ideal at the time of debridement.  He desires every attempt at limb salvage even though he is non-ambulatory and he is lucid and in right mind.  I will place him on the Angio schedule for Monday tentatively depending on how perfusion appears at debridement this weekend.  Risks and benefits discussed. 2. Left heel and great toe ulceration with osteomyelitis.  Podiatry following and plans debridement this weekend.   3. DM.  Glucose control important in wound healing and slowing progression of PAD. 4. HTN. Stable on outpatient meds 5. CAD. No current angina   DEW,JASON, MD  11/04/2015 5:11 PM

## 2015-11-04 NOTE — ED Notes (Signed)
Pt to ed with c/o left foot pain and ulcer.  Pt was seen by Dr Graciela HusbandsKlein yesterday for same and was encouraged to come to ed for admission.

## 2015-11-04 NOTE — H&P (Signed)
[facility] - [place] [facility]   PATIENT NAME: [name]    MR#:  [id number]  DATE OF BIRTH:  14-Aug-1924  DATE OF ADMISSION:  11/04/2015  PRIMARY CARE PHYSICIAN: Vonita MossMark Crissman, MD   REQUESTING/REFERRING PHYSICIAN: Schaevitz  CHIEF COMPLAINT:   Chief Complaint  Patient presents with  . Foot Pain    HISTORY OF PRESENT ILLNESS: [name]  is a 82 y.o. male with a known history of Coronary artery disease, diabetes, aortic stenosis, hypertension, hyperlipidemia, thyroid disease, hypothyroidism- has chronic circulatory issue in both lower extremities and he had undergone surgery by vascular Dr.Schiener- successful restoration of the circulation on the right lower extremity but on the left after 3-4 surgeries there was not successful circulation up to her foot. He follows with the phone the clinic and podiatry clinic for his chronic wound on the left foot. Was recently given a course of antibiotic which she finished a few weeks ago. For last few days his wound on the right foot started turning red again and had some discharge, so he went to podiatry clinic yesterday and after looking at his wound Dr. Graciela HusbandsKlein advised him to go to emergency room. So he came to emergency room today.  He denies any pain or fever but he does not have much sensations on his left lower extremity. X-ray of the foot does not show involvement of the bones.  PAST MEDICAL HISTORY:   Past Medical History  Diagnosis Date  . Coronary artery disease   . Diabetes mellitus without complication (HCC)   . Aortic stenosis   . Hypertension   . Hyperlipidemia   . TIA (transient ischemic attack)   . Thyroid disease   . Hypothyroidism     PAST SURGICAL HISTORY: Past Surgical History  Procedure Laterality Date  . Appendectomy    . Hernia repair    . Knee surgery      bilateral  . Eye surgery    . Colonoscopy    . Cardiac catheterization  2010    showed a patient circumflex stent with  noncritical CAD and normal right-sided pressures.  . Coronary angioplasty  01/24/2007    stent placement to the mid circumflex   . Knee surgery Left   . Peripheral vascular catheterization N/A 08/30/2015    Procedure: Abdominal Aortogram w/Lower Extremity;  Surgeon: Renford DillsGregory G Schnier, MD;  Location: [facility] INVASIVE CV LAB;  Service: Cardiovascular;  Laterality: N/A;  . Peripheral vascular catheterization  08/30/2015    Procedure: Lower Extremity Intervention;  Surgeon: Renford DillsGregory G Schnier, MD;  Location: [facility] INVASIVE CV LAB;  Service: Cardiovascular;;    SOCIAL HISTORY:  Social History  Substance Use Topics  . Smoking status: Never Smoker   . Smokeless tobacco: Never Used  . Alcohol Use: No    FAMILY HISTORY:  Family History  Problem Relation Age of Onset  . Heart disease Mother   . Heart attack Father 3762    MI  . Heart attack Brother 39    MI    DRUG ALLERGIES: No Known Allergies  REVIEW OF SYSTEMS:   CONSTITUTIONAL: No fever, fatigue or weakness.  EYES: No blurred or double vision.  EARS, NOSE, AND THROAT: No tinnitus or ear pain.  RESPIRATORY: No cough, shortness of breath, wheezing or hemoptysis.  CARDIOVASCULAR: No chest pain, orthopnea, edema.  GASTROINTESTINAL: No nausea, vomiting, diarrhea or abdominal pain.  GENITOURINARY: No dysuria, hematuria.  ENDOCRINE: No polyuria, nocturia,  HEMATOLOGY: No anemia, easy bruising or bleeding SKIN: No  rash or lesion. MUSCULOSKELETAL: No joint pain or arthritis.  Ulcer on the left foot. NEUROLOGIC: No tingling, numbness, weakness.  PSYCHIATRY: No anxiety or depression.   MEDICATIONS AT HOME:  Prior to Admission medications   Medication Sig Start Date End Date Taking? Authorizing Provider  amiodarone (PACERONE) 200 MG tablet TAKE ONE-HALF TABLET TWICE A DAY 09/09/15  Yes Antonieta Iba, MD  aspirin EC 81 MG tablet Take 1 tablet (81 mg total) by mouth daily. 08/30/15  Yes Renford Dills, MD  carbidopa-levodopa (SINEMET IR)  25-100 MG per tablet Take 1 tablet by mouth 4 (four) times daily.   Yes Historical Provider, MD  Carbidopa-Levodopa ER (SINEMET CR) 25-100 MG tablet controlled release Take 1 tablet by mouth 2 (two) times daily.   Yes Historical Provider, MD  clopidogrel (PLAVIX) 75 MG tablet TAKE 1 TABLET EVERY DAY 10/17/15  Yes Antonieta Iba, MD  cyanocobalamin (,VITAMIN B-12,) 1000 MCG/ML injection Inject 1 mL (1,000 mcg total) into the muscle every 30 (thirty) days. 1 injection in 2 weeks then every 30 days 09/19/15  Yes Steele Sizer, MD  furosemide (LASIX) 40 MG tablet Take 1 tablet (40 mg total) by mouth 2 (two) times daily. 07/29/15  Yes Antonieta Iba, MD  levothyroxine (SYNTHROID, LEVOTHROID) 150 MCG tablet Take 150 mcg by mouth daily before breakfast.   Yes Historical Provider, MD  potassium chloride (K-DUR,KLOR-CON) 10 MEQ tablet Take 10 mEq by mouth daily.  10/07/15  Yes Historical Provider, MD  pramipexole (MIRAPEX) 1 MG tablet Take 1/2 tablet four times daily.   Yes Historical Provider, MD  sitaGLIPtin (JANUVIA) 100 MG tablet Take 1 tablet (100 mg total) by mouth daily. 07/11/15  Yes Steele Sizer, MD  tamsulosin (FLOMAX) 0.4 MG CAPS capsule Take 1 capsule (0.4 mg total) by mouth daily after supper. 10/17/15  Yes Steele Sizer, MD      PHYSICAL EXAMINATION:   VITAL SIGNS: Blood pressure 105/56, pulse 58, temperature 97.5 F (36.4 C), temperature source Oral, resp. rate 18, weight 89.359 kg (197 lb), SpO2 99 %.  GENERAL:  82 y.o.-year-old patient lying in the bed with no acute distress.  EYES: Pupils equal, round, reactive to light and accommodation. No scleral icterus. Extraocular muscles intact.  HEENT: Head atraumatic, normocephalic. Oropharynx and nasopharynx clear.  NECK:  Supple, no jugular venous distention. No thyroid enlargement, no tenderness.  LUNGS: Normal breath sounds bilaterally, no wheezing, rales,rhonchi or crepitation. No use of accessory muscles of respiration.   CARDIOVASCULAR: S1, S2 normal. No murmurs, rubs, or gallops.  ABDOMEN: Soft, nontender, nondistended. Bowel sounds present. No organomegaly or mass.  EXTREMITIES: No pedal edema, cyanosis, or clubbing. On the left foot on the medial side of his first metacarpophalangeal joint, in 3-4 cm size ulcer with surrounding redness on his all his toes and distal half of the foot present. Very weak pulsation on left foot. Left heel- black dark and dry 3-4 cm area present. NEUROLOGIC: Cranial nerves II through XII are intact. Muscle strength 4/5 in  Upper extremities 2 out of 5 in lower extremities. Sensation intact. Gait not checked.  PSYCHIATRIC: The patient is alert and oriented x 3.  SKIN: No obvious rash, lesion, or ulcer.   LABORATORY PANEL:   CBC  Recent Labs Lab 11/04/15 1105  WBC 10.4  HGB 12.3*  HCT 36.6*  PLT 182  MCV 90.1  MCH 30.4  MCHC 33.7  RDW 14.7*  LYMPHSABS 1.0  MONOABS 0.8  EOSABS 0.1  BASOSABS  0.0   ------------------------------------------------------------------------------------------------------------------  Chemistries   Recent Labs Lab 11/04/15 1105  NA 138  K 3.9  CL 101  CO2 29  GLUCOSE 164*  BUN 24*  CREATININE 1.01  CALCIUM 8.7*   ------------------------------------------------------------------------------------------------------------------ estimated creatinine clearance is 55.5 mL/min (by C-G formula based on Cr of 1.01). ------------------------------------------------------------------------------------------------------------------ No results for input(s): TSH, T4TOTAL, T3FREE, THYROIDAB in the last 72 hours.  Invalid input(s): FREET3   Coagulation profile No results for input(s): INR, PROTIME in the last 168 hours. ------------------------------------------------------------------------------------------------------------------- No results for input(s): DDIMER in the last 72  hours. -------------------------------------------------------------------------------------------------------------------  Cardiac Enzymes No results for input(s): CKMB, TROPONINI, MYOGLOBIN in the last 168 hours.  Invalid input(s): CK ------------------------------------------------------------------------------------------------------------------ Invalid input(s): POCBNP  ---------------------------------------------------------------------------------------------------------------  Urinalysis    Component Value Date/Time   APPEARANCEUR Cloudy* 09/27/2015 1506   GLUCOSEU Negative 09/27/2015 1506   BILIRUBINUR Negative 09/27/2015 1506   PROTEINUR 1+* 09/27/2015 1506   NITRITE Positive* 09/27/2015 1506   LEUKOCYTESUR 2+* 09/27/2015 1506     RADIOLOGY: Dg Foot Complete Left  11/04/2015  CLINICAL DATA:  Medial forefoot and medial heel ulcers. EXAM: LEFT FOOT - COMPLETE 3+ VIEW COMPARISON:  07/22/2015 FINDINGS: Marked hallux valgus deformity identified. Status post osteotomy of the distal aspect of the fifth metatarsal. Soft tissue ulceration overlying in the first MTP joint is identified. No underlying bone erosion identified. Hammertoe deformities are present. Diffuse soft tissue swelling. IMPRESSION: 1. Soft tissue ulceration overlying the first MTP joint. No underlying bone erosion noted. 2. Chronic changes as above Electronically Signed   By: Signa Kell M.D.   On: 11/04/2015 12:11    EKG: Orders placed or performed in visit on 10/28/15  . EKG 12-Lead    IMPRESSION AND PLAN:  * Diabetic foot with peripheral vascular disease   We will give IV vancomycin and Zosyn for now.   No finding of osteomyelitis on x-ray.   Vascular and podiatry consult for further management.  * Diabetes   We'll keep on insulin sliding scale coverage for now.  * Peripheral arterial disease   Vascular consult, may need amputation.  * Parkinson's disease   Continue home medications.  *  Coronary artery disease    We'll continue aspirin and Plavix and amiodarone.   All the records are reviewed and case discussed with ED provider. Management plans discussed with the patient, family and they are in agreement.  CODE STATUS: DO NOT RESUSCITATE, confirmed with her daughter who is power of attorney.     Code Status Orders        Start     Ordered   11/04/15 1243  Do not attempt resuscitation (DNR)   Continuous    Question Answer Comment  In the event of cardiac or respiratory ARREST Do not call a "code blue"   In the event of cardiac or respiratory ARREST Do not perform Intubation, CPR, defibrillation or ACLS   In the event of cardiac or respiratory ARREST Use medication by any route, position, wound care, and other measures to relive pain and suffering. May use oxygen, suction and manual treatment of airway obstruction as needed for comfort.   Comments confirmed with pt and hisdaughter.      11/04/15 1244    Code Status History    Date Active Date Inactive Code Status Order ID Comments User Context   This patient has a current code status but no historical code status.       TOTAL TIME TAKING CARE OF THIS PATIENT: 50 minutes.  Altamese Dilling M.D on 11/04/2015   Between 7am to 6pm - Pager - [phone]  After 6pm go to [email or web address] - password [email or web address]  [facility]  Office  [phone]  CC: Primary care physician; Vonita Moss, MD   Note: This dictation was prepared with Dragon dictation along with smaller phrase technology. Any transcriptional errors that result from this process are unintentional.

## 2015-11-04 NOTE — Progress Notes (Signed)
Dr Orland Jarredroxler called and gave orders for NPO after midnight and consent.  He will meet with the family and patient face to face tomorrow and talk with them again about the surgery.  Consents will be signed at that time

## 2015-11-04 NOTE — ED Notes (Signed)
Patient had possible stent placed in left leg to help with circulation, has helped with leg but not to foot.  Patient has large wound to left medial foot near great toe and heel.  Necrosis noted to left heel.

## 2015-11-04 NOTE — Care Management Note (Addendum)
Case Management Note  Patient Details  Name: [name] MRN: [id number] Date of Birth: 09/22/1924  Subjective/Objective:                  Met with patient and his daughter Dorian Heckle to discuss discharge planning. Patient lives with son. Right now, patient is not considering going to SNF. He is open to [facility] home health and has private duty through [employer]. He states he does not ambulate and uses a wheelchair. He struggles to ride in a car too. He has left lower leg amputation and pending surgery/cultures on other leg. He uses [employer] for transportation.  Action/Plan: I have notified Santiago Glad with [facility] of patient admission. RNCM to follow for wound VAC, IV ABX and home health.   Expected Discharge Date:                  Expected Discharge Plan:     In-House Referral:     Discharge planning Services  CM Consult  Post Acute Care Choice:  Home Health, Durable Medical Equipment Choice offered to:  Patient  DME Arranged:  Vac DME Agency:     HH Arranged:    HH Agency:  [facility]  Status of Service:  In process, will continue to follow  Medicare Important Message Given:    Date Medicare IM Given:    Medicare IM give by:    Date Additional Medicare IM Given:    Additional Medicare Important Message give by:     If discussed at [place] of Stay Meetings, dates discussed:    Additional Comments:  Marshell Garfinkel, RN 11/04/2015, 2:46 PM

## 2015-11-04 NOTE — Consult Note (Signed)
Pharmacy Antibiotic Note  Henry Russellmer K Eisenhardt is a 82 y.o. male admitted on 11/04/2015 with diabetic foot.  Pharmacy has been consulted for vancomycin and zosyn dosing.  Plan: Pt received 1g of vancomycin in the ED. Will start maintenace dose 6 hours later for stacked dosing. Vancomycin 750 IV every 12 hours.  Goal trough 15-20 mcg/mL. Zosyn 3.375g IV q8h (4 hour infusion).  Will draw trough at steady state 6/4 @ 0500.  Weight: 197 lb (89.359 kg)  Temp (24hrs), Avg:97.5 F (36.4 C), Min:97.5 F (36.4 C), Max:97.5 F (36.4 C)   Recent Labs Lab 11/04/15 1105  WBC 10.4  CREATININE 1.01    Estimated Creatinine Clearance: 55.5 mL/min (by C-G formula based on Cr of 1.01).    No Known Allergies  Antimicrobials this admission: vancomycin 6/2 >>  zosyn 6/2 >>   Dose adjustments this admission:   Microbiology results: Recent Results (from the past 240 hour(s))  WOUND CULTURE ([facility] ONLY)     Status: None (Preliminary result)   Collection Time: 11/01/15  1:20 PM  Result Value Ref Range Status   Specimen Description HEEL  Final   Special Requests LEFT  Final   Gram Stain RARE WBC SEEN NO ORGANISMS SEEN   Final   Culture   Final    LIGHT GROWTH PROTEUS MIRABILIS LIGHT GROWTH METHICILLIN RESISTANT STAPHYLOCOCCUS AUREUS HOLDING FOR ADDITIONAL POSSIBLE PATHOGEN    Report Status PENDING  Incomplete   Organism ID, Bacteria PROTEUS MIRABILIS  Final   Organism ID, Bacteria METHICILLIN RESISTANT STAPHYLOCOCCUS AUREUS  Final      Susceptibility   Proteus mirabilis - MIC*    AMPICILLIN <=2 SENSITIVE Sensitive     CEFAZOLIN <=4 SENSITIVE Sensitive     CEFEPIME <=1 SENSITIVE Sensitive     CEFTAZIDIME <=1 SENSITIVE Sensitive     CEFTRIAXONE <=1 SENSITIVE Sensitive     CIPROFLOXACIN <=0.25 SENSITIVE Sensitive     GENTAMICIN <=1 SENSITIVE Sensitive     IMIPENEM 2 SENSITIVE Sensitive     TRIMETH/SULFA <=20 SENSITIVE Sensitive     AMPICILLIN/SULBACTAM <=2 SENSITIVE Sensitive     PIP/TAZO  <=4 SENSITIVE Sensitive     * LIGHT GROWTH PROTEUS MIRABILIS   Methicillin resistant staphylococcus aureus - MIC*    CIPROFLOXACIN >=8 RESISTANT Resistant     ERYTHROMYCIN >=8 RESISTANT Resistant     GENTAMICIN <=0.5 SENSITIVE Sensitive     OXACILLIN >=4 RESISTANT Resistant     TETRACYCLINE 2 SENSITIVE Sensitive     VANCOMYCIN 1 SENSITIVE Sensitive     TRIMETH/SULFA <=10 SENSITIVE Sensitive     CLINDAMYCIN >=8 RESISTANT Resistant     RIFAMPIN <=0.5 SENSITIVE Sensitive     Inducible Clindamycin NEGATIVE Sensitive     * LIGHT GROWTH METHICILLIN RESISTANT STAPHYLOCOCCUS AUREUS     Thank you for allowing pharmacy to be a part of this patient's care.  Olene FlossMelissa D [place], Pharm.D Clinical Pharmacist   11/04/2015 12:22 PM

## 2015-11-04 NOTE — Consult Note (Signed)
Patient Demographics  [name], is a 83 y.o. male   MRN: [id number]   DOB - 03/19/1925  Admit Date - 11/04/2015    Outpatient Primary MD for the patient is Vonita Moss, MD  Consult requested in the Hospital by Altamese Dilling, MD, On 11/04/2015    Reason for consult chronic ulceration to the left great toe joint region. Patient is diabetic and has a history of peripheral arterial disease as well. Also history of neuropathy   With History of -  Past Medical History  Diagnosis Date  . Coronary artery disease   . Diabetes mellitus without complication (HCC)   . Aortic stenosis   . Hypertension   . Hyperlipidemia   . TIA (transient ischemic attack)   . Thyroid disease   . Hypothyroidism       Past Surgical History  Procedure Laterality Date  . Appendectomy    . Hernia repair    . Knee surgery      bilateral  . Eye surgery    . Colonoscopy    . Cardiac catheterization  2010    showed a patient circumflex stent with noncritical CAD and normal right-sided pressures.  . Coronary angioplasty  01/24/2007    stent placement to the mid circumflex   . Knee surgery Left   . Peripheral vascular catheterization N/A 08/30/2015    Procedure: Abdominal Aortogram w/Lower Extremity;  Surgeon: Renford Dills, MD;  Location: [facility] INVASIVE CV LAB;  Service: Cardiovascular;  Laterality: N/A;  . Peripheral vascular catheterization  08/30/2015    Procedure: Lower Extremity Intervention;  Surgeon: Renford Dills, MD;  Location: [facility] INVASIVE CV LAB;  Service: Cardiovascular;;    in for   Chief Complaint  Patient presents with  . Foot Pain     HPI  [name]  is a 82 y.o. male, Patient had a wound on the left great toe joint for several months. He's been followed at the wound care center. An  angioplasty done by Dr. Lorretta Harp in late March. According to the patient's family and also Dr. Eustace Quail notes there was some benefit the larger vessels but smaller vessels still have quite a bit of disease. Wound has progressed and not healed any all. Currently there is increased redness and some drainage from the wound at this timeframe. This prompted them to come to the emergency room.    Review of Systems : He is alert and well-oriented is with his family.   In addition to the HPI above,  No Fever-chills, No Headache, No changes with Vision or hearing, No problems swallowing food or Liquids, No Chest pain, Cough or Shortness of Breath, No Abdominal pain, No Nausea or Vommitting, Bowel movements are regular, No Blood in stool or Urine, No dysuria, No new skin rashes or bruises, No new joints pains-aches, patient had knee surgery a number of years ago and apparently had a problem with a ruptured patellar tendon and has not had functionality that knee for quite a duration of  time. The knee stays in a flexed position. No new weakness, tingling, numbness in any extremity, No recent weight gain or loss, No polyuria, polydypsia or polyphagia, No significant Mental Stressors.  A full 10 point Review of Systems was done, except as stated above, all other Review of Systems were negative.   Social History Social History  Substance Use Topics  . Smoking status: Never Smoker   . Smokeless tobacco: Never Used  . Alcohol Use: No    Family History Family History  Problem Relation Age of Onset  . Heart disease Mother   . Heart attack Father 2    MI  . Heart attack Brother 56    MI    Prior to Admission medications   Medication Sig Start Date End Date Taking? Authorizing Provider  amiodarone (PACERONE) 200 MG tablet TAKE ONE-HALF TABLET TWICE A DAY 09/09/15  Yes Antonieta Iba, MD  aspirin EC 81 MG tablet Take 1 tablet (81 mg total) by mouth daily. 08/30/15  Yes Renford Dills, MD    carbidopa-levodopa (SINEMET IR) 25-100 MG per tablet Take 1 tablet by mouth 4 (four) times daily.   Yes Historical Provider, MD  Carbidopa-Levodopa ER (SINEMET CR) 25-100 MG tablet controlled release Take 1 tablet by mouth 2 (two) times daily.   Yes Historical Provider, MD  clopidogrel (PLAVIX) 75 MG tablet TAKE 1 TABLET EVERY DAY 10/17/15  Yes Antonieta Iba, MD  cyanocobalamin (,VITAMIN B-12,) 1000 MCG/ML injection Inject 1 mL (1,000 mcg total) into the muscle every 30 (thirty) days. 1 injection in 2 weeks then every 30 days 09/19/15  Yes Steele Sizer, MD  furosemide (LASIX) 40 MG tablet Take 1 tablet (40 mg total) by mouth 2 (two) times daily. 07/29/15  Yes Antonieta Iba, MD  levothyroxine (SYNTHROID, LEVOTHROID) 150 MCG tablet Take 150 mcg by mouth daily before breakfast.   Yes Historical Provider, MD  potassium chloride (K-DUR,KLOR-CON) 10 MEQ tablet Take 10 mEq by mouth daily.  10/07/15  Yes Historical Provider, MD  pramipexole (MIRAPEX) 1 MG tablet Take 1/2 tablet four times daily.   Yes Historical Provider, MD  sitaGLIPtin (JANUVIA) 100 MG tablet Take 1 tablet (100 mg total) by mouth daily. 07/11/15  Yes Steele Sizer, MD  tamsulosin (FLOMAX) 0.4 MG CAPS capsule Take 1 capsule (0.4 mg total) by mouth daily after supper. 10/17/15  Yes Steele Sizer, MD    Anti-infectives    Start     Dose/Rate Route Frequency Ordered Stop   11/04/15 1730  vancomycin (VANCOCIN) IVPB 750 mg/150 ml premix     750 mg 150 mL/hr over 60 Minutes Intravenous Every 12 hours 11/04/15 1400     11/04/15 1530  piperacillin-tazobactam (ZOSYN) IVPB 3.375 g     3.375 g 12.5 mL/hr over 240 Minutes Intravenous Every 8 hours 11/04/15 1400     11/04/15 1100  vancomycin (VANCOCIN) IVPB 1000 mg/200 mL premix     1,000 mg 200 mL/hr over 60 Minutes Intravenous  Once 11/04/15 1048 11/04/15 1316   11/04/15 1100  piperacillin-tazobactam (ZOSYN) IVPB 3.375 g     3.375 g 100 mL/hr over 30 Minutes Intravenous  Once 11/04/15  1054 11/04/15 1316      Scheduled Meds: . [START ON 11/05/2015] amiodarone  200 mg Oral Daily  . [START ON 11/05/2015] aspirin EC  81 mg Oral Daily  . carbidopa-levodopa  1 tablet Oral QID  . Carbidopa-Levodopa ER  1 tablet Oral BID  . [START ON 11/05/2015]  clopidogrel  75 mg Oral Daily  . [START ON 12/03/2015] cyanocobalamin  1,000 mcg Intramuscular Q30 days  . heparin  5,000 Units Subcutaneous Q8H  . insulin aspart  0-9 Units Subcutaneous TID WC  . [START ON 11/05/2015] levothyroxine  150 mcg Oral QAC breakfast  . piperacillin-tazobactam (ZOSYN)  IV  3.375 g Intravenous Q8H  . [START ON 11/05/2015] potassium chloride  10 mEq Oral Daily  . pramipexole  0.5 mg Oral TID  . tamsulosin  0.4 mg Oral QPC supper  . vancomycin  750 mg Intravenous Q12H   No Known Allergies  Physical Exam  Vitals  Blood pressure 147/69, pulse 79, temperature 97.4 F (36.3 C), temperature source Oral, resp. rate 18, weight 89.359 kg (197 lb), SpO2 98 %.  Lower Extremity exam:  Vascular:DP PT pulses to the left foot are nonpalpable. We used a hand-held Doppler and curetted. A somewhat monophasic pulse flow to the posterior tibial artery. I did not find the anterior tibial artery but the nursing staff said that earlier they listened and could hear the 2 tibial artery. This with a hand-held Doppler. Patient has decent color to his toes this point there is no cyanosis. Does have delayed capillary refill time to these areas.  Dermatological: Large wound on the medial first metatarsophalangeal joint left foot with proximally 4 cm in length and 2-1/2 cm in width. It is a full-thickness ulceration with exposed metatarsal head in the wound itself. The deeper tissues including capsular tissue is somewhat necrotic in the region. Also an ulceration on the medial left heel which is approximately 2 cm diameter with centralized necrosis as well as. This appears to penetrate down to the fatty tissue at this timeframe.  Neurological:  Patient does have diabetic peripheral neuropathy.  Ortho: Likely osteomyelitis in the first metatarsal head. X-rays reviewed and do not definitively show metatarsal head breakdown but there is some lucency in the first metatarsal head medially and then the base of the proximal phalanx medially. Patient also has a very prominent metatarsal head particular the fourth on the plantar left foot consistent with a chronic preoperative and hyperkeratotic area that he gets from pushing without foot. Causes knee is flexed he tends per all the pressure on the ball of his foot whenever he utilizes it. Nonambulatory at this point but stands at the pivot from chair to the bed.  Data Review  CBC  Recent Labs Lab 11/04/15 1105  WBC 10.4  HGB 12.3*  HCT 36.6*  PLT 182  MCV 90.1  MCH 30.4  MCHC 33.7  RDW 14.7*  LYMPHSABS 1.0  MONOABS 0.8  EOSABS 0.1  BASOSABS 0.0   ------------------------------------------------------------------------------------------------------------------  Chemistries   Recent Labs Lab 11/04/15 1105  NA 138  K 3.9  CL 101  CO2 29  GLUCOSE 164*  BUN 24*  CREATININE 1.01  CALCIUM 8.7*   ---- ---------------------------------------------------------------------------------------------------------------  Urinalysis    Component Value Date/Time   APPEARANCEUR Cloudy* 09/27/2015 1506   GLUCOSEU Negative 09/27/2015 1506   BILIRUBINUR Negative 09/27/2015 1506   PROTEINUR 1+* 09/27/2015 1506   NITRITE Positive* 09/27/2015 1506   LEUKOCYTESUR 2+* 09/27/2015 1506     Imaging results:   Dg Foot Complete Left  11/04/2015  CLINICAL DATA:  Medial forefoot and medial heel ulcers. EXAM: LEFT FOOT - COMPLETE 3+ VIEW COMPARISON:  07/22/2015 FINDINGS: Marked hallux valgus deformity identified. Status post osteotomy of the distal aspect of the fifth metatarsal. Soft tissue ulceration overlying in the first MTP joint is identified. No  underlying bone erosion identified.  Hammertoe deformities are present. Diffuse soft tissue swelling. IMPRESSION: 1. Soft tissue ulceration overlying the first MTP joint. No underlying bone erosion noted. 2. Chronic changes as above Electronically Signed   By: Signa Kellaylor  Stroud M.D.   On: 11/04/2015 12:11   Assessment & Plan: Chronic long-standing ulceration to the medial first metatarsal head and also the medial left heel. Because of the size of the lesion and the involvement with bone I think the best option for him from my perspective surgically is an amputation of the first ray. This would also be coming by debridement of the medial heel ulceration with likely wound VAC application. Concern is definitely present for stability to heal since the angioplasty results were less than ideal. Subsequently I have asked Dr. due to take a look at the patient and offer his opinion on which direction to go. Determination as to whether or not to do foot surgery or to go straight to  an above-knee amputation is in large part dependent upon what the family wants to do. I think there is at least a 50-50 chance that the foot would heal based on the quality of the skin at this point. Problem to is the fact that his got a nonfunctional limb on that left side anyway and in a lot of respects would make sense just to go ahead and do the above-knee amputation. It is a difficult decision but one the family needs to try to make. If they decide to pursue surgery on the foot and try to set something up this weekend.  Active Problems:   Diabetic foot [facility](HCC)  Family Communication: Plan discussed with patient and family  Epimenio SarinROXLER,Cristalle Rohm G M.D on 11/04/2015 at 2:54 PM  Thank you for the consult, we will follow the patient with you in the Hospital.

## 2015-11-05 ENCOUNTER — Inpatient Hospital Stay: Payer: PPO | Admitting: Certified Registered Nurse Anesthetist

## 2015-11-05 ENCOUNTER — Encounter: Payer: Self-pay | Admitting: Podiatry

## 2015-11-05 ENCOUNTER — Encounter
Admission: EM | Disposition: A | Discharge status: Skilled Nursing Facility | Payer: Self-pay | Source: Home / Self Care | Attending: Internal Medicine

## 2015-11-05 HISTORY — PX: APPLICATION OF WOUND VAC: SHX5189

## 2015-11-05 HISTORY — PX: AMPUTATION TOE: SHX6595

## 2015-11-05 HISTORY — PX: IRRIGATION AND DEBRIDEMENT FOOT: SHX6602

## 2015-11-05 LAB — BASIC METABOLIC PANEL
ANION GAP: 7 (ref 5–15)
BUN: 21 mg/dL — ABNORMAL HIGH (ref 6–20)
CHLORIDE: 105 mmol/L (ref 101–111)
CO2: 26 mmol/L (ref 22–32)
Calcium: 8.4 mg/dL — ABNORMAL LOW (ref 8.9–10.3)
Creatinine, Ser: 1 mg/dL (ref 0.61–1.24)
GFR calc non Af Amer: >60 mL/min (ref >60)
Glucose, Bld: 199 mg/dL — ABNORMAL HIGH (ref 65–99)
POTASSIUM: 3.9 mmol/L (ref 3.5–5.1)
Sodium: 138 mmol/L (ref 135–145)

## 2015-11-05 LAB — CBC
HEMATOCRIT: 33.9 % — AB (ref 40.0–52.0)
HEMOGLOBIN: 11.6 g/dL — AB (ref 13.0–18.0)
MCH: 30.4 pg (ref 26.0–34.0)
MCHC: 34.1 g/dL (ref 32.0–36.0)
MCV: 89.2 fL (ref 80.0–100.0)
PLATELETS: 169 10*3/uL (ref 150–440)
RBC: 3.81 MIL/uL — AB (ref 4.40–5.90)
RDW: 14.5 % (ref 11.5–14.5)
WBC: 10.6 10*3/uL (ref 3.8–10.6)

## 2015-11-05 LAB — GLUCOSE, CAPILLARY
GLUCOSE-CAPILLARY: 185 mg/dL — AB (ref 65–99)
Glucose-Capillary: 218 mg/dL — ABNORMAL HIGH (ref 65–99)
Glucose-Capillary: 182 mg/dL — ABNORMAL HIGH (ref 65–99)
Glucose-Capillary: 194 mg/dL — ABNORMAL HIGH (ref 65–99)

## 2015-11-05 SURGERY — AMPUTATION, TOE
Anesthesia: Monitor Anesthesia Care | Laterality: Left

## 2015-11-05 MED ORDER — GUAIFENESIN-DM 100-10 MG/5ML PO SYRP
5.0000 mL | ORAL_SOLUTION | ORAL | Status: DC | PRN
  Administered 2015-11-06 – 2015-11-09 (×4): 5 mL via ORAL
  Filled 2015-11-05 (×4): qty 5

## 2015-11-05 MED ORDER — OXYCODONE-ACETAMINOPHEN 5-325 MG PO TABS
1.0000 | ORAL_TABLET | ORAL | Status: DC | PRN
  Administered 2015-11-05 – 2015-11-08 (×5): 1 via ORAL
  Filled 2015-11-05 (×7): qty 1

## 2015-11-05 MED ORDER — PROPOFOL 10 MG/ML IV BOLUS
INTRAVENOUS | Status: DC | PRN
  Administered 2015-11-05: 50 mg via INTRAVENOUS

## 2015-11-05 MED ORDER — GENTAMICIN SULFATE 40 MG/ML IJ SOLN
INTRAMUSCULAR | Status: AC
  Filled 2015-11-05: qty 4

## 2015-11-05 MED ORDER — LACTATED RINGERS IV SOLN
INTRAVENOUS | Status: DC | PRN
  Administered 2015-11-05: 12:00:00 via INTRAVENOUS

## 2015-11-05 MED ORDER — BUPIVACAINE HCL (PF) 0.5 % IJ SOLN
INTRAMUSCULAR | Status: AC
  Filled 2015-11-05: qty 30

## 2015-11-05 MED ORDER — BUPIVACAINE HCL 0.5 % IJ SOLN
INTRAMUSCULAR | Status: DC | PRN
  Administered 2015-11-05: 5 mL

## 2015-11-05 MED ORDER — LORATADINE 10 MG PO TABS
10.0000 mg | ORAL_TABLET | Freq: Every day | ORAL | Status: DC
  Administered 2015-11-05 – 2015-11-09 (×5): 10 mg via ORAL
  Filled 2015-11-05 (×5): qty 1

## 2015-11-05 MED ORDER — FENTANYL CITRATE (PF) 100 MCG/2ML IJ SOLN: 25.0000 ug | INTRAMUSCULAR | Status: DC | PRN

## 2015-11-05 MED ORDER — FENTANYL CITRATE (PF) 100 MCG/2ML IJ SOLN
INTRAMUSCULAR | Status: DC | PRN
  Administered 2015-11-05: 50 ug via INTRAVENOUS

## 2015-11-05 MED ORDER — PROPOFOL 500 MG/50ML IV EMUL
INTRAVENOUS | Status: DC | PRN
  Administered 2015-11-05: 100 ug/kg/min via INTRAVENOUS

## 2015-11-05 MED ORDER — LIDOCAINE HCL 1 % IJ SOLN
INTRAMUSCULAR | Status: DC | PRN
  Administered 2015-11-05: 5 mL

## 2015-11-05 MED ORDER — ONDANSETRON HCL 4 MG/2ML IJ SOLN: 4.0000 mg | Freq: Once | INTRAMUSCULAR | Status: DC | PRN

## 2015-11-05 MED ORDER — VANCOMYCIN HCL 1000 MG IV SOLR
INTRAVENOUS | Status: AC
  Filled 2015-11-05: qty 1000

## 2015-11-05 MED ORDER — LIDOCAINE HCL (PF) 1 % IJ SOLN
INTRAMUSCULAR | Status: AC
  Filled 2015-11-05: qty 30

## 2015-11-05 MED ORDER — GENTAMICIN SULFATE 40 MG/ML IJ SOLN
INTRAMUSCULAR | Status: AC
  Filled 2015-11-05: qty 2

## 2015-11-05 SURGICAL SUPPLY — 51 items
BAG COUNTER SPONGE EZ (MISCELLANEOUS) ×2 IMPLANT
BANDAGE ELASTIC 3 LF NS (GAUZE/BANDAGES/DRESSINGS) ×3 IMPLANT
BANDAGE ELASTIC 4 LF NS (GAUZE/BANDAGES/DRESSINGS) ×3 IMPLANT
BANDAGE STRETCH 3X4.1 STRL (GAUZE/BANDAGES/DRESSINGS) ×3 IMPLANT
BLADE MED AGGRESSIVE (BLADE) ×3 IMPLANT
BLADE SURG 15 STRL LF DISP TIS (BLADE) ×4 IMPLANT
BLADE SURG 15 STRL SS (BLADE) ×8
BNDG ESMARK 4X12 TAN STRL LF (GAUZE/BANDAGES/DRESSINGS) ×3 IMPLANT
BNDG GAUZE 4.5X4.1 6PLY STRL (MISCELLANEOUS) ×3 IMPLANT
CANISTER SUCT 1200ML W/VALVE (MISCELLANEOUS) ×3 IMPLANT
COUNTER SPONGE BAG EZ (MISCELLANEOUS) ×1
CUFF TOURN 18 STER (MISCELLANEOUS) ×3 IMPLANT
CUFF TOURN DUAL PL 12 NO SLV (MISCELLANEOUS) ×3 IMPLANT
DRAPE FLUOR MINI C-ARM 54X84 (DRAPES) ×3 IMPLANT
DRSG VAC ATS MED SENSATRAC (GAUZE/BANDAGES/DRESSINGS) ×3 IMPLANT
DURAPREP 26ML APPLICATOR (WOUND CARE) ×3 IMPLANT
ELECT REM PT RETURN 9FT ADLT (ELECTROSURGICAL) ×3
ELECTRODE REM PT RTRN 9FT ADLT (ELECTROSURGICAL) ×1 IMPLANT
GAUZE PETRO XEROFOAM 1X8 (MISCELLANEOUS) ×3 IMPLANT
GAUZE SPONGE 4X4 12PLY STRL (GAUZE/BANDAGES/DRESSINGS) ×3 IMPLANT
GAUZE XEROFORM 4X4 STRL (GAUZE/BANDAGES/DRESSINGS) ×3 IMPLANT
GLOVE BIO SURGEON STRL SZ8 (GLOVE) ×3 IMPLANT
GLOVE INDICATOR 7.5 STRL GRN (GLOVE) ×3 IMPLANT
GOWN STRL REUS W/ TWL LRG LVL3 (GOWN DISPOSABLE) ×2 IMPLANT
GOWN STRL REUS W/TWL LRG LVL3 (GOWN DISPOSABLE) ×4
KIT RM TURNOVER STRD PROC AR (KITS) ×3 IMPLANT
KIT STIMULAN RAPID CURE 5CC (Orthopedic Implant) ×3 IMPLANT
LABEL OR SOLS (LABEL) ×3 IMPLANT
NDL SAFETY 18GX1.5 (NEEDLE) ×3 IMPLANT
NEEDLE FILTER BLUNT 18X 1/2SAF (NEEDLE) ×2
NEEDLE FILTER BLUNT 18X1 1/2 (NEEDLE) ×1 IMPLANT
NEEDLE HYPO 25X1 1.5 SAFETY (NEEDLE) ×9 IMPLANT
NS IRRIG 500ML POUR BTL (IV SOLUTION) ×3 IMPLANT
PACK EXTREMITY ARMC (MISCELLANEOUS) ×3 IMPLANT
PENCIL ELECTRO HAND CTR (MISCELLANEOUS) IMPLANT
RASP SM TEAR CROSS CUT (RASP) ×3 IMPLANT
STOCKINETTE STRL 6IN 960660 (GAUZE/BANDAGES/DRESSINGS) ×3 IMPLANT
SUT ETH BLK MONO 3 0 FS 1 12/B (SUTURE) ×3 IMPLANT
SUT ETHILON 3-0 FS-10 30 BLK (SUTURE) ×3
SUT ETHILON 4-0 (SUTURE) ×2
SUT ETHILON 4-0 FS2 18XMFL BLK (SUTURE) ×1
SUT ETHILON 5 0 PS 2 18 (SUTURE) ×3 IMPLANT
SUT VIC AB 3-0 SH 27 (SUTURE) ×2
SUT VIC AB 3-0 SH 27X BRD (SUTURE) ×1 IMPLANT
SUT VIC AB 4-0 FS2 27 (SUTURE) ×3 IMPLANT
SUTURE EHLN 3-0 FS-10 30 BLK (SUTURE) ×1 IMPLANT
SUTURE ETHLN 4-0 FS2 18XMF BLK (SUTURE) ×1 IMPLANT
SWAB CULTURE AMIES ANAERIB BLU (MISCELLANEOUS) ×3 IMPLANT
SYR 3ML LL SCALE MARK (SYRINGE) ×6 IMPLANT
SYRINGE 10CC LL (SYRINGE) ×3 IMPLANT
WND VAC CANISTER 500ML (MISCELLANEOUS) ×3 IMPLANT

## 2015-11-05 NOTE — Progress Notes (Signed)
Dr Mordecai RasmussenVanStavern notified of fingerstick blood sugar of 182. No new orders given.

## 2015-11-05 NOTE — Progress Notes (Signed)
Dr.Van Aurelio BrashStavern stated to hold amiodarone prior to surgery.

## 2015-11-05 NOTE — H&P (Signed)
H and P has been reviewed and no changes are noted.  

## 2015-11-05 NOTE — Anesthesia Preprocedure Evaluation (Signed)
Anesthesia Evaluation  Patient identified by MRN, date of birth, ID band Patient awake    Reviewed: Allergy & Precautions, NPO status , Patient's Chart, lab work & pertinent test results  Airway Mallampati: II       Dental  (+) Teeth Intact   Pulmonary neg pulmonary ROS,    breath sounds clear to auscultation       Cardiovascular Exercise Tolerance: Poor hypertension, Pt. on medications + CAD and +CHF   Rhythm:Regular Rate:Normal     Neuro/Psych TIA   GI/Hepatic negative GI ROS, Neg liver ROS,   Endo/Other  diabetes, Type 2, Oral Hypoglycemic AgentsHypothyroidism   Renal/GU negative Renal ROS     Musculoskeletal   Abdominal Normal abdominal exam  (+)   Peds  Hematology   Anesthesia Other Findings   Reproductive/Obstetrics                             Anesthesia Physical Anesthesia Plan  ASA: III  Anesthesia Plan: MAC   Post-op Pain Management:    Induction: Intravenous  Airway Management Planned: Natural Airway and Nasal Cannula  Additional Equipment:   Intra-op Plan:   Post-operative Plan:   Informed Consent: I have reviewed the patients History and Physical, chart, labs and discussed the procedure including the risks, benefits and alternatives for the proposed anesthesia with the patient or authorized representative who has indicated his/her understanding and acceptance.     Plan Discussed with: CRNA  Anesthesia Plan Comments:         Anesthesia Quick Evaluation

## 2015-11-05 NOTE — Transfer of Care (Signed)
Immediate Anesthesia Transfer of Care Note  Patient: [name]  Procedure(s) Performed: Procedure(s): AMPUTATION TOE (Left) IRRIGATION AND DEBRIDEMENT FOOT / HEEL ULCER (Left) APPLICATION OF WOUND VAC (Left)  Patient Location: PACU  Anesthesia Type:General  Level of Consciousness: Alert, Awake, Oriented  Airway & Oxygen Therapy: Patient Spontanous Breathing  Post-op Assessment: Report given to RN  Post vital signs: Reviewed and stable  Last Vitals:  Filed Vitals:   11/05/15 0744 11/05/15 1259  BP: 136/70 136/81  Pulse: 66 76  Temp: 36.6 C 36.3 C  Resp: 18 18    Complications: No apparent anesthesia complications

## 2015-11-05 NOTE — Op Note (Signed)
Operative note   Surgeon: Dr. Recardo EvangelistMatthew Melina Mosteller, DPM.    Assistant: None    Preop diagnosis: #1 Osteomyelitis to the first metatarsal head with chronic nonhealing wound to the first metatarsal head and phalangeal joint as well.                                  #2 chronic ulcer to the left heel   Postop diagnosis: Same    Procedure:   1. First ray amputation left foot with antibiotic bead placement.   2. Excisional debridement of necrotic ulceration from medial left heel with wound VAC application        EBL: 25 cc    Anesthesia:IV sedation delivered by anesthesia team and local anesthesia delivered by me.    Hemostasis: Ankle tourniquet 250 mmHg pressure for 20 minutes.    Specimen: Amputated great toe and first metatarsal with necrotic skin and tissue and bone. Also culture of bone for aerobic and anaerobic culture and sensitivity was retrieved and sent to the lab.    Complications: None    Operative indications: Chronic nonhealing wound to the left first metatarsal head with subsequent osteomyelitis. Also chronic nonhealing ulceration to the medial left heel with necrosis.    Procedure:  Patient was brought into the OR and placed on the operating table in thesupine position. After anesthesia was obtained theleft lower extremity was prepped and draped in usual sterile fashion.  Operative Report: At This time to his directed to the left first metatarsal phalangeal joint. A large ulceration was noted with exposed bone in the wound. An ellipse was made around the ulceration and carried out to the distal portion of the great toe where the incision was carried down to bone the IP joint was released and the proximal phalanx was also freed away from soft tissue saving some of soft tissue for possible relocation. The toe was dissected away and the metatarsal was then freed up and also the tibial and fibular sesamoids were removed in toto. First metatarsal was osteotomized approximately one  third of the way back on the metatarsal. The area was checked good clean bone was noted at this juncture. The metatarsal was dissected out and sent to pathology also. After copious irrigation the tourniquet was released to check for revascularization and good revascularization was noted to the entire incision margin. No arterial bleeders were encountered but good capillary bleeding and venous bleeding was occurring. After copious irrigation, antibiotic beads utilizing both vancomycin and gentamicin were then placed in the wound and capsule tissue was enclosed from this area using 3-0 Vicryl simple interrupted sutures. Skin was enclosed with 3-0 nylon simple interrupted horizontal mattress combination. Some redundant tissue was removed from the distal portion to remove dog ears and allow for a cleaner closure. The distal amputation site wound was closed primarily along its entirety.  This time to his directed the medial left heel where a chronic necrotic ulceration approximately 2.5 cm diameter was identified the superficial hard necrotic tissue was removed with a 15 blade and then a versa jet was utilized to remove 5 or points of necrosis and dead tissue. Once this was accomplished a wound VAC was placed on the left heel at 125 m mercury pressure continuous. Sterile compressive dressings were placed across the forefoot amputation site consisting of Xeroform gauze 4 x 4's, conform, ABDs pads and Kerlix.    Patient tolerated the procedure and anesthesia well.  Was transported from the OR to the PACU with all vital signs stable and vascular status intact. To be discharged per routine protocol.  Will follow up in approximately 1 week in the outpatient clinic.

## 2015-11-05 NOTE — Anesthesia Postprocedure Evaluation (Signed)
Anesthesia Post Note  Patient: [name]  Procedure(s) Performed: Procedure(s) (LRB): AMPUTATION TOE (Left) IRRIGATION AND DEBRIDEMENT FOOT / HEEL ULCER (Left) APPLICATION OF WOUND VAC (Left)  Patient location during evaluation: PACU Anesthesia Type: MAC Pain management: pain level controlled Vital Signs Assessment: post-procedure vital signs reviewed and stable Respiratory status: spontaneous breathing Cardiovascular status: stable Anesthetic complications: no    Last Vitals:  Filed Vitals:   11/05/15 1424 11/05/15 1523  BP: 144/66 116/52  Pulse: 70 65  Temp: 36.2 C 36 C  Resp: 20 20    Last Pain:  Filed Vitals:   11/05/15 1526  PainSc: 0-No pain                 VAN STAVEREN,Loda Bialas

## 2015-11-05 NOTE — Progress Notes (Signed)
Dr. Allena KatzPatel notified of patient's fsbs. Stated to cover insulin sliding scale.

## 2015-11-05 NOTE — Progress Notes (Signed)
[facility] - [place] at [facility]                                                                                                                                                                                            Patient Demographics   [name], is a 82 y.o. 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 Patient currently feels okay denies any chest pain or shortness of breath    Review of Systems:   CONSTITUTIONAL: No documented fever. No fatigue, weakness. No weight gain, no weight loss.  EYES: No blurry or double vision.  ENT: No tinnitus. No postnasal drip. No redness of the oropharynx.  RESPIRATORY: No cough, no wheeze, no hemoptysis. No dyspnea.  CARDIOVASCULAR: No chest pain. No orthopnea. No palpitations. No syncope.  GASTROINTESTINAL: No nausea, no vomiting or diarrhea. No abdominal pain. No melena or hematochezia.  GENITOURINARY: No dysuria or hematuria.  ENDOCRINE: No polyuria or nocturia. No heat or cold intolerance.  HEMATOLOGY: No anemia. No bruising. No bleeding.  INTEGUMENTARY: No rashes. No lesions.Wound VAC in place MUSCULOSKELETAL: No arthritis. No swelling. No gout.  NEUROLOGIC: No numbness, tingling, or ataxia. No seizure-type activity.  PSYCHIATRIC: No anxiety. No insomnia. No ADD.    Vitals:   Filed Vitals:   11/05/15 1300 11/05/15 1315 11/05/15 1330 11/05/15 1354  BP: 136/81 143/95 140/80 162/84  Pulse: 67 62 62 65  Temp:    95.9 F (35.5 C)  TempSrc:    Oral  Resp: 20 19 20 20   Height:      Weight:      SpO2: 100% 94% 96% 94%    Wt Readings from Last 3 Encounters:  11/04/15  84.913 kg (187 lb 3.2 oz)  08/30/15 89.359 kg (197 lb)  07/29/15 88.451 kg (195 lb)     Intake/Output Summary (Last 24 hours) at 11/05/15 1427 Last data filed at 11/05/15 1253  Gross per 24 hour  Intake   1240 ml  Output    575 ml  Net    665 ml    Physical Exam:   GENERAL: Pleasant-appearing in no apparent distress.  HEAD, EYES, EARS, NOSE AND THROAT: Atraumatic, normocephalic. Extraocular muscles are intact. Pupils equal and reactive to light. Sclerae anicteric. No conjunctival injection. No oro-pharyngeal erythema.  NECK: Supple. There is no jugular venous distention. No bruits, no lymphadenopathy, no thyromegaly.  HEART: Regular rate and rhythm,. No murmurs, no rubs, no clicks.  LUNGS: Clear to auscultation bilaterally. No rales or rhonchi. No wheezes.  ABDOMEN: Soft, flat, nontender, nondistended. Has good bowel sounds. No hepatosplenomegaly appreciated.  EXTREMITIES: Left lower extremity with a wound VAC  NEUROLOGIC: The patient is alert, awake, and oriented x3 with no focal motor or sensory deficits appreciated bilaterally.  SKIN: Moist and warm with no rashes appreciated.  Psych: Not anxious, depressed LN: No inguinal LN enlargement    Antibiotics   Anti-infectives    Start     Dose/Rate Route Frequency Ordered Stop   11/04/15 1730  vancomycin (VANCOCIN) IVPB 750 mg/150 ml premix     750 mg 150 mL/hr over 60 Minutes Intravenous Every 12 hours 11/04/15 1400     11/04/15 1530  piperacillin-tazobactam (ZOSYN) IVPB 3.375 g     3.375 g 12.5 mL/hr over 240 Minutes Intravenous Every 8 hours 11/04/15 1400     11/04/15 1100  vancomycin (VANCOCIN) IVPB 1000 mg/200 mL premix     1,000 mg 200 mL/hr over 60 Minutes Intravenous  Once 11/04/15 1048 11/04/15 1316   11/04/15 1100  piperacillin-tazobactam (ZOSYN) IVPB 3.375 g     3.375 g 100 mL/hr over 30 Minutes Intravenous  Once 11/04/15 1054 11/04/15 1316      Medications   Scheduled Meds: . amiodarone  200 mg Oral Daily   . aspirin EC  81 mg Oral Daily  . carbidopa-levodopa  1 tablet Oral QID  . Carbidopa-Levodopa ER  1 tablet Oral BID  . Chlorhexidine Gluconate Cloth  6 each Topical Q0600  . clopidogrel  75 mg Oral Daily  . [START ON 12/03/2015] cyanocobalamin  1,000 mcg Intramuscular Q30 days  . heparin  5,000 Units Subcutaneous Q8H  . insulin aspart  0-9 Units Subcutaneous TID WC  . levothyroxine  150 mcg Oral QAC breakfast  . mupirocin ointment  1 application Nasal BID  . piperacillin-tazobactam (ZOSYN)  IV  3.375 g Intravenous Q8H  . potassium chloride  10 mEq Oral Daily  . pramipexole  0.5 mg Oral TID  . tamsulosin  0.4 mg Oral QPC supper  . vancomycin  750 mg Intravenous Q12H   Continuous Infusions:  PRN Meds:.oxyCODONE-acetaminophen   Data Review:   Micro Results Recent Results (from the past 240 hour(s))  WOUND CULTURE ([facility] ONLY)     Status: None (Preliminary result)   Collection Time: 11/01/15  1:20 PM  Result Value Ref Range Status   Specimen Description HEEL  Final   Special Requests LEFT  Final   Gram Stain RARE WBC SEEN NO ORGANISMS SEEN   Final   Culture   Final    LIGHT GROWTH PROTEUS MIRABILIS LIGHT GROWTH METHICILLIN RESISTANT STAPHYLOCOCCUS AUREUS HOLDING FOR ADDITIONAL POSSIBLE PATHOGEN    Report Status PENDING  Incomplete   Organism ID, Bacteria PROTEUS MIRABILIS  Final   Organism ID, Bacteria METHICILLIN RESISTANT STAPHYLOCOCCUS AUREUS  Final      Susceptibility   Proteus mirabilis - MIC*    AMPICILLIN <=2 SENSITIVE Sensitive     CEFAZOLIN <=4 SENSITIVE Sensitive     CEFEPIME <=1 SENSITIVE Sensitive     CEFTAZIDIME <=1 SENSITIVE Sensitive     CEFTRIAXONE <=1 SENSITIVE Sensitive     CIPROFLOXACIN <=0.25 SENSITIVE Sensitive     GENTAMICIN <=1 SENSITIVE Sensitive     IMIPENEM 2 SENSITIVE Sensitive     TRIMETH/SULFA <=20  SENSITIVE Sensitive     AMPICILLIN/SULBACTAM <=2 SENSITIVE Sensitive     PIP/TAZO <=4 SENSITIVE Sensitive     * LIGHT GROWTH PROTEUS MIRABILIS    Methicillin resistant staphylococcus aureus - MIC*    CIPROFLOXACIN >=8 RESISTANT Resistant     ERYTHROMYCIN >=8 RESISTANT Resistant     GENTAMICIN <=0.5 SENSITIVE Sensitive     OXACILLIN >=4 RESISTANT Resistant     TETRACYCLINE 2 SENSITIVE Sensitive     VANCOMYCIN 1 SENSITIVE Sensitive     TRIMETH/SULFA <=10 SENSITIVE Sensitive     CLINDAMYCIN >=8 RESISTANT Resistant     RIFAMPIN <=0.5 SENSITIVE Sensitive     Inducible Clindamycin NEGATIVE Sensitive     * LIGHT GROWTH METHICILLIN RESISTANT STAPHYLOCOCCUS AUREUS  Surgical pcr screen     Status: Abnormal   Collection Time: 11/04/15  6:11 PM  Result Value Ref Range Status   MRSA, PCR POSITIVE (A) NEGATIVE Final    Comment: CRITICAL RESULT CALLED TO, READ BACK BY AND VERIFIED WITH: MARY RAYMOND AT 2324 11/04/15.PMH    Staphylococcus aureus POSITIVE (A) NEGATIVE Final    Comment:        The Xpert SA Assay (FDA approved for NASAL specimens in patients over 34 years of age), is one component of a comprehensive surveillance program.  Test performance has been validated by [facility] for patients greater than or equal to 12 year old. It is not intended to diagnose infection nor to guide or monitor treatment.     Radiology Reports Dg Foot Complete Left  11/04/2015  CLINICAL DATA:  Medial forefoot and medial heel ulcers. EXAM: LEFT FOOT - COMPLETE 3+ VIEW COMPARISON:  07/22/2015 FINDINGS: Marked hallux valgus deformity identified. Status post osteotomy of the distal aspect of the fifth metatarsal. Soft tissue ulceration overlying in the first MTP joint is identified. No underlying bone erosion identified. Hammertoe deformities are present. Diffuse soft tissue swelling. IMPRESSION: 1. Soft tissue ulceration overlying the first MTP joint. No underlying bone erosion noted. 2. Chronic changes as above Electronically Signed   By: Signa Kell M.D.   On: 11/04/2015 12:11     CBC  Recent Labs Lab 11/04/15 1105 11/05/15 0353  WBC 10.4  10.6  HGB 12.3* 11.6*  HCT 36.6* 33.9*  PLT 182 169  MCV 90.1 89.2  MCH 30.4 30.4  MCHC 33.7 34.1  RDW 14.7* 14.5  LYMPHSABS 1.0  --   MONOABS 0.8  --   EOSABS 0.1  --   BASOSABS 0.0  --     Chemistries   Recent Labs Lab 11/04/15 1105 11/05/15 0353  NA 138 138  K 3.9 3.9  CL 101 105  CO2 29 26  GLUCOSE 164* 199*  BUN 24* 21*  CREATININE 1.01 1.00  CALCIUM 8.7* 8.4*   ------------------------------------------------------------------------------------------------------------------ estimated creatinine clearance is 56 mL/min (by C-G formula based on Cr of 1). ------------------------------------------------------------------------------------------------------------------ No results for input(s): HGBA1C in the last 72 hours. ------------------------------------------------------------------------------------------------------------------ No results for input(s): CHOL, HDL, LDLCALC, TRIG, CHOLHDL, LDLDIRECT in the last 72 hours. ------------------------------------------------------------------------------------------------------------------ No results for input(s): TSH, T4TOTAL, T3FREE, THYROIDAB in the last 72 hours.  Invalid input(s): FREET3 ------------------------------------------------------------------------------------------------------------------ No results for input(s): VITAMINB12, FOLATE, FERRITIN, TIBC, IRON, RETICCTPCT in the last 72 hours.  Coagulation profile No results for input(s): INR, PROTIME in the last 168 hours.  No results for input(s): DDIMER in the last 72 hours.  Cardiac Enzymes No results for input(s): CKMB, TROPONINI, MYOGLOBIN in the last 168 hours.  Invalid input(s): CK ------------------------------------------------------------------------------------------------------------------ Invalid input(s):  POCBNP    Assessment & Plan  Patient is a 82 year old with a diabetic foot ulcer * Diabetic foot with peripheral vascular disease  await cultures from wound Continue Zosyn and vancomycin Continue one back   * Diabetes  We'll keep on insulin sliding scale coverage for now.  * Peripheral arterial disease  Vascular consult appreciated  * Parkinson's disease  Continue carbidopa levodopa.  * Coronary artery disease   We'll continue aspirin and Plavix and amiodarone.   All the records are reviewed and case discussed with ED provider.     Code Status Orders        Start     Ordered   11/04/15 1400  Full code   Continuous     11/04/15 1400    Code Status History    Date Active Date Inactive Code Status Order ID Comments User Context   11/04/2015 12:44 PM 11/04/2015  2:00 PM DNR [id number]  Altamese Dilling, MD ED    Advance Directive Documentation        Most Recent Value   Type of Advance Directive  Healthcare Power of Attorney   Pre-existing out of facility DNR order (yellow form or pink MOST form)     "MOST" Form in Place?             Consults  Podiatry and vascular surgery DVT Prophylaxis   Heparin  Lab Results  Component Value Date   PLT 169 11/05/2015     Time Spent in minutes   Greater than 50% of time spent in care coordination and counseling patient regarding the condition and plan of care.   Auburn Bilberry M.D on 11/05/2015 at 2:27 PM  Between 7am to 6pm - Pager - [phone]  After 6pm go to [email or web address] - password [email or web address] [facility]  [facility]   Office  [phone]

## 2015-11-06 LAB — GLUCOSE, CAPILLARY
Glucose-Capillary: 165 mg/dL — ABNORMAL HIGH (ref 65–99)
Glucose-Capillary: 189 mg/dL — ABNORMAL HIGH (ref 65–99)
Glucose-Capillary: 189 mg/dL — ABNORMAL HIGH (ref 65–99)

## 2015-11-06 LAB — VANCOMYCIN, TROUGH: VANCOMYCIN TR: 10 ug/mL (ref 10–20)

## 2015-11-06 MED ORDER — GLUCERNA SHAKE PO LIQD
237.0000 mL | Freq: Three times a day (TID) | ORAL | Status: DC
  Administered 2015-11-06 – 2015-11-09 (×9): 237 mL via ORAL

## 2015-11-06 MED ORDER — DOCUSATE SODIUM 100 MG PO CAPS
100.0000 mg | ORAL_CAPSULE | Freq: Two times a day (BID) | ORAL | Status: DC
Start: 2015-11-06 — End: 2015-11-09
  Administered 2015-11-06 – 2015-11-09 (×7): 100 mg via ORAL
  Filled 2015-11-06 (×7): qty 1

## 2015-11-06 MED ORDER — VANCOMYCIN HCL IN DEXTROSE 1-5 GM/200ML-% IV SOLN
1000.0000 mg | Freq: Two times a day (BID) | INTRAVENOUS | Status: DC
  Administered 2015-11-06 – 2015-11-07 (×4): 1000 mg via INTRAVENOUS
  Filled 2015-11-06 (×5): qty 200

## 2015-11-06 NOTE — Progress Notes (Signed)
[facility] - [place] at [facility]                                                                                                                                                                                            Patient Demographics   [name], is a 82 y.o. male, DOB - 19-Sep-1924, WUJ:[id number]  Admit date - 11/04/2015   Admitting Physician Altamese Dilling, MD  Outpatient Primary MD for the patient is Vonita Moss, MD   LOS - 2  Subjective: Complains of constipation denies any other complaints  Review of Systems:   CONSTITUTIONAL: No documented fever. No fatigue, weakness. No weight gain, no weight loss.  EYES: No blurry or double vision.  ENT: No tinnitus. No postnasal drip. No redness of the oropharynx.  RESPIRATORY: No cough, no wheeze, no hemoptysis. No dyspnea.  CARDIOVASCULAR: No chest pain. No orthopnea. No palpitations. No syncope.  GASTROINTESTINAL: No nausea, no vomiting or diarrhea. No abdominal pain. No melena or hematochezia. Positive constipation GENITOURINARY: No dysuria or hematuria.  ENDOCRINE: No polyuria or nocturia. No heat or cold intolerance.  HEMATOLOGY: No anemia. No bruising. No bleeding.  INTEGUMENTARY: No rashes. No lesions.Wound VAC in place MUSCULOSKELETAL: No arthritis. No swelling. No gout.  NEUROLOGIC: No numbness, tingling, or ataxia. No seizure-type activity.  PSYCHIATRIC: No anxiety. No insomnia. No ADD.    Vitals:   Filed Vitals:   11/05/15 1835 11/05/15 2021 11/06/15 0427 11/06/15 0740  BP: 154/68 144/58 112/60 118/50  Pulse: 70 70 63 63  Temp: 98.8 F (37.1 C) 99.8 F (37.7 C) 98.1 F (36.7 C) 98.2 F (36.8 C)  TempSrc: Oral Oral Oral Oral  Resp: 20 20 18 18   Height:      Weight:      SpO2: 96% 96% 96% 97%    Wt Readings from Last 3 Encounters:  11/04/15 84.913 kg (187 lb 3.2 oz)  08/30/15 89.359 kg (197 lb)  07/29/15 88.451 kg (195 lb)     Intake/Output Summary (Last 24 hours)  at 11/06/15 1133 Last data filed at 11/06/15 0427  Gross per 24 hour  Intake   1130 ml  Output    425 ml  Net    705 ml    Physical Exam:   GENERAL: Pleasant-appearing in no apparent distress.  HEAD, EYES, EARS, NOSE AND THROAT: Atraumatic, normocephalic. Extraocular muscles are intact. Pupils equal and reactive to light. Sclerae anicteric. No conjunctival injection. No oro-pharyngeal erythema.  NECK: Supple. There is no jugular venous distention. No bruits, no lymphadenopathy, no thyromegaly.  HEART: Regular rate and rhythm,. No murmurs, no rubs, no clicks.  LUNGS: Clear to auscultation bilaterally. No rales or rhonchi. No wheezes.  ABDOMEN: Soft, flat, nontender, nondistended. Has good bowel sounds. No hepatosplenomegaly appreciated.  EXTREMITIES: Left lower extremity with a wound VAC  NEUROLOGIC: The patient is alert, awake, and oriented x3 with no focal motor or sensory deficits appreciated bilaterally.  SKIN: Moist and warm with no rashes appreciated.  Psych: Not anxious, depressed LN: No inguinal LN enlargement    Antibiotics   Anti-infectives    Start     Dose/Rate Route Frequency Ordered Stop   11/06/15 0600  vancomycin (VANCOCIN) IVPB 1000 mg/200 mL premix     1,000 mg 200 mL/hr over 60 Minutes Intravenous Every 12 hours 11/06/15 0545     11/04/15 1730  vancomycin (VANCOCIN) IVPB 750 mg/150 ml premix  Status:  Discontinued     750 mg 150 mL/hr over 60 Minutes Intravenous Every 12 hours 11/04/15 1400 11/06/15 0545   11/04/15 1530  piperacillin-tazobactam (ZOSYN) IVPB 3.375 g     3.375 g 12.5 mL/hr over 240 Minutes Intravenous Every 8 hours 11/04/15 1400     11/04/15 1100  vancomycin (VANCOCIN) IVPB 1000 mg/200 mL premix     1,000 mg 200 mL/hr over 60 Minutes Intravenous  Once 11/04/15 1048 11/04/15 1316   11/04/15 1100  piperacillin-tazobactam (ZOSYN) IVPB 3.375 g     3.375 g 100 mL/hr over 30 Minutes Intravenous  Once 11/04/15 1054 11/04/15 1316      Medications    Scheduled Meds: . amiodarone  200 mg Oral Daily  . aspirin EC  81 mg Oral Daily  . carbidopa-levodopa  1 tablet Oral QID  . Carbidopa-Levodopa ER  1 tablet Oral BID  . Chlorhexidine Gluconate Cloth  6 each Topical Q0600  . clopidogrel  75 mg Oral Daily  . [START ON 12/03/2015] cyanocobalamin  1,000 mcg Intramuscular Q30 days  . docusate sodium  100 mg Oral BID  . heparin  5,000 Units Subcutaneous Q8H  . insulin aspart  0-9 Units Subcutaneous TID WC  . levothyroxine  150 mcg Oral QAC breakfast  . loratadine  10 mg Oral Daily  . mupirocin ointment  1 application Nasal BID  . piperacillin-tazobactam (ZOSYN)  IV  3.375 g Intravenous Q8H  . potassium chloride  10 mEq Oral Daily  . pramipexole  0.5 mg Oral TID  . tamsulosin  0.4 mg Oral QPC supper  . vancomycin  1,000 mg Intravenous Q12H   Continuous Infusions:  PRN Meds:.guaiFENesin-dextromethorphan, oxyCODONE-acetaminophen   Data Review:   Micro Results Recent Results (from the past 240 hour(s))  WOUND CULTURE ([facility] ONLY)     Status: None (Preliminary result)   Collection Time: 11/01/15  1:20 PM  Result Value Ref Range Status   Specimen Description HEEL  Final   Special Requests LEFT  Final   Gram Stain RARE WBC SEEN NO ORGANISMS SEEN   Final   Culture   Final    LIGHT GROWTH PROTEUS MIRABILIS LIGHT GROWTH METHICILLIN RESISTANT STAPHYLOCOCCUS AUREUS MODERATE GROWTH ENTEROCOCCUS FAECALIS SUSCEPTIBILITIES TO FOLLOW    Report Status PENDING  Incomplete   Organism ID, Bacteria PROTEUS MIRABILIS  Final   Organism ID, Bacteria METHICILLIN RESISTANT STAPHYLOCOCCUS AUREUS  Final      Susceptibility   Proteus mirabilis - MIC*    AMPICILLIN <=2 SENSITIVE Sensitive     CEFAZOLIN <=4 SENSITIVE Sensitive     CEFEPIME <=1 SENSITIVE Sensitive     CEFTAZIDIME <=1 SENSITIVE Sensitive     CEFTRIAXONE <=1 SENSITIVE Sensitive  CIPROFLOXACIN <=0.25 SENSITIVE Sensitive     GENTAMICIN <=1 SENSITIVE Sensitive     IMIPENEM 2 SENSITIVE  Sensitive     TRIMETH/SULFA <=20 SENSITIVE Sensitive     AMPICILLIN/SULBACTAM <=2 SENSITIVE Sensitive     PIP/TAZO <=4 SENSITIVE Sensitive     * LIGHT GROWTH PROTEUS MIRABILIS   Methicillin resistant staphylococcus aureus - MIC*    CIPROFLOXACIN >=8 RESISTANT Resistant     ERYTHROMYCIN >=8 RESISTANT Resistant     GENTAMICIN <=0.5 SENSITIVE Sensitive     OXACILLIN >=4 RESISTANT Resistant     TETRACYCLINE 2 SENSITIVE Sensitive     VANCOMYCIN 1 SENSITIVE Sensitive     TRIMETH/SULFA <=10 SENSITIVE Sensitive     CLINDAMYCIN >=8 RESISTANT Resistant     RIFAMPIN <=0.5 SENSITIVE Sensitive     Inducible Clindamycin NEGATIVE Sensitive     * LIGHT GROWTH METHICILLIN RESISTANT STAPHYLOCOCCUS AUREUS  Surgical pcr screen     Status: Abnormal   Collection Time: 11/04/15  6:11 PM  Result Value Ref Range Status   MRSA, PCR POSITIVE (A) NEGATIVE Final    Comment: CRITICAL RESULT CALLED TO, READ BACK BY AND VERIFIED WITH: MARY RAYMOND AT 2324 11/04/15.PMH    Staphylococcus aureus POSITIVE (A) NEGATIVE Final    Comment:        The Xpert SA Assay (FDA approved for NASAL specimens in patients over 65 years of age), is one component of a comprehensive surveillance program.  Test performance has been validated by [facility] for patients greater than or equal to 26 year old. It is not intended to diagnose infection nor to guide or monitor treatment.   WOUND CULTURE ([facility] ONLY)     Status: None (Preliminary result)   Collection Time: 11/05/15 12:17 PM  Result Value Ref Range Status   Specimen Description WOUND  Final   Special Requests NONE  Final   Gram Stain PENDING  Incomplete   Culture   Final    LIGHT GROWTH PROTEUS MIRABILIS SUSCEPTIBILITIES TO FOLLOW    Report Status PENDING  Incomplete    Radiology Reports Dg Foot Complete Left  11/04/2015  CLINICAL DATA:  Medial forefoot and medial heel ulcers. EXAM: LEFT FOOT - COMPLETE 3+ VIEW COMPARISON:  07/22/2015 FINDINGS: Marked hallux valgus  deformity identified. Status post osteotomy of the distal aspect of the fifth metatarsal. Soft tissue ulceration overlying in the first MTP joint is identified. No underlying bone erosion identified. Hammertoe deformities are present. Diffuse soft tissue swelling. IMPRESSION: 1. Soft tissue ulceration overlying the first MTP joint. No underlying bone erosion noted. 2. Chronic changes as above Electronically Signed   By: Signa Kell M.D.   On: 11/04/2015 12:11     CBC  Recent Labs Lab 11/04/15 1105 11/05/15 0353  WBC 10.4 10.6  HGB 12.3* 11.6*  HCT 36.6* 33.9*  PLT 182 169  MCV 90.1 89.2  MCH 30.4 30.4  MCHC 33.7 34.1  RDW 14.7* 14.5  LYMPHSABS 1.0  --   MONOABS 0.8  --   EOSABS 0.1  --   BASOSABS 0.0  --     Chemistries   Recent Labs Lab 11/04/15 1105 11/05/15 0353  NA 138 138  K 3.9 3.9  CL 101 105  CO2 29 26  GLUCOSE 164* 199*  BUN 24* 21*  CREATININE 1.01 1.00  CALCIUM 8.7* 8.4*   ------------------------------------------------------------------------------------------------------------------ estimated creatinine clearance is 56 mL/min (by C-G formula based on Cr of 1). ------------------------------------------------------------------------------------------------------------------ No results for input(s): HGBA1C in the last 72 hours. ------------------------------------------------------------------------------------------------------------------  No results for input(s): CHOL, HDL, LDLCALC, TRIG, CHOLHDL, LDLDIRECT in the last 72 hours. ------------------------------------------------------------------------------------------------------------------ No results for input(s): TSH, T4TOTAL, T3FREE, THYROIDAB in the last 72 hours.  Invalid input(s): FREET3 ------------------------------------------------------------------------------------------------------------------ No results for input(s): VITAMINB12, FOLATE, FERRITIN, TIBC, IRON, RETICCTPCT in the last 72  hours.  Coagulation profile No results for input(s): INR, PROTIME in the last 168 hours.  No results for input(s): DDIMER in the last 72 hours.  Cardiac Enzymes No results for input(s): CKMB, TROPONINI, MYOGLOBIN in the last 168 hours.  Invalid input(s): CK ------------------------------------------------------------------------------------------------------------------ Invalid input(s): POCBNP    Assessment & Plan  Patient is a 82 year old with a diabetic foot ulcer * Diabetic foot with peripheral vascular disease  wound cultures showing Proteus previous wound culture with MRSA I will ask infectious disease to see tomorrow regarding outpatient therapy   * Diabetes  We'll keep on insulin sliding scale coverage for now.Blood glucose stable  * Peripheral arterial disease  Vascular consult appreciated  * Parkinson's disease  Continue carbidopa levodopa.  * Coronary artery disease   We'll continue aspirin and Plavix and amiodarone.   *Constipation will start him on Colace  *Hypothyroidism continue Synthroid  *Miscellaneous heparin for DVT prophylaxis     Code Status Orders        Start     Ordered   11/04/15 1400  Full code   Continuous     11/04/15 1400    Code Status History    Date Active Date Inactive Code Status Order ID Comments User Context   11/04/2015 12:44 PM 11/04/2015  2:00 PM DNR [id number]  Altamese Dilling, MD ED    Advance Directive Documentation        Most Recent Value   Type of Advance Directive  Healthcare Power of Attorney   Pre-existing out of facility DNR order (yellow form or pink MOST form)     "MOST" Form in Place?             Consults  Podiatry and vascular surgery DVT Prophylaxis   Heparin  Lab Results  Component Value Date   PLT 169 11/05/2015     Time Spent in minutes   Greater than 50% of time spent in care coordination and counseling patient regarding the condition and plan of care.   Auburn Bilberry M.D on 11/06/2015 at 11:33 AM  Between 7am to 6pm - Pager - [phone]  After 6pm go to [email or web address] - password [email or web address] [facility]  [facility] Hospital[facility]   Office  [phone]

## 2015-11-06 NOTE — Progress Notes (Signed)
Wound cultures came back: "LIGHT GROWTH PROTEUS MIRABILIS  SUSCEPTIBILITIES TO FOLLOW". Dr. Allena KatzPatel notified via text paging system. Pt is currently being treated with IV zosyn and vancomycin.

## 2015-11-06 NOTE — Progress Notes (Signed)
Pt has had a poor appetite recently. States he drinks glucerna shakes at home as he needs. Dr. Allena KatzPatel called and per verbal order I will order glucerna shakes for pt.

## 2015-11-06 NOTE — Care Management Important Message (Signed)
Important Message  Patient Details  Name: [name] MRN: [id number] Date of Birth: [birth year]   Medicare Important Message Given:  Yes    Danesha Kirchoff A, RN 11/06/2015, 2:35 PM

## 2015-11-06 NOTE — Care Management Note (Signed)
Case Management Note  Patient Details  Name: Henry Russellmer K Dowson MRN: [id number] Date of Birth: Feb 25, 1925  Subjective/Objective:      A Negative Pressure Wound Therapy form was left on the front of the ghost chart for Dr Orland Jarredroxler to sign so that a home vac can be obtained for Mr Adron BeneHensley. Dr Orland Jarredroxler was notified.                Action/Plan:   Expected Discharge Date:                  Expected Discharge Plan:     In-House Referral:     Discharge planning Services  CM Consult  Post Acute Care Choice:  Home Health, Durable Medical Equipment Choice offered to:  Patient  DME Arranged:  Vac DME Agency:     HH Arranged:    HH Agency:  [facility]  Status of Service:  In process, will continue to follow  Medicare Important Message Given:    Date Medicare IM Given:    Medicare IM give by:    Date Additional Medicare IM Given:    Additional Medicare Important Message give by:     If discussed at Long Length of Stay Meetings, dates discussed:    Additional Comments:  Indiah Heyden A, RN 11/06/2015, 10:36 AM

## 2015-11-06 NOTE — Consult Note (Signed)
Pharmacy Antibiotic Note  [name] is a 83 y.o. male admitted on 11/04/2015 with diabetic foot.  Pharmacy has been consulted for vancomycin and zosyn dosing.  Plan: Pt received 1g of vancomycin in the ED. Will start maintenace dose 6 hours later for stacked dosing. Vancomycin 750 IV every 12 hours.  Goal trough 15-20 mcg/mL. Zosyn 3.375g IV q8h (4 hour infusion).  Will draw trough at steady state 6/4 @ 0500.  Height: 6' (182.9 cm) Weight: 187 lb 3.2 oz (84.913 kg) IBW/kg (Calculated) : 77.6  Temp (24hrs), Avg:97.6 F (36.4 C), Min:95.9 F (35.5 C), Max:99.8 F (37.7 C)   Recent Labs Lab 11/04/15 1105 11/05/15 0353 11/06/15 0511  WBC 10.4 10.6  --   CREATININE 1.01 1.00  --   VANCOTROUGH  --   --  10    Estimated Creatinine Clearance: 56 mL/min (by C-G formula based on Cr of 1).    No Known Allergies  Antimicrobials this admission: vancomycin 6/2 >>  zosyn 6/2 >>   Dose adjustments this admission:  6/4 AM vanc level 10. Changed to 1 gram q 12 hours. Level before 4th new dose.   Microbiology results: Recent Results (from the past 240 hour(s))  WOUND CULTURE ([facility] ONLY)     Status: None (Preliminary result)   Collection Time: 11/01/15  1:20 PM  Result Value Ref Range Status   Specimen Description HEEL  Final   Special Requests LEFT  Final   Gram Stain RARE WBC SEEN NO ORGANISMS SEEN   Final   Culture   Final    LIGHT GROWTH PROTEUS MIRABILIS LIGHT GROWTH METHICILLIN RESISTANT STAPHYLOCOCCUS AUREUS HOLDING FOR ADDITIONAL POSSIBLE PATHOGEN    Report Status PENDING  Incomplete   Organism ID, Bacteria PROTEUS MIRABILIS  Final   Organism ID, Bacteria METHICILLIN RESISTANT STAPHYLOCOCCUS AUREUS  Final      Susceptibility   Proteus mirabilis - MIC*    AMPICILLIN <=2 SENSITIVE Sensitive     CEFAZOLIN <=4 SENSITIVE Sensitive     CEFEPIME <=1 SENSITIVE Sensitive     CEFTAZIDIME <=1 SENSITIVE Sensitive     CEFTRIAXONE <=1 SENSITIVE Sensitive     CIPROFLOXACIN  <=0.25 SENSITIVE Sensitive     GENTAMICIN <=1 SENSITIVE Sensitive     IMIPENEM 2 SENSITIVE Sensitive     TRIMETH/SULFA <=20 SENSITIVE Sensitive     AMPICILLIN/SULBACTAM <=2 SENSITIVE Sensitive     PIP/TAZO <=4 SENSITIVE Sensitive     * LIGHT GROWTH PROTEUS MIRABILIS   Methicillin resistant staphylococcus aureus - MIC*    CIPROFLOXACIN >=8 RESISTANT Resistant     ERYTHROMYCIN >=8 RESISTANT Resistant     GENTAMICIN <=0.5 SENSITIVE Sensitive     OXACILLIN >=4 RESISTANT Resistant     TETRACYCLINE 2 SENSITIVE Sensitive     VANCOMYCIN 1 SENSITIVE Sensitive     TRIMETH/SULFA <=10 SENSITIVE Sensitive     CLINDAMYCIN >=8 RESISTANT Resistant     RIFAMPIN <=0.5 SENSITIVE Sensitive     Inducible Clindamycin NEGATIVE Sensitive     * LIGHT GROWTH METHICILLIN RESISTANT STAPHYLOCOCCUS AUREUS  Surgical pcr screen     Status: Abnormal   Collection Time: 11/04/15  6:11 PM  Result Value Ref Range Status   MRSA, PCR POSITIVE (A) NEGATIVE Final    Comment: CRITICAL RESULT CALLED TO, READ BACK BY AND VERIFIED WITH: MARY RAYMOND AT 2324 11/04/15.PMH    Staphylococcus aureus POSITIVE (A) NEGATIVE Final    Comment:        The Xpert SA Assay (FDA approved for  NASAL specimens in patients over 80 years of age), is one component of a comprehensive surveillance program.  Test performance has been validated by [facility] for patients greater than or equal to 80 year old. It is not intended to diagnose infection nor to guide or monitor treatment.      Thank you for allowing pharmacy to be a part of this patient's care.  Olene FlossMelissa D Maccia, Pharm.D Clinical Pharmacist   11/06/2015 5:46 AM

## 2015-11-06 NOTE — Progress Notes (Signed)
Patient Demographics  [name], is a 82 y.o. male   MRN: [id number]   DOB - August 27, 1924  Admit Date - 11/04/2015    Outpatient Primary MD for the patient is Vonita Moss, MD  Consult requested in the Hospital by Auburn Bilberry, MD, On 11/06/2015    With History of -  Past Medical History  Diagnosis Date  . Coronary artery disease   . Diabetes mellitus without complication (HCC)   . Aortic stenosis   . Hypertension   . Hyperlipidemia   . TIA (transient ischemic attack)   . Thyroid disease   . Hypothyroidism       Past Surgical History  Procedure Laterality Date  . Appendectomy    . Hernia repair    . Knee surgery      bilateral  . Eye surgery    . Colonoscopy    . Cardiac catheterization  2010    showed a patient circumflex stent with noncritical CAD and normal right-sided pressures.  . Coronary angioplasty  01/24/2007    stent placement to the mid circumflex   . Knee surgery Left   . Peripheral vascular catheterization N/A 08/30/2015    Procedure: Abdominal Aortogram w/Lower Extremity;  Surgeon: Renford Dills, MD;  Location: [facility] INVASIVE CV LAB;  Service: Cardiovascular;  Laterality: N/A;  . Peripheral vascular catheterization  08/30/2015    Procedure: Lower Extremity Intervention;  Surgeon: Renford Dills, MD;  Location: [facility] INVASIVE CV LAB;  Service: Cardiovascular;;    in for   Chief Complaint  Patient presents with  . Foot Pain     HPI  Jaxiel Kines  is a 82 y.o. male, Status post first ray amputation left foot as of yesterday. States he is doing well has no pain.   Social History Social History  Substance Use Topics  . Smoking status: Never Smoker   . Smokeless tobacco: Never Used  . Alcohol Use: No      Family History Family History  Problem Relation Age of Onset  . Heart  disease Mother   . Heart attack Father 45    MI  . Heart attack Brother 38    MI    Prior to Admission medications   Medication Sig Start Date End Date Taking? Authorizing Provider  amiodarone (PACERONE) 200 MG tablet TAKE ONE-HALF TABLET TWICE A DAY 09/09/15  Yes Antonieta Iba, MD  aspirin EC 81 MG tablet Take 1 tablet (81 mg total) by mouth daily. 08/30/15  Yes Renford Dills, MD  carbidopa-levodopa (SINEMET IR) 25-100 MG per tablet Take 1 tablet by mouth 4 (four) times daily.   Yes Historical Provider, MD  Carbidopa-Levodopa ER (SINEMET CR) 25-100 MG tablet controlled release Take 1 tablet by mouth 2 (two) times daily.   Yes Historical Provider, MD  clopidogrel (PLAVIX) 75 MG tablet TAKE 1 TABLET EVERY DAY 10/17/15  Yes Antonieta Iba, MD  cyanocobalamin (,VITAMIN B-12,) 1000 MCG/ML injection Inject 1 mL (1,000 mcg total) into the muscle every 30 (thirty) days. 1 injection in 2 weeks then every 30 days 09/19/15  Yes Steele Sizer, MD  furosemide (LASIX) 40 MG tablet Take 1 tablet (40 mg  total) by mouth 2 (two) times daily. 07/29/15  Yes Antonieta Ibaimothy J Gollan, MD  levothyroxine (SYNTHROID, LEVOTHROID) 150 MCG tablet Take 150 mcg by mouth daily before breakfast.   Yes Historical Provider, MD  potassium chloride (K-DUR,KLOR-CON) 10 MEQ tablet Take 10 mEq by mouth daily.  10/07/15  Yes Historical Provider, MD  pramipexole (MIRAPEX) 1 MG tablet Take 1/2 tablet four times daily.   Yes Historical Provider, MD  sitaGLIPtin (JANUVIA) 100 MG tablet Take 1 tablet (100 mg total) by mouth daily. 07/11/15  Yes Steele SizerMark A Crissman, MD  tamsulosin (FLOMAX) 0.4 MG CAPS capsule Take 1 capsule (0.4 mg total) by mouth daily after supper. 10/17/15  Yes Steele SizerMark A Crissman, MD    Anti-infectives    Start     Dose/Rate Route Frequency Ordered Stop   11/06/15 0600  vancomycin (VANCOCIN) IVPB 1000 mg/200 mL premix     1,000 mg 200 mL/hr over 60 Minutes Intravenous Every 12 hours 11/06/15 0545     11/04/15 1730  vancomycin  (VANCOCIN) IVPB 750 mg/150 ml premix  Status:  Discontinued     750 mg 150 mL/hr over 60 Minutes Intravenous Every 12 hours 11/04/15 1400 11/06/15 0545   11/04/15 1530  piperacillin-tazobactam (ZOSYN) IVPB 3.375 g     3.375 g 12.5 mL/hr over 240 Minutes Intravenous Every 8 hours 11/04/15 1400     11/04/15 1100  vancomycin (VANCOCIN) IVPB 1000 mg/200 mL premix     1,000 mg 200 mL/hr over 60 Minutes Intravenous  Once 11/04/15 1048 11/04/15 1316   11/04/15 1100  piperacillin-tazobactam (ZOSYN) IVPB 3.375 g     3.375 g 100 mL/hr over 30 Minutes Intravenous  Once 11/04/15 1054 11/04/15 1316      Scheduled Meds: . amiodarone  200 mg Oral Daily  . aspirin EC  81 mg Oral Daily  . carbidopa-levodopa  1 tablet Oral QID  . Carbidopa-Levodopa ER  1 tablet Oral BID  . Chlorhexidine Gluconate Cloth  6 each Topical Q0600  . clopidogrel  75 mg Oral Daily  . [START ON 12/03/2015] cyanocobalamin  1,000 mcg Intramuscular Q30 days  . heparin  5,000 Units Subcutaneous Q8H  . insulin aspart  0-9 Units Subcutaneous TID WC  . levothyroxine  150 mcg Oral QAC breakfast  . loratadine  10 mg Oral Daily  . mupirocin ointment  1 application Nasal BID  . piperacillin-tazobactam (ZOSYN)  IV  3.375 g Intravenous Q8H  . potassium chloride  10 mEq Oral Daily  . pramipexole  0.5 mg Oral TID  . tamsulosin  0.4 mg Oral QPC supper  . vancomycin  1,000 mg Intravenous Q12H   Continuous Infusions:  PRN Meds:.guaiFENesin-dextromethorphan, oxyCODONE-acetaminophen  No Known Allergies  Physical Exam  Vitals  Blood pressure 118/50, pulse 63, temperature 98.2 F (36.8 C), temperature source Oral, resp. rate 18, height 6' (1.829 m), weight 84.913 kg (187 lb 3.2 oz), SpO2 97 %.  Lower Extremity exam: Pain is clean dry and intact wound VAC is in place and functional. Patient does not have any complaints of pain. Data Review  CBC  Recent Labs Lab 11/04/15 1105 11/05/15 0353  WBC 10.4 10.6  HGB 12.3* 11.6*  HCT  36.6* 33.9*  PLT 182 169  MCV 90.1 89.2  MCH 30.4 30.4  MCHC 33.7 34.1  RDW 14.7* 14.5  LYMPHSABS 1.0  --   MONOABS 0.8  --   EOSABS 0.1  --   BASOSABS 0.0  --    ------------------------------------------------------------------------------------------------------------------  Chemistries   Recent  Labs Lab 11/04/15 1105 11/05/15 0353  NA 138 138  K 3.9 3.9  CL 101 105  CO2 29 26  GLUCOSE 164* 199*  BUN 24* 21*  CREATININE 1.01 1.00  CALCIUM 8.7* 8.4*     Imaging results:   Dg Foot Complete Left  11/04/2015  CLINICAL DATA:  Medial forefoot and medial heel ulcers. EXAM: LEFT FOOT - COMPLETE 3+ VIEW COMPARISON:  07/22/2015 FINDINGS: Marked hallux valgus deformity identified. Status post osteotomy of the distal aspect of the fifth metatarsal. Soft tissue ulceration overlying in the first MTP joint is identified. No underlying bone erosion identified. Hammertoe deformities are present. Diffuse soft tissue swelling. IMPRESSION: 1. Soft tissue ulceration overlying the first MTP joint. No underlying bone erosion noted. 2. Chronic changes as above Electronically Signed   By: Signa Kell M.D.   On: 11/04/2015 12:11   Assessment & Plan: Patient's doing well at this point he is afebrile and not having any pain. Recommend the dressing and wound VAC intact and functional likely changed tomorrow or Tuesday.  Active Problems:   Diabetic foot (HCC)  Epimenio Sarin M.D on 11/06/2015 at 10:14 AM

## 2015-11-06 NOTE — Progress Notes (Signed)
Pt ordered to have consult to infectious disease. [name], [employer] school teachernurse secretary, notified. She states coverage is not available on the weekend, but that it will be called in first thing tomorrow morning.

## 2015-11-07 ENCOUNTER — Encounter: Payer: Self-pay | Admitting: Podiatry

## 2015-11-07 ENCOUNTER — Encounter
Admission: EM | Disposition: A | Discharge status: Skilled Nursing Facility | Payer: Self-pay | Source: Home / Self Care | Attending: Internal Medicine

## 2015-11-07 ENCOUNTER — Telehealth: Payer: Self-pay | Admitting: Family Medicine

## 2015-11-07 LAB — GLUCOSE, CAPILLARY
GLUCOSE-CAPILLARY: 210 mg/dL — AB (ref 65–99)
GLUCOSE-CAPILLARY: 195 mg/dL — AB (ref 65–99)
Glucose-Capillary: 214 mg/dL — ABNORMAL HIGH (ref 65–99)
Glucose-Capillary: 177 mg/dL — ABNORMAL HIGH (ref 65–99)
Glucose-Capillary: 225 mg/dL — ABNORMAL HIGH (ref 65–99)

## 2015-11-07 LAB — VANCOMYCIN, TROUGH: VANCOMYCIN TR: 29 ug/mL — AB (ref 10–20)

## 2015-11-07 LAB — SEDIMENTATION RATE: Sed Rate: 17 mm/hr (ref 0–20)

## 2015-11-07 LAB — CREATININE, SERUM
Creatinine, Ser: 0.78 mg/dL (ref 0.61–1.24)
GFR calc Af Amer: >60 mL/min (ref >60)
GFR calc non Af Amer: >60 mL/min (ref >60)

## 2015-11-07 SURGERY — LOWER EXTREMITY ANGIOGRAPHY
Anesthesia: Moderate Sedation | Laterality: Left

## 2015-11-07 MED ORDER — SENNA 8.6 MG PO TABS: 2.0000 | ORAL_TABLET | Freq: Every day | ORAL | Status: DC | PRN

## 2015-11-07 MED ORDER — POLYETHYLENE GLYCOL 3350 17 G PO PACK
17.0000 g | PACK | Freq: Every day | ORAL | Status: DC
  Administered 2015-11-07 – 2015-11-09 (×3): 17 g via ORAL
  Filled 2015-11-07 (×3): qty 1

## 2015-11-07 MED ORDER — LINAGLIPTIN 5 MG PO TABS
5.0000 mg | ORAL_TABLET | Freq: Every day | ORAL | Status: DC
  Administered 2015-11-07 – 2015-11-09 (×3): 5 mg via ORAL
  Filled 2015-11-07 (×3): qty 1

## 2015-11-07 NOTE — Progress Notes (Signed)
[facility] [place] at [facility]                                                                                                                                                                                            Patient Demographics   [name], is a 82 y.o. male, DOB - 1925-01-10, ZOX:[id number]  Admit date - 11/04/2015   Admitting Physician Altamese Dilling, MD  Outpatient Primary MD for the patient is Vonita Moss, MD   LOS - 3  Subjective: Feels okay was seen by podiatry plan for [facility] change later today  Review of Systems:   CONSTITUTIONAL: No documented fever. No fatigue, weakness. No weight gain, no weight loss.  EYES: No blurry or double vision.  ENT: No tinnitus. No postnasal drip. No redness of the oropharynx.  RESPIRATORY: No cough, no wheeze, no hemoptysis. No dyspnea.  CARDIOVASCULAR: No chest pain. No orthopnea. No palpitations. No syncope.  GASTROINTESTINAL: No nausea, no vomiting or diarrhea. No abdominal pain. No melena or hematochezia. Positive constipation GENITOURINARY: No dysuria or hematuria.  ENDOCRINE: No polyuria or nocturia. No heat or cold intolerance.  HEMATOLOGY: No anemia. No bruising. No bleeding.  INTEGUMENTARY: No rashes. No lesions.Wound VAC in place MUSCULOSKELETAL: No arthritis. No swelling. No gout.  NEUROLOGIC: No numbness, tingling, or ataxia. No seizure-type activity.  PSYCHIATRIC: No anxiety. No insomnia. No ADD.    Vitals:   Filed Vitals:   11/06/15 1600 11/06/15 2141 11/07/15 0537 11/07/15 0832  BP: 120/72 126/52 120/54 122/60  Pulse: 60 64 67 67  Temp: 97.9 F (36.6 C) 97.8 F (36.6 C) 98.5 F (36.9 C) 98.4 F (36.9 C)  TempSrc: Oral Oral Oral Oral  Resp: Height:      Weight:      SpO2: 95% 97% 97% 95%    Wt Readings from Last 3 Encounters:  11/04/15 84.913 kg (187 lb 3.2 oz)  08/30/15 89.359 kg (197 lb)  07/29/15 88.451 kg (195 lb)     Intake/Output Summary (Last  24 hours) at 11/07/15 1250 Last data filed at 11/07/15 1135  Gross per 24 hour  Intake    620 ml  Output    600 ml  Net     20 ml    Physical Exam:   GENERAL: Pleasant-appearing in no apparent distress.  HEAD, EYES, EARS, NOSE AND THROAT: Atraumatic, normocephalic. Extraocular muscles are intact. Pupils equal and reactive to light. Sclerae anicteric. No conjunctival injection. No oro-pharyngeal erythema.  NECK: Supple. There is no jugular venous distention. No bruits, no lymphadenopathy, no thyromegaly.  HEART: Regular rate and  rhythm,. No murmurs, no rubs, no clicks.  LUNGS: Clear to auscultation bilaterally. No rales or rhonchi. No wheezes.  ABDOMEN: Soft, flat, nontender, nondistended. Has good bowel sounds. No hepatosplenomegaly appreciated.  EXTREMITIES: Left lower extremity with a wound VAC  NEUROLOGIC: The patient is alert, awake, and oriented x3 with no focal motor or sensory deficits appreciated bilaterally.  SKIN: Moist and warm with no rashes appreciated.  Psych: Not anxious, depressed LN: No inguinal LN enlargement    Antibiotics   Anti-infectives    Start     Dose/Rate Route Frequency Ordered Stop   11/06/15 0600  vancomycin (VANCOCIN) IVPB 1000 mg/200 mL premix     1,000 mg 200 mL/hr over 60 Minutes Intravenous Every 12 hours 11/06/15 0545     11/04/15 1730  vancomycin (VANCOCIN) IVPB 750 mg/150 ml premix  Status:  Discontinued     750 mg 150 mL/hr over 60 Minutes Intravenous Every 12 hours 11/04/15 1400 11/06/15 0545   11/04/15 1530  piperacillin-tazobactam (ZOSYN) IVPB 3.375 g     3.375 g 12.5 mL/hr over 240 Minutes Intravenous Every 8 hours 11/04/15 1400     11/04/15 1100  vancomycin (VANCOCIN) IVPB 1000 mg/200 mL premix     1,000 mg 200 mL/hr over 60 Minutes Intravenous  Once 11/04/15 1048 11/04/15 1316   11/04/15 1100  piperacillin-tazobactam (ZOSYN) IVPB 3.375 g     3.375 g 100 mL/hr over 30 Minutes Intravenous  Once 11/04/15 1054 11/04/15 1316       Medications   Scheduled Meds: . amiodarone  200 mg Oral Daily  . aspirin EC  81 mg Oral Daily  . carbidopa-levodopa  1 tablet Oral QID  . Carbidopa-Levodopa ER  1 tablet Oral BID  . Chlorhexidine Gluconate Cloth  6 each Topical Q0600  . clopidogrel  75 mg Oral Daily  . [START ON 12/03/2015] cyanocobalamin  1,000 mcg Intramuscular Q30 days  . docusate sodium  100 mg Oral BID  . feeding supplement (GLUCERNA SHAKE)  237 mL Oral TID BM  . heparin  5,000 Units Subcutaneous Q8H  . insulin aspart  0-9 Units Subcutaneous TID WC  . levothyroxine  150 mcg Oral QAC breakfast  . loratadine  10 mg Oral Daily  . mupirocin ointment  1 application Nasal BID  . piperacillin-tazobactam (ZOSYN)  IV  3.375 g Intravenous Q8H  . potassium chloride  10 mEq Oral Daily  . pramipexole  0.5 mg Oral TID  . tamsulosin  0.4 mg Oral QPC supper  . vancomycin  1,000 mg Intravenous Q12H   Continuous Infusions:  PRN Meds:.guaiFENesin-dextromethorphan, oxyCODONE-acetaminophen, senna   Data Review:   Micro Results Recent Results (from the past 240 hour(s))  WOUND CULTURE ([facility] ONLY)     Status: None (Preliminary result)   Collection Time: 11/01/15  1:20 PM  Result Value Ref Range Status   Specimen Description HEEL  Final   Special Requests LEFT  Final   Gram Stain RARE WBC SEEN NO ORGANISMS SEEN   Final   Culture   Final    LIGHT GROWTH PROTEUS MIRABILIS LIGHT GROWTH METHICILLIN RESISTANT STAPHYLOCOCCUS AUREUS MODERATE GROWTH ENTEROCOCCUS FAECALIS SUSCEPTIBILITIES TO FOLLOW    Report Status PENDING  Incomplete   Organism ID, Bacteria PROTEUS MIRABILIS  Final   Organism ID, Bacteria METHICILLIN RESISTANT STAPHYLOCOCCUS AUREUS  Final      Susceptibility   Proteus mirabilis - MIC*    AMPICILLIN <=2 SENSITIVE Sensitive     CEFAZOLIN <=4 SENSITIVE Sensitive     CEFEPIME <=  1 SENSITIVE Sensitive     CEFTAZIDIME <=1 SENSITIVE Sensitive     CEFTRIAXONE <=1 SENSITIVE Sensitive     CIPROFLOXACIN <=0.25  SENSITIVE Sensitive     GENTAMICIN <=1 SENSITIVE Sensitive     IMIPENEM 2 SENSITIVE Sensitive     TRIMETH/SULFA <=20 SENSITIVE Sensitive     AMPICILLIN/SULBACTAM <=2 SENSITIVE Sensitive     PIP/TAZO <=4 SENSITIVE Sensitive     * LIGHT GROWTH PROTEUS MIRABILIS   Methicillin resistant staphylococcus aureus - MIC*    CIPROFLOXACIN >=8 RESISTANT Resistant     ERYTHROMYCIN >=8 RESISTANT Resistant     GENTAMICIN <=0.5 SENSITIVE Sensitive     OXACILLIN >=4 RESISTANT Resistant     TETRACYCLINE 2 SENSITIVE Sensitive     VANCOMYCIN 1 SENSITIVE Sensitive     TRIMETH/SULFA <=10 SENSITIVE Sensitive     CLINDAMYCIN >=8 RESISTANT Resistant     RIFAMPIN <=0.5 SENSITIVE Sensitive     Inducible Clindamycin NEGATIVE Sensitive     * LIGHT GROWTH METHICILLIN RESISTANT STAPHYLOCOCCUS AUREUS  Surgical pcr screen     Status: Abnormal   Collection Time: 11/04/15  6:11 PM  Result Value Ref Range Status   MRSA, PCR POSITIVE (A) NEGATIVE Final    Comment: CRITICAL RESULT CALLED TO, READ BACK BY AND VERIFIED WITH: MARY RAYMOND AT 2324 11/04/15.PMH    Staphylococcus aureus POSITIVE (A) NEGATIVE Final    Comment:        The Xpert SA Assay (FDA approved for NASAL specimens in patients over 62 years of age), is one component of a comprehensive surveillance program.  Test performance has been validated by [facility] for patients greater than or equal to 69 year old. It is not intended to diagnose infection nor to guide or monitor treatment.   WOUND CULTURE ([facility] ONLY)     Status: None (Preliminary result)   Collection Time: 11/05/15 12:17 PM  Result Value Ref Range Status   Specimen Description WOUND  Final   Special Requests NONE  Final   Gram Stain   Final    FEW RED BLOOD CELLS RARE WBC SEEN NO ORGANISMS SEEN    Culture   Final    LIGHT GROWTH PROTEUS MIRABILIS REPEATING SUSCEPTIBILITIES    Report Status PENDING  Incomplete    Radiology Reports Dg Foot Complete Left  11/04/2015  CLINICAL  DATA:  Medial forefoot and medial heel ulcers. EXAM: LEFT FOOT - COMPLETE 3+ VIEW COMPARISON:  07/22/2015 FINDINGS: Marked hallux valgus deformity identified. Status post osteotomy of the distal aspect of the fifth metatarsal. Soft tissue ulceration overlying in the first MTP joint is identified. No underlying bone erosion identified. Hammertoe deformities are present. Diffuse soft tissue swelling. IMPRESSION: 1. Soft tissue ulceration overlying the first MTP joint. No underlying bone erosion noted. 2. Chronic changes as above Electronically Signed   By: Signa Kell M.D.   On: 11/04/2015 12:11     CBC  Recent Labs Lab 11/04/15 1105 11/05/15 0353  WBC 10.4 10.6  HGB 12.3* 11.6*  HCT 36.6* 33.9*  PLT 182 169  MCV 90.1 89.2  MCH 30.4 30.4  MCHC 33.7 34.1  RDW 14.7* 14.5  LYMPHSABS 1.0  --   MONOABS 0.8  --   EOSABS 0.1  --   BASOSABS 0.0  --     Chemistries   Recent Labs Lab 11/04/15 1105 11/05/15 0353 11/07/15 0435  NA 138 138  --   K 3.9 3.9  --   CL 101 105  --  CO2 29 26  --   GLUCOSE 164* 199*  --   BUN 24* 21*  --   CREATININE 1.01 1.00 0.78  CALCIUM 8.7* 8.4*  --    ------------------------------------------------------------------------------------------------------------------ estimated creatinine clearance is 70.1 mL/min (by C-G formula based on Cr of 0.78). ------------------------------------------------------------------------------------------------------------------ No results for input(s): HGBA1C in the last 72 hours. ------------------------------------------------------------------------------------------------------------------ No results for input(s): CHOL, HDL, LDLCALC, TRIG, CHOLHDL, LDLDIRECT in the last 72 hours. ------------------------------------------------------------------------------------------------------------------ No results for input(s): TSH, T4TOTAL, T3FREE, THYROIDAB in the last 72 hours.  Invalid input(s):  FREET3 ------------------------------------------------------------------------------------------------------------------ No results for input(s): VITAMINB12, FOLATE, FERRITIN, TIBC, IRON, RETICCTPCT in the last 72 hours.  Coagulation profile No results for input(s): INR, PROTIME in the last 168 hours.  No results for input(s): DDIMER in the last 72 hours.  Cardiac Enzymes No results for input(s): CKMB, TROPONINI, MYOGLOBIN in the last 168 hours.  Invalid input(s): CK ------------------------------------------------------------------------------------------------------------------ Invalid input(s): POCBNP    Assessment & Plan  Patient is a 82 year old with a diabetic foot ulcer * Diabetic foot with peripheral vascular disease  wound cultures showing Proteus previous wound culture with MRSA Await infectious disease input Continue current antibiotics  * Diabetes Blood sugars trending up will restart his home regimen  * Peripheral arterial disease  Vascular consult appreciated  * Parkinson's disease  Continue carbidopa levodopa.  * Coronary artery disease   We'll continue aspirin and Plavix and amiodarone.   *Constipation add senna and MiraLAX  *Hypothyroidism continue Synthroid  *Miscellaneous heparin for DVT prophylaxis     Code Status Orders        Start     Ordered   11/04/15 1400  Full code   Continuous     11/04/15 1400    Code Status History    Date Active Date Inactive Code Status Order ID Comments User Context   11/04/2015 12:44 PM 11/04/2015  2:00 PM DNR [id number]  Altamese DillingVaibhavkumar Vachhani, MD ED    Advance Directive Documentation        Most Recent Value   Type of Advance Directive  Healthcare Power of Attorney   Pre-existing out of facility DNR order (yellow form or pink MOST form)     "MOST" Form in Place?             Consults  Podiatry and vascular surgery DVT Prophylaxis   Heparin  Lab Results  Component Value Date   PLT 169  11/05/2015     Time Spent in minutes   35min Greater than 50% of time spent in care coordination and counseling patient regarding the condition and plan of care.   Auburn BilberryPATEL, Saiya Crist M.D on 11/07/2015 at 12:50 PM  Between 7am to 6pm - Pager - [phone]  After 6pm go to [email or web address] - password [email or web address] [facility]  [facility]   Office  [phone]

## 2015-11-07 NOTE — Consult Note (Signed)
[facility] Infectious Disease     Reason for Consult: Diabetic foot infection with osteomyeltis Referring Physician: Ulysees Barns Date of Admission:  11/04/2015   Active Problems:   Diabetic foot ([place])   HPI: [name] is a 82 y.o. male admitted 6/2 with worsening of a chronic L foot wound despite oral abx as otpt. He was was seen by podiatry and vascular, and decision made to surgically resect the first ray and debride the meial heel ulceration.  On 6/3 he underwent first ray amputation and abx bead placement and debridement of necrotic L heel ulcer.  He was felt following surgery to have adequate blood supply based on amt of bleeding noted.  Cultures from this admission are growing proteus species, and pending. Recent   otpt cx 5/30 is growing MRSA Proteus and enterococcus.  Past Medical History  Diagnosis Date  . Coronary artery disease   . Diabetes mellitus without complication ([place])   . Aortic stenosis   . Hypertension   . Hyperlipidemia   . TIA (transient ischemic attack)   . Thyroid disease   . Hypothyroidism    Past Surgical History  Procedure Laterality Date  . Appendectomy    . Hernia repair    . Knee surgery      bilateral  . Eye surgery    . Colonoscopy    . Cardiac catheterization  2010    showed a patient circumflex stent with noncritical CAD and normal right-sided pressures.  . Coronary angioplasty  01/24/2007    stent placement to the mid circumflex   . Knee surgery Left   . Peripheral vascular catheterization N/A 08/30/2015    Procedure: Abdominal Aortogram w/Lower Extremity;  Surgeon: Katha Cabal, MD;  Location: [place] [facility];  Service: Cardiovascular;  Laterality: N/A;  . Peripheral vascular catheterization  08/30/2015    Procedure: Lower Extremity Intervention;  Surgeon: Katha Cabal, MD;  Location: [place] [facility];  Service: Cardiovascular;;  . Amputation toe Left 11/05/2015    Procedure: AMPUTATION TOE;  Surgeon: Albertine Patricia,  DPM;  Location: [facility] ORS;  Service: Podiatry;  Laterality: Left;  . Irrigation and debridement foot Left 11/05/2015    Procedure: IRRIGATION AND DEBRIDEMENT FOOT / HEEL ULCER;  Surgeon: Albertine Patricia, DPM;  Location: [facility] ORS;  Service: Podiatry;  Laterality: Left;  . Application of wound vac Left 11/05/2015    Procedure: APPLICATION OF WOUND VAC;  Surgeon: Albertine Patricia, DPM;  Location: [facility] ORS;  Service: Podiatry;  Laterality: Left;   Social History  Substance Use Topics  . Smoking status: Never Smoker   . Smokeless tobacco: Never Used  . Alcohol Use: No   Family History  Problem Relation Age of Onset  . Heart disease Mother   . Heart attack Father 22    MI  . Heart attack Brother 41    MI    Allergies: No Known Allergies  Current antibiotics: Antibiotics Given (last 72 hours)    Date/Time Action Medication Dose Rate   11/04/15 2242 Given   piperacillin-tazobactam (ZOSYN) IVPB 3.375 g 3.375 g 12.5 mL/hr   11/05/15 0521 Given   vancomycin (VANCOCIN) IVPB 750 mg/150 ml premix 750 mg 150 mL/hr   11/05/15 0805 Given   piperacillin-tazobactam (ZOSYN) IVPB 3.375 g 3.375 g 12.5 mL/hr   11/05/15 1543 Given   piperacillin-tazobactam (ZOSYN) IVPB 3.375 g 3.375 g 12.5 mL/hr   11/05/15 1701 Given   vancomycin (VANCOCIN) IVPB 750 mg/150 ml premix 750 mg 150 mL/hr  11/05/15 2134 Given   piperacillin-tazobactam (ZOSYN) IVPB 3.375 g 3.375 g 12.5 mL/hr   11/06/15 5916 Given   vancomycin (VANCOCIN) IVPB 1000 mg/200 mL premix 1,000 mg 200 mL/hr   11/06/15 3846 Given   piperacillin-tazobactam (ZOSYN) IVPB 3.375 g 3.375 g 12.5 mL/hr   11/06/15 1723 Given   piperacillin-tazobactam (ZOSYN) IVPB 3.375 g 3.375 g 12.5 mL/hr   11/06/15 1723 Given   vancomycin (VANCOCIN) IVPB 1000 mg/200 mL premix 1,000 mg 200 mL/hr   11/06/15 2306 Given   piperacillin-tazobactam (ZOSYN) IVPB 3.375 g 3.375 g 12.5 mL/hr   11/07/15 0527 Given   vancomycin (VANCOCIN) IVPB 1000 mg/200 mL premix 1,000 mg 200  mL/hr   11/07/15 0854 Given   piperacillin-tazobactam (ZOSYN) IVPB 3.375 g 3.375 g 12.5 mL/hr   11/07/15 1611 Given   piperacillin-tazobactam (ZOSYN) IVPB 3.375 g 3.375 g 12.5 mL/hr      MEDICATIONS: . amiodarone  200 mg Oral Daily  . aspirin EC  81 mg Oral Daily  . carbidopa-levodopa  1 tablet Oral QID  . Carbidopa-Levodopa ER  1 tablet Oral BID  . Chlorhexidine Gluconate Cloth  6 each Topical Q0600  . clopidogrel  75 mg Oral Daily  . [START ON 12/03/2015] cyanocobalamin  1,000 mcg Intramuscular Q30 days  . docusate sodium  100 mg Oral BID  . feeding supplement (GLUCERNA SHAKE)  237 mL Oral TID BM  . heparin  5,000 Units Subcutaneous Q8H  . insulin aspart  0-9 Units Subcutaneous TID WC  . levothyroxine  150 mcg Oral QAC breakfast  . linagliptin  5 mg Oral Daily  . loratadine  10 mg Oral Daily  . mupirocin ointment  1 application Nasal BID  . piperacillin-tazobactam (ZOSYN)  IV  3.375 g Intravenous Q8H  . polyethylene glycol  17 g Oral Daily  . potassium chloride  10 mEq Oral Daily  . pramipexole  0.5 mg Oral TID  . tamsulosin  0.4 mg Oral QPC supper  . vancomycin  1,000 mg Intravenous Q12H    Review of Systems - 11 systems reviewed and negative per HPI   OBJECTIVE: Temp:  [95.9 F (35.5 C)-98.5 F (36.9 C)] 95.9 F (35.5 C) (06/05 1616) Pulse Rate:  [62-67] 62 (06/05 1616) Resp:  [18-20] 18 (06/05 1616) BP: (118-126)/(50-60) 118/50 mmHg (06/05 1616) SpO2:  [94 %-97 %] 94 % (06/05 1616) Physical Exam  Constitutional: He is oriented to person, place, and time. Frail appearing, hoh HENT: anicteric Mouth/Throat: Oropharynx is clear and moist. No oropharyngeal exudate.  Cardiovascular: Normal rate, regular rhythm and normal heart sounds.Pulmonary/Chest: Effort normal and breath sounds normal. No respiratory distress. He has no wheezes.  Abdominal: Soft. Bowel sounds are normal. He exhibits no distension. There is no tenderness.  Lymphadenopathy:  He has no cervical  adenopathy.  Neurological: He is alert and oriented to person, place, and time.  Skin: Skin is warm and dry. No rash noted. No erythema.  Psychiatric: He has a normal mood and affect. His behavior is normal.  L foot s/p surgery with well approximated medial incision with no drainage, sutures in place. Medial heal with shallow erythema    LABS: Results for orders placed or performed during the hospital encounter of 11/04/15 (from the past 48 hour(s))  Glucose, capillary     Status: Abnormal   Collection Time: 11/05/15  9:23 PM  Result Value Ref Range   Glucose-Capillary 185 (H) 65 - 99 mg/dL   Comment 1 Notify RN   Vancomycin, trough  Status: None   Collection Time: 11/06/15  5:11 AM  Result Value Ref Range   Vancomycin Tr 10 10 - 20 ug/mL  Glucose, capillary     Status: Abnormal   Collection Time: 11/06/15  7:38 AM  Result Value Ref Range   Glucose-Capillary 165 (H) 65 - 99 mg/dL   Comment 1 Notify RN   Glucose, capillary     Status: Abnormal   Collection Time: 11/06/15 11:34 AM  Result Value Ref Range   Glucose-Capillary 189 (H) 65 - 99 mg/dL   Comment 1 Notify RN   Glucose, capillary     Status: Abnormal   Collection Time: 11/06/15  4:57 PM  Result Value Ref Range   Glucose-Capillary 214 (H) 65 - 99 mg/dL   Comment 1 Notify RN   Glucose, capillary     Status: Abnormal   Collection Time: 11/06/15  9:36 PM  Result Value Ref Range   Glucose-Capillary 189 (H) 65 - 99 mg/dL   Comment 1 Notify RN   Creatinine, serum     Status: None   Collection Time: 11/07/15  4:35 AM  Result Value Ref Range   Creatinine, Ser 0.78 0.61 - 1.24 mg/dL   GFR calc non Af Amer >60 >60 mL/min   GFR calc Af Amer >60 >60 mL/min    Comment: (NOTE) The eGFR has been calculated using the CKD EPI equation. This calculation has not been validated in all clinical situations. eGFR's persistently <60 mL/min signify possible Chronic Kidney Disease.   Glucose, capillary     Status: Abnormal    Collection Time: 11/07/15  8:12 AM  Result Value Ref Range   Glucose-Capillary 210 (H) 65 - 99 mg/dL   Comment 1 Notify RN   Glucose, capillary     Status: Abnormal   Collection Time: 11/07/15 11:02 AM  Result Value Ref Range   Glucose-Capillary 195 (H) 65 - 99 mg/dL  Glucose, capillary     Status: Abnormal   Collection Time: 11/07/15  4:16 PM  Result Value Ref Range   Glucose-Capillary 177 (H) 65 - 99 mg/dL   No components found for: ESR, C REACTIVE PROTEIN MICRO: Recent Results (from the past 720 hour(s))  WOUND CULTURE ([facility] ONLY)     Status: None (Preliminary result)   Collection Time: 11/01/15  1:20 PM  Result Value Ref Range Status   Specimen Description HEEL  Final   Special Requests LEFT  Final   Gram Stain RARE WBC SEEN NO ORGANISMS SEEN   Final   Culture   Final    LIGHT GROWTH PROTEUS MIRABILIS LIGHT GROWTH METHICILLIN RESISTANT STAPHYLOCOCCUS AUREUS MODERATE GROWTH ENTEROCOCCUS FAECALIS REPEATING SUSCEPTIBILITIES    Report Status PENDING  Incomplete   Organism ID, Bacteria PROTEUS MIRABILIS  Final   Organism ID, Bacteria METHICILLIN RESISTANT STAPHYLOCOCCUS AUREUS  Final      Susceptibility   Proteus mirabilis - MIC*    AMPICILLIN <=2 SENSITIVE Sensitive     CEFAZOLIN <=4 SENSITIVE Sensitive     CEFEPIME <=1 SENSITIVE Sensitive     CEFTAZIDIME <=1 SENSITIVE Sensitive     CEFTRIAXONE <=1 SENSITIVE Sensitive     CIPROFLOXACIN <=0.25 SENSITIVE Sensitive     GENTAMICIN <=1 SENSITIVE Sensitive     IMIPENEM 2 SENSITIVE Sensitive     TRIMETH/SULFA <=20 SENSITIVE Sensitive     AMPICILLIN/SULBACTAM <=2 SENSITIVE Sensitive     PIP/TAZO <=4 SENSITIVE Sensitive     * LIGHT GROWTH PROTEUS MIRABILIS   Methicillin resistant staphylococcus aureus -  MIC*    CIPROFLOXACIN >=8 RESISTANT Resistant     ERYTHROMYCIN >=8 RESISTANT Resistant     GENTAMICIN <=0.5 SENSITIVE Sensitive     OXACILLIN >=4 RESISTANT Resistant     TETRACYCLINE 2 SENSITIVE Sensitive     VANCOMYCIN 1  SENSITIVE Sensitive     TRIMETH/SULFA <=10 SENSITIVE Sensitive     CLINDAMYCIN >=8 RESISTANT Resistant     RIFAMPIN <=0.5 SENSITIVE Sensitive     Inducible Clindamycin NEGATIVE Sensitive     * LIGHT GROWTH METHICILLIN RESISTANT STAPHYLOCOCCUS AUREUS  Surgical pcr screen     Status: Abnormal   Collection Time: 11/04/15  6:11 PM  Result Value Ref Range Status   MRSA, PCR POSITIVE (A) NEGATIVE Final    Comment: CRITICAL RESULT CALLED TO, READ BACK BY AND VERIFIED WITH: MARY RAYMOND AT 2324 11/04/15.PMH    Staphylococcus aureus POSITIVE (A) NEGATIVE Final    Comment:        The Xpert SA Assay (FDA approved for NASAL specimens in patients over 67 years of age), is one component of a comprehensive surveillance program.  Test performance has been validated by [facility] for patients greater than or equal to 69 year old. It is not intended to diagnose infection nor to guide or monitor treatment.   Anaerobic culture     Status: None (Preliminary result)   Collection Time: 11/05/15 12:17 PM  Result Value Ref Range Status   Specimen Description WOUND  Final   Special Requests NONE  Final   Culture NO ANAEROBES ISOLATED  Final   Report Status PENDING  Incomplete  WOUND CULTURE ([facility] ONLY)     Status: None (Preliminary result)   Collection Time: 11/05/15 12:17 PM  Result Value Ref Range Status   Specimen Description WOUND  Final   Special Requests NONE  Final   Gram Stain   Final    FEW RED BLOOD CELLS RARE WBC SEEN NO ORGANISMS SEEN    Culture   Final    LIGHT GROWTH PROTEUS MIRABILIS REPEATING SUSCEPTIBILITIES    Report Status PENDING  Incomplete    IMAGING: Dg Foot Complete Left  11/04/2015  CLINICAL DATA:  Medial forefoot and medial heel ulcers. EXAM: LEFT FOOT - COMPLETE 3+ VIEW COMPARISON:  07/22/2015 FINDINGS: Marked hallux valgus deformity identified. Status post osteotomy of the distal aspect of the fifth metatarsal. Soft tissue ulceration overlying in the first MTP  joint is identified. No underlying bone erosion identified. Hammertoe deformities are present. Diffuse soft tissue swelling. IMPRESSION: 1. Soft tissue ulceration overlying the first MTP joint. No underlying bone erosion noted. 2. Chronic changes as above Electronically Signed   By: Kerby Moors M.D.   On: 11/04/2015 12:11    Assessment:   [name] is a 82 y.o. male with DM, PVD s/p revascularization admitted with worsening of chronic wound on L medial foot and a medial ankle ulcer, s/p surgical amputatton first ray and abx placement 6/3, prior cx with MRSA, enterococcus and proteus.  current cx pending.  It appears most of the infected bone has been removed however I think he would benefit from a 10-14 day course of IV abx to ensure resolution.  Recommendations Check esr, crp Place PICC - ordered Continue vanco and zosyn pending culture results Will likley use vanco and ceftriaxone unless the pending cultures reveal resistnat organisms. This will cover MRSA, enteroccus and the proteus. I can see in 1-2  weeks prior to stopping IV abx  Thank you very much for  allowing me to participate in the care of this patient. Please call with questions.   Cheral Marker. Ola Spurr, MD

## 2015-11-07 NOTE — Progress Notes (Signed)
The left heel wound debrided on 11-05-15 was 7mm in depth and did penetrate to fat tissue on the area.  Skin, subq tissue, and fat was debrided with combination of 15 scalpel blade and versajet.

## 2015-11-07 NOTE — Progress Notes (Signed)
Inpatient Diabetes Program Recommendations  AACE/ADA: New Consensus Statement on Inpatient Glycemic Control (2015)  Target Ranges:  Prepandial:   less than 140 mg/dL      Peak postprandial:   less than 180 mg/dL (1-2 hours)      Critically ill patients:  140 - 180 mg/dL   Results for [name], Cosme K (MRN [id number]) as of 11/07/2015 11:46  Ref. Range 11/06/2015 07:38 11/06/2015 11:34 11/06/2015 16:57 11/06/2015 21:36 11/07/2015 08:12 11/07/2015 11:02  Glucose-Capillary Latest Ref Range: 65-99 mg/dL 409165 (H) 811189 (H) 914214 (H) 189 (H) 210 (H) 195 (H)   Review of Glycemic Control  Diabetes history: DM2 Outpatient Diabetes medications: Januvia 100 mg daily  Current orders for Inpatient glycemic control: Novolog 0-9 units TID with meals  Inpatient Diabetes Program Recommendations: Correction (SSI): Please consider increasing Novolog correction to Moderate scale and adding Novolog bedtime correction scale.  Thanks, Orlando PennerMarie Margel Joens, RN, MSN, CDE Diabetes Coordinator Inpatient Diabetes Program [phone] (Team Pager from 8am to 5pm) [phone] ([facility] office) [phone] [facility] office) [phone] [facility] office)

## 2015-11-07 NOTE — Care Management (Signed)
Called back into room by PT/patient's daughter. They want a new Home health agency- they want [facility]. I have notified [facility] that they no longer want their services. [facility] to [facility]. Daughter is requesting [facility] bed [facility].  Patient has bilateral lower extremity wounds and immobile which requires lower extremities/body to be positioned in ways not feasible with a normal bed. Head must be elevated at least 30 degrees or more.

## 2015-11-07 NOTE — Progress Notes (Signed)
Patient Demographics  [name], is a 82 y.o. male   MRN: [id number]   DOB - 07-31-1924  Admit Date - 11/04/2015    Outpatient Primary MD for the patient is Vonita Moss, MD  Consult requested in the Hospital by Auburn Bilberry, MD, On 11/07/2015   With History of -  Past Medical History  Diagnosis Date  . Coronary artery disease   . Diabetes mellitus without complication ([facility])   . Aortic stenosis   . Hypertension   . Hyperlipidemia   . TIA (transient ischemic attack)   . Thyroid disease   . Hypothyroidism       Past Surgical History  Procedure Laterality Date  . Appendectomy    . Hernia repair    . Knee surgery      bilateral  . Eye surgery    . Colonoscopy    . Cardiac catheterization  2010    showed a patient circumflex stent with noncritical CAD and normal right-sided pressures.  . Coronary angioplasty  01/24/2007    stent placement to the mid circumflex   . Knee surgery Left   . Peripheral vascular catheterization N/A 08/30/2015    Procedure: Abdominal Aortogram w/Lower Extremity;  Surgeon: Renford Dills, MD;  Location: [facility];  Service: Cardiovascular;  Laterality: N/A;  . Peripheral vascular catheterization  08/30/2015    Procedure: Lower Extremity Intervention;  Surgeon: Renford Dills, MD;  Location: [facility];  Service: Cardiovascular;;    in for   Chief Complaint  Patient presents with  . Foot Pain     HPI  [name]  is a 82 y.o. male, 2 days status post first ray amputation left foot    Anti-infectives    Start     Dose/Rate Route Frequency Ordered Stop   11/06/15 0600  vancomycin (VANCOCIN) IVPB 1000 mg/200 mL premix     1,000 mg 200 mL/hr over 60 Minutes Intravenous Every 12 hours 11/06/15 0545     11/04/15 1730  vancomycin (VANCOCIN) IVPB 750 mg/150 ml premix   Status:  Discontinued     750 mg 150 mL/hr over 60 Minutes Intravenous Every 12 hours 11/04/15 1400 11/06/15 0545   11/04/15 1530  piperacillin-tazobactam (ZOSYN) IVPB 3.375 g     3.375 g 12.5 mL/hr over 240 Minutes Intravenous Every 8 hours 11/04/15 1400     11/04/15 1100  vancomycin (VANCOCIN) IVPB 1000 mg/200 mL premix     1,000 mg 200 mL/hr over 60 Minutes Intravenous  Once 11/04/15 1048 11/04/15 1316   11/04/15 1100  piperacillin-tazobactam (ZOSYN) IVPB 3.375 g     3.375 g 100 mL/hr over 30 Minutes Intravenous  Once 11/04/15 1054 11/04/15 1316      Scheduled Meds: . amiodarone  200 mg Oral Daily  . aspirin EC  81 mg Oral Daily  . carbidopa-levodopa  1 tablet Oral QID  . Carbidopa-Levodopa ER  1 tablet Oral BID  . Chlorhexidine Gluconate Cloth  6 each Topical Q0600  . clopidogrel  75 mg Oral Daily  . [START ON 12/03/2015] cyanocobalamin  1,000 mcg Intramuscular Q30 days  . docusate sodium  100 mg Oral BID  .  feeding supplement (GLUCERNA SHAKE)  237 mL Oral TID BM  . heparin  5,000 Units Subcutaneous Q8H  . insulin aspart  0-9 Units Subcutaneous TID WC  . levothyroxine  150 mcg Oral QAC breakfast  . loratadine  10 mg Oral Daily  . mupirocin ointment  1 application Nasal BID  . piperacillin-tazobactam (ZOSYN)  IV  3.375 g Intravenous Q8H  . potassium chloride  10 mEq Oral Daily  . pramipexole  0.5 mg Oral TID  . tamsulosin  0.4 mg Oral QPC supper  . vancomycin  1,000 mg Intravenous Q12H   Continuous Infusions:  PRN Meds:.guaiFENesin-dextromethorphan, oxyCODONE-acetaminophen  No Known Allergies  Physical Exam: Patient is alert and well-oriented and has only minimal pain to the area at this point.  Vitals  Blood pressure 120/54, pulse 67, temperature 98.5 F (36.9 C), temperature source Oral, resp. rate 20, height 6' (1.829 m), weight 84.913 kg (187 lb 3.2 oz), SpO2 97 %.  Lower Extremity exam: Dressing removed the incision margin is intact there is no erythema redness  to the region. Ulceration which was debrided as wound VAC area shows good granular tissue looks to be healthy and progressing well. Data Review  CBC  Recent Labs Lab 11/04/15 1105 11/05/15 0353  WBC 10.4 10.6  HGB 12.3* 11.6*  HCT 36.6* 33.9*  PLT 182 169  MCV 90.1 89.2  MCH 30.4 30.4  MCHC 33.7 34.1  RDW 14.7* 14.5  LYMPHSABS 1.0  --   MONOABS 0.8  --   EOSABS 0.1  --   BASOSABS 0.0  --    ------------------------------------------------------------------------------------------------------------------  Chemistries   Recent Labs Lab 11/04/15 1105 11/05/15 0353 11/07/15 0435  NA 138 138  --   K 3.9 3.9  --   CL 101 105  --   CO2 29 26  --   GLUCOSE 164* 199*  --   BUN 24* 21*  --   CREATININE 1.01 1.00 0.78  CALCIUM 8.7* 8.4*  --    --------Assessment & Plan: Incision margin to the amputation site looks very good with no redness normal drainage and good stability of the incision margin. A very very small area of slight dehiscence is noted at the central portion of the incision addition progressed well to the color of the skin looks very good. The ulceration to heal is intact very stable has a good granular base. Plan: Redressed the amputation site and the ulcer site. We'll have the wound VAC replaced later today.    Active Problems:   Diabetic foot ([facility])   Family Communication: Plan discussed with patient and Epimenio Sarin**   Pamula Luther G M.D on 11/07/2015 at 8:25 AM

## 2015-11-07 NOTE — Telephone Encounter (Signed)
Patient son Varney DailyDavid Ketchem called to let Dr. Dossie Arbourrissman know that his dad is in the hospital admitted because he had a wound and had to remove his big toe, joint and wound.

## 2015-11-07 NOTE — Progress Notes (Signed)
Pharmacy Antibiotic Note  [name] is a 82 y.o. male admitted on 11/04/2015 with wound infection.  Pharmacy has been consulted for Vancomycin dosing.  Plan: Vancomycin 1 gm  IV every 12 hours.  Goal trough 15-20 mcg/mL.   6/5:  Vanc trough @ 19:00 = 29 mcg/mL This level was drawn AFTER Vanc dose was given @ 18:29, therefore does not represent a true trough. Will order another trough level before next dose on 6/6 @ 5:30.   Height: 6' (182.9 cm) Weight: 187 lb 3.2 oz (84.913 kg) IBW/kg (Calculated) : 77.6  Temp (24hrs), Avg:97.4 F (36.3 C), Min:95.9 F (35.5 C), Max:98.5 F (36.9 C)   Recent Labs Lab 11/04/15 1105 11/05/15 0353 11/06/15 0511 11/07/15 0435 11/07/15 1908  WBC 10.4 10.6  --   --   --   CREATININE 1.01 1.00  --  0.78  --   VANCOTROUGH  --   --  10  --  29*    Estimated Creatinine Clearance: 70.1 mL/min (by C-G formula based on Cr of 0.78).    No Known Allergies  Antimicrobials this admission:   >> Zosyn 3.375 gm IV Q8H    >> Vancomycin 1 gm IV Q12H   Dose adjustments this admission:   Microbiology results:  BCx:   UCx:    Sputum:    MRSA PCR:   Thank you for allowing pharmacy to be a part of this patient's care.  Chastity Noland D 11/07/2015 7:57 PM

## 2015-11-07 NOTE — Clinical Documentation Improvement (Signed)
Hospitalist  Please further clarify procedure documentation in the medical record of "debridement."   Depth of - skin, subcutaneous tissue/fascia, muscle, joint, bone, etc,  Specify the appearance and size of the wound, ex. fresh bleeding, tissue, etc.  Specify the nature of tissue removed, ex. slough, necrotic, devitalized tissue, nonviable tissue, etc.  Other condition  Clinically Undetermined   Supporting Information: This time to his directed the medial left heel where a chronic necrotic ulceration approximately 2.5 cm diameter was identified the superficial hard necrotic tissue was removed with a 15 blade and then a versa jet was utilized to remove 5 or points of necrosis and dead tissue. Once this was accomplished a wound VAC was placed on the left heel at 125 m mercury pressure continuous. Sterile compressive dressings were placed across the forefoot amputation site consisting of Xeroform gauze 4 x 4's, conform, ABDs pads and Kerlix per 6/03 Operative note.   Please exercise your independent, professional judgment when responding. A specific answer is not anticipated or expected.   Thank Sabino DonovanYou, Mignonne Afonso [facility] [employer] [facility] [place] [phone]

## 2015-11-07 NOTE — Progress Notes (Addendum)
Physical Therapy Evaluation Patient Details Name: [name] MRN: [id number] DOB: 02-15-1925 Today's Date: 11/07/2015   History of Present Illness  [name] is a 82 y.o. male with a known history of coronary artery disease, diabetes, aortic stenosis, hypertension, hyperlipidemia, thyroid disease, hypothyroidism- has chronic circulatory issue in both lower extremities and he had undergone surgery by vascular Dr.Schiener- successful restoration of the circulation on the right lower extremity but on the left after 3-4 surgeries there was not successful circulation up to her foot. He follows with the phone the clinic and podiatry clinic for his chronic wound on the left foot. Was recently given a course of antibiotic, which he finished a few weeks ago. For last few days his wound on the right foot started turning red again and had some discharge, so he went to podiatry clinic yesterday and after looking at his wound Dr. Graciela Husbands advised him to go to emergency room. Pt underwent L first ray amputation and is POD#2 at time of PT evaluation  Clinical Impression  Pt demonstrates considerable debility and has very limited baseline function. Family reports that he was able to assist with limited transfers up until a couple weeks ago but has had a progressive decline. He has been using a sliding board for transfers over the last few weeks. Pt uses a wheelchair as primary means of mobility and has done so for multiple years. Pt refuses to attempt squat pivot or sliding board transfers on this date. He demonstrates difficulty with sitting balance initially but improves with extended duration. Pt with considerable bilateral knee flexion contractures, L worse than R. Pt will benefit from [facility] PT to maintain function with sitting balance and limited transfers. Family interested in hospital bed for patient so care manager notified. Pt will benefit from skilled PT services to address deficits in strength, balance, and  mobility in order to return to full function at home.     Follow Up Recommendations Home health PT    Equipment Recommendations  Other (comment) (May benefit from hospital bed to assist with transfers)    Recommendations for Other Services       Precautions / Restrictions Precautions Precautions: Fall Restrictions Weight Bearing Restrictions: Yes LLE Weight Bearing: Non weight bearing Other Position/Activity Restrictions: L foot in dressing s/p first ray amputation      Mobility  Bed Mobility Overal bed mobility: Needs Assistance Bed Mobility: Supine to Sit;Sit to Supine     Supine to sit: Mod assist Sit to supine: Mod assist   General bed mobility comments: Pt requires heavy cues and hand over hand for sequencing with bed mobility. Once upright at EOB pt requires minA+1 to maintain sitting balance. After approximately 3 minutes pt able to maintain balance with CGA only.   Transfers                 General transfer comment: Pt unwilling to attempt transfers at this time. Performs sliding board transfers at home  Ambulation/Gait                Stairs            Wheelchair Mobility    Modified Rankin (Stroke Patients Only)       Balance Overall balance assessment: Needs assistance Sitting-balance support: No upper extremity supported Sitting balance-Leahy Scale: Fair Sitting balance - Comments: Requires extended time of support before he is able to maintain static sitting balance with CGA only and no UE support  Pertinent Vitals/Pain Pain Assessment: No/denies pain    Home Living Family/patient expects to be discharged to:: Private residence Living Arrangements: Children Available Help at Discharge: Family;Personal care attendant Type of Home: House Home Access: Ramped entrance     Home Layout: One level Home Equipment: Environmental consultantWalker - 2 wheels;Cane - single point;Bedside commode;Other  (comment);Wheelchair - manual (Bed rail)      Prior Function Level of Independence: Needs assistance   Gait / Transfers Assistance Needed: Pt has been performing slide board transfer over the last couple weeks due decline in strength. Prior to that he could help with limited transfers. Pt has been using a wheelchair for a couple years for mobility  ADL's / Homemaking Assistance Needed: Requires assist with ADLs/IADLs        Hand Dominance   Dominant Hand: Right    Extremity/Trunk Assessment   Upper Extremity Assessment: Generalized weakness           Lower Extremity Assessment: Generalized weakness;RLE deficits/detail;LLE deficits/detail RLE Deficits / Details: R knee flexion contracture lacking approximately 45 degrees of extension. Decreased R knee extension strength and hip flexion strength. Able to perform partial SLR without assistance but not full SLR LLE Deficits / Details: L knee flexion contracture lacking approximately 80-90 degrees of extension. L foot in dressing. Avoided DF/PF PROM assessment.      Communication   Communication: No difficulties  Cognition Arousal/Alertness: Awake/alert Behavior During Therapy: WFL for tasks assessed/performed Overall Cognitive Status: Within Functional Limits for tasks assessed                      General Comments      Exercises        Assessment/Plan    PT Assessment Patient needs continued PT services  PT Diagnosis Generalized weakness   PT Problem List Decreased strength;Decreased activity tolerance;Decreased balance;Decreased range of motion;Decreased mobility  PT Treatment Interventions DME instruction;Functional mobility training;Therapeutic activities;Therapeutic exercise;Balance training;Neuromuscular re-education;Cognitive remediation;Patient/family education   PT Goals (Current goals can be found in the Care Plan section) Acute Rehab PT Goals Patient Stated Goal: Be able to perform sliding board  transfers at home PT Goal Formulation: With patient/family Time For Goal Achievement: 11/21/15 Potential to Achieve Goals: Fair Additional Goals Additional Goal #1: Pt will perform sliding board transfers with CGA only [facility]    Frequency 7X/week   Barriers to discharge        Co-evaluation               End of Session Equipment Utilized During Treatment: Gait belt Activity Tolerance: Other (comment) (Limited by motivation to attempt transfers) Patient left: in bed;with call bell/phone within reach;with bed alarm set;with family/visitor present Nurse Communication: Other (comment) (Care manager notified of DC recommendations)         Time: 1530-1600 PT Time Calculation (min) (ACUTE ONLY): 30 min   Charges:   PT Evaluation $PT Eval Moderate Complexity: 1 Procedure     PT G Codes:       Sharalyn InkJason D Lei Dower PT, DPT   Odeal Welden 11/07/2015, 4:49 PM

## 2015-11-08 ENCOUNTER — Inpatient Hospital Stay: Payer: PPO

## 2015-11-08 LAB — BASIC METABOLIC PANEL
ANION GAP: 6 (ref 5–15)
BUN: 14 mg/dL (ref 6–20)
CO2: 26 mmol/L (ref 22–32)
Calcium: 8.4 mg/dL — ABNORMAL LOW (ref 8.9–10.3)
Chloride: 106 mmol/L (ref 101–111)
Creatinine, Ser: 1.12 mg/dL (ref 0.61–1.24)
GFR calc Af Amer: >60 mL/min (ref >60)
GFR, EST NON AFRICAN AMERICAN: 57 mL/min — AB (ref >60)
GLUCOSE: 184 mg/dL — AB (ref 65–99)
POTASSIUM: 4 mmol/L (ref 3.5–5.1)
Sodium: 138 mmol/L (ref 135–145)

## 2015-11-08 LAB — GLUCOSE, CAPILLARY
GLUCOSE-CAPILLARY: 153 mg/dL — AB (ref 65–99)
Glucose-Capillary: 213 mg/dL — ABNORMAL HIGH (ref 65–99)
Glucose-Capillary: 138 mg/dL — ABNORMAL HIGH (ref 65–99)
Glucose-Capillary: 142 mg/dL — ABNORMAL HIGH (ref 65–99)

## 2015-11-08 LAB — C-REACTIVE PROTEIN: CRP: 18.2 mg/dL — ABNORMAL HIGH (ref <1.0)

## 2015-11-08 LAB — VANCOMYCIN, TROUGH: VANCOMYCIN TR: 24 ug/mL — AB (ref 10–20)

## 2015-11-08 MED ORDER — VANCOMYCIN HCL IN DEXTROSE 750-5 MG/150ML-% IV SOLN
750.0000 mg | Freq: Two times a day (BID) | INTRAVENOUS | Status: DC
  Administered 2015-11-08 – 2015-11-09 (×3): 750 mg via INTRAVENOUS
  Filled 2015-11-08 (×4): qty 150

## 2015-11-08 MED ORDER — LACTULOSE 10 GM/15ML PO SOLN
30.0000 g | Freq: Two times a day (BID) | ORAL | Status: DC
  Administered 2015-11-08 – 2015-11-09 (×2): 30 g via ORAL
  Filled 2015-11-08 (×3): qty 60

## 2015-11-08 MED ORDER — SENNA 8.6 MG PO TABS
2.0000 | ORAL_TABLET | Freq: Two times a day (BID) | ORAL | Status: DC
  Administered 2015-11-08 – 2015-11-09 (×3): 17.2 mg via ORAL
  Filled 2015-11-08 (×3): qty 2

## 2015-11-08 NOTE — Progress Notes (Signed)
Pharmacy Antibiotic Note  [name] is a 82 y.o. male admitted on 11/04/2015 with wound infection.  Pharmacy has been consulted for Vancomycin dosing.  Plan: Vancomycin trough 24 mcg/mL. New Ke 0.046 hr-1. Hold x 5 hours until trough estimated to be <20 mcg/mL and restart vancomycin 750 mg IV Q12H, predicted trough 18 mcg/mL. Pharmacy will continue to follow and adjust as needed to maintain trough 15 to 20 mcg/mL.   Height: 6' (182.9 cm) Weight: 187 lb 3.2 oz (84.913 kg) IBW/kg (Calculated) : 77.6  Temp (24hrs), Avg:97.1 F (36.2 C), Min:95.9 F (35.5 C), Max:98.4 F (36.9 C)   Recent Labs Lab 11/04/15 1105 11/05/15 0353  11/07/15 0435 11/07/15 1908 11/08/15 0518  WBC 10.4 10.6  --   --   --   --   CREATININE 1.01 1.00  --  0.78  --  1.12  VANCOTROUGH  --   --   < >  --  29* 24*  < > = values in this interval not displayed.  Estimated Creatinine Clearance: 50 mL/min (by C-G formula based on Cr of 1.12).    No Known Allergies  Antimicrobials this admission:   >> Zosyn 3.375 gm IV Q8H    >> Vancomycin 1 gm IV Q12H   Dose adjustments this admission:   Microbiology results:  BCx:   UCx:    Sputum:    MRSA PCR:   Thank you for allowing pharmacy to be a part of this patient's care.  Carola FrostNathan A Demarius Archila, Pharm.D., BCPS Clinical Pharmacist 11/08/2015 6:13 AM

## 2015-11-08 NOTE — Clinical Social Work Note (Signed)
Clinical Social Work Assessment  Patient Details  Name: [name] MRN: [id number] Date of Birth: 1924/06/18  Date of referral:  11/08/15               Reason for consult:  Facility Placement                Permission sought to share information with:  Family Supports Permission granted to share information::  Yes, Verbal Permission Granted  Name::     [name], daughter   Housing/Transportation Living arrangements for the past 2 months:  [place] of Information:  Patient, Adult Children Patient Interpreter Needed:  None Criminal Activity/Legal Involvement Pertinent to Current Situation/Hospitalization:  No - Comment as needed Significant Relationships:  Adult Children Lives with:  Self Do you feel safe going back to the place where you live?  Yes Need for family participation in patient care:  Yes (Comment)  Care giving concerns: No caregiving concerns identified.   Social Worker assessment / plan:  CSW met with pt and daughter to address consult for SNF. CSW introduced herself and explained role of social work. CSW also explained process of discharging to SNF. PT is recommending SNF and pt has wound vac as well as IV ABX. Pt and daughter are in agreement with discharge plan. CSW initiated a bed search and will follow up with bed offers. CSW will continue to follow.   Employment status:  Retired Nurse, adult PT Recommendations:  [place] / Referral to community resources:  [place]  Patient/Family's Response to care:  Pt and daughter were appreciative of CSW support.   Patient/Family's Understanding of and Emotional Response to Diagnosis, Current Treatment, and Prognosis:  Pt and daughter understand that pt would benefit from STR at [facility].  Emotional Assessment Appearance:  Appears stated age Attitude/Demeanor/Rapport:  Other (Appropriate) Affect (typically observed):  Accepting,  Adaptable Orientation:  Oriented to Self, Oriented to Place, Oriented to  Time, Oriented to Situation Alcohol / Substance use:  Never Used Psych involvement (Current and /or in the community):  No (Comment)  Discharge Needs  Concerns to be addressed:  Adjustment to Illness Readmission within the last 30 days:  No Current discharge risk:  Chronically ill Barriers to Discharge:  Continued Medical Work up   [employer], LCSW 11/08/2015, 5:07 PM

## 2015-11-08 NOTE — Progress Notes (Addendum)
[place] INFECTIOUS DISEASE PROGRESS NOTE Date of Admission:  11/04/2015     ID: [name] is a 82 y.o. male with diabetic foot infection   Active Problems:   Diabetic foot ([place])   Subjective: No fevers, very constipated.  ROS  Eleven systems are reviewed and negative except per hpi  Medications:  Antibiotics Given (last 72 hours)    Date/Time Action Medication Dose Rate   11/05/15 1543 Given   piperacillin-tazobactam (ZOSYN) IVPB 3.375 g 3.375 g 12.5 mL/hr   11/05/15 1701 Given   vancomycin (VANCOCIN) IVPB 750 mg/150 ml premix 750 mg 150 mL/hr   11/05/15 2134 Given   piperacillin-tazobactam (ZOSYN) IVPB 3.375 g 3.375 g 12.5 mL/hr   11/06/15 3094 Given   vancomycin (VANCOCIN) IVPB 1000 mg/200 mL premix 1,000 mg 200 mL/hr   11/06/15 0952 Given   piperacillin-tazobactam (ZOSYN) IVPB 3.375 g 3.375 g 12.5 mL/hr   11/06/15 1723 Given   piperacillin-tazobactam (ZOSYN) IVPB 3.375 g 3.375 g 12.5 mL/hr   11/06/15 1723 Given   vancomycin (VANCOCIN) IVPB 1000 mg/200 mL premix 1,000 mg 200 mL/hr   11/06/15 2306 Given   piperacillin-tazobactam (ZOSYN) IVPB 3.375 g 3.375 g 12.5 mL/hr   11/07/15 0527 Given   vancomycin (VANCOCIN) IVPB 1000 mg/200 mL premix 1,000 mg 200 mL/hr   11/07/15 0854 Given   piperacillin-tazobactam (ZOSYN) IVPB 3.375 g 3.375 g 12.5 mL/hr   11/07/15 1611 Given   piperacillin-tazobactam (ZOSYN) IVPB 3.375 g 3.375 g 12.5 mL/hr   11/07/15 1829 Given   vancomycin (VANCOCIN) IVPB 1000 mg/200 mL premix 1,000 mg 200 mL/hr   11/07/15 2211 Given   piperacillin-tazobactam (ZOSYN) IVPB 3.375 g 3.375 g 12.5 mL/hr   11/08/15 0759 Given   piperacillin-tazobactam (ZOSYN) IVPB 3.375 g 3.375 g 12.5 mL/hr   11/08/15 0952 Given   vancomycin (VANCOCIN) IVPB 750 mg/150 ml premix 750 mg 150 mL/hr     . amiodarone  200 mg Oral Daily  . aspirin EC  81 mg Oral Daily  . carbidopa-levodopa  1 tablet Oral QID  . Carbidopa-Levodopa ER  1 tablet Oral BID  . Chlorhexidine  Gluconate Cloth  6 each Topical Q0600  . clopidogrel  75 mg Oral Daily  . [START ON 12/03/2015] cyanocobalamin  1,000 mcg Intramuscular Q30 days  . docusate sodium  100 mg Oral BID  . feeding supplement (GLUCERNA SHAKE)  237 mL Oral TID BM  . heparin  5,000 Units Subcutaneous Q8H  . insulin aspart  0-9 Units Subcutaneous TID WC  . lactulose  30 g Oral BID  . levothyroxine  150 mcg Oral QAC breakfast  . linagliptin  5 mg Oral Daily  . loratadine  10 mg Oral Daily  . mupirocin ointment  1 application Nasal BID  . piperacillin-tazobactam (ZOSYN)  IV  3.375 g Intravenous Q8H  . polyethylene glycol  17 g Oral Daily  . potassium chloride  10 mEq Oral Daily  . pramipexole  0.5 mg Oral TID  . senna  2 tablet Oral BID  . tamsulosin  0.4 mg Oral QPC supper  . vancomycin  750 mg Intravenous Q12H    Objective: Vital signs in last 24 hours: Temp:  [95.9 F (35.5 C)-98.4 F (36.9 C)] 98.4 F (36.9 C) (06/06 0745) Pulse Rate:  [60-62] 60 (06/06 0745) Resp:  [18] 18 (06/06 0745) BP: (116-122)/(48-58) 118/58 mmHg (06/06 0745) SpO2:  [94 %-96 %] 96 % (06/06 0745) Constitutional: He is oriented to person, place, and time. Frail appearing, hoh  HENT: anicteric Mouth/Throat: Oropharynx is clear and moist. No oropharyngeal exudate.  Cardiovascular: Normal rate, regular rhythm and normal heart sounds.Pulmonary/Chest: Effort normal and breath sounds normal. No respiratory distress. He has no wheezes.  Abdominal: Soft. Bowel sounds are normal. He exhibits no distension. There is no tenderness.  Lymphadenopathy:  He has no cervical adenopathy. Neurological: He is alert and oriented to person, place, and time.  Skin: Skin is warm and dry. No rash noted. No erythema.  Psychiatric: He has a normal mood and affect. His behavior is normal.  L foot s/p surgery with well approximated medial incision with no drainage, sutures in place. Medial heal with shallow erythema   Lab Results  Recent Labs   11/07/15 0435 11/08/15 0518  NA  --  138  K  --  4.0  CL  --  106  CO2  --  26  BUN  --  14  CREATININE 0.78 1.12    Microbiology: Results for orders placed or performed during the hospital encounter of 11/04/15  Surgical pcr screen     Status: Abnormal   Collection Time: 11/04/15  6:11 PM  Result Value Ref Range Status   MRSA, PCR POSITIVE (A) NEGATIVE Final    Comment: CRITICAL RESULT CALLED TO, READ BACK BY AND VERIFIED WITH: MARY RAYMOND AT 2324 11/04/15.PMH    Staphylococcus aureus POSITIVE (A) NEGATIVE Final    Comment:        The Xpert SA Assay (FDA approved for NASAL specimens in patients over 55 years of age), is one component of a comprehensive surveillance program.  Test performance has been validated by [facility] for patients greater than or equal to 80 year old. It is not intended to diagnose infection nor to guide or monitor treatment.   Anaerobic culture     Status: None (Preliminary result)   Collection Time: 11/05/15 12:17 PM  Result Value Ref Range Status   Specimen Description WOUND  Final   Special Requests NONE  Final   Culture NO ANAEROBES ISOLATED  Final   Report Status PENDING  Incomplete  WOUND CULTURE ([facility] ONLY)     Status: None   Collection Time: 11/05/15 12:17 PM  Result Value Ref Range Status   Specimen Description WOUND  Final   Special Requests NONE  Final   Gram Stain   Final    FEW RED BLOOD CELLS RARE WBC SEEN NO ORGANISMS SEEN    Culture   Final    LIGHT GROWTH PROTEUS MIRABILIS LIGHT GROWTH STAPHYLOCOCCUS AUREUS    Report Status 11/08/2015 FINAL  Final   Organism ID, Bacteria PROTEUS MIRABILIS  Final   Organism ID, Bacteria PROTEUS MIRABILIS  Final   Organism ID, Bacteria STAPHYLOCOCCUS AUREUS  Final   Organism ID, Bacteria STAPHYLOCOCCUS AUREUS  Final      Susceptibility   Staphylococcus aureus - MIC*    CEFAZOLIN RESISTANT Resistant     LEVOFLOXACIN RESISTANT Resistant     OXACILLIN RESISTANT Resistant      TRIMETH/SULFA SENSITIVE Sensitive     VANCOMYCIN SENSITIVE Sensitive     TETRACYCLINE SENSITIVE Sensitive    Staphylococcus aureus - MIC*    CIPROFLOXACIN >=8 RESISTANT Resistant     ERYTHROMYCIN >=8 RESISTANT Resistant     GENTAMICIN <=0.5 SENSITIVE Sensitive     OXACILLIN >=4 RESISTANT Resistant     TETRACYCLINE <=1 SENSITIVE Sensitive     VANCOMYCIN 1 SENSITIVE Sensitive     TRIMETH/SULFA <=10 SENSITIVE Sensitive     CLINDAMYCIN >=  8 RESISTANT Resistant     RIFAMPIN <=0.5 SENSITIVE Sensitive     Inducible Clindamycin NEGATIVE Sensitive     * LIGHT GROWTH STAPHYLOCOCCUS AUREUS    LIGHT GROWTH STAPHYLOCOCCUS AUREUS   Proteus mirabilis - MIC*    AMPICILLIN RESISTANT Resistant     AMPICILLIN/SULBACTAM RESISTANT Resistant     CEFAZOLIN RESISTANT Resistant     CIPROFLOXACIN SENSITIVE Sensitive     GENTAMICIN SENSITIVE Sensitive     IMIPENEM SENSITIVE Sensitive     TRIMETH/SULFA SENSITIVE Sensitive    Proteus mirabilis - MIC*    AMPICILLIN >=32 RESISTANT Resistant     CEFAZOLIN >=64 RESISTANT Resistant     CEFEPIME <=1 RESISTANT Resistant     CEFTAZIDIME 16 RESISTANT Resistant     CEFTRIAXONE 8 RESISTANT Resistant     CIPROFLOXACIN <=0.25 SENSITIVE Sensitive     GENTAMICIN <=1 SENSITIVE Sensitive     IMIPENEM 1 SENSITIVE Sensitive     TRIMETH/SULFA <=20 SENSITIVE Sensitive     AMPICILLIN/SULBACTAM >=32 RESISTANT Resistant     PIP/TAZO <=4 SENSITIVE Sensitive     * LIGHT GROWTH PROTEUS MIRABILIS    LIGHT GROWTH PROTEUS MIRABILIS    Studies/Results: No results found.  Assessment/Plan: HAGEN BOHORQUEZ is a 82 y.o. male with DM, PVD s/p revascularization admitted with worsening of chronic wound on L medial foot and a medial ankle ulcer, s/p surgical amputatton first ray and abx placement 6/3, prior cx with MRSA, enterococcus and proteus.  current cx with MRSA, and a resistant proteus. It appears most of the infected bone has been removed however I think he would benefit from a  10-14 day course of IV abx to ensure resolution. ESR 17, CRP 18.2 Recommendations Continue vanco and zosyn since the proteus was resistant to ceftriaxone  This will cover MRSA, enteroccus and the proteus. I have updated the abx order sheet to change the ceftriaxone to zosyn I can see in 1-2 weeks prior to stopping IV abx Thank you very much for the consult. Will follow with you.  Eria Lozoya P   11/08/2015, 2:14 PM

## 2015-11-08 NOTE — [place] FL2 (Signed)
[place] MEDICAID FL2 LEVEL OF CARE SCREENING TOOL     IDENTIFICATION  Patient Name: [name] Birthdate: February 17, 1925 Sex: male Admission Date (Current Location): 11/04/2015  [place]   Facility and Address:  [facility], [address], [place], [place] [address]      Provider Number: [id number]  Attending Physician Name and Address:  Auburn BilberryShreyang Patel, MD  Relative Name and Phone Number:       Current Level of Care: Hospital Recommended Level of Care: Skilled Nursing Facility Prior Approval Number:    Date Approved/Denied:   PASRR Number: [id number] [id number]  Discharge Plan: SNF    Current Diagnoses: Patient Active Problem List   Diagnosis Date Noted  . Diabetic foot (HCC) 11/04/2015  . PAD (peripheral artery disease) (HCC) 10/28/2015  . Diabetes mellitus without complication (HCC) 07/11/2015  . Clavicle enlargement 07/11/2015  . Poorly controlled type 2 diabetes mellitus (HCC) 06/23/2014  . Bed sore on heel, right, unstageable (HCC) 09/23/2013  . Coronary atherosclerosis of native coronary artery 03/27/2013  . Chronic diastolic CHF (congestive heart failure) (HCC) 03/27/2013  . Aortic valve stenosis 03/27/2013  . Essential hypertension 03/27/2013  . Hyperlipidemia 03/27/2013  . Atrial fibrillation (HCC) 03/27/2013  . Bilateral leg edema 03/27/2013  . Constipation 03/27/2013    Orientation RESPIRATION BLADDER Height & Weight     Self, Situation, Place, Time  Normal Continent Weight: 187 lb 3.2 oz (84.913 kg) Height:  6' (182.9 cm)  BEHAVIORAL SYMPTOMS/MOOD NEUROLOGICAL BOWEL NUTRITION STATUS   (none)  (none) Continent Diet (carb modified;fluid consistency)  AMBULATORY STATUS COMMUNICATION OF NEEDS Skin   Extensive Assist Verbally Surgical wounds                       Personal Care Assistance Level of Assistance  Bathing, Dressing Bathing Assistance: Limited assistance   Dressing Assistance: Limited  assistance     Functional Limitations Info  Sight Sight Info: Impaired        SPECIAL CARE FACTORS FREQUENCY  PT (By licensed PT)                    Contractures Contractures Info: Not present    Additional Factors Info  Code Status, Allergies, Isolation Precautions Code Status Info: full Allergies Info: nka           Current Medications (11/08/2015):  This is the current hospital active medication list Current Facility-Administered Medications  Medication Dose Route Frequency Provider Last Rate Last Dose  . amiodarone (PACERONE) tablet 200 mg  200 mg Oral Daily Altamese DillingVaibhavkumar Vachhani, MD   200 mg at 11/08/15 0953  . aspirin EC tablet 81 mg  81 mg Oral Daily Altamese DillingVaibhavkumar Vachhani, MD   81 mg at 11/08/15 0953  . carbidopa-levodopa (SINEMET IR) 25-100 MG per tablet immediate release 1 tablet  1 tablet Oral QID Altamese DillingVaibhavkumar Vachhani, MD   1 tablet at 11/08/15 1348  . Carbidopa-Levodopa ER (SINEMET CR) 25-100 MG tablet controlled release 1 tablet  1 tablet Oral BID Altamese DillingVaibhavkumar Vachhani, MD   1 tablet at 11/08/15 0953  . Chlorhexidine Gluconate Cloth 2 % PADS 6 each  6 each Topical Q0600 Auburn BilberryShreyang Patel, MD   6 each at 11/08/15 0535  . clopidogrel (PLAVIX) tablet 75 mg  75 mg Oral Daily Altamese DillingVaibhavkumar Vachhani, MD   75 mg at 11/08/15 0953  . [START ON 12/03/2015] cyanocobalamin ((VITAMIN B-12)) injection 1,000 mcg  1,000 mcg Intramuscular Q30 days Altamese DillingVaibhavkumar Vachhani,  MD      . docusate sodium (COLACE) capsule 100 mg  100 mg Oral BID Auburn Bilberry, MD   100 mg at 11/08/15 0953  . feeding supplement (GLUCERNA SHAKE) (GLUCERNA SHAKE) liquid 237 mL  237 mL Oral TID BM Auburn Bilberry, MD   237 mL at 11/08/15 1400  . guaiFENesin-dextromethorphan (ROBITUSSIN DM) 100-10 MG/5ML syrup 5 mL  5 mL Oral Q4H PRN Auburn Bilberry, MD   5 mL at 11/08/15 1008  . heparin injection 5,000 Units  5,000 Units Subcutaneous Q8H Altamese Dilling, MD   5,000 Units at 11/08/15 1348  . insulin aspart  (novoLOG) injection 0-9 Units  0-9 Units Subcutaneous TID WC Altamese Dilling, MD   3 Units at 11/08/15 1140  . lactulose (CHRONULAC) 10 GM/15ML solution 30 g  30 g Oral BID Auburn Bilberry, MD   30 g at 11/08/15 1140  . levothyroxine (SYNTHROID, LEVOTHROID) tablet 150 mcg  150 mcg Oral QAC breakfast Altamese Dilling, MD   150 mcg at 11/08/15 0759  . linagliptin (TRADJENTA) tablet 5 mg  5 mg Oral Daily Auburn Bilberry, MD   5 mg at 11/08/15 0953  . loratadine (CLARITIN) tablet 10 mg  10 mg Oral Daily Auburn Bilberry, MD   10 mg at 11/08/15 0953  . mupirocin ointment (BACTROBAN) 2 % 1 application  1 application Nasal BID Auburn Bilberry, MD   1 application at 11/08/15 0954  . oxyCODONE-acetaminophen (PERCOCET/ROXICET) 5-325 MG per tablet 1 tablet  1 tablet Oral Q4H PRN Recardo Evangelist, DPM   1 tablet at 11/08/15 0759  . piperacillin-tazobactam (ZOSYN) IVPB 3.375 g  3.375 g Intravenous Q8H Melissa D Maccia, RPH   3.375 g at 11/08/15 0759  . polyethylene glycol (MIRALAX / GLYCOLAX) packet 17 g  17 g Oral Daily Auburn Bilberry, MD   17 g at 11/08/15 1610  . potassium chloride (K-DUR,KLOR-CON) CR tablet 10 mEq  10 mEq Oral Daily Altamese Dilling, MD   10 mEq at 11/08/15 0953  . pramipexole (MIRAPEX) tablet 0.5 mg  0.5 mg Oral TID Altamese Dilling, MD   0.5 mg at 11/08/15 0952  . senna (SENOKOT) tablet 17.2 mg  2 tablet Oral BID Auburn Bilberry, MD   17.2 mg at 11/08/15 1140  . tamsulosin (FLOMAX) capsule 0.4 mg  0.4 mg Oral QPC supper Altamese Dilling, MD   0.4 mg at 11/07/15 1823  . vancomycin (VANCOCIN) IVPB 750 mg/150 ml premix  750 mg Intravenous Q12H Auburn Bilberry, MD   750 mg at 11/08/15 9604     Discharge Medications: Please see discharge summary for a list of discharge medications.  Relevant Imaging Results:  Relevant Lab Results:   Additional Information SS: [id number]  York Spaniel, LCSW

## 2015-11-08 NOTE — Care Management (Signed)
Notified by RN that patient's daughter wants patient to go to SNF. I have updated CSW and Barbara CowerJason with [facility]. RNCM will continue to follow. Discharge planned for tomorrow.

## 2015-11-08 NOTE — Progress Notes (Addendum)
Infectious Disease Long Term IV Antibiotic Orders  Diagnosis: L foot infection  Culture results prior cx with MRSA, enterococcus and proteus.    Allergies: No Known Allergies  Discharge antibiotics Vancomycin                750  mg  every     12          hours .     Goal vancomycin trough 15-20.    Pharmacy to adjust dosing based on levels Zosyn 3.375 q 8 hours   PICC Care per protocol Labs weekly while on IV antibiotics      CBC w diff   Comprehensive met panel Vancomycin Trough   CRP   Planned duration of antibiotics 14 days  Stop date June 17  Follow up clinic date TBD  FAX weekly labs to [phone]  Leonel [name], MD

## 2015-11-08 NOTE — Progress Notes (Signed)
[facility] [place] [facility]                                                                                                                                                                                            Patient Demographics   [name], is a 82 y.o. male, DOB - December 09, 1924, UJW:[id number]  Admit date - 11/04/2015   Admitting Physician Altamese Dilling, MD  Outpatient Primary MD for the patient is Vonita Moss, MD   LOS - 4  Subjective: C/o constipation, feels weak, not much apeitite  Review of Systems:   CONSTITUTIONAL: No documented fever. No fatigue,  Positive weakness. No weight gain, no weight loss.  EYES: No blurry or double vision.  ENT: No tinnitus. No postnasal drip. No redness of the oropharynx.  RESPIRATORY: No cough, no wheeze, no hemoptysis. No dyspnea.  CARDIOVASCULAR: No chest pain. No orthopnea. No palpitations. No syncope.  GASTROINTESTINAL: No nausea, no vomiting or diarrhea. No abdominal pain. No melena or hematochezia. Positive constipation GENITOURINARY: No dysuria or hematuria.  ENDOCRINE: No polyuria or nocturia. No heat or cold intolerance.  HEMATOLOGY: No anemia. No bruising. No bleeding.  INTEGUMENTARY: No rashes. No lesions.Wound VAC in place MUSCULOSKELETAL: No arthritis. No swelling. No gout.  NEUROLOGIC: No numbness, tingling, or ataxia. No seizure-type activity.  PSYCHIATRIC: No anxiety. No insomnia. No ADD.    Vitals:   Filed Vitals:   11/07/15 1616 11/07/15 1933 11/08/15 0542 11/08/15 0745  BP: 118/50 116/48 122/54 118/58  Pulse: 62 62 60 60  Temp: 95.9 F (35.5 C) 96.5 F (35.8 C) 97.6 F (36.4 C) 98.4 F (36.9 C)  TempSrc: Oral Oral Oral Oral  Resp: Height:      Weight:      SpO2: 94% 96% 94% 96%    Wt Readings from Last 3 Encounters:  11/04/15 84.913 kg (187 lb 3.2 oz)  08/30/15 89.359 kg (197 lb)  07/29/15 88.451 kg (195 lb)     Intake/Output Summary (Last 24  hours) at 11/08/15 1122 Last data filed at 11/08/15 0939  Gross per 24 hour  Intake    750 ml  Output    750 ml  Net      0 ml    Physical Exam:   GENERAL: Pleasant-appearing in no apparent distress.  HEAD, EYES, EARS, NOSE AND THROAT: Atraumatic, normocephalic. Extraocular muscles are intact. Pupils equal and reactive to light. Sclerae anicteric. No conjunctival injection. No oro-pharyngeal erythema.  NECK: Supple. There is no jugular venous distention. No bruits, no lymphadenopathy, no thyromegaly.  HEART: Regular rate and rhythm,. No  murmurs, no rubs, no clicks.  LUNGS: Clear to auscultation bilaterally. No rales or rhonchi. No wheezes.  ABDOMEN: Soft, flat, nontender, nondistended. Has good bowel sounds. No hepatosplenomegaly appreciated.  EXTREMITIES: Left lower extremity with a wound VAC  NEUROLOGIC: The patient is alert, awake, and oriented x3 with no focal motor or sensory deficits appreciated bilaterally.  SKIN: Moist and warm with no rashes appreciated.  Psych: Not anxious, depressed LN: No inguinal LN enlargement    Antibiotics   Anti-infectives    Start     Dose/Rate Route Frequency Ordered Stop   11/08/15 1000  vancomycin (VANCOCIN) IVPB 750 mg/150 ml premix     750 mg 150 mL/hr over 60 Minutes Intravenous Every 12 hours 11/08/15 0612     11/06/15 0600  vancomycin (VANCOCIN) IVPB 1000 mg/200 mL premix  Status:  Discontinued     1,000 mg 200 mL/hr over 60 Minutes Intravenous Every 12 hours 11/06/15 0545 11/08/15 0612   11/04/15 1730  vancomycin (VANCOCIN) IVPB 750 mg/150 ml premix  Status:  Discontinued     750 mg 150 mL/hr over 60 Minutes Intravenous Every 12 hours 11/04/15 1400 11/06/15 0545   11/04/15 1530  piperacillin-tazobactam (ZOSYN) IVPB 3.375 g     3.375 g 12.5 mL/hr over 240 Minutes Intravenous Every 8 hours 11/04/15 1400     11/04/15 1100  vancomycin (VANCOCIN) IVPB 1000 mg/200 mL premix     1,000 mg 200 mL/hr over 60 Minutes Intravenous  Once 11/04/15  1048 11/04/15 1316   11/04/15 1100  piperacillin-tazobactam (ZOSYN) IVPB 3.375 g     3.375 g 100 mL/hr over 30 Minutes Intravenous  Once 11/04/15 1054 11/04/15 1316      Medications   Scheduled Meds: . amiodarone  200 mg Oral Daily  . aspirin EC  81 mg Oral Daily  . carbidopa-levodopa  1 tablet Oral QID  . Carbidopa-Levodopa ER  1 tablet Oral BID  . Chlorhexidine Gluconate Cloth  6 each Topical Q0600  . clopidogrel  75 mg Oral Daily  . [START ON 12/03/2015] cyanocobalamin  1,000 mcg Intramuscular Q30 days  . docusate sodium  100 mg Oral BID  . feeding supplement (GLUCERNA SHAKE)  237 mL Oral TID BM  . heparin  5,000 Units Subcutaneous Q8H  . insulin aspart  0-9 Units Subcutaneous TID WC  . levothyroxine  150 mcg Oral QAC breakfast  . linagliptin  5 mg Oral Daily  . loratadine  10 mg Oral Daily  . mupirocin ointment  1 application Nasal BID  . piperacillin-tazobactam (ZOSYN)  IV  3.375 g Intravenous Q8H  . polyethylene glycol  17 g Oral Daily  . potassium chloride  10 mEq Oral Daily  . pramipexole  0.5 mg Oral TID  . tamsulosin  0.4 mg Oral QPC supper  . vancomycin  750 mg Intravenous Q12H   Continuous Infusions:  PRN Meds:.guaiFENesin-dextromethorphan, oxyCODONE-acetaminophen, senna   Data Review:   Micro Results Recent Results (from the past 240 hour(s))  WOUND CULTURE ([facility] ONLY)     Status: None (Preliminary result)   Collection Time: 11/01/15  1:20 PM  Result Value Ref Range Status   Specimen Description HEEL  Final   Special Requests LEFT  Final   Gram Stain RARE WBC SEEN NO ORGANISMS SEEN   Final   Culture   Final    LIGHT GROWTH PROTEUS MIRABILIS LIGHT GROWTH METHICILLIN RESISTANT STAPHYLOCOCCUS AUREUS MODERATE GROWTH ENTEROCOCCUS FAECALIS REPEATING SUSCEPTIBILITIES    Report Status PENDING  Incomplete   Organism  ID, Bacteria PROTEUS MIRABILIS  Final   Organism ID, Bacteria METHICILLIN RESISTANT STAPHYLOCOCCUS AUREUS  Final      Susceptibility   Proteus  mirabilis - MIC*    AMPICILLIN <=2 SENSITIVE Sensitive     CEFAZOLIN <=4 SENSITIVE Sensitive     CEFEPIME <=1 SENSITIVE Sensitive     CEFTAZIDIME <=1 SENSITIVE Sensitive     CEFTRIAXONE <=1 SENSITIVE Sensitive     CIPROFLOXACIN <=0.25 SENSITIVE Sensitive     GENTAMICIN <=1 SENSITIVE Sensitive     IMIPENEM 2 SENSITIVE Sensitive     TRIMETH/SULFA <=20 SENSITIVE Sensitive     AMPICILLIN/SULBACTAM <=2 SENSITIVE Sensitive     PIP/TAZO <=4 SENSITIVE Sensitive     * LIGHT GROWTH PROTEUS MIRABILIS   Methicillin resistant staphylococcus aureus - MIC*    CIPROFLOXACIN >=8 RESISTANT Resistant     ERYTHROMYCIN >=8 RESISTANT Resistant     GENTAMICIN <=0.5 SENSITIVE Sensitive     OXACILLIN >=4 RESISTANT Resistant     TETRACYCLINE 2 SENSITIVE Sensitive     VANCOMYCIN 1 SENSITIVE Sensitive     TRIMETH/SULFA <=10 SENSITIVE Sensitive     CLINDAMYCIN >=8 RESISTANT Resistant     RIFAMPIN <=0.5 SENSITIVE Sensitive     Inducible Clindamycin NEGATIVE Sensitive     * LIGHT GROWTH METHICILLIN RESISTANT STAPHYLOCOCCUS AUREUS  Surgical pcr screen     Status: Abnormal   Collection Time: 11/04/15  6:11 PM  Result Value Ref Range Status   MRSA, PCR POSITIVE (A) NEGATIVE Final    Comment: CRITICAL RESULT CALLED TO, READ BACK BY AND VERIFIED WITH: MARY RAYMOND AT 2324 11/04/15.PMH    Staphylococcus aureus POSITIVE (A) NEGATIVE Final    Comment:        The Xpert SA Assay (FDA approved for NASAL specimens in patients over 55 years of age), is one component of a comprehensive surveillance program.  Test performance has been validated by [facility] for patients greater than or equal to 44 year old. It is not intended to diagnose infection nor to guide or monitor treatment.   Anaerobic culture     Status: None (Preliminary result)   Collection Time: 11/05/15 12:17 PM  Result Value Ref Range Status   Specimen Description WOUND  Final   Special Requests NONE  Final   Culture NO ANAEROBES ISOLATED  Final    Report Status PENDING  Incomplete  WOUND CULTURE ([facility] ONLY)     Status: None (Preliminary result)   Collection Time: 11/05/15 12:17 PM  Result Value Ref Range Status   Specimen Description WOUND  Final   Special Requests NONE  Final   Gram Stain   Final    FEW RED BLOOD CELLS RARE WBC SEEN NO ORGANISMS SEEN    Culture   Final    LIGHT GROWTH PROTEUS MIRABILIS REPEATING SUSCEPTIBILITIES    Report Status PENDING  Incomplete    Radiology Reports Dg Foot Complete Left  11/04/2015  CLINICAL DATA:  Medial forefoot and medial heel ulcers. EXAM: LEFT FOOT - COMPLETE 3+ VIEW COMPARISON:  07/22/2015 FINDINGS: Marked hallux valgus deformity identified. Status post osteotomy of the distal aspect of the fifth metatarsal. Soft tissue ulceration overlying in the first MTP joint is identified. No underlying bone erosion identified. Hammertoe deformities are present. Diffuse soft tissue swelling. IMPRESSION: 1. Soft tissue ulceration overlying the first MTP joint. No underlying bone erosion noted. 2. Chronic changes as above Electronically Signed   By: Signa Kell M.D.   On: 11/04/2015 12:11  CBC  Recent Labs Lab 11/04/15 1105 11/05/15 0353  WBC 10.4 10.6  HGB 12.3* 11.6*  HCT 36.6* 33.9*  PLT 182 169  MCV 90.1 89.2  MCH 30.4 30.4  MCHC 33.7 34.1  RDW 14.7* 14.5  LYMPHSABS 1.0  --   MONOABS 0.8  --   EOSABS 0.1  --   BASOSABS 0.0  --     Chemistries   Recent Labs Lab 11/04/15 1105 11/05/15 0353 11/07/15 0435 11/08/15 0518  NA 138 138  --  138  K 3.9 3.9  --  4.0  CL 101 105  --  106  CO2 29 26  --  26  GLUCOSE 164* 199*  --  184*  BUN 24* 21*  --  14  CREATININE 1.01 1.00 0.78 1.12  CALCIUM 8.7* 8.4*  --  8.4*   ------------------------------------------------------------------------------------------------------------------ estimated creatinine clearance is 50 mL/min (by C-G formula based on Cr of  1.12). ------------------------------------------------------------------------------------------------------------------ No results for input(s): HGBA1C in the last 72 hours. ------------------------------------------------------------------------------------------------------------------ No results for input(s): CHOL, HDL, LDLCALC, TRIG, CHOLHDL, LDLDIRECT in the last 72 hours. ------------------------------------------------------------------------------------------------------------------ No results for input(s): TSH, T4TOTAL, T3FREE, THYROIDAB in the last 72 hours.  Invalid input(s): FREET3 ------------------------------------------------------------------------------------------------------------------ No results for input(s): VITAMINB12, FOLATE, FERRITIN, TIBC, IRON, RETICCTPCT in the last 72 hours.  Coagulation profile No results for input(s): INR, PROTIME in the last 168 hours.  No results for input(s): DDIMER in the last 72 hours.  Cardiac Enzymes No results for input(s): CKMB, TROPONINI, MYOGLOBIN in the last 168 hours.  Invalid input(s): CK ------------------------------------------------------------------------------------------------------------------ Invalid input(s): POCBNP    Assessment & Plan  Patient is a 82 year old with a diabetic foot ulcer * Diabetic foot with peripheral vascular disease  wound cultures showing Proteus previous wound culture with MRSA, entorococcus Appreciate infectious disease input picc today Continue current antibiotics  * Diabetes Continue tradjenta  * Peripheral arterial disease  Vascular consult appreciated  * Parkinson's disease  Continue carbidopa levodopa.  * Coronary artery disease   We'll continue aspirin and Plavix and amiodarone.   *Constipation still no bm add lacutlose and suppo  *Hypothyroidism continue Synthroid  *Miscellaneous heparin for DVT prophylaxis   Pt family would like hime to go to rehab d/w sw  plan for dc tomm    Code Status Orders        Start     Ordered   11/04/15 1400  Full code   Continuous     11/04/15 1400    Code Status History    Date Active Date Inactive Code Status Order ID Comments User Context   11/04/2015 12:44 PM 11/04/2015  2:00 PM DNR [id number]  Altamese Dilling, MD ED    Advance Directive Documentation        Most Recent Value   Type of Advance Directive  Healthcare Power of Attorney   Pre-existing out of facility DNR order (yellow form or pink MOST form)     "MOST" Form in Place?             Consults  Podiatry and vascular surgery DVT Prophylaxis   Heparin  Lab Results  Component Value Date   PLT 169 11/05/2015     Time Spent in minutes   Greater than 50% of time spent in care coordination and counseling patient regarding the condition and plan of care.   Auburn Bilberry M.D on 11/08/2015 at 11:22 AM  Between 7am to 6pm - Pager - [phone]  After 6pm go to [email or web address] - password  [facility]  [place] [facility]   Office  [phone]

## 2015-11-08 NOTE — Progress Notes (Signed)
Physical Therapy Treatment Patient Details Name: [name] MRN: [id number] [name] DOB: [birth year] Today's Date: 11/08/2015    History of Present Illness [name] is a 82 y.o. male with a known history of coronary artery disease, diabetes, aortic stenosis, hypertension, hyperlipidemia, thyroid disease, hypothyroidism- has chronic circulatory issue in both lower extremities and he had undergone surgery by vascular Dr.Schiener- successful restoration of the circulation on the right lower extremity but on the left after 3-4 surgeries there was not successful circulation up to her foot. He follows with the phone the clinic and podiatry clinic for his chronic wound on the left foot. Was recently given a course of antibiotic, which he finished a few weeks ago. For last few days his wound on the right foot started turning red again and had some discharge, so he went to podiatry clinic yesterday and after looking at his wound Dr. Graciela [name] advised him to go to emergency room. Pt underwent L first ray amputation and is POD#2 at time of PT evaluation    PT Comments    Pt currently on bed pan and finished. Pt assisted in bed mobility for removal of bed pan and personal hygiene/change as well as repositioning in bed. Attempted sitting up/transfers as well as bed exercises; pt refuses any additional PT. Pt notes feeling weak, as he has been unable to eat. Pt educated on proper nutrition and importance for healing. Suggestions given. Pt does demonstrate negative attitude requesting "poison" and stating to "just shoot me". Pt refuses any need for pastoral services or other social service assist. Family member notes attitude is not serious. Pt/family made aware that services are available if needed. Recommend discharge changed to skilled nursing facility, as pt demonstrating little to no progression in functional mobility. Social work aware. Continue PT to encourage/progress participation in strengthening and functional  mobility.   Follow Up Recommendations  SNF     Equipment Recommendations       Recommendations for Other Services       Precautions / Restrictions Precautions Precautions: Fall Restrictions Weight Bearing Restrictions: Yes LLE Weight Bearing: Non weight bearing    Mobility  Bed Mobility Overal bed mobility: Needs Assistance Bed Mobility: Rolling Rolling: Mod assist (R and L; for personal hygiene)         General bed mobility comments: Refuses up in bed. Requires Max A x 2 for repositioning upward in bed.  Transfers                 General transfer comment: Refuses out of bed  Ambulation/Gait                 Stairs            Wheelchair Mobility    Modified Rankin (Stroke Patients Only)       Balance                                    Cognition Arousal/Alertness: Awake/alert Behavior During Therapy: WFL for tasks assessed/performed Overall Cognitive Status: Within Functional Limits for tasks assessed                      Exercises Other Exercises Other Exercises: Refuses attempts at exercises, noting weakness as pt not eating well Other Exercises: Education on proper nutrition for healing especially as pt not becoming ill, just no appetite.    General Comments  Pertinent Vitals/Pain Pain Assessment:  (Does not quantify, but notes LLE/foot hurts)    Home Living                      Prior Function            PT Goals (current goals can now be found in the care plan section) Progress towards PT goals: Not progressing toward goals - comment    Frequency  7X/week    PT Plan Discharge plan needs to be updated    Co-evaluation             End of Session   Activity Tolerance: Patient limited by fatigue;Other (comment) (weakness, poor attitude) Patient left: in bed;with call bell/phone within reach;with family/visitor present;with SCD's reapplied (MD in room)     Time:  3329-5188 PT Time Calculation (min) (ACUTE ONLY): 15 min  Charges:  $Therapeutic Activity: 8-22 mins                    G Codes:      [name], PTA 11/08/2015, 2:30 PM

## 2015-11-08 NOTE — Plan of Care (Signed)
Problem: Bowel/Gastric: Goal: Will not experience complications related to bowel motility Outcome: Progressing Pt passing gas. Stool softener and prune juice given.

## 2015-11-09 ENCOUNTER — Ambulatory Visit: Payer: PPO | Admitting: Internal Medicine

## 2015-11-09 LAB — WOUND CULTURE

## 2015-11-09 LAB — CBC
HEMATOCRIT: 32 % — AB (ref 40.0–52.0)
Hemoglobin: 10.8 g/dL — ABNORMAL LOW (ref 13.0–18.0)
MCH: 30.8 pg (ref 26.0–34.0)
MCHC: 33.7 g/dL (ref 32.0–36.0)
MCV: 91.3 fL (ref 80.0–100.0)
PLATELETS: 199 10*3/uL (ref 150–440)
RBC: 3.5 MIL/uL — ABNORMAL LOW (ref 4.40–5.90)
RDW: 14.8 % — AB (ref 11.5–14.5)
WBC: 6.3 10*3/uL (ref 3.8–10.6)

## 2015-11-09 LAB — GLUCOSE, CAPILLARY
GLUCOSE-CAPILLARY: 119 mg/dL — AB (ref 65–99)
GLUCOSE-CAPILLARY: 166 mg/dL — AB (ref 65–99)

## 2015-11-09 LAB — SURGICAL PATHOLOGY

## 2015-11-09 MED ORDER — VANCOMYCIN HCL IN DEXTROSE 750-5 MG/150ML-% IV SOLN: 750.0000 mg | Freq: Two times a day (BID) | INTRAVENOUS | Status: AC

## 2015-11-09 MED ORDER — GLUCERNA SHAKE PO LIQD: 237.0000 mL | Freq: Three times a day (TID) | ORAL | Status: AC

## 2015-11-09 MED ORDER — PIPERACILLIN-TAZOBACTAM 3.375 G IVPB: 3.3750 g | Freq: Three times a day (TID) | INTRAVENOUS | Status: AC

## 2015-11-09 MED ORDER — OXYCODONE-ACETAMINOPHEN 5-325 MG PO TABS: 1.0000 | ORAL_TABLET | ORAL | Status: AC | PRN

## 2015-11-09 NOTE — Discharge Summary (Signed)
[address] [name], 82 y.o., DOB [birth year], MRN [id number]. 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 He was advised by podiatry to be admitted to the hospital for IV antibiotics. He was taken to the or and had a first ray amputation of left foot with antibiotic bead placement as well as wound VAC application. Patient's cultures showed MRSA, enterococcus and Proteus. ID saw the patient and recommended IV antibiotics for two weeks. Patient also has a wound VAC in place that will need to be changed every 3 days per podiatry. He will follow up outpatient with podiatry for further management. Patient did have constipation during hospitalization that has resolved.            Consults  vascular surgery and podiatry  Significant Tests:  See full reports for all details    Dg Chest 1 View  11/08/2015  CLINICAL DATA:  Status post PICC line placement EXAM: CHEST 1 VIEW COMPARISON:  Not 07/14/2015 FINDINGS: Cardiac shadow is within normal limits. Elevation of the right hemidiaphragm is again seen. A new right PICC line is noted with the tip projecting at the cavoatrial junction. No other focal abnormality is seen. No bony abnormality is noted.  IMPRESSION: PICC line at the SVC/ RA junction. Electronically Signed   By: Alcide CleverMark  Lukens M.D.   On: 11/08/2015 21:53   Dg Foot Complete Left  11/04/2015  CLINICAL DATA:  Medial forefoot and medial heel ulcers. EXAM: LEFT FOOT - COMPLETE 3+ VIEW COMPARISON:  07/22/2015 FINDINGS: Marked hallux valgus deformity identified. Status post osteotomy of the distal aspect of the fifth metatarsal. Soft tissue ulceration overlying in the first MTP joint is identified. No underlying bone erosion identified. Hammertoe deformities are present. Diffuse soft tissue swelling. IMPRESSION: 1. Soft tissue ulceration overlying the first MTP joint. No underlying bone erosion noted. 2. Chronic changes as above Electronically Signed   By: Signa Kellaylor  Stroud M.D.   On: 11/04/2015 12:11       Today   Subjective:   [name] pateint feels well no complaints  Objective:   Blood pressure 121/61, pulse 61, temperature 98.1 F (36.7 C), temperature source Oral, resp. rate 20, height 6' (1.829 m), weight 84.913 kg (187 lb 3.2 oz), SpO2 96 %.  .  Intake/Output Summary (Last 24 hours) at 11/09/15 1218 Last data filed at 11/09/15 0830  Gross per 24 hour  Intake    240 ml  Output    650 ml  Net   -410 ml    Exam VITAL SIGNS: Blood pressure 121/61, pulse 61, temperature 98.1 F (36.7 C), temperature source Oral, resp. rate 20, height 6' (1.829 m), weight 84.913 kg (187 lb 3.2 oz), SpO2 96 %.  GENERAL:  82 y.o.-year-old patient lying in the bed with no acute distress.  EYES: Pupils equal, round,  reactive to light and accommodation. No scleral icterus. Extraocular muscles intact.  HEENT: Head atraumatic, normocephalic. Oropharynx and nasopharynx clear.  NECK:  Supple, no jugular venous distention. No thyroid enlargement, no tenderness.  LUNGS: Normal breath sounds bilaterally, no wheezing, rales,rhonchi or crepitation. No use of accessory muscles of respiration.  CARDIOVASCULAR: S1, S2 normal. No murmurs, rubs, or gallops.   ABDOMEN: Soft, nontender, nondistended. Bowel sounds present. No organomegaly or mass.  EXTREMITIES: No pedal edema, cyanosis, or clubbing. Left leg wound vac in place NEUROLOGIC: Cranial nerves II through XII are intact. Muscle strength 5/5 in all extremities. Sensation intact. Gait not checked.  PSYCHIATRIC: The patient is alert and oriented x 3.  SKIN: No obvious rash, lesion, or ulcer.   Data Review     CBC w Diff: Lab Results  Component Value Date   WBC 6.3 11/09/2015   WBC 10.0 09/27/2015   HGB 10.8* 11/09/2015   HGB 13.5 07/18/2013   HCT 32.0* 11/09/2015   HCT 41.8 09/27/2015   PLT 199 11/09/2015   PLT 187 09/27/2015   PLT 148* 07/18/2013   LYMPHOPCT 9% 11/04/2015   MONOPCT 7% 11/04/2015   EOSPCT 1% 11/04/2015   BASOPCT 0% 11/04/2015   CMP: Lab Results  Component Value Date   NA 138 11/08/2015   NA 142 09/27/2015   NA 139 10/30/2013   Chris 4.0 11/08/2015   Chris 4.2 10/30/2013   CL 106 11/08/2015   CL 104 10/30/2013   CO2 26 11/08/2015   CO2 29 10/30/2013   BUN 14 11/08/2015   BUN 24 09/27/2015   BUN 16 10/30/2013   CREATININE 1.12 11/08/2015   CREATININE 1.15 10/30/2013   CREATININE 1.34 10/21/2012   PROT 6.3 09/27/2015   PROT 5.6* 10/21/2012   ALBUMIN 3.8 09/27/2015   ALBUMIN 3.6 10/21/2012   BILITOT 0.6 09/27/2015   BILITOT 0.6 10/21/2012   ALKPHOS 148* 09/27/2015   AST 13 09/27/2015   ALT 8 09/27/2015  .  Micro Results Recent Results (from the past 240 hour(s))  WOUND CULTURE ([facility] ONLY)     Status: None   Collection Time: 11/01/15  1:20 PM  Result Value Ref Range Status   Specimen Description HEEL  Final   Special Requests LEFT  Final   Gram Stain RARE WBC SEEN NO ORGANISMS SEEN   Final   Culture   Final    LIGHT GROWTH PROTEUS MIRABILIS LIGHT GROWTH METHICILLIN RESISTANT STAPHYLOCOCCUS AUREUS MODERATE GROWTH ENTEROCOCCUS FAECALIS    Report Status 11/09/2015 FINAL  Final   Organism ID, Bacteria PROTEUS MIRABILIS  Final   Organism ID,  Bacteria METHICILLIN RESISTANT STAPHYLOCOCCUS AUREUS  Final   Organism ID, Bacteria ENTEROCOCCUS FAECALIS  Final      Susceptibility   Proteus mirabilis - MIC*    AMPICILLIN <=2 SENSITIVE Sensitive     CEFAZOLIN <=4 SENSITIVE Sensitive     CEFEPIME <=1 SENSITIVE Sensitive     CEFTAZIDIME <=1 SENSITIVE Sensitive     CEFTRIAXONE <=1 SENSITIVE Sensitive     CIPROFLOXACIN <=0.25 SENSITIVE Sensitive     GENTAMICIN <=1 SENSITIVE Sensitive     IMIPENEM 2 SENSITIVE Sensitive     TRIMETH/SULFA <=20 SENSITIVE Sensitive     AMPICILLIN/SULBACTAM <=2 SENSITIVE Sensitive     PIP/TAZO <=4 SENSITIVE Sensitive     * LIGHT GROWTH PROTEUS MIRABILIS   Enterococcus faecalis - MIC*    AMPICILLIN SENSITIVE Sensitive     LINEZOLID SENSITIVE Sensitive     * MODERATE GROWTH ENTEROCOCCUS FAECALIS  Methicillin resistant staphylococcus aureus - MIC*    CIPROFLOXACIN >=8 RESISTANT Resistant     ERYTHROMYCIN >=8 RESISTANT Resistant     GENTAMICIN <=0.5 SENSITIVE Sensitive     OXACILLIN >=4 RESISTANT Resistant     TETRACYCLINE 2 SENSITIVE Sensitive     VANCOMYCIN 1 SENSITIVE Sensitive     TRIMETH/SULFA <=10 SENSITIVE Sensitive     CLINDAMYCIN >=8 RESISTANT Resistant     RIFAMPIN <=0.5 SENSITIVE Sensitive     Inducible Clindamycin NEGATIVE Sensitive     * LIGHT GROWTH METHICILLIN RESISTANT STAPHYLOCOCCUS AUREUS  Surgical pcr screen     Status: Abnormal   Collection Time: 11/04/15  6:11 PM  Result Value Ref Range Status   MRSA, PCR POSITIVE (A) NEGATIVE Final    Comment: CRITICAL RESULT CALLED TO, READ BACK BY AND VERIFIED WITH: MARY RAYMOND AT 2324 11/04/15.PMH    Staphylococcus aureus POSITIVE (A) NEGATIVE Final    Comment:        The Xpert SA Assay (FDA approved for NASAL specimens in patients over 14 years of age), is one component of a comprehensive surveillance program.  Test performance has been validated by [facility] for patients greater than or equal to 36 year old. It is not intended to  diagnose infection nor to guide or monitor treatment.   Anaerobic culture     Status: None (Preliminary result)   Collection Time: 11/05/15 12:17 PM  Result Value Ref Range Status   Specimen Description WOUND  Final   Special Requests NONE  Final   Culture NO ANAEROBES ISOLATED  Final   Report Status PENDING  Incomplete  WOUND CULTURE ([facility] ONLY)     Status: None   Collection Time: 11/05/15 12:17 PM  Result Value Ref Range Status   Specimen Description WOUND  Final   Special Requests NONE  Final   Gram Stain   Final    FEW RED BLOOD CELLS RARE WBC SEEN NO ORGANISMS SEEN    Culture   Final    LIGHT GROWTH PROTEUS MIRABILIS LIGHT GROWTH STAPHYLOCOCCUS AUREUS    Report Status 11/08/2015 FINAL  Final   Organism ID, Bacteria PROTEUS MIRABILIS  Final   Organism ID, Bacteria PROTEUS MIRABILIS  Final   Organism ID, Bacteria STAPHYLOCOCCUS AUREUS  Final   Organism ID, Bacteria STAPHYLOCOCCUS AUREUS  Final      Susceptibility   Staphylococcus aureus - MIC*    CEFAZOLIN RESISTANT Resistant     LEVOFLOXACIN RESISTANT Resistant     OXACILLIN RESISTANT Resistant     TRIMETH/SULFA SENSITIVE Sensitive     VANCOMYCIN SENSITIVE Sensitive     TETRACYCLINE SENSITIVE Sensitive    Staphylococcus aureus - MIC*    CIPROFLOXACIN >=8 RESISTANT Resistant     ERYTHROMYCIN >=8 RESISTANT Resistant     GENTAMICIN <=0.5 SENSITIVE Sensitive     OXACILLIN >=4 RESISTANT Resistant     TETRACYCLINE <=1 SENSITIVE Sensitive     VANCOMYCIN 1 SENSITIVE Sensitive     TRIMETH/SULFA <=10 SENSITIVE Sensitive     CLINDAMYCIN >=8 RESISTANT Resistant     RIFAMPIN <=0.5 SENSITIVE Sensitive     Inducible Clindamycin NEGATIVE Sensitive     * LIGHT GROWTH STAPHYLOCOCCUS AUREUS    LIGHT GROWTH STAPHYLOCOCCUS AUREUS   Proteus mirabilis - MIC*    AMPICILLIN RESISTANT Resistant     AMPICILLIN/SULBACTAM RESISTANT Resistant     CEFAZOLIN RESISTANT Resistant     CIPROFLOXACIN SENSITIVE Sensitive     GENTAMICIN SENSITIVE  Sensitive     IMIPENEM  SENSITIVE Sensitive     TRIMETH/SULFA SENSITIVE Sensitive    Proteus mirabilis - MIC*    AMPICILLIN >=32 RESISTANT Resistant     CEFAZOLIN >=64 RESISTANT Resistant     CEFEPIME <=1 RESISTANT Resistant     CEFTAZIDIME 16 RESISTANT Resistant     CEFTRIAXONE 8 RESISTANT Resistant     CIPROFLOXACIN <=0.25 SENSITIVE Sensitive     GENTAMICIN <=1 SENSITIVE Sensitive     IMIPENEM 1 SENSITIVE Sensitive     TRIMETH/SULFA <=20 SENSITIVE Sensitive     AMPICILLIN/SULBACTAM >=32 RESISTANT Resistant     PIP/TAZO <=4 SENSITIVE Sensitive     * LIGHT GROWTH PROTEUS MIRABILIS    LIGHT GROWTH PROTEUS MIRABILIS        Code Status Orders        Start     Ordered   11/04/15 1400  Full code   Continuous     11/04/15 1400    Code Status History    Date Active Date Inactive Code Status Order ID Comments User Context   11/04/2015 12:44 PM 11/04/2015  2:00 PM DNR [id number]  Altamese Dilling, MD ED    Advance Directive Documentation        Most Recent Value   Type of Advance Directive  Healthcare Power of Attorney   Pre-existing out of facility DNR order (yellow form or pink MOST form)     "MOST" Form in Place?            Follow-up Information    Follow up with TROXLER,MATTHEW G, DPM In 7 days.   Specialty:  Podiatry   Contact information:   [phone] [facility] [address] [facility] [place] [address] [phone]       Follow up with Vonita Moss, MD In 7 days.   Specialty:  Family Medicine   Contact information:   [address] [place] [address] [phone]       Discharge Medications     Medication List    TAKE these medications        amiodarone 200 MG tablet  Commonly known as:  PACERONE  TAKE ONE-HALF TABLET TWICE A DAY     aspirin EC 81 MG tablet  Take 1 tablet (81 mg total) by mouth daily.     carbidopa-levodopa 25-100 MG tablet  Commonly known as:  SINEMET IR  Take 1 tablet by mouth 4 (four) times daily.      clopidogrel 75 MG tablet  Commonly known as:  PLAVIX  TAKE 1 TABLET EVERY DAY     cyanocobalamin 1000 MCG/ML injection  Commonly known as:  (VITAMIN B-12)  Inject 1 mL (1,000 mcg total) into the muscle every 30 (thirty) days. 1 injection in 2 weeks then every 30 days     feeding supplement (GLUCERNA SHAKE) Liqd  Take 237 mLs by mouth 3 (three) times daily between meals.     levothyroxine 150 MCG tablet  Commonly known as:  SYNTHROID, LEVOTHROID  Take 150 mcg by mouth daily before breakfast.     oxyCODONE-acetaminophen 5-325 MG tablet  Commonly known as:  PERCOCET/ROXICET  Take 1 tablet by mouth every 4 (four) hours as needed for moderate pain.     piperacillin-tazobactam 3.375 GM/50ML IVPB  Commonly known as:  ZOSYN  Inject 50 mLs (3.375 g total) into the vein every 8 (eight) hours.     potassium chloride 10 MEQ tablet  Commonly known as:  Chris-DUR,KLOR-CON  Take 10 mEq by mouth daily.  pramipexole 1 MG tablet  Commonly known as:  MIRAPEX  Take 1/2 tablet four times daily.     sitaGLIPtin 100 MG tablet  Commonly known as:  JANUVIA  Take 1 tablet (100 mg total) by mouth daily.     tamsulosin 0.4 MG Caps capsule  Commonly known as:  FLOMAX  Take 1 capsule (0.4 mg total) by mouth daily after supper.     Vancomycin 750-5 MG/150ML-% Soln  Commonly known as:  VANCOCIN  Inject 150 mLs (750 mg total) into the vein every 12 (twelve) hours.      ASK your doctor about these medications        furosemide 40 MG tablet  Commonly known as:  LASIX  Take 1 tablet (40 mg total) by mouth 2 (two) times daily.           Total Time in preparing paper work, data evaluation and todays exam - 35 minutes  Auburn Bilberry M.D on 11/09/2015 at 12:18 PM  [facility]   Office  [phone]

## 2015-11-09 NOTE — Progress Notes (Signed)
Called report to Tamala SerAngela Walker, LPN at [facility] at this time. EMS called for transport. Patient belongings packed and given to family and patient.

## 2015-11-09 NOTE — Discharge Instructions (Signed)
DIET:  Regular diet  DISCHARGE CONDITION:  Stable  ACTIVITY:  Activity as tolerated  OXYGEN:  Home Oxygen: No.   Oxygen Delivery: room air  DISCHARGE LOCATION:  nursing home    ADDITIONAL DISCHARGE INSTRUCTION:wound vac change every 3 days, Change dressing on left foot when wound vac changed   Discharge antibiotics Vancomycin 750 mg every 12 hours .  Goal vancomycin trough 15-20.  Pharmacy to adjust dosing based on levels Zosyn 3.375 q 8 hours   PICC Care per protocol Labs weekly while on IV antibiotics  CBC w diff  Comprehensive met panel Vancomycin Trough  CRP  Planned duration of antibiotics 14 days  Stop dateJune 17Follow up clinic dateTBD FAX weekly labs to [phone]    If you experience worsening of your admission symptoms, develop shortness of breath, life threatening emergency, suicidal or homicidal thoughts you must seek medical attention immediately by calling 911 or calling your MD immediately  if symptoms less severe.  You Must read complete instructions/literature along with all the possible adverse reactions/side effects for all the Medicines you take and that have been prescribed to you. Take any new Medicines after you have completely understood and accpet all the possible adverse reactions/side effects.   Please note  You were cared for by a hospitalist during your hospital stay. If you have any questions about your discharge medications or the care you received while you were in the hospital after you are discharged, you can call the unit and asked to speak with the hospitalist on call if the hospitalist that took care of you is not available. Once you are discharged, your primary care physician will handle any further medical issues. Please note that NO REFILLS for any discharge medications will be authorized once you are discharged, as it  is imperative that you return to your primary care physician (or establish a relationship with a primary care physician if you do not have one) for your aftercare needs so that they can reassess your need for medications and monitor your lab values.

## 2015-11-09 NOTE — Clinical Social Work Note (Signed)
Pt is ready for discharge today. Pt and daughter chose [facility]. Pt's daughter will cover the cost of IV ABX. Auth has been obtained for [employer]. Pt and pt's daughter are aware and agreeable to discharge plan. RN called report and EMS will provide transportation. CSW is signing off as no further need identified.   Dede QuerySarah Juanmiguel Defelice, MSW, LCSW  Clinical Social Worker  [phone]

## 2015-11-09 NOTE — Clinical Social Work Placement (Signed)
   CLINICAL SOCIAL WORK PLACEMENT  NOTE  Date:  11/09/2015  Patient Details  Name: [name] MRN: [id number] Date of Birth: 05-27-1925  Clinical Social Work is seeking post-discharge placement for this patient at the Skilled  Nursing Facility level of care (*CSW will initial, date and re-position this form in  chart as items are completed):  Yes   Patient/family provided with [facility] Clinical Social Work Department's list of facilities offering this level of care within the geographic area requested by the patient (or if unable, by the patient's family).  Yes   Patient/family informed of their freedom to choose among providers that offer the needed level of care, that participate in Medicare, Medicaid or managed care program needed by the patient, have an available bed and are willing to accept the patient.      Patient/family informed of [facility]'s ownership interest in [facility] and [facility], as well as of the fact that they are under no obligation to receive care at these facilities.  PASRR submitted to EDS on       PASRR number received on       Existing PASRR number confirmed on 11/08/15     FL2 transmitted to all facilities in geographic area requested by pt/family on 11/08/15     FL2 transmitted to all facilities within larger geographic area on       Patient informed that his/her managed care company has contracts with or will negotiate with certain facilities, including the following:        Yes   Patient/family informed of bed offers received.  Patient chooses bed at [facility]     Physician recommends and patient chooses bed at  [facility](SNF)    Patient to be transferred to [facility]  on 11/09/15.  Patient to be transferred to facility by [facility]     Patient family notified on 11/09/15 of transfer.  Name of family member notified:  Pt's daughter     PHYSICIAN       Additional Comment:     _______________________________________________ Dede QuerySarah Laini Urick, LCSW 11/09/2015, 3:49 PM

## 2015-11-09 NOTE — Progress Notes (Signed)
Physical Therapy Treatment Patient Details Name: [name] K Suares MRN: [id number] DOB: 1925/03/19 Today's Date: 11/09/2015    History of Present Illness [name] is a 82 y.o. male with a known history of coronary artery disease, diabetes, aortic stenosis, hypertension, hyperlipidemia, thyroid disease, hypothyroidism- has chronic circulatory issue in both lower extremities and he had undergone surgery by vascular Dr.Schiener- successful restoration of the circulation on the right lower extremity but on the left after 3-4 surgeries there was not successful circulation up to her foot. He follows with the phone the clinic and podiatry clinic for his chronic wound on the left foot. Was recently given a course of antibiotic, which he finished a few weeks ago. For last few days his wound on the right foot started turning red again and had some discharge, so he went to podiatry clinic yesterday and after looking at his wound Dr. Graciela HusbandsKlein advised him to go to emergency room. Pt underwent L first ray amputation and is POD#2 at time of PT evaluation    PT Comments    Initial attempt, pt with staff. Second attempt, pt reluctant, but agreeable with gentle encouragement. Pt possess improved attitude today and participates well with supine exercises bilaterally; limited on left by contracture. Right knee noted to be contracted to a lesser degree; pt positioned with pillow under right knee as well. Encouraged folded pillow under ankle versus knee to promote right knee extension and protect right heel. Pt requires use of bed pan post bed exercises, and otherwise wished to remain in bed at this time. Continue PT to progress strength and up/out of bed transfers to improve functional mobility.   Follow Up Recommendations  SNF     Equipment Recommendations       Recommendations for Other Services       Precautions / Restrictions Precautions Precautions: Fall Restrictions Weight Bearing Restrictions: Yes LLE  Weight Bearing: Non weight bearing    Mobility  Bed Mobility                  Transfers                    Ambulation/Gait                 Stairs            Wheelchair Mobility    Modified Rankin (Stroke Patients Only)       Balance                                    Cognition Arousal/Alertness: Awake/alert Behavior During Therapy: WFL for tasks assessed/performed Overall Cognitive Status: Within Functional Limits for tasks assessed                      Exercises General Exercises - Lower Extremity Ankle Circles/Pumps: AROM;Both;15 reps;Supine Quad Sets: Strengthening;Right;10 reps;Supine Gluteal Sets: Strengthening;Both;10 reps;Supine Short Arc Quad: AAROM;Right;10 reps;Supine Heel Slides: AAROM;Left;10 reps;Supine (A/RROM on R ) Hip ABduction/ADduction: AAROM;Left;10 reps;Supine (RROM on R) Straight Leg Raises: AROM;Right;10 reps;Supine (AAROM on left)    General Comments        Pertinent Vitals/Pain Pain Assessment:  (In knees, especially left with exercises)    Home Living                      Prior Function  PT Goals (current goals can now be found in the care plan section) Progress towards PT goals: Progressing toward goals (slowly)    Frequency  7X/week    PT Plan Current plan remains appropriate    Co-evaluation             End of Session   Activity Tolerance: Patient tolerated treatment well (pain in Left knee with exercise) Patient left: in bed;with call bell/phone within reach;with nursing/sitter in room;with family/visitor present;Other (comment) (pt requires bed pan use)     Time: 0953-1010 PT Time Calculation (min) (ACUTE ONLY): 17 min  Charges:  $Therapeutic Exercise: 8-22 mins                    G Codes:      Kristeen Miss, PTA 11/09/2015, 12:23 PM

## 2015-11-11 LAB — ANAEROBIC CULTURE

## 2015-11-21 ENCOUNTER — Encounter: Payer: Self-pay | Admitting: Emergency Medicine

## 2015-11-21 ENCOUNTER — Inpatient Hospital Stay: Payer: PPO | Admitting: Family Medicine

## 2015-11-21 ENCOUNTER — Inpatient Hospital Stay
Admission: EM | Admit: 2015-11-21 | Discharge: 2015-11-28 | DRG: 574 | Disposition: A | Discharge status: Skilled Nursing Facility | Payer: PPO | Attending: Internal Medicine | Admitting: Internal Medicine

## 2015-11-21 DIAGNOSIS — I350 Nonrheumatic aortic (valve) stenosis: Secondary | ICD-10-CM | POA: Diagnosis present

## 2015-11-21 DIAGNOSIS — E1152 Type 2 diabetes mellitus with diabetic peripheral angiopathy with gangrene: Principal | ICD-10-CM | POA: Diagnosis present

## 2015-11-21 DIAGNOSIS — L97419 Non-pressure chronic ulcer of right heel and midfoot with unspecified severity: Secondary | ICD-10-CM | POA: Diagnosis present

## 2015-11-21 DIAGNOSIS — L97409 Non-pressure chronic ulcer of unspecified heel and midfoot with unspecified severity: Secondary | ICD-10-CM | POA: Diagnosis present

## 2015-11-21 DIAGNOSIS — B351 Tinea unguium: Secondary | ICD-10-CM | POA: Diagnosis present

## 2015-11-21 DIAGNOSIS — E11628 Type 2 diabetes mellitus with other skin complications: Secondary | ICD-10-CM | POA: Diagnosis present

## 2015-11-21 DIAGNOSIS — I2510 Atherosclerotic heart disease of native coronary artery without angina pectoris: Secondary | ICD-10-CM | POA: Diagnosis present

## 2015-11-21 DIAGNOSIS — Z66 Do not resuscitate: Secondary | ICD-10-CM | POA: Diagnosis present

## 2015-11-21 DIAGNOSIS — E039 Hypothyroidism, unspecified: Secondary | ICD-10-CM | POA: Diagnosis present

## 2015-11-21 DIAGNOSIS — G20 Parkinson's disease: Secondary | ICD-10-CM | POA: Diagnosis present

## 2015-11-21 DIAGNOSIS — G2581 Restless legs syndrome: Secondary | ICD-10-CM | POA: Diagnosis present

## 2015-11-21 DIAGNOSIS — E785 Hyperlipidemia, unspecified: Secondary | ICD-10-CM | POA: Diagnosis present

## 2015-11-21 DIAGNOSIS — I10 Essential (primary) hypertension: Secondary | ICD-10-CM | POA: Diagnosis present

## 2015-11-21 DIAGNOSIS — L97429 Non-pressure chronic ulcer of left heel and midfoot with unspecified severity: Secondary | ICD-10-CM | POA: Diagnosis present

## 2015-11-21 DIAGNOSIS — L089 Local infection of the skin and subcutaneous tissue, unspecified: Secondary | ICD-10-CM | POA: Diagnosis present

## 2015-11-21 DIAGNOSIS — I4891 Unspecified atrial fibrillation: Secondary | ICD-10-CM | POA: Diagnosis present

## 2015-11-21 DIAGNOSIS — Z8614 Personal history of Methicillin resistant Staphylococcus aureus infection: Secondary | ICD-10-CM

## 2015-11-21 DIAGNOSIS — Z79899 Other long term (current) drug therapy: Secondary | ICD-10-CM | POA: Diagnosis not present

## 2015-11-21 DIAGNOSIS — Z7982 Long term (current) use of aspirin: Secondary | ICD-10-CM | POA: Diagnosis not present

## 2015-11-21 DIAGNOSIS — N400 Enlarged prostate without lower urinary tract symptoms: Secondary | ICD-10-CM | POA: Diagnosis present

## 2015-11-21 DIAGNOSIS — L97401 Non-pressure chronic ulcer of unspecified heel and midfoot limited to breakdown of skin: Secondary | ICD-10-CM

## 2015-11-21 DIAGNOSIS — E11621 Type 2 diabetes mellitus with foot ulcer: Secondary | ICD-10-CM | POA: Diagnosis present

## 2015-11-21 DIAGNOSIS — Z89422 Acquired absence of other left toe(s): Secondary | ICD-10-CM | POA: Diagnosis not present

## 2015-11-21 DIAGNOSIS — L89629 Pressure ulcer of left heel, unspecified stage: Secondary | ICD-10-CM | POA: Diagnosis present

## 2015-11-21 DIAGNOSIS — E876 Hypokalemia: Secondary | ICD-10-CM | POA: Diagnosis present

## 2015-11-21 DIAGNOSIS — I70235 Atherosclerosis of native arteries of right leg with ulceration of other part of foot: Secondary | ICD-10-CM

## 2015-11-21 DIAGNOSIS — L89302 Pressure ulcer of unspecified buttock, stage 2: Secondary | ICD-10-CM | POA: Diagnosis present

## 2015-11-21 DIAGNOSIS — L97529 Non-pressure chronic ulcer of other part of left foot with unspecified severity: Secondary | ICD-10-CM

## 2015-11-21 DIAGNOSIS — M109 Gout, unspecified: Secondary | ICD-10-CM | POA: Diagnosis not present

## 2015-11-21 DIAGNOSIS — Z8673 Personal history of transient ischemic attack (TIA), and cerebral infarction without residual deficits: Secondary | ICD-10-CM

## 2015-11-21 DIAGNOSIS — Z955 Presence of coronary angioplasty implant and graft: Secondary | ICD-10-CM | POA: Diagnosis not present

## 2015-11-21 DIAGNOSIS — Z7902 Long term (current) use of antithrombotics/antiplatelets: Secondary | ICD-10-CM

## 2015-11-21 LAB — CBC WITH DIFFERENTIAL/PLATELET
BASOS PCT: 1 %
Basophils Absolute: 0.1 10*3/uL (ref 0–0.1)
EOS PCT: 2 %
Eosinophils Absolute: 0.2 10*3/uL (ref 0–0.7)
HEMATOCRIT: 35.6 % — AB (ref 40.0–52.0)
Hemoglobin: 11.8 g/dL — ABNORMAL LOW (ref 13.0–18.0)
LYMPHS PCT: 12 %
Lymphs Abs: 1.1 10*3/uL (ref 1.0–3.6)
MCH: 29.9 pg (ref 26.0–34.0)
MCHC: 33.1 g/dL (ref 32.0–36.0)
MCV: 90.2 fL (ref 80.0–100.0)
MONO ABS: 0.8 10*3/uL (ref 0.2–1.0)
MONOS PCT: 9 %
NEUTROS ABS: 7.5 10*3/uL — AB (ref 1.4–6.5)
Neutrophils Relative %: 76 %
Platelets: 420 10*3/uL (ref 150–440)
RBC: 3.95 MIL/uL — ABNORMAL LOW (ref 4.40–5.90)
RDW: 15.7 % — AB (ref 11.5–14.5)
WBC: 9.7 10*3/uL (ref 3.8–10.6)

## 2015-11-21 LAB — VANCOMYCIN, RANDOM: VANCOMYCIN RM: 30 ug/mL

## 2015-11-21 LAB — BASIC METABOLIC PANEL
Anion gap: 9 (ref 5–15)
BUN: 21 mg/dL — ABNORMAL HIGH (ref 6–20)
CALCIUM: 8.9 mg/dL (ref 8.9–10.3)
CO2: 27 mmol/L (ref 22–32)
CREATININE: 1.12 mg/dL (ref 0.61–1.24)
Chloride: 102 mmol/L (ref 101–111)
GFR calc Af Amer: >60 mL/min (ref >60)
GFR calc non Af Amer: 57 mL/min — ABNORMAL LOW (ref >60)
GLUCOSE: 242 mg/dL — AB (ref 65–99)
Potassium: 3.2 mmol/L — ABNORMAL LOW (ref 3.5–5.1)
Sodium: 138 mmol/L (ref 135–145)

## 2015-11-21 LAB — GLUCOSE, CAPILLARY
GLUCOSE-CAPILLARY: 250 mg/dL — AB (ref 65–99)
Glucose-Capillary: 245 mg/dL — ABNORMAL HIGH (ref 65–99)

## 2015-11-21 MED ORDER — PIPERACILLIN-TAZOBACTAM 3.375 G IVPB 30 MIN
3.3750 g | Freq: Once | INTRAVENOUS | Status: AC
  Administered 2015-11-21: 3.375 g via INTRAVENOUS
  Filled 2015-11-21: qty 50

## 2015-11-21 MED ORDER — ASPIRIN EC 81 MG PO TBEC
81.0000 mg | DELAYED_RELEASE_TABLET | Freq: Every day | ORAL | Status: DC
  Administered 2015-11-22 – 2015-11-28 (×5): 81 mg via ORAL
  Filled 2015-11-21 (×6): qty 1

## 2015-11-21 MED ORDER — VANCOMYCIN HCL 1000 MG IV SOLR: 1000.0000 mg | Freq: Two times a day (BID) | INTRAVENOUS | Status: DC

## 2015-11-21 MED ORDER — ONDANSETRON HCL 4 MG/2ML IJ SOLN
4.0000 mg | Freq: Four times a day (QID) | INTRAMUSCULAR | Status: DC | PRN
  Administered 2015-11-23: 4 mg via INTRAVENOUS
  Filled 2015-11-21: qty 2

## 2015-11-21 MED ORDER — ONDANSETRON HCL 4 MG PO TABS: 4.0000 mg | ORAL_TABLET | Freq: Four times a day (QID) | ORAL | Status: DC | PRN

## 2015-11-21 MED ORDER — PRAMIPEXOLE DIHYDROCHLORIDE 0.25 MG PO TABS
0.5000 mg | ORAL_TABLET | Freq: Three times a day (TID) | ORAL | Status: DC
  Administered 2015-11-21 – 2015-11-28 (×17): 0.5 mg via ORAL
  Filled 2015-11-21 (×18): qty 2

## 2015-11-21 MED ORDER — CARBIDOPA-LEVODOPA 25-100 MG PO TABS
1.0000 | ORAL_TABLET | Freq: Four times a day (QID) | ORAL | Status: DC
  Administered 2015-11-21 – 2015-11-28 (×20): 1 via ORAL
  Filled 2015-11-21 (×21): qty 1

## 2015-11-21 MED ORDER — LEVOTHYROXINE SODIUM 150 MCG PO TABS
150.0000 ug | ORAL_TABLET | Freq: Every day | ORAL | Status: DC
  Administered 2015-11-22 – 2015-11-28 (×5): 150 ug via ORAL
  Filled 2015-11-21 (×6): qty 1

## 2015-11-21 MED ORDER — ENOXAPARIN SODIUM 40 MG/0.4ML ~~LOC~~ SOLN
40.0000 mg | SUBCUTANEOUS | Status: DC
  Administered 2015-11-21 – 2015-11-27 (×5): 40 mg via SUBCUTANEOUS
  Filled 2015-11-21 (×5): qty 0.4

## 2015-11-21 MED ORDER — OXYCODONE-ACETAMINOPHEN 5-325 MG PO TABS
1.0000 | ORAL_TABLET | ORAL | Status: DC | PRN
  Administered 2015-11-23 – 2015-11-27 (×2): 1 via ORAL
  Filled 2015-11-21 (×2): qty 1

## 2015-11-21 MED ORDER — COLCHICINE 0.6 MG PO TABS
0.6000 mg | ORAL_TABLET | Freq: Every day | ORAL | Status: DC
  Administered 2015-11-21 – 2015-11-28 (×6): 0.6 mg via ORAL
  Filled 2015-11-21 (×7): qty 1

## 2015-11-21 MED ORDER — GLUCERNA SHAKE PO LIQD
237.0000 mL | Freq: Three times a day (TID) | ORAL | Status: DC
  Administered 2015-11-21 – 2015-11-27 (×12): 237 mL via ORAL

## 2015-11-21 MED ORDER — ACETAMINOPHEN 650 MG RE SUPP: 650.0000 mg | Freq: Four times a day (QID) | RECTAL | Status: DC | PRN

## 2015-11-21 MED ORDER — CYANOCOBALAMIN 1000 MCG/ML IJ SOLN: 1000.0000 ug | INTRAMUSCULAR | Status: DC

## 2015-11-21 MED ORDER — AMIODARONE HCL 200 MG PO TABS
100.0000 mg | ORAL_TABLET | Freq: Two times a day (BID) | ORAL | Status: DC
  Administered 2015-11-21 – 2015-11-28 (×12): 100 mg via ORAL
  Filled 2015-11-21 (×13): qty 1

## 2015-11-21 MED ORDER — POTASSIUM CHLORIDE CRYS ER 10 MEQ PO TBCR: 10.0000 meq | EXTENDED_RELEASE_TABLET | Freq: Every day | ORAL | Status: DC

## 2015-11-21 MED ORDER — FUROSEMIDE 40 MG PO TABS
40.0000 mg | ORAL_TABLET | Freq: Two times a day (BID) | ORAL | Status: DC
  Administered 2015-11-22: 40 mg via ORAL
  Filled 2015-11-21: qty 1

## 2015-11-21 MED ORDER — ACETAMINOPHEN 325 MG PO TABS
650.0000 mg | ORAL_TABLET | Freq: Four times a day (QID) | ORAL | Status: DC | PRN
  Administered 2015-11-22 – 2015-11-23 (×2): 650 mg via ORAL
  Filled 2015-11-21 (×2): qty 2

## 2015-11-21 MED ORDER — VANCOMYCIN HCL IN DEXTROSE 1-5 GM/200ML-% IV SOLN
1000.0000 mg | Freq: Two times a day (BID) | INTRAVENOUS | Status: DC
  Administered 2015-11-22 (×2): 1000 mg via INTRAVENOUS
  Filled 2015-11-21 (×3): qty 200

## 2015-11-21 MED ORDER — POTASSIUM CHLORIDE CRYS ER 20 MEQ PO TBCR
20.0000 meq | EXTENDED_RELEASE_TABLET | Freq: Two times a day (BID) | ORAL | Status: DC
  Administered 2015-11-21 – 2015-11-28 (×12): 20 meq via ORAL
  Filled 2015-11-21 (×14): qty 1

## 2015-11-21 MED ORDER — PIPERACILLIN-TAZOBACTAM 3.375 G IVPB
3.3750 g | Freq: Three times a day (TID) | INTRAVENOUS | Status: DC
  Filled 2015-11-21 (×3): qty 50

## 2015-11-21 MED ORDER — TAMSULOSIN HCL 0.4 MG PO CAPS
0.4000 mg | ORAL_CAPSULE | Freq: Every day | ORAL | Status: DC
  Administered 2015-11-22 – 2015-11-27 (×4): 0.4 mg via ORAL
  Filled 2015-11-21 (×4): qty 1

## 2015-11-21 MED ORDER — VANCOMYCIN HCL IN DEXTROSE 1-5 GM/200ML-% IV SOLN
1000.0000 mg | Freq: Once | INTRAVENOUS | Status: AC
  Administered 2015-11-21: 1000 mg via INTRAVENOUS
  Filled 2015-11-21: qty 200

## 2015-11-21 MED ORDER — PIPERACILLIN-TAZOBACTAM 4.5 G IVPB
4.5000 g | Freq: Three times a day (TID) | INTRAVENOUS | Status: DC
  Administered 2015-11-21 – 2015-11-22 (×2): 4.5 g via INTRAVENOUS
  Filled 2015-11-21 (×4): qty 100

## 2015-11-21 NOTE — Progress Notes (Signed)
Called [facility] spoke with Tamala SerAngela Walker, LPN to confirm what medications patient took today prior to coming to hospital.  Complete medication list in chart.

## 2015-11-21 NOTE — Consult Note (Signed)
[facility] Vascular Consult Note  MRN : [id number]  [name] is a 82 y.o. (May 22, 1925) male who presents with chief complaint of  Chief Complaint  Patient presents with  . Foot Injury   History of Present Illness:  Patient well know to our service. Treated for PAD with ulceration. Last intervention approximately 3 months ago. Recent amputation of left great big toe with VAC placement by podiatry.   Examined / interviewed patient with children at bedside. Patient endorses history of being discharged to nursing s/p amputation. During this time at nursing home, his daughter had noticed worsening left heel ulceration and discharge from amputation site. Patient and family claim patient not being "turned" to prevent pressure ulcers at facility. Daughter had sent pictures to podiatry whom recommended admission to [facility] for evaluation and possible intervention. Patient denies any fever, nausea or vomiting. Does complain of increased left heel and sacral pain.  Patient admitted with bilateral heel ulcerations, left worse than right and sacral decubitus.  Vascular surgery consulted by primary team, Dr. Cherlynn Kaiser for possible endovascular intervention.  Current Facility-Administered Medications  Medication Dose Route Frequency Provider Last Rate Last Dose  . acetaminophen (TYLENOL) tablet 650 mg  650 mg Oral Q6H PRN Houston Siren, MD       Or  . acetaminophen (TYLENOL) suppository 650 mg  650 mg Rectal Q6H PRN Houston Siren, MD      . amiodarone (PACERONE) tablet 100 mg  100 mg Oral BID Houston Siren, MD      . aspirin EC tablet 81 mg  81 mg Oral Daily Houston Siren, MD   81 mg at 11/21/15 1723  . carbidopa-levodopa (SINEMET IR) 25-100 MG per tablet immediate release 1 tablet  1 tablet Oral QID Houston Siren, MD   1 tablet at 11/21/15 1736  . colchicine tablet 0.6 mg  0.6 mg Oral Daily Houston Siren, MD   0.6 mg at 11/21/15 1736  . cyanocobalamin ((VITAMIN  B-12)) injection 1,000 mcg  1,000 mcg Intramuscular Q30 days Houston Siren, MD   1,000 mcg at 11/21/15 1723  . enoxaparin (LOVENOX) injection 40 mg  40 mg Subcutaneous Q24H Houston Siren, MD   40 mg at 11/21/15 1737  . feeding supplement (GLUCERNA SHAKE) (GLUCERNA SHAKE) liquid 237 mL  237 mL Oral TID BM Houston Siren, MD      . furosemide (LASIX) tablet 40 mg  40 mg Oral BID Houston Siren, MD   40 mg at 11/21/15 1723  . [START ON 11/22/2015] levothyroxine (SYNTHROID, LEVOTHROID) tablet 150 mcg  150 mcg Oral QAC breakfast Houston Siren, MD      . ondansetron [facility]) tablet 4 mg  4 mg Oral Q6H PRN Houston Siren, MD       Or  . ondansetron (ZOFRAN) injection 4 mg  4 mg Intravenous Q6H PRN Houston Siren, MD      . oxyCODONE-acetaminophen (PERCOCET/ROXICET) 5-325 MG per tablet 1 tablet  1 tablet Oral Q4H PRN Houston Siren, MD      . piperacillin-tazobactam (ZOSYN) IVPB 4.5 g  4.5 g Intravenous Q8H Houston Siren, MD      . potassium chloride SA (K-DUR,KLOR-CON) CR tablet 20 mEq  20 mEq Oral BID Houston Siren, MD      . pramipexole (MIRAPEX) tablet 0.5 mg  0.5 mg Oral TID Houston Siren, MD   0.5 mg at 11/21/15 1736  . tamsulosin (  FLOMAX) capsule 0.4 mg  0.4 mg Oral QPC supper Houston SirenVivek J Sainani, MD   0.4 mg at 11/21/15 1724  . [START ON 11/22/2015] vancomycin (VANCOCIN) IVPB 1000 mg/200 mL premix  1,000 mg Intravenous Q12H Houston SirenVivek J Sainani, MD       Past Medical History  Diagnosis Date  . Coronary artery disease   . Diabetes mellitus without complication (HCC)   . Aortic stenosis   . Hypertension   . Hyperlipidemia   . TIA (transient ischemic attack)   . Thyroid disease   . Hypothyroidism    Past Surgical History  Procedure Laterality Date  . Appendectomy    . Hernia repair    . Knee surgery      bilateral  . Eye surgery    . Colonoscopy    . Cardiac catheterization  2010    showed a patient circumflex stent with noncritical CAD and normal right-sided pressures.  .  Coronary angioplasty  01/24/2007    stent placement to the mid circumflex   . Knee surgery Left   . Peripheral vascular catheterization N/A 08/30/2015    Procedure: Abdominal Aortogram w/Lower Extremity;  Surgeon: Renford DillsGregory G Schnier, MD;  Location: [facility] INVASIVE CV LAB;  Service: Cardiovascular;  Laterality: N/A;  . Peripheral vascular catheterization  08/30/2015    Procedure: Lower Extremity Intervention;  Surgeon: Renford DillsGregory G Schnier, MD;  Location: [facility] INVASIVE CV LAB;  Service: Cardiovascular;;  . Amputation toe Left 11/05/2015    Procedure: AMPUTATION TOE;  Surgeon: Recardo EvangelistMatthew Troxler, DPM;  Location: [facility] ORS;  Service: Podiatry;  Laterality: Left;  . Irrigation and debridement foot Left 11/05/2015    Procedure: IRRIGATION AND DEBRIDEMENT FOOT / HEEL ULCER;  Surgeon: Recardo EvangelistMatthew Troxler, DPM;  Location: [facility] ORS;  Service: Podiatry;  Laterality: Left;  . Application of wound vac Left 11/05/2015    Procedure: APPLICATION OF WOUND VAC;  Surgeon: Recardo EvangelistMatthew Troxler, DPM;  Location: [facility] ORS;  Service: Podiatry;  Laterality: Left;   Social History Social History  Substance Use Topics  . Smoking status: Never Smoker   . Smokeless tobacco: Never Used  . Alcohol Use: No   Family History Family History  Problem Relation Age of Onset  . Heart disease Mother   . Heart attack Father [id number]    MI  . Heart attack Brother [id number]    MI  Patient denies family history of PAD, renal disease or porphyria.   No Known Allergies  REVIEW OF SYSTEMS (Negative unless checked)  Constitutional: [] Weight loss  [] Fever  [] Chills Cardiac: [] Chest pain   [] Chest pressure   [] Palpitations   [] Shortness of breath when laying flat   [] Shortness of breath at rest   [] Shortness of breath with exertion. Vascular:  [] Pain in legs with walking   [] Pain in legs at rest   [] Pain in legs when laying flat   [] Claudication   [x] Pain in feet when walking  [x] Pain in feet at rest  [x] Pain in feet when laying flat   [] History of DVT   [] Phlebitis    [x] Swelling in legs   [] Varicose veins   [] Non-healing ulcers Pulmonary:   [] Uses home oxygen   [] Productive cough   [] Hemoptysis   [] Wheeze  [] COPD   [] Asthma Neurologic:  [] Dizziness  [] Blackouts   [] Seizures   [] History of stroke   [] History of TIA  [] Aphasia   [] Temporary blindness   [] Dysphagia   [] Weakness or numbness in arms   [] Weakness or numbness in legs Musculoskeletal:  [] Arthritis   [] Joint  swelling   [] Joint pain   [] Low back pain Hematologic:  [] Easy bruising  [] Easy bleeding   [] Hypercoagulable state   [] Anemic  [] Hepatitis Gastrointestinal:  [] Blood in stool   [] Vomiting blood  [] Gastroesophageal reflux/heartburn   [] Difficulty swallowing. Genitourinary:  [] Chronic kidney disease   [] Difficult urination  [] Frequent urination  [] Burning with urination   [] Blood in urine Skin:  [] Rashes   [x] Ulcers   [x] Wounds Psychological:  [] History of anxiety   []  History of major depression.  Physical Examination  Filed Vitals:   11/21/15 1500 11/21/15 1646 11/21/15 1659 11/21/15 2017  BP: 119/62 152/70  130/56  Pulse: 63 62  66  Temp:  94.9 F (34.9 C)  97.4 F (36.3 C)  TempSrc:  Oral  Oral  Resp: 29 18  19   Height:   6' (1.829 m)   Weight:   82.555 kg (182 lb)   SpO2: 95% 94%  96%   Body mass index is 24.68 kg/(m^2). Gen:  WD/WN, NAD Head: [place]/AT, No temporalis wasting. Prominent temp pulse not noted. Ear/Nose/Throat: Hearing grossly intact, nares w/o erythema or drainage, oropharynx w/o Erythema/Exudate Eyes: PERRLA, EOMI.  Neck: Supple, no nuchal rigidity.  No bruit or JVD.  Pulmonary:  Good air movement, clear to auscultation bilaterally.  Cardiac: RRR, normal S1, S2, no Murmurs, rubs or gallops. Vascular:  Vessel Right Left  Radial Palpable Palpable  Ulnar Palpable Palpable  Brachial Palpable Palpable  Carotid Palpable, without bruit Palpable, without bruit  Aorta Not palpable N/A  Femoral Palpable Palpable  Popliteal Non-Palpable Non-Palpable  PT Non-Palpable  Non-Palpable  DP Non-Palpable Non-Palpable   Gastrointestinal: soft, non-tender/non-distended. No guarding/reflex. No masses. Musculoskeletal: Decreased strength upper and lower extremities. Neurologic: CN 2-12 intact. Pain and light touch intact in extremities.  Symmetrical.  Speech is fluent. Motor exam as listed above. Psychiatric: Judgment intact, Mood & affect appropriate for pt's clinical situation. Dermatologic: Left heel ulcer which is well demarcated and has no acute drainage noted. Right heel ulcer forming. Stage I decubitus ulcer Lymph : No Cervical, Axillary, or Inguinal lymphadenopathy.  CBC Lab Results  Component Value Date   WBC 9.7 11/21/2015   HGB 11.8* 11/21/2015   HCT 35.6* 11/21/2015   MCV 90.2 11/21/2015   PLT 420 11/21/2015   BMET    Component Value Date/Time   NA 138 11/21/2015 1236   NA 142 09/27/2015 1506   NA 139 10/30/2013 0715   K 3.2* 11/21/2015 1236   K 4.2 10/30/2013 0715   CL 102 11/21/2015 1236   CL 104 10/30/2013 0715   CO2 27 11/21/2015 1236   CO2 29 10/30/2013 0715   GLUCOSE 242* 11/21/2015 1236   GLUCOSE 175* 09/27/2015 1506   GLUCOSE 162* 10/30/2013 0715   BUN 21* 11/21/2015 1236   BUN 24 09/27/2015 1506   BUN 16 10/30/2013 0715   CREATININE 1.12 11/21/2015 1236   CREATININE 1.15 10/30/2013 0715   CREATININE 1.34 10/21/2012 1057   CALCIUM 8.9 11/21/2015 1236   CALCIUM 9.2 10/30/2013 0715   GFRNONAA 57* 11/21/2015 1236   GFRNONAA 57* 10/30/2013 0715   GFRAA >60 11/21/2015 1236   GFRAA >60 10/30/2013 0715   Estimated Creatinine Clearance: 50 mL/min (by C-G formula based on Cr of 1.12).  COAG Lab Results  Component Value Date   INR 1.1 09/29/2008   Radiology Dg Chest 1 View  11/08/2015  CLINICAL DATA:  Status post PICC line placement EXAM: CHEST 1 VIEW COMPARISON:  Not 07/14/2015 FINDINGS: Cardiac shadow is within normal  limits. Elevation of the right hemidiaphragm is again seen. A new right PICC line is noted with the tip  projecting at the cavoatrial junction. No other focal abnormality is seen. No bony abnormality is noted. IMPRESSION: PICC line at the SVC/ RA junction. Electronically Signed   By: Alcide Clever M.D.   On: 11/08/2015 21:53   Dg Foot Complete Left  11/04/2015  CLINICAL DATA:  Medial forefoot and medial heel ulcers. EXAM: LEFT FOOT - COMPLETE 3+ VIEW COMPARISON:  07/22/2015 FINDINGS: Marked hallux valgus deformity identified. Status post osteotomy of the distal aspect of the fifth metatarsal. Soft tissue ulceration overlying in the first MTP joint is identified. No underlying bone erosion identified. Hammertoe deformities are present. Diffuse soft tissue swelling. IMPRESSION: 1. Soft tissue ulceration overlying the first MTP joint. No underlying bone erosion noted. 2. Chronic changes as above Electronically Signed   By: Signa Kell M.D.   On: 11/04/2015 12:11   Assessment/Plan 82 year old male well know to our practice, treated for lower extremity PAD with ulceration. Last intervention approximately three months ago. Presents from nursing home with worsening left heel ulceration.  1) Recommend left lower extremity angiogram in an attempt to revascularize the extremity. Possibly followed by right lower extremity angiogram. Will plan on left extremity on Wed with Dr. Gilda Crease.  Procedure, risks and benefits explained to patient and family. All questions answered. Patient and family wish to proceed.  2) Recommend wound consult for local wound care  3) Discussed with Dr. Romie Jumper, PA-C  11/21/2015 10:36 PM

## 2015-11-21 NOTE — ED Provider Notes (Signed)
[facility] Emergency Department Provider Note    ____________________________________________  Time seen: ~1200  I have reviewed the triage vital signs and the nursing notes.   HISTORY  Chief Complaint Foot Injury   History limited by: Not Limited   HPI [name] is a 82 y.o. male history of peripheral arterial disease, diabetes, recent left great toe amputation secondary to infection who presents to the emergency department at the request of podiatry because of concerns for worsening heel ulcer. Since discharge from the amputation surgery the patient has been at nursing facility. While there he developed worsening heel ulcers. The left heel has now turned black and has become necrotic. The patient daughter showed a podiatry images of the wounds and podiatry recommended admission for likely debridement and vascular surgery consult. Patient denies any fevers. No chills.   Past Medical History  Diagnosis Date  . Coronary artery disease   . Diabetes mellitus without complication (HCC)   . Aortic stenosis   . Hypertension   . Hyperlipidemia   . TIA (transient ischemic attack)   . Thyroid disease   . Hypothyroidism     Patient Active Problem List   Diagnosis Date Noted  . Diabetic foot (HCC) 11/04/2015  . PAD (peripheral artery disease) (HCC) 10/28/2015  . Diabetes mellitus without complication (HCC) 07/11/2015  . Clavicle enlargement 07/11/2015  . Poorly controlled type 2 diabetes mellitus (HCC) 06/23/2014  . Bed sore on heel, right, unstageable (HCC) 09/23/2013  . Coronary atherosclerosis of native coronary artery 03/27/2013  . Chronic diastolic CHF (congestive heart failure) (HCC) 03/27/2013  . Aortic valve stenosis 03/27/2013  . Essential hypertension 03/27/2013  . Hyperlipidemia 03/27/2013  . Atrial fibrillation (HCC) 03/27/2013  . Bilateral leg edema 03/27/2013  . Constipation 03/27/2013    Past Surgical History  Procedure  Laterality Date  . Appendectomy    . Hernia repair    . Knee surgery      bilateral  . Eye surgery    . Colonoscopy    . Cardiac catheterization  2010    showed a patient circumflex stent with noncritical CAD and normal right-sided pressures.  . Coronary angioplasty  01/24/2007    stent placement to the mid circumflex   . Knee surgery Left   . Peripheral vascular catheterization N/A 08/30/2015    Procedure: Abdominal Aortogram w/Lower Extremity;  Surgeon: Renford Dills, MD;  Location: [facility] INVASIVE CV LAB;  Service: Cardiovascular;  Laterality: N/A;  . Peripheral vascular catheterization  08/30/2015    Procedure: Lower Extremity Intervention;  Surgeon: Renford Dills, MD;  Location: [facility] INVASIVE CV LAB;  Service: Cardiovascular;;  . Amputation toe Left 11/05/2015    Procedure: AMPUTATION TOE;  Surgeon: Recardo Evangelist, DPM;  Location: [facility] ORS;  Service: Podiatry;  Laterality: Left;  . Irrigation and debridement foot Left 11/05/2015    Procedure: IRRIGATION AND DEBRIDEMENT FOOT / HEEL ULCER;  Surgeon: Recardo Evangelist, DPM;  Location: [facility] ORS;  Service: Podiatry;  Laterality: Left;  . Application of wound vac Left 11/05/2015    Procedure: APPLICATION OF WOUND VAC;  Surgeon: Recardo Evangelist, DPM;  Location: [facility] ORS;  Service: Podiatry;  Laterality: Left;    Current Outpatient Rx  Name  Route  Sig  Dispense  Refill  . amiodarone (PACERONE) 200 MG tablet      TAKE ONE-HALF TABLET TWICE A DAY   90 tablet   2   . aspirin EC 81 MG tablet   Oral   Take 1  tablet (81 mg total) by mouth daily.   150 tablet   2   . carbidopa-levodopa (SINEMET IR) 25-100 MG per tablet   Oral   Take 1 tablet by mouth 4 (four) times daily.         . clopidogrel (PLAVIX) 75 MG tablet      TAKE 1 TABLET EVERY DAY   30 tablet   3   . cyanocobalamin (,VITAMIN B-12,) 1000 MCG/ML injection   Intramuscular   Inject 1 mL (1,000 mcg total) into the muscle every 30 (thirty) days. 1 injection in 2 weeks  then every 30 days   1 mL   12   . feeding supplement, GLUCERNA SHAKE, (GLUCERNA SHAKE) LIQD   Oral   Take 237 mLs by mouth 3 (three) times daily between meals.      0   . furosemide (LASIX) 40 MG tablet   Oral   Take 1 tablet (40 mg total) by mouth 2 (two) times daily.   60 tablet   6   . levothyroxine (SYNTHROID, LEVOTHROID) 150 MCG tablet   Oral   Take 150 mcg by mouth daily before breakfast.         . oxyCODONE-acetaminophen (PERCOCET/ROXICET) 5-325 MG tablet   Oral   Take 1 tablet by mouth every 4 (four) hours as needed for moderate pain.   30 tablet   0   . potassium chloride (K-DUR,KLOR-CON) 10 MEQ tablet   Oral   Take 10 mEq by mouth daily.          . pramipexole (MIRAPEX) 1 MG tablet      Take 1/2 tablet four times daily.         . sitaGLIPtin (JANUVIA) 100 MG tablet   Oral   Take 1 tablet (100 mg total) by mouth daily.   90 tablet   1   . tamsulosin (FLOMAX) 0.4 MG CAPS capsule   Oral   Take 1 capsule (0.4 mg total) by mouth daily after supper.   30 capsule   6     Allergies Review of patient's allergies indicates no known allergies.  Family History  Problem Relation Age of Onset  . Heart disease Mother   . Heart attack Father 8462    MI  . Heart attack Brother 4839    MI    Social History Social History  Substance Use Topics  . Smoking status: Never Smoker   . Smokeless tobacco: Never Used  . Alcohol Use: No    Review of Systems  Constitutional: Negative for fever. Cardiovascular: Negative for chest pain. Respiratory: Negative for shortness of breath. Gastrointestinal: Negative for abdominal pain, vomiting and diarrhea. Skin: Positive for skin ulcer. Neurological: Negative for headaches, focal weakness or numbness.  10-point ROS otherwise negative.  ____________________________________________   PHYSICAL EXAM:  VITAL SIGNS: ED Triage Vitals  Enc Vitals Group     BP 11/21/15 1053 162/78 mmHg     Pulse Rate 11/21/15  1053 75     Resp 11/21/15 1053 18     Temp 11/21/15 1053 97.5 F (36.4 C)     Temp Source 11/21/15 1053 Oral     SpO2 11/21/15 1053 97 %     Weight 11/21/15 1053 187 lb (84.823 kg)     Height 11/21/15 1053 6' (1.829 m)     Head Cir --      Peak Flow --      Pain Score 11/21/15 1054 0   Constitutional: Alert and  oriented. Well appearing and in no distress. Eyes: Conjunctivae are normal. PERRL. Normal extraocular movements. ENT   Head: Normocephalic and atraumatic.   Nose: No congestion/rhinnorhea.   Mouth/Throat: Mucous membranes are moist.   Neck: No stridor. Hematological/Lymphatic/Immunilogical: No cervical lymphadenopathy. Cardiovascular: Normal rate, regular rhythm.  No murmurs, rubs, or gallops. Respiratory: Normal respiratory effort without tachypnea nor retractions. Breath sounds are clear and equal bilaterally. No wheezes/rales/rhonchi. Gastrointestinal: Soft and nontender. No distention. There is no CVA tenderness. Genitourinary: Deferred Musculoskeletal: Normal range of motion in all extremities. No joint effusions.   Neurologic:  Normal speech and language. No gross focal neurologic deficits are appreciated.  Skin:  Surgical incision site of the left great toe appears to be healing well. Patient does have a large necrotic ulcer on the left heel. Right heel with beginning stages of pressure ulcer. Psychiatric: Mood and affect are normal. Speech and behavior are normal. Patient exhibits appropriate insight and judgment.  ____________________________________________    LABS (pertinent positives/negatives)  Labs Reviewed  CBC WITH DIFFERENTIAL/PLATELET - Abnormal; Notable for the following:    RBC 3.95 (*)    Hemoglobin 11.8 (*)    HCT 35.6 (*)    RDW 15.7 (*)    Neutro Abs 7.5 (*)    All other components within normal limits  BASIC METABOLIC PANEL - Abnormal; Notable for the following:    Potassium 3.2 (*)    Glucose, Bld 242 (*)    BUN 21 (*)    GFR  calc non Af Amer 57 (*)    All other components within normal limits  CULTURE, BLOOD (ROUTINE X 2)  CULTURE, BLOOD (ROUTINE X 2)     ____________________________________________   EKG  None  ____________________________________________    RADIOLOGY  None  ____________________________________________   PROCEDURES  Procedure(s) performed: None  Critical Care performed: No  ____________________________________________   INITIAL IMPRESSION / ASSESSMENT AND PLAN / ED COURSE  Pertinent labs & imaging results that were available during my care of the patient were reviewed by me and considered in my medical decision making (see chart for details).  Patient presents to the emergency department at the request of his podiatrist for admission for worsening heel ulcer. On exam patient does have a necrotic heel ulcer on the left side. Will plan on sending basic blood work and giving IV antibiotics.  ____________________________________________   FINAL CLINICAL IMPRESSION(S) / ED DIAGNOSES  Final diagnoses:  Foot ulcer, left, with unspecified severity [facility])     Note: This dictation was prepared with Dragon dictation. Any transcriptional errors that result from this process are unintentional    Phineas Semen, MD 11/21/15 1415

## 2015-11-21 NOTE — ED Notes (Signed)
New sores both feet, daughter talked to patients MD and sent in for eval, had surgery L foot 6/3 amputation of toe and debriedment.

## 2015-11-21 NOTE — Progress Notes (Signed)
Patient was admitted to room 151 from ER via cart. PICC line and NSL in place. Wounds to bilat heels and buttocks were recorded. A&O x4. Daughter, POA was at bedside.

## 2015-11-21 NOTE — Progress Notes (Addendum)
ANTIBIOTIC CONSULT NOTE - INITIAL  Pharmacy Consult for Vancomycin/Zosyn  Indication: wound infection  No Known Allergies  Patient Measurements: Height: 6' (182.9 cm) Weight: 182 lb (82.555 kg) IBW/kg (Calculated) : 77.6 Adjusted Body Weight:   Vital Signs: Temp: 94.9 F (34.9 C) (06/19 1646) Temp Source: Oral (06/19 1646) BP: 152/70 mmHg (06/19 1646) Pulse Rate: 62 (06/19 1646) Intake/Output from previous day:   Intake/Output from this shift: Total I/O In: 240 [P.O.:240] Out: -   Labs:  Recent Labs  11/21/15 1236  WBC 9.7  HGB 11.8*  PLT 420  CREATININE 1.12   Estimated Creatinine Clearance: 50 mL/min (by C-G formula based on Cr of 1.12). No results for input(s): VANCOTROUGH, VANCOPEAK, VANCORANDOM, GENTTROUGH, GENTPEAK, GENTRANDOM, TOBRATROUGH, TOBRAPEAK, TOBRARND, AMIKACINPEAK, AMIKACINTROU, AMIKACIN in the last 72 hours.   Microbiology: Recent Results (from the past 720 hour(s))  WOUND CULTURE ([facility] ONLY)     Status: None   Collection Time: 11/01/15  1:20 PM  Result Value Ref Range Status   Specimen Description HEEL  Final   Special Requests LEFT  Final   Gram Stain RARE WBC SEEN NO ORGANISMS SEEN   Final   Culture   Final    LIGHT GROWTH PROTEUS MIRABILIS LIGHT GROWTH METHICILLIN RESISTANT STAPHYLOCOCCUS AUREUS MODERATE GROWTH ENTEROCOCCUS FAECALIS    Report Status 11/09/2015 FINAL  Final   Organism ID, Bacteria PROTEUS MIRABILIS  Final   Organism ID, Bacteria METHICILLIN RESISTANT STAPHYLOCOCCUS AUREUS  Final   Organism ID, Bacteria ENTEROCOCCUS FAECALIS  Final      Susceptibility   Proteus mirabilis - MIC*    AMPICILLIN <=2 SENSITIVE Sensitive     CEFAZOLIN <=4 SENSITIVE Sensitive     CEFEPIME <=1 SENSITIVE Sensitive     CEFTAZIDIME <=1 SENSITIVE Sensitive     CEFTRIAXONE <=1 SENSITIVE Sensitive     CIPROFLOXACIN <=0.25 SENSITIVE Sensitive     GENTAMICIN <=1 SENSITIVE Sensitive     IMIPENEM 2 SENSITIVE Sensitive     TRIMETH/SULFA <=20  SENSITIVE Sensitive     AMPICILLIN/SULBACTAM <=2 SENSITIVE Sensitive     PIP/TAZO <=4 SENSITIVE Sensitive     * LIGHT GROWTH PROTEUS MIRABILIS   Enterococcus faecalis - MIC*    AMPICILLIN SENSITIVE Sensitive     LINEZOLID SENSITIVE Sensitive     * MODERATE GROWTH ENTEROCOCCUS FAECALIS   Methicillin resistant staphylococcus aureus - MIC*    CIPROFLOXACIN >=8 RESISTANT Resistant     ERYTHROMYCIN >=8 RESISTANT Resistant     GENTAMICIN <=0.5 SENSITIVE Sensitive     OXACILLIN >=4 RESISTANT Resistant     TETRACYCLINE 2 SENSITIVE Sensitive     VANCOMYCIN 1 SENSITIVE Sensitive     TRIMETH/SULFA <=10 SENSITIVE Sensitive     CLINDAMYCIN >=8 RESISTANT Resistant     RIFAMPIN <=0.5 SENSITIVE Sensitive     Inducible Clindamycin NEGATIVE Sensitive     * LIGHT GROWTH METHICILLIN RESISTANT STAPHYLOCOCCUS AUREUS  Surgical pcr screen     Status: Abnormal   Collection Time: 11/04/15  6:11 PM  Result Value Ref Range Status   MRSA, PCR POSITIVE (A) NEGATIVE Final    Comment: CRITICAL RESULT CALLED TO, READ BACK BY AND VERIFIED WITH: MARY RAYMOND AT 2324 11/04/15.PMH    Staphylococcus aureus POSITIVE (A) NEGATIVE Final    Comment:        The Xpert SA Assay (FDA approved for NASAL specimens in patients over 80 years of age), is one component of a comprehensive surveillance program.  Test performance has been validated by [facility] for  patients greater than or equal to 8 year old. It is not intended to diagnose infection nor to guide or monitor treatment.   Anaerobic culture     Status: None   Collection Time: 11/05/15 12:17 PM  Result Value Ref Range Status   Specimen Description WOUND  Final   Special Requests NONE  Final   Culture NO ANAEROBES ISOLATED  Final   Report Status 11/11/2015 FINAL  Final  WOUND CULTURE ([facility] ONLY)     Status: None   Collection Time: 11/05/15 12:17 PM  Result Value Ref Range Status   Specimen Description WOUND  Final   Special Requests NONE  Final   Gram  Stain   Final    FEW RED BLOOD CELLS RARE WBC SEEN NO ORGANISMS SEEN    Culture   Final    LIGHT GROWTH PROTEUS MIRABILIS LIGHT GROWTH STAPHYLOCOCCUS AUREUS    Report Status 11/08/2015 FINAL  Final   Organism ID, Bacteria PROTEUS MIRABILIS  Final   Organism ID, Bacteria PROTEUS MIRABILIS  Final   Organism ID, Bacteria STAPHYLOCOCCUS AUREUS  Final   Organism ID, Bacteria STAPHYLOCOCCUS AUREUS  Final      Susceptibility   Staphylococcus aureus - MIC*    CEFAZOLIN RESISTANT Resistant     LEVOFLOXACIN RESISTANT Resistant     OXACILLIN RESISTANT Resistant     TRIMETH/SULFA SENSITIVE Sensitive     VANCOMYCIN SENSITIVE Sensitive     TETRACYCLINE SENSITIVE Sensitive    Staphylococcus aureus - MIC*    CIPROFLOXACIN >=8 RESISTANT Resistant     ERYTHROMYCIN >=8 RESISTANT Resistant     GENTAMICIN <=0.5 SENSITIVE Sensitive     OXACILLIN >=4 RESISTANT Resistant     TETRACYCLINE <=1 SENSITIVE Sensitive     VANCOMYCIN 1 SENSITIVE Sensitive     TRIMETH/SULFA <=10 SENSITIVE Sensitive     CLINDAMYCIN >=8 RESISTANT Resistant     RIFAMPIN <=0.5 SENSITIVE Sensitive     Inducible Clindamycin NEGATIVE Sensitive     * LIGHT GROWTH STAPHYLOCOCCUS AUREUS    LIGHT GROWTH STAPHYLOCOCCUS AUREUS   Proteus mirabilis - MIC*    AMPICILLIN RESISTANT Resistant     AMPICILLIN/SULBACTAM RESISTANT Resistant     CEFAZOLIN RESISTANT Resistant     CIPROFLOXACIN SENSITIVE Sensitive     GENTAMICIN SENSITIVE Sensitive     IMIPENEM SENSITIVE Sensitive     TRIMETH/SULFA SENSITIVE Sensitive    Proteus mirabilis - MIC*    AMPICILLIN >=32 RESISTANT Resistant     CEFAZOLIN >=64 RESISTANT Resistant     CEFEPIME <=1 RESISTANT Resistant     CEFTAZIDIME 16 RESISTANT Resistant     CEFTRIAXONE 8 RESISTANT Resistant     CIPROFLOXACIN <=0.25 SENSITIVE Sensitive     GENTAMICIN <=1 SENSITIVE Sensitive     IMIPENEM 1 SENSITIVE Sensitive     TRIMETH/SULFA <=20 SENSITIVE Sensitive     AMPICILLIN/SULBACTAM >=32 RESISTANT  Resistant     PIP/TAZO <=4 SENSITIVE Sensitive     * LIGHT GROWTH PROTEUS MIRABILIS    LIGHT GROWTH PROTEUS MIRABILIS    Medical History: Past Medical History  Diagnosis Date  . Coronary artery disease   . Diabetes mellitus without complication (HCC)   . Aortic stenosis   . Hypertension   . Hyperlipidemia   . TIA (transient ischemic attack)   . Thyroid disease   . Hypothyroidism     Medications:  Prescriptions prior to admission  Medication Sig Dispense Refill Last Dose  . amiodarone (PACERONE) 200 MG tablet TAKE ONE-HALF TABLET TWICE A DAY  90 tablet 2 11/21/2015 at unknown  . aspirin EC 81 MG tablet Take 1 tablet (81 mg total) by mouth daily. 150 tablet 2 11/21/2015 at unknown  . carbidopa-levodopa (SINEMET IR) 25-100 MG per tablet Take 1 tablet by mouth 4 (four) times daily.   11/21/2015 at unknown  . clopidogrel (PLAVIX) 75 MG tablet TAKE 1 TABLET EVERY DAY 30 tablet 3 11/21/2015 at unknown  . colchicine 0.6 MG tablet Take 0.6 mg by mouth daily. For 10 days.   11/20/2015 at unknown  . cyanocobalamin (,VITAMIN B-12,) 1000 MCG/ML injection Inject 1 mL (1,000 mcg total) into the muscle every 30 (thirty) days. 1 injection in 2 weeks then every 30 days 1 mL 12 Past Month at unknown  . feeding supplement, GLUCERNA SHAKE, (GLUCERNA SHAKE) LIQD Take 237 mLs by mouth 3 (three) times daily between meals.  0 Past Month at unknown  . furosemide (LASIX) 40 MG tablet Take 1 tablet (40 mg total) by mouth 2 (two) times daily. 60 tablet 6 11/21/2015 at unknown  . levothyroxine (SYNTHROID, LEVOTHROID) 150 MCG tablet Take 150 mcg by mouth daily before breakfast.   11/21/2015 at unknown  . oxyCODONE-acetaminophen (PERCOCET/ROXICET) 5-325 MG tablet Take 1 tablet by mouth every 4 (four) hours as needed for moderate pain. 30 tablet 0 11/20/2015 at unknown  . piperacillin-tazobactam (ZOSYN) 3.375 GM/50ML IVPB Inject 3.375 g into the vein every 8 (eight) hours.   11/21/2015 at unknown  . potassium chloride  (K-DUR,KLOR-CON) 10 MEQ tablet Take 10 mEq by mouth daily.    11/21/2015 at unknown  . pramipexole (MIRAPEX) 1 MG tablet Take 1/2 tablet four times daily.   11/21/2015 at unknown  . predniSONE (DELTASONE) 20 MG tablet Take 20 mg by mouth daily. For 5 days   11/20/2015 at unknown  . sitaGLIPtin (JANUVIA) 100 MG tablet Take 1 tablet (100 mg total) by mouth daily. 90 tablet 1 11/21/2015 at unknown  . tamsulosin (FLOMAX) 0.4 MG CAPS capsule Take 1 capsule (0.4 mg total) by mouth daily after supper. 30 capsule 6 11/21/2015 at unknown  . VANCOMYCIN HCL IV Inject 1 g into the vein every 12 (twelve) hours.   11/21/2015 at unknown   Assessment: Pharmacy consulted to dose vanc and zosyn in this 82 year old male admitted from [facility] with infected heel ulcer.  Pt was being treated at [facility] with Vanc an Zosyn but pt's condition continued to worsen.   Will draw random Vanc level on 6/19 @ 16:00.   Pseudomonas risk factors:  Prior use of Vanc and Zosyn as outpt, which pt apparently failed.   CrCl = 51.3 ml/min Ke = 0.047 hr-1 T1/2 = 14.7 hrs VD = 57.8 L   Pt received Vanc 1 gm IV X 1 in ED on 6/19 @ 12:42. 6/19 :  Random Vanc level @ 16:00 = 30 mcg/mL     Goal of Therapy:  Vancomycin trough level 15-20 mcg/ml  Plan:  Expected duration 7 days with resolution of temperature and/or normalization of WBC   Zosyn 4.5 gm IV Q8H EI ordered to start on 6/19 @ 20:00.   Vanc level on 6/19 @ 18:00 was 30 mcg/mL. Therefore, will wait 12 hrs ( approximately 1 half-life) before resuming vancomycin.  Vancomycin 1 gm IV Q12H ordered to start 6/20 @ 6:00. Will draw 1st trough on 6/21 @ 5:30.   Geetika Laborde D 11/21/2015,6:20 PM

## 2015-11-21 NOTE — H&P (Signed)
[employer] [facility] [place] [facility]   PATIENT NAME: [name]    MR#:  [id number]  DATE OF BIRTH:  [birth year]  DATE OF ADMISSION:  11/21/2015  PRIMARY CARE PHYSICIAN: Vonita Moss, MD   REQUESTING/REFERRING PHYSICIAN: Dr. Phineas Semen  CHIEF COMPLAINT:   Chief Complaint  Patient presents with  . Foot Injury    HISTORY OF PRESENT ILLNESS:  [name]  is a 82 y.o. male with a known history of Vascular disease, coronary artery disease, diabetes type 2 without complication, aortic stenosis, hypertension, hyperlipidemia, history of previous TIA, hypothyroidism who was sent to the ER from his podiatrist's office due to a left heel ulcer that is not improving. Patient was recently hospitalized for a vascular ulcer on the left foot and underwent fifth toe ray amputation. Patient was discharged to a skilled nursing facility with wound care and also minimal weightbearing on that left leg with frequent turning. Patient has developed a left heel ulcer at the facility due to the fact that he was not being repositioned frequently. Patient's daughter took pictures of the ulcer and sent him to Dr. Orland Jarred his podiatrist. The podiatrist and recommended that the patient be brought to the hospital for further debridement and also possible vascular surgery intervention. He denies any other complaints of fever, chills, nausea, vomiting, chest pain, abdominal pain or any other associated symptoms.  PAST MEDICAL HISTORY:   Past Medical History  Diagnosis Date  . Coronary artery disease   . Diabetes mellitus without complication (HCC)   . Aortic stenosis   . Hypertension   . Hyperlipidemia   . TIA (transient ischemic attack)   . Thyroid disease   . Hypothyroidism     PAST SURGICAL HISTORY:   Past Surgical History  Procedure Laterality Date  . Appendectomy    . Hernia repair    . Knee surgery      bilateral  . Eye surgery    . Colonoscopy    . Cardiac catheterization   2010    showed a patient circumflex stent with noncritical CAD and normal right-sided pressures.  . Coronary angioplasty  01/24/2007    stent placement to the mid circumflex   . Knee surgery Left   . Peripheral vascular catheterization N/A 08/30/2015    Procedure: Abdominal Aortogram w/Lower Extremity;  Surgeon: Renford Dills, MD;  Location: [facility] INVASIVE CV LAB;  Service: Cardiovascular;  Laterality: N/A;  . Peripheral vascular catheterization  08/30/2015    Procedure: Lower Extremity Intervention;  Surgeon: Renford Dills, MD;  Location: [facility] INVASIVE CV LAB;  Service: Cardiovascular;;  . Amputation toe Left 11/05/2015    Procedure: AMPUTATION TOE;  Surgeon: Recardo Evangelist, DPM;  Location: [facility] ORS;  Service: Podiatry;  Laterality: Left;  . Irrigation and debridement foot Left 11/05/2015    Procedure: IRRIGATION AND DEBRIDEMENT FOOT / HEEL ULCER;  Surgeon: Recardo Evangelist, DPM;  Location: [facility] ORS;  Service: Podiatry;  Laterality: Left;  . Application of wound vac Left 11/05/2015    Procedure: APPLICATION OF WOUND VAC;  Surgeon: Recardo Evangelist, DPM;  Location: [facility] ORS;  Service: Podiatry;  Laterality: Left;    SOCIAL HISTORY:   Social History  Substance Use Topics  . Smoking status: Never Smoker   . Smokeless tobacco: Never Used  . Alcohol Use: No    FAMILY HISTORY:   Family History  Problem Relation Age of Onset  . Heart disease Mother   . Heart attack Father 9    MI  .  Heart attack Brother 39    MI    DRUG ALLERGIES:  No Known Allergies  REVIEW OF SYSTEMS:   Review of Systems  Constitutional: Negative for fever and weight loss.  HENT: Negative for congestion, nosebleeds and tinnitus.   Eyes: Negative for blurred vision, double vision and redness.  Respiratory: Negative for cough, hemoptysis and shortness of breath.   Cardiovascular: Negative for chest pain, orthopnea, leg swelling and PND.  Gastrointestinal: Negative for nausea, vomiting, abdominal pain, diarrhea  and melena.  Genitourinary: Negative for dysuria, urgency and hematuria.  Musculoskeletal: Negative for joint pain and falls.  Neurological: Negative for dizziness, tingling, sensory change, focal weakness, seizures, weakness and headaches.  Endo/Heme/Allergies: Negative for polydipsia. Does not bruise/bleed easily.  Psychiatric/Behavioral: Negative for depression and memory loss. The patient is not nervous/anxious.     MEDICATIONS AT HOME:   Prior to Admission medications   Medication Sig Start Date End Date Taking? Authorizing Provider  amiodarone (PACERONE) 200 MG tablet TAKE ONE-HALF TABLET TWICE A DAY 09/09/15  Yes Antonieta Ibaimothy J Gollan, MD  aspirin EC 81 MG tablet Take 1 tablet (81 mg total) by mouth daily. 08/30/15  Yes Renford DillsGregory G Schnier, MD  carbidopa-levodopa (SINEMET IR) 25-100 MG per tablet Take 1 tablet by mouth 4 (four) times daily.   Yes Historical Provider, MD  clopidogrel (PLAVIX) 75 MG tablet TAKE 1 TABLET EVERY DAY 10/17/15  Yes Antonieta Ibaimothy J Gollan, MD  colchicine 0.6 MG tablet Take 0.6 mg by mouth daily. For 10 days. 11/18/15 11/28/15 Yes Historical Provider, MD  cyanocobalamin (,VITAMIN B-12,) 1000 MCG/ML injection Inject 1 mL (1,000 mcg total) into the muscle every 30 (thirty) days. 1 injection in 2 weeks then every 30 days 09/19/15  Yes Steele SizerMark A Crissman, MD  furosemide (LASIX) 40 MG tablet Take 1 tablet (40 mg total) by mouth 2 (two) times daily. 07/29/15  Yes Antonieta Ibaimothy J Gollan, MD  levothyroxine (SYNTHROID, LEVOTHROID) 150 MCG tablet Take 150 mcg by mouth daily before breakfast.   Yes Historical Provider, MD  oxyCODONE-acetaminophen (PERCOCET/ROXICET) 5-325 MG tablet Take 1 tablet by mouth every 4 (four) hours as needed for moderate pain. 11/09/15  Yes Auburn BilberryShreyang Patel, MD  piperacillin-tazobactam (ZOSYN) 3.375 GM/50ML IVPB Inject 3.375 g into the vein every 8 (eight) hours. 11/10/15 11/23/15 Yes Historical Provider, MD  potassium chloride (K-DUR,KLOR-CON) 10 MEQ tablet Take 10 mEq by mouth  daily.  10/07/15  Yes Historical Provider, MD  pramipexole (MIRAPEX) 1 MG tablet Take 1/2 tablet four times daily.   Yes Historical Provider, MD  predniSONE (DELTASONE) 20 MG tablet Take 20 mg by mouth daily. For 5 days 11/18/15 11/23/15 Yes Historical Provider, MD  sitaGLIPtin (JANUVIA) 100 MG tablet Take 1 tablet (100 mg total) by mouth daily. 07/11/15  Yes Steele SizerMark A Crissman, MD  tamsulosin (FLOMAX) 0.4 MG CAPS capsule Take 1 capsule (0.4 mg total) by mouth daily after supper. 10/17/15  Yes Steele SizerMark A Crissman, MD  VANCOMYCIN HCL IV Inject 1 g into the vein every 12 (twelve) hours. 11/16/15  Yes Historical Provider, MD  feeding supplement, GLUCERNA SHAKE, (GLUCERNA SHAKE) LIQD Take 237 mLs by mouth 3 (three) times daily between meals. 11/09/15   Auburn BilberryShreyang Patel, MD      VITAL SIGNS:  Blood pressure 119/62, pulse 63, temperature 97.5 F (36.4 C), temperature source Oral, resp. rate 29, height 6' (1.829 m), weight 84.823 kg (187 lb), SpO2 95 %.  PHYSICAL EXAMINATION:  Physical Exam  GENERAL:  82 y.o.-year-old patient lying in the bed with  no acute distress.  EYES: Pupils equal, round, reactive to light and accommodation. No scleral icterus. Extraocular muscles intact.  HEENT: Head atraumatic, normocephalic. Oropharynx and nasopharynx clear. No oropharyngeal erythema, moist oral mucosa  NECK:  Supple, no jugular venous distention. No thyroid enlargement, no tenderness.  LUNGS: Normal breath sounds bilaterally, no wheezing, rales, rhonchi. No use of accessory muscles of respiration.  CARDIOVASCULAR: S1, S2 RRR. II/VI SEM, rubs, gallops, clicks.  ABDOMEN: Soft, nontender, nondistended. Bowel sounds present. No organomegaly or mass.  EXTREMITIES: No pedal edema, cyanosis, or clubbing. + 2 pedal & radial pulses b/l.  Status post left fifth toe ray amputation with sutures in place. Patient has a left heel ulcer which is well demarcated and has no acute drainage noted. NEUROLOGIC: Cranial nerves II through XII are  intact. No focal Motor or sensory deficits appreciated b/l PSYCHIATRIC: The patient is alert and oriented x 3. Good affect.  SKIN: No obvious rash, lesion, left heel ulcer, stage I decubitus ulcer.  LABORATORY PANEL:   CBC  Recent Labs Lab 11/21/15 1236  WBC 9.7  HGB 11.8*  HCT 35.6*  PLT 420   ------------------------------------------------------------------------------------------------------------------  Chemistries   Recent Labs Lab 11/21/15 1236  NA 138  K 3.2*  CL 102  CO2 27  GLUCOSE 242*  BUN 21*  CREATININE 1.12  CALCIUM 8.9   ------------------------------------------------------------------------------------------------------------------  Cardiac Enzymes No results for input(s): TROPONINI in the last 168 hours. ------------------------------------------------------------------------------------------------------------------  RADIOLOGY:  No results found.   IMPRESSION AND PLAN:   82 year old male with past medical history of peripheral vascular disease, coronary artery disease, diabetes type 2 without, patient, history of aortic stenosis, hypertension, hyperlipidemia, history of previous TIA, hypothyroidism who presents to the hospital due to left heel ulcer.  1. Left heel ulcer-patient has developed this secondary to underlying PVD and also patient not being positioned frequently at the skilled nursing facility. It does not appear to have any acute drainage. -Patient is already on empiric antibiotics with vancomycin and Zosyn for his previous left foot infection which I will continue. -I will get a podiatry, vascular surgery consult. Follow clinically. She is currently afebrile and hemodynamically stable. As per podiatry patient will likely need further debridement of this wound.  2. History of Parkinson's disease-continue Sinemet.  3. Hypokalemia-I will place on oral potassium supplements and follow level.  4. Hypothyroidism-continue Synthroid.  5.  History of gout-no acute attack. Continue colchicine.  6. Restless leg syndrome-continue Mirapex.  7. BPH-continue Flomax.  8. Hypothyroidism-continue Synthroid.  9. History of atrial fibrillation-currently rate controlled. Continue amiodarone.   All the records are reviewed and case discussed with ED provider. Management plans discussed with the patient, family and they are in agreement.  CODE STATUS: Full  TOTAL TIME TAKING CARE OF THIS PATIENT: 45 minutes.    Houston Siren M.D on 11/21/2015 at 3:43 PM  Between 7am to 6pm - Pager - [phone]  After 6pm go to [email or web address] - password [email or web address] [facility]  [facility]  Office  [phone]  CC: Primary care physician; Vonita Moss, MD

## 2015-11-21 NOTE — Consult Note (Signed)
Patient Demographics  [name], is a 82 y.o. male   MRN: [id number]   DOB - 1925/01/30  Admit Date - 11/21/2015    Outpatient Primary MD for the patient is Tonette LedererKIMBERLY A STEGMAYER, PA-C  Consult requested in the Hospital by Houston SirenVivek J Sainani, MD, On 11/21/2015    Reason for consult decubitus ulcerations bilateral heels   With History of -  Past Medical History  Diagnosis Date  . Coronary artery disease   . Diabetes mellitus without complication (HCC)   . Aortic stenosis   . Hypertension   . Hyperlipidemia   . TIA (transient ischemic attack)   . Thyroid disease   . Hypothyroidism       Past Surgical History  Procedure Laterality Date  . Appendectomy    . Hernia repair    . Knee surgery      bilateral  . Eye surgery    . Colonoscopy    . Cardiac catheterization  2010    showed a patient circumflex stent with noncritical CAD and normal right-sided pressures.  . Coronary angioplasty  01/24/2007    stent placement to the mid circumflex   . Knee surgery Left   . Peripheral vascular catheterization N/A 08/30/2015    Procedure: Abdominal Aortogram w/Lower Extremity;  Surgeon: Renford DillsGregory G Schnier, MD;  Location: [facility] INVASIVE CV LAB;  Service: Cardiovascular;  Laterality: N/A;  . Peripheral vascular catheterization  08/30/2015    Procedure: Lower Extremity Intervention;  Surgeon: Renford DillsGregory G Schnier, MD;  Location: [facility] INVASIVE CV LAB;  Service: Cardiovascular;;  . Amputation toe Left 11/05/2015    Procedure: AMPUTATION TOE;  Surgeon: Recardo EvangelistMatthew Teddie Curd, DPM;  Location: [facility] ORS;  Service: Podiatry;  Laterality: Left;  . Irrigation and debridement foot Left 11/05/2015    Procedure: IRRIGATION AND DEBRIDEMENT FOOT / HEEL ULCER;  Surgeon: Recardo EvangelistMatthew Tina Gruner, DPM;  Location: [facility] ORS;  Service: Podiatry;  Laterality:  Left;  . Application of wound vac Left 11/05/2015    Procedure: APPLICATION OF WOUND VAC;  Surgeon: Recardo EvangelistMatthew Gwyndolyn Guilford, DPM;  Location: [facility] ORS;  Service: Podiatry;  Laterality: Left;    in for   Chief Complaint  Patient presents with  . Foot Injury     HPI  [name]  is a 82 y.o. male, Readmitted to the hospital today because of ulcerations to the bilateral heels. Left ulceration is the worst with an large area of necrotic eschar on the posterior plantar left heel. He was in the hospital 3 weeks ago noted a first ray amputation along with debridement of the medial heel ulcer and wound VAC. He was doing very well upon leaving the hospital but worsened once he was at a assisted-living area. Return to the office last Wednesday showed new ulcerations and enlargement of the left ulceration along with a new ulceration on the right heel. Also has decubitus ulcerations on the gluteal regions.    Review of Systems  patient is alert and well-oriented and is accompanied by his daughter.  In addition  to the HPI above,  No Fever-chills, No Headache, No changes with Vision or hearing, No problems swallowing food or Liquids, No Chest pain, Cough or Shortness of Breath, No Abdominal pain, No Nausea or Vommitting, Bowel movements are regular, No Blood in stool or Urine, No dysuria, Skin areas show new ulcerations to both heels and also 3 separate new lesions on his gluteal regions. No new joints pains-aches,  No new weakness, tingling, numbness in any extremity, No recent weight gain or loss, No polyuria, polydypsia or polyphagia, No significant Mental Stressors.  A full 10 point Review of Systems was done, except as stated above, all other Review of Systems were negative.   Social History Social History  Substance Use Topics  . Smoking status: Never Smoker   . Smokeless tobacco: Never Used  . Alcohol Use: No     Family History Family History  Problem Relation Age of Onset  . Heart  disease Mother   . Heart attack Father 25    MI  . Heart attack Brother 44    MI     Prior to Admission medications   Medication Sig Start Date End Date Taking? Authorizing Provider  amiodarone (PACERONE) 200 MG tablet TAKE ONE-HALF TABLET TWICE A DAY 09/09/15  Yes Antonieta Iba, MD  aspirin EC 81 MG tablet Take 1 tablet (81 mg total) by mouth daily. 08/30/15  Yes Renford Dills, MD  carbidopa-levodopa (SINEMET IR) 25-100 MG per tablet Take 1 tablet by mouth 4 (four) times daily.   Yes Historical Provider, MD  clopidogrel (PLAVIX) 75 MG tablet TAKE 1 TABLET EVERY DAY 10/17/15  Yes Antonieta Iba, MD  colchicine 0.6 MG tablet Take 0.6 mg by mouth daily. For 10 days. 11/18/15 11/28/15 Yes Historical Provider, MD  cyanocobalamin (,VITAMIN B-12,) 1000 MCG/ML injection Inject 1 mL (1,000 mcg total) into the muscle every 30 (thirty) days. 1 injection in 2 weeks then every 30 days 09/19/15  Yes Steele Sizer, MD  feeding supplement, GLUCERNA SHAKE, (GLUCERNA SHAKE) LIQD Take 237 mLs by mouth 3 (three) times daily between meals. 11/09/15  Yes Auburn Bilberry, MD  furosemide (LASIX) 40 MG tablet Take 1 tablet (40 mg total) by mouth 2 (two) times daily. 07/29/15  Yes Antonieta Iba, MD  levothyroxine (SYNTHROID, LEVOTHROID) 150 MCG tablet Take 150 mcg by mouth daily before breakfast.   Yes Historical Provider, MD  oxyCODONE-acetaminophen (PERCOCET/ROXICET) 5-325 MG tablet Take 1 tablet by mouth every 4 (four) hours as needed for moderate pain. 11/09/15  Yes Auburn Bilberry, MD  piperacillin-tazobactam (ZOSYN) 3.375 GM/50ML IVPB Inject 3.375 g into the vein every 8 (eight) hours. 11/10/15 11/23/15 Yes Historical Provider, MD  potassium chloride (K-DUR,KLOR-CON) 10 MEQ tablet Take 10 mEq by mouth daily.  10/07/15  Yes Historical Provider, MD  pramipexole (MIRAPEX) 1 MG tablet Take 1/2 tablet four times daily.   Yes Historical Provider, MD  predniSONE (DELTASONE) 20 MG tablet Take 20 mg by mouth daily. For 5 days  11/18/15 11/23/15 Yes Historical Provider, MD  sitaGLIPtin (JANUVIA) 100 MG tablet Take 1 tablet (100 mg total) by mouth daily. 07/11/15  Yes Steele Sizer, MD  tamsulosin (FLOMAX) 0.4 MG CAPS capsule Take 1 capsule (0.4 mg total) by mouth daily after supper. 10/17/15  Yes Steele Sizer, MD  VANCOMYCIN HCL IV Inject 1 g into the vein every 12 (twelve) hours. 11/16/15  Yes Historical Provider, MD    Anti-infectives    Start     Dose/Rate  Route Frequency Ordered Stop   11/21/15 2200  vancomycin (VANCOCIN) powder 1,000 mg  Status:  Discontinued     1,000 mg Other Every 12 hours 11/21/15 1655 11/21/15 1712   11/21/15 2000  piperacillin-tazobactam (ZOSYN) IVPB 4.5 g     4.5 g 25 mL/hr over 240 Minutes Intravenous Every 8 hours 11/21/15 1735     11/21/15 1830  piperacillin-tazobactam (ZOSYN) IVPB 3.375 g  Status:  Discontinued     3.375 g 12.5 mL/hr over 240 Minutes Intravenous Every 8 hours 11/21/15 1655 11/21/15 1735   11/21/15 1215  vancomycin (VANCOCIN) IVPB 1000 mg/200 mL premix     1,000 mg 200 mL/hr over 60 Minutes Intravenous  Once 11/21/15 1210 11/21/15 1346   11/21/15 1215  piperacillin-tazobactam (ZOSYN) IVPB 3.375 g     3.375 g 100 mL/hr over 30 Minutes Intravenous  Once 11/21/15 1210 11/21/15 1346      Scheduled Meds: . amiodarone  100 mg Oral BID  . aspirin EC  81 mg Oral Daily  . carbidopa-levodopa  1 tablet Oral QID  . colchicine  0.6 mg Oral Daily  . cyanocobalamin  1,000 mcg Intramuscular Q30 days  . enoxaparin (LOVENOX) injection  40 mg Subcutaneous Q24H  . feeding supplement (GLUCERNA SHAKE)  237 mL Oral TID BM  . furosemide  40 mg Oral BID  . [START ON 11/22/2015] levothyroxine  150 mcg Oral QAC breakfast  . piperacillin-tazobactam (ZOSYN)  IV  4.5 g Intravenous Q8H  . potassium chloride  20 mEq Oral BID  . pramipexole  0.5 mg Oral TID  . tamsulosin  0.4 mg Oral QPC supper  No Known Allergies  Physical Exam  Vitals  Blood pressure 152/70, pulse 62, temperature  94.9 F (34.9 C), temperature source Oral, resp. rate 18, height 6' (1.829 m), weight 82.555 kg (182 lb), SpO2 94 %.  Lower Extremity exam:  Vascular:Difficult to palpate DP and PT pulses area patient has had a an angioplasty and some vascular reconstruction in March of this year. Previous surgery 3 weeks ago showed good bleeding to the left foot at time of surgery.  Dermatological: Patient has an incision along the medial left foot that is representative of the area where we did a first ray amputation. That incision margin solid and healing well is likely completely consolidated at this timeframe. The left heel has an eschar area that it represents a region approximately 9 x 4 cm in diameter. This has necrosis superficially is difficult to tell how deep it is this point. The right heel has a superficial blister that is drying and healing at this point without significant eschar.  Neurological: Patient does have peripheral neuropathy bilaterally  Ortho: First ray amputation left foot healing well. Has a fixed flexion contracture of the left knee which also increases the risk of pressure on his heel when he is in the bed. We tried to address this last time he was in the Hospital by keeping his feet propped up on pillows so that there was no pressure and also he was to use the heel protector boots on a regular basis. Uncertain as to how successful this is followed at his facility.  Data Review  CBC  Recent Labs Lab 11/21/15 1236  WBC 9.7  HGB 11.8*  HCT 35.6*  PLT 420  MCV 90.2  MCH 29.9  MCHC 33.1  RDW 15.7*  LYMPHSABS 1.1  MONOABS 0.8  EOSABS 0.2  BASOSABS 0.1   ------------------------------------------------------------------------------------------------------------------  Chemistries   Recent Labs  Lab 11/21/15 1236  NA 138  K 3.2*  CL 102  CO2 27  GLUCOSE 242*  BUN 21*  CREATININE 1.12  CALCIUM 8.9    ----------- ---------------------------------------------------------------------------------------------------------------   Assessment & Plan: Decubitus ulceration to the left heel. To keep his blister to the right heel without appears to be stable. Consolidated basically healed first ray amputation site. Plan: We will ask vascular to take a look at him and see if a feel like it's worth trying another angioplasty to promote more blood flow to the heel region. I will likely take his stitches out later this week on his first ray amputation site. He'll need further debridement to the heel ulceration with a wound VAC if these to get chance for healing to the region. I will see what vascular findings are and speak with vascular doctor tomorrow.  Active Problems:   Heel ulcer (HCC)     Family Communication: Plan discussed with patient and family   Thank you for the consult, we will follow the patient with you in the Hospital.   Epimenio Sarin M.D on 11/21/2015 at 5:39 PM  Thank you for the consult, we will follow the patient with you in the Hospital.

## 2015-11-22 ENCOUNTER — Encounter: Payer: Self-pay | Admitting: Podiatry

## 2015-11-22 LAB — BASIC METABOLIC PANEL
ANION GAP: 8 (ref 5–15)
BUN: 20 mg/dL (ref 6–20)
CALCIUM: 8.6 mg/dL — AB (ref 8.9–10.3)
CO2: 28 mmol/L (ref 22–32)
CREATININE: 1.18 mg/dL (ref 0.61–1.24)
Chloride: 104 mmol/L (ref 101–111)
GFR calc Af Amer: >60 mL/min (ref >60)
GFR calc non Af Amer: 53 mL/min — ABNORMAL LOW (ref >60)
GLUCOSE: 195 mg/dL — AB (ref 65–99)
POTASSIUM: 3.6 mmol/L (ref 3.5–5.1)
SODIUM: 140 mmol/L (ref 135–145)

## 2015-11-22 LAB — HEMOGLOBIN A1C: Hgb A1c MFr Bld: 7.5 % — ABNORMAL HIGH (ref 4.0–6.0)

## 2015-11-22 LAB — CBC
HCT: 35.2 % — ABNORMAL LOW (ref 40.0–52.0)
Hemoglobin: 11.7 g/dL — ABNORMAL LOW (ref 13.0–18.0)
MCH: 30.3 pg (ref 26.0–34.0)
MCHC: 33.4 g/dL (ref 32.0–36.0)
MCV: 90.8 fL (ref 80.0–100.0)
PLATELETS: 411 10*3/uL (ref 150–440)
RBC: 3.87 MIL/uL — AB (ref 4.40–5.90)
RDW: 15.4 % — ABNORMAL HIGH (ref 11.5–14.5)
WBC: 9.1 10*3/uL (ref 3.8–10.6)

## 2015-11-22 LAB — GLUCOSE, CAPILLARY
GLUCOSE-CAPILLARY: 266 mg/dL — AB (ref 65–99)
GLUCOSE-CAPILLARY: 213 mg/dL — AB (ref 65–99)
GLUCOSE-CAPILLARY: 208 mg/dL — AB (ref 65–99)
Glucose-Capillary: 213 mg/dL — ABNORMAL HIGH (ref 65–99)

## 2015-11-22 LAB — VANCOMYCIN, TROUGH: Vancomycin Tr: 27 ug/mL (ref 10–20)

## 2015-11-22 MED ORDER — INSULIN ASPART 100 UNIT/ML ~~LOC~~ SOLN
0.0000 [IU] | Freq: Every day | SUBCUTANEOUS | Status: DC
  Administered 2015-11-22 – 2015-11-26 (×2): 2 [IU] via SUBCUTANEOUS
  Filled 2015-11-22 (×2): qty 2

## 2015-11-22 MED ORDER — FUROSEMIDE 40 MG PO TABS
40.0000 mg | ORAL_TABLET | Freq: Every day | ORAL | Status: DC
  Administered 2015-11-23 – 2015-11-28 (×4): 40 mg via ORAL
  Filled 2015-11-22 (×5): qty 1

## 2015-11-22 MED ORDER — INSULIN ASPART 100 UNIT/ML ~~LOC~~ SOLN
0.0000 [IU] | Freq: Three times a day (TID) | SUBCUTANEOUS | Status: DC
  Administered 2015-11-22 (×2): 3 [IU] via SUBCUTANEOUS
  Administered 2015-11-22: 5 [IU] via SUBCUTANEOUS
  Administered 2015-11-24: 1 [IU] via SUBCUTANEOUS
  Administered 2015-11-26: 2 [IU] via SUBCUTANEOUS
  Administered 2015-11-26: 3 [IU] via SUBCUTANEOUS
  Administered 2015-11-26: 2 [IU] via SUBCUTANEOUS
  Administered 2015-11-27: 1 [IU] via SUBCUTANEOUS
  Administered 2015-11-27: 2 [IU] via SUBCUTANEOUS
  Administered 2015-11-28: 3 [IU] via SUBCUTANEOUS
  Filled 2015-11-22: qty 2
  Filled 2015-11-22: qty 1
  Filled 2015-11-22: qty 5
  Filled 2015-11-22 (×3): qty 2
  Filled 2015-11-22: qty 1
  Filled 2015-11-22 (×3): qty 3

## 2015-11-22 MED ORDER — LINAGLIPTIN 5 MG PO TABS
5.0000 mg | ORAL_TABLET | Freq: Every day | ORAL | Status: DC
  Administered 2015-11-22 – 2015-11-28 (×4): 5 mg via ORAL
  Filled 2015-11-22 (×5): qty 1

## 2015-11-22 MED ORDER — TERBINAFINE HCL 1 % EX CREA
TOPICAL_CREAM | Freq: Two times a day (BID) | CUTANEOUS | Status: DC
  Administered 2015-11-22 – 2015-11-28 (×10): via TOPICAL
  Filled 2015-11-22: qty 12

## 2015-11-22 MED ORDER — PIPERACILLIN-TAZOBACTAM 3.375 G IVPB
3.3750 g | Freq: Three times a day (TID) | INTRAVENOUS | Status: DC
  Administered 2015-11-22 – 2015-11-27 (×14): 3.375 g via INTRAVENOUS
  Filled 2015-11-22 (×20): qty 50

## 2015-11-22 MED ORDER — SODIUM CHLORIDE 0.9 % IV SOLN
INTRAVENOUS | Status: DC
  Administered 2015-11-24 – 2015-11-26 (×5): via INTRAVENOUS

## 2015-11-22 MED ORDER — CYANOCOBALAMIN 1000 MCG/ML IJ SOLN
1000.0000 ug | INTRAMUSCULAR | Status: DC
  Administered 2015-11-23: 1000 ug via INTRAMUSCULAR
  Filled 2015-11-22: qty 1

## 2015-11-22 NOTE — Clinical Social Work Note (Addendum)
Clinical Social Work Assessment  Patient Details  Name: [name] MRN: [id number] Date of Birth: [birth year]  Date of referral:  11/22/15               Reason for consult:  Facility Placement                Permission sought to share information with:  Chartered certified accountant granted to share information::  Yes, Verbal Permission Granted  Name::      [place]::   [place]   Relationship::     Contact Information:     Housing/Transportation Living arrangements for the past 2 months:  [place], [place] of Information:  Patient, Adult Children, Friend/Neighbor Patient Interpreter Needed:  None Criminal Activity/Legal Involvement Pertinent to Current Situation/Hospitalization:  No - Comment as needed Significant Relationships:  Adult Children Lives with:  Adult Children Do you feel safe going back to the place where you live?  Yes Need for family participation in patient care:  Yes (Comment)  Care giving concerns:  Patient was at [facility] for short term rehab and discharged from [facility] to [facility] on 11/09/15.    Social Worker assessment / plan:  Holiday representative (CSW) received consult that patient is from [facility]. Per [facility] admissions coordinator at [facility] they can accept patient back however the family is not happy with [facility]. Per RN in progression rounds the family and patient do not want to return to [facility]. CSW met with patient and her daughter [name] [phone] was at bedside. CSW introduced self and explained role of CSW department. Patient reported that he lives with his son [name] in [place] and went to [facility] for [employer]. Per patient he would like to go to a different facility because he is not happy with [facility]. Patient and daughter gave CSW permission to send out SNF search. CSW explained that patient's Health Team insurance  will have to approve SNF again. Per daughter she will pay the co-pays for IV Abx. CSW sent [employer] Team case manager a message making her aware of above.   CSW provided bed offers to patient and they chose [facility] Kim admissions coordinator at [facility] is aware of accepted bed offer. Per patient if he is able to return home from [facility] he would like to but he is agreeable to [facility]. CSW will continue to follow and assist as needed.   Employment status:  Retired Nurse, adult PT Recommendations:  Not assessed at this time Information / Referral to community resources:  [place]  Patient/Family's Response to care:  Patient and daughter are agreeable for patient to go to [place].   Patient/Family's Understanding of and Emotional Response to Diagnosis, Current Treatment, and Prognosis:  Patient and daughter were pleasant and thanked CSW for visit.   Emotional Assessment Appearance:  Appears stated age Attitude/Demeanor/Rapport:    Affect (typically observed):  Accepting, Adaptable, Pleasant Orientation:  Oriented to Self, Oriented to Place, Oriented to  Time, Oriented to Situation Alcohol / Substance use:  Not Applicable Psych involvement (Current and /or in the community):  No (Comment)  Discharge Needs  Concerns to be addressed:  Discharge Planning Concerns Readmission within the last 30 days:  No Current discharge risk:  Dependent with Mobility Barriers to Discharge:  Continued Medical Work up   Loralyn Freshwater, LCSW 11/22/2015, 12:04 PM

## 2015-11-22 NOTE — Progress Notes (Signed)
   11/22/15 1415  Clinical Encounter Type  Visited With Patient and family together  Visit Type Follow-up  Referral From Nurse  Consult/Referral To Chaplain  Spiritual Encounters  Spiritual Needs Other (Comment) (Pastoral counsel)  Stress Factors  Patient Stress Factors Health changes  Family Stress Factors Not reviewed  Visited patient who had returned after discharge from hospital. Patient is scheduled for surgery tomorrow. Provided compassionate presence to allow patient to reflect on his previous surgery and find hope in further treatment. Patient appeared resolved to move forward with surgery and subsequent therapy. I will visit prior to surgery tomorrow to provide prayer and encouragement. Chap. Rodgerick Gilliand G. Nithin Demeo, ext. [phone]

## 2015-11-22 NOTE — Progress Notes (Addendum)
ANTIBIOTIC CONSULT NOTE - Follow up  Pharmacy Consult for Vancomycin/Zosyn  Indication: wound infection  No Known Allergies  Patient Measurements: Height: 6' (182.9 cm) Weight: 182 lb (82.555 kg) IBW/kg (Calculated) : 77.6    Vital Signs: Temp: 96.8 F (36 C) (06/20 0758) Temp Source: Oral (06/20 0758) BP: 118/58 mmHg (06/20 0758) Pulse Rate: 78 (06/20 0758) Intake/Output from previous day: 06/19 0701 - 06/20 0700 In: 240 [P.O.:240] Out: 470 [Urine:470] Intake/Output from this shift: Total I/O In: 360 [P.O.:360] Out: 150 [Urine:150]  Labs:  Recent Labs  11/21/15 1236 11/22/15 0340  WBC 9.7 9.1  HGB 11.8* 11.7*  PLT 420 411  CREATININE 1.12 1.18   Estimated Creatinine Clearance: 47.5 mL/min (by C-G formula based on Cr of 1.18).  Recent Labs  11/21/15 1813  VANCORANDOM 30     Microbiology: Recent Results (from the past 720 hour(s))  WOUND CULTURE ([facility] ONLY)     Status: None   Collection Time: 11/01/15  1:20 PM  Result Value Ref Range Status   Specimen Description HEEL  Final   Special Requests LEFT  Final   Gram Stain RARE WBC SEEN NO ORGANISMS SEEN   Final   Culture   Final    LIGHT GROWTH PROTEUS MIRABILIS LIGHT GROWTH METHICILLIN RESISTANT STAPHYLOCOCCUS AUREUS MODERATE GROWTH ENTEROCOCCUS FAECALIS    Report Status 11/09/2015 FINAL  Final   Organism ID, Bacteria PROTEUS MIRABILIS  Final   Organism ID, Bacteria METHICILLIN RESISTANT STAPHYLOCOCCUS AUREUS  Final   Organism ID, Bacteria ENTEROCOCCUS FAECALIS  Final      Susceptibility   Proteus mirabilis - MIC*    AMPICILLIN <=2 SENSITIVE Sensitive     CEFAZOLIN <=4 SENSITIVE Sensitive     CEFEPIME <=1 SENSITIVE Sensitive     CEFTAZIDIME <=1 SENSITIVE Sensitive     CEFTRIAXONE <=1 SENSITIVE Sensitive     CIPROFLOXACIN <=0.25 SENSITIVE Sensitive     GENTAMICIN <=1 SENSITIVE Sensitive     IMIPENEM 2 SENSITIVE Sensitive     TRIMETH/SULFA <=20 SENSITIVE Sensitive     AMPICILLIN/SULBACTAM <=2  SENSITIVE Sensitive     PIP/TAZO <=4 SENSITIVE Sensitive     * LIGHT GROWTH PROTEUS MIRABILIS   Enterococcus faecalis - MIC*    AMPICILLIN SENSITIVE Sensitive     LINEZOLID SENSITIVE Sensitive     * MODERATE GROWTH ENTEROCOCCUS FAECALIS   Methicillin resistant staphylococcus aureus - MIC*    CIPROFLOXACIN >=8 RESISTANT Resistant     ERYTHROMYCIN >=8 RESISTANT Resistant     GENTAMICIN <=0.5 SENSITIVE Sensitive     OXACILLIN >=4 RESISTANT Resistant     TETRACYCLINE 2 SENSITIVE Sensitive     VANCOMYCIN 1 SENSITIVE Sensitive     TRIMETH/SULFA <=10 SENSITIVE Sensitive     CLINDAMYCIN >=8 RESISTANT Resistant     RIFAMPIN <=0.5 SENSITIVE Sensitive     Inducible Clindamycin NEGATIVE Sensitive     * LIGHT GROWTH METHICILLIN RESISTANT STAPHYLOCOCCUS AUREUS  Surgical pcr screen     Status: Abnormal   Collection Time: 11/04/15  6:11 PM  Result Value Ref Range Status   MRSA, PCR POSITIVE (A) NEGATIVE Final    Comment: CRITICAL RESULT CALLED TO, READ BACK BY AND VERIFIED WITH: MARY RAYMOND AT 2324 11/04/15.PMH    Staphylococcus aureus POSITIVE (A) NEGATIVE Final    Comment:        The Xpert SA Assay (FDA approved for NASAL specimens in patients over 30 years of age), is one component of a comprehensive surveillance program.  Test performance has been validated  by [facility] for patients greater than or equal to 80 year old. It is not intended to diagnose infection nor to guide or monitor treatment.   Anaerobic culture     Status: None   Collection Time: 11/05/15 12:17 PM  Result Value Ref Range Status   Specimen Description WOUND  Final   Special Requests NONE  Final   Culture NO ANAEROBES ISOLATED  Final   Report Status 11/11/2015 FINAL  Final  WOUND CULTURE ([facility] ONLY)     Status: None   Collection Time: 11/05/15 12:17 PM  Result Value Ref Range Status   Specimen Description WOUND  Final   Special Requests NONE  Final   Gram Stain   Final    FEW RED BLOOD CELLS RARE WBC  SEEN NO ORGANISMS SEEN    Culture   Final    LIGHT GROWTH PROTEUS MIRABILIS LIGHT GROWTH STAPHYLOCOCCUS AUREUS    Report Status 11/08/2015 FINAL  Final   Organism ID, Bacteria PROTEUS MIRABILIS  Final   Organism ID, Bacteria PROTEUS MIRABILIS  Final   Organism ID, Bacteria STAPHYLOCOCCUS AUREUS  Final   Organism ID, Bacteria STAPHYLOCOCCUS AUREUS  Final      Susceptibility   Staphylococcus aureus - MIC*    CEFAZOLIN RESISTANT Resistant     LEVOFLOXACIN RESISTANT Resistant     OXACILLIN RESISTANT Resistant     TRIMETH/SULFA SENSITIVE Sensitive     VANCOMYCIN SENSITIVE Sensitive     TETRACYCLINE SENSITIVE Sensitive    Staphylococcus aureus - MIC*    CIPROFLOXACIN >=8 RESISTANT Resistant     ERYTHROMYCIN >=8 RESISTANT Resistant     GENTAMICIN <=0.5 SENSITIVE Sensitive     OXACILLIN >=4 RESISTANT Resistant     TETRACYCLINE <=1 SENSITIVE Sensitive     VANCOMYCIN 1 SENSITIVE Sensitive     TRIMETH/SULFA <=10 SENSITIVE Sensitive     CLINDAMYCIN >=8 RESISTANT Resistant     RIFAMPIN <=0.5 SENSITIVE Sensitive     Inducible Clindamycin NEGATIVE Sensitive     * LIGHT GROWTH STAPHYLOCOCCUS AUREUS    LIGHT GROWTH STAPHYLOCOCCUS AUREUS   Proteus mirabilis - MIC*    AMPICILLIN RESISTANT Resistant     AMPICILLIN/SULBACTAM RESISTANT Resistant     CEFAZOLIN RESISTANT Resistant     CIPROFLOXACIN SENSITIVE Sensitive     GENTAMICIN SENSITIVE Sensitive     IMIPENEM SENSITIVE Sensitive     TRIMETH/SULFA SENSITIVE Sensitive    Proteus mirabilis - MIC*    AMPICILLIN >=32 RESISTANT Resistant     CEFAZOLIN >=64 RESISTANT Resistant     CEFEPIME <=1 RESISTANT Resistant     CEFTAZIDIME 16 RESISTANT Resistant     CEFTRIAXONE 8 RESISTANT Resistant     CIPROFLOXACIN <=0.25 SENSITIVE Sensitive     GENTAMICIN <=1 SENSITIVE Sensitive     IMIPENEM 1 SENSITIVE Sensitive     TRIMETH/SULFA <=20 SENSITIVE Sensitive     AMPICILLIN/SULBACTAM >=32 RESISTANT Resistant     PIP/TAZO <=4 SENSITIVE Sensitive     *  LIGHT GROWTH PROTEUS MIRABILIS    LIGHT GROWTH PROTEUS MIRABILIS  Blood culture (routine x 2)     Status: None (Preliminary result)   Collection Time: 11/21/15 12:37 PM  Result Value Ref Range Status   Specimen Description BLOOD LEFT HAND  Final   Special Requests   Final    BOTTLES DRAWN AEROBIC AND ANAEROBIC  AERO 10CC ANA 7CC   Culture NO GROWTH < 24 HOURS  Final   Report Status PENDING  Incomplete  Blood culture (routine x 2)  Status: None (Preliminary result)   Collection Time: 11/21/15 12:37 PM  Result Value Ref Range Status   Specimen Description BLOOD RIGHT FATTY CASTS  Final   Special Requests BOTTLES DRAWN AEROBIC AND ANAEROBIC  1CC  Final   Culture NO GROWTH < 24 HOURS  Final   Report Status PENDING  Incomplete    Assessment: Pharmacy consulted to dose vanc and zosyn in this 82 year old male admitted from [facility] with infected heel ulcer.  Pt was being treated at [facility] with Vanc and Zosyn but pt's condition continued to worsen.   Will draw random Vanc level on 6/19 @ 16:00.   CrCl = 51.3 ml/min Ke = 0.047 hr-1 T1/2 = 14.7 hrs VD = 57.8 L   Pt received Vanc 1 g IV X 1 in ED on 6/19 @ 12:42. 6/19 :  Random Vanc level @ 1813 = 30 mcg/mL    Goal of Therapy:  Vancomycin trough level 15-20 mcg/ml  Plan:  Current order for Zosyn 4.5 gm IV Q8H EI ordered to start on 6/19 @ 20:00. Will transition to Zosyn 3.375 g IV q8h EI as continuation of regimen pt was on at [facility].   Vanc level on 6/19 @ 18:00 was 30 mcg/mL. Therefore, will wait 12 hrs ( approximately 1 half-life) before resuming vancomycin.  Vancomycin 1 gm IV Q12H ordered to start 6/20 @ 6:00.  6/20: Will get a vanc level before dose this evening to see where we are and to ensure that pt is clearing doses. This level will not be at steady state. If level <20, likely continue vanc 1 g IV q12h and recheck level after a couple more doses.   Pharmacy will continue to follow.   Marty Heck 11/22/2015,10:33 AM   Addendum: [facility] and spoke with April, RPh [phone]): - vancomycin 750 mg q12h from 6/8 to 6/12 - Vanc trough 6/12 of 11.5, dose increased to vanc 1g IV q12 - Vanc trough 6/15 of 15.8 (Scr 1.18) before 3rd dose of vanc 1 g IV q12h - Pt got both doses (0900 and 2100) on Sun 6/18 but does not appear to have gotten 6/19 0900 dose before leaving facility and coming [facility]

## 2015-11-22 NOTE — Care Management Note (Signed)
Case Management Note  Patient Details  Name: [name] MRN: [id number] Date of Birth: 1924/11/19  Subjective/Objective:                   Met with patient with patient and his daughter to discuss discharge planning. Daughter wants patient to go to [facility]; did not like [facility]. She said if we can't get [facility] then patient will go home with [facility] and hospital bed- patient wants this too. He has PICC line for IV antibiotic, VAC pending. Wheelchair dependent per daughter. They are familiar with [facility] for private duty also. He has a long-term care policy.  Action/Plan: List of home health agencies left with patient. Referral to [facility]. Hospital bed also [facility] from [facility]. Patient has multiple wounds (heel and sacrum) which requires upper and lower body to be positioned in ways not feasible with a normal bed. Head must be elevated at least 30 degrees or more. Patient is non-ambulatory and requires frequent and immediate changes in body position which cannot be achieved with a normal bed. Gel overlay would be beneficial. VAC form received and have podiatry complete for [facility].   Expected Discharge Date:  11/24/15               Expected Discharge Plan:     In-House Referral:     Discharge planning Services  CM Consult  Post Acute Care Choice:  Home Health, Durable Medical Equipment Choice offered to:  Patient, Adult Children  DME Arranged:  [facility] bed DME Agency:     HH Arranged:    [facility] Agency:  [facility]  Status of Service:  In process, will continue to follow  If discussed at Long Length of Stay Meetings, dates discussed:    Additional Comments:  Marshell Garfinkel, RN 11/22/2015, 12:13 PM

## 2015-11-22 NOTE — Progress Notes (Signed)
[facility] - [place] at [facility]   PATIENT NAME: [name]    MR#:  [id number]  DATE OF BIRTH:  01-14-1925  SUBJECTIVE:  CHIEF COMPLAINT:   Chief Complaint  Patient presents with  . Foot Injury   - admitted with left heel ulcer- recent left first ray amputation - for angiogram tomorrow - Daughter at bedside  REVIEW OF SYSTEMS:  Review of Systems  Constitutional: Positive for fever and malaise/fatigue. Negative for chills.  HENT: Positive for hearing loss. Negative for ear discharge, ear pain and nosebleeds.   Eyes: Negative for blurred vision and double vision.  Respiratory: Negative for cough, shortness of breath and wheezing.   Cardiovascular: Negative for chest pain, palpitations and leg swelling.  Gastrointestinal: Negative for nausea, vomiting, abdominal pain, diarrhea and constipation.  Genitourinary: Negative for dysuria and hematuria.  Musculoskeletal: Positive for myalgias.  Neurological: Negative for dizziness, speech change, focal weakness, seizures and headaches.  Psychiatric/Behavioral: Negative for depression.    DRUG ALLERGIES:  No Known Allergies  VITALS:  Blood pressure 119/66, pulse 73, temperature 96.6 F (35.9 C), temperature source Oral, resp. rate 18, height 6' (1.829 m), weight 82.555 kg (182 lb), SpO2 98 %.  PHYSICAL EXAMINATION:  Physical Exam  GENERAL:  82 y.o.-year-old elderly patient lying in the bed with no acute distress.  EYES: Pupils equal, round, reactive to light and accommodation. No scleral icterus. Extraocular muscles intact.  HEENT: Head atraumatic, normocephalic. Oropharynx and nasopharynx clear.  NECK:  Supple, no jugular venous distention. No thyroid enlargement, no tenderness.  LUNGS: Normal breath sounds bilaterally, no wheezing, rales,rhonchi or crepitation. No use of accessory muscles of respiration. Decreased bibasilar breath sounds CARDIOVASCULAR: S1, S2 normal. No rubs, or gallops. 3/6  systolic murmur present ABDOMEN: Soft, nontender, nondistended. Bowel sounds present. No organomegaly or mass.  EXTREMITIES: Left foot in dressing, both in heel floats, likely fungal nail infection of hand nails NEUROLOGIC: Cranial nerves II through XII are intact. Muscle strength 5/5 in all extremities. Sensation intact. Gait not checked.  PSYCHIATRIC: The patient is alert and oriented x 3.  SKIN: No obvious rash, lesion, or ulcer.    LABORATORY PANEL:   CBC  Recent Labs Lab 11/22/15 0340  WBC 9.1  HGB 11.7*  HCT 35.2*  PLT 411   ------------------------------------------------------------------------------------------------------------------  Chemistries   Recent Labs Lab 11/22/15 0340  NA 140  K 3.6  CL 104  CO2 28  GLUCOSE 195*  BUN 20  CREATININE 1.18  CALCIUM 8.6*   ------------------------------------------------------------------------------------------------------------------  Cardiac Enzymes No results for input(s): TROPONINI in the last 168 hours. ------------------------------------------------------------------------------------------------------------------  RADIOLOGY:  No results found.  EKG:   Orders placed or performed in visit on 10/28/15  . EKG 12-Lead    ASSESSMENT AND PLAN:   82 year old male with past medical history of peripheral vascular disease, coronary artery disease, diabetes type 2 without, patient, history of aortic stenosis, hypertension, hyperlipidemia, history of previous TIA, hypothyroidism admitted from [facility] with left heel ulcer  1. Left heel ulcer with recent left first ray amputation -Appreciate podiatry and vascular consults. -For angiogram tomorrow. Followed by him I need debridement of that left heel ulcer. - Continue empiric antibiotics with vancomycin and Zosyn  2. History of Parkinson's disease-continue Sinemet.  3. Atrial fibrillation-on amiodarone. Continue low-dose aspirin. Rate  controlled  4. Hypothyroidism-continue Synthroid.  5. Chronic venous insufficiency-patient is on Lasix at home. Decreased the dose. Leg wrappings, keep the legs elevated if needed. Last echo from February 2016  with normal ejection fraction.  6. Restless leg syndrome-continue Mirapex.  7. BPH-continue Flomax.  8. Hypothyroidism-continue Synthroid.   Physical therapy consulted   All the records are reviewed and case discussed with Care Management/Social Workerr. Management plans discussed with the patient, family and they are in agreement.  CODE STATUS: DNR  TOTAL TIME TAKING CARE OF THIS PATIENT: 33 minutes.   POSSIBLE D/C IN 2 DAYS, DEPENDING ON CLINICAL CONDITION.   Breyon Blass M.D on 11/22/2015 at 4:09 PM  Between 7am to 6pm - Pager - [phone]  After 6pm go to [email or web address] - password [email or web address]  [facility]  Office  [phone]  CC: Primary care physician; Tonette LedererKIMBERLY A STEGMAYER, PA-C

## 2015-11-22 NOTE — [place] FL2 (Signed)
[place] MEDICAID FL2 LEVEL OF CARE SCREENING TOOL     IDENTIFICATION  Patient Name: [name] Birthdate: [birth year] Sex: male Admission Date (Current Location): 11/21/2015  [place]   Facility and Address:  [facility], [address], [place], [place] [address]      Provider Number: [id number]  Attending Physician Name and Address:  Enid Baasadhika Kalisetti, MD  Relative Name and Phone Number:       Current Level of Care: Hospital Recommended Level of Care: Skilled Nursing Facility Prior Approval Number:    Date Approved/Denied:   PASRR Number:  ( [id number] )  Discharge Plan: SNF    Current Diagnoses: Patient Active Problem List   Diagnosis Date Noted  . Heel ulcer (HCC) 11/21/2015  . Diabetic foot (HCC) 11/04/2015  . PAD (peripheral artery disease) (HCC) 10/28/2015  . Diabetes mellitus without complication (HCC) 07/11/2015  . Clavicle enlargement 07/11/2015  . Poorly controlled type 2 diabetes mellitus (HCC) 06/23/2014  . Bed sore on heel, right, unstageable (HCC) 09/23/2013  . Coronary atherosclerosis of native coronary artery 03/27/2013  . Chronic diastolic CHF (congestive heart failure) (HCC) 03/27/2013  . Aortic valve stenosis 03/27/2013  . Essential hypertension 03/27/2013  . Hyperlipidemia 03/27/2013  . Atrial fibrillation (HCC) 03/27/2013  . Bilateral leg edema 03/27/2013  . Constipation 03/27/2013    Orientation RESPIRATION BLADDER Height & Weight     Self, Time, Situation, Place  Normal Continent Weight: 182 lb (82.555 kg) Height:  6' (182.9 cm)  BEHAVIORAL SYMPTOMS/MOOD NEUROLOGICAL BOWEL NUTRITION STATUS   (none )  (none ) Continent Diet (Diet: Heart Healthy/ Carb Modified )  AMBULATORY STATUS COMMUNICATION OF NEEDS Skin   Extensive Assist Verbally PU Stage and Appropriate Care (Pressure Ulcer Unstageable: Left heel. Pressure Ulcer Stage 2: Buttocks. )                        Personal Care Assistance Level of Assistance  Bathing, Feeding, Dressing Bathing Assistance: Limited assistance Feeding assistance: Independent Dressing Assistance: Limited assistance     Functional Limitations Info  Sight, Hearing, Speech Sight Info: Adequate Hearing Info: Adequate Speech Info: Adequate    SPECIAL CARE FACTORS FREQUENCY  PT (By licensed PT), OT (By licensed OT) (IV Abx )     PT Frequency:  (5) OT Frequency:  (5)            Contractures      Additional Factors Info  Code Status, Allergies, Insulin Sliding Scale, Isolation Precautions Code Status Info:  (DNR ) Allergies Info:  (No Known Allergies )   Insulin Sliding Scale Info:  (NovoLog Insulin Injections 3 times daily ) Isolation Precautions Info:  (MRSA nasal swab )     Current Medications (11/22/2015):  This is the current hospital active medication list Current Facility-Administered Medications  Medication Dose Route Frequency Provider Last Rate Last Dose  . acetaminophen (TYLENOL) tablet 650 mg  650 mg Oral Q6H PRN Houston SirenVivek J Sainani, MD       Or  . acetaminophen (TYLENOL) suppository 650 mg  650 mg Rectal Q6H PRN Houston SirenVivek J Sainani, MD      . amiodarone (PACERONE) tablet 100 mg  100 mg Oral BID Houston SirenVivek J Sainani, MD   100 mg at 11/21/15 2322  . aspirin EC tablet 81 mg  81 mg Oral Daily Houston SirenVivek J Sainani, MD   81 mg at 11/21/15 1723  . carbidopa-levodopa (SINEMET IR) 25-100 MG per tablet  immediate release 1 tablet  1 tablet Oral QID Houston Siren, MD   1 tablet at 11/21/15 2322  . colchicine tablet 0.6 mg  0.6 mg Oral Daily Houston Siren, MD   0.6 mg at 11/21/15 1736  . cyanocobalamin ((VITAMIN B-12)) injection 1,000 mcg  1,000 mcg Intramuscular Q30 days Houston Siren, MD   1,000 mcg at 11/21/15 1723  . enoxaparin (LOVENOX) injection 40 mg  40 mg Subcutaneous Q24H Houston Siren, MD   40 mg at 11/21/15 1737  . feeding supplement (GLUCERNA SHAKE) (GLUCERNA SHAKE) liquid 237 mL  237 mL Oral TID BM  Houston Siren, MD   237 mL at 11/21/15 2000  . furosemide (LASIX) tablet 40 mg  40 mg Oral BID Houston Siren, MD   40 mg at 11/21/15 1723  . insulin aspart (novoLOG) injection 0-5 Units  0-5 Units Subcutaneous QHS Enid Baas, MD      . insulin aspart (novoLOG) injection 0-9 Units  0-9 Units Subcutaneous TID WC Enid Baas, MD      . levothyroxine (SYNTHROID, LEVOTHROID) tablet 150 mcg  150 mcg Oral QAC breakfast Houston Siren, MD      . linagliptin (TRADJENTA) tablet 5 mg  5 mg Oral Daily Enid Baas, MD      . ondansetron (ZOFRAN) tablet 4 mg  4 mg Oral Q6H PRN Houston Siren, MD       Or  . ondansetron (ZOFRAN) injection 4 mg  4 mg Intravenous Q6H PRN Houston Siren, MD      . oxyCODONE-acetaminophen (PERCOCET/ROXICET) 5-325 MG per tablet 1 tablet  1 tablet Oral Q4H PRN Houston Siren, MD      . piperacillin-tazobactam (ZOSYN) IVPB 4.5 g  4.5 g Intravenous Q8H Houston Siren, MD   4.5 g at 11/22/15 0659  . potassium chloride SA (K-DUR,KLOR-CON) CR tablet 20 mEq  20 mEq Oral BID Houston Siren, MD   20 mEq at 11/21/15 2322  . pramipexole (MIRAPEX) tablet 0.5 mg  0.5 mg Oral TID Houston Siren, MD   0.5 mg at 11/21/15 2327  . tamsulosin (FLOMAX) capsule 0.4 mg  0.4 mg Oral QPC supper Houston Siren, MD   0.4 mg at 11/21/15 1724  . vancomycin (VANCOCIN) IVPB 1000 mg/200 mL premix  1,000 mg Intravenous Q12H Houston Siren, MD   1,000 mg at 11/22/15 9604     Discharge Medications: Please see discharge summary for a list of discharge medications.  Relevant Imaging Results:  Relevant Lab Results:   Additional Information  (SSN: [id number])  Haig Prophet, LCSW

## 2015-11-22 NOTE — Clinical Social Work Placement (Signed)
   CLINICAL SOCIAL WORK PLACEMENT  NOTE  Date:  11/22/2015  Patient Details  Name: Henry Russellmer K Langan MRN: [id number] Date of Birth: 1925-04-26  Clinical Social Work is seeking post-discharge placement for this patient at the Skilled  Nursing Facility level of care (*CSW will initial, date and re-position this form in  chart as items are completed):  Yes   Patient/family provided with [facility] Clinical Social Work Department's list of facilities offering this level of care within the geographic area requested by the patient (or if unable, by the patient's family).  Yes   Patient/family informed of their freedom to choose among providers that offer the needed level of care, that participate in Medicare, Medicaid or managed care program needed by the patient, have an available bed and are willing to accept the patient.  Yes   Patient/family informed of [facility]'s ownership interest in [facility] and [facility], as well as of the fact that they are under no obligation to receive care at these facilities.  PASRR submitted to EDS on       PASRR number received on       Existing PASRR number confirmed on 11/22/15     FL2 transmitted to all facilities in geographic area requested by pt/family on 11/22/15     FL2 transmitted to all facilities within larger geographic area on       Patient informed that his/her managed care company has contracts with or will negotiate with certain facilities, including the following:        Yes   Patient/family informed of bed offers received.  Patient chooses bed at  [facility] )     Physician recommends and patient chooses bed at      Patient to be transferred to   on  .  Patient to be transferred to facility by       Patient family notified on   of transfer.  Name of family member notified:        PHYSICIAN       Additional Comment:    _______________________________________________ Haig ProphetMorgan, Nisaiah Bechtol G, LCSW 11/22/2015,  12:01 PM

## 2015-11-22 NOTE — Progress Notes (Signed)
\                                                                                     Patient Demographics  [name], is a 82 y.o. male   MRN: [id number]   DOB - 1925-04-12  Admit Date - 11/21/2015    Outpatient Primary MD for the patient is Tonette LedererKIMBERLY A STEGMAYER, PA-C  With History of -  Past Medical History  Diagnosis Date  . Coronary artery disease   . Diabetes mellitus without complication (HCC)   . Aortic stenosis   . Hypertension   . Hyperlipidemia   . TIA (transient ischemic attack)   . Thyroid disease   . Hypothyroidism       Past Surgical History  Procedure Laterality Date  . Appendectomy    . Hernia repair    . Knee surgery      bilateral  . Eye surgery    . Colonoscopy    . Cardiac catheterization  2010    showed a patient circumflex stent with noncritical CAD and normal right-sided pressures.  . Coronary angioplasty  01/24/2007    stent placement to the mid circumflex   . Knee surgery Left   . Peripheral vascular catheterization N/A 08/30/2015    Procedure: Abdominal Aortogram w/Lower Extremity;  Surgeon: Renford DillsGregory G Schnier, MD;  Location: [facility] INVASIVE CV LAB;  Service: Cardiovascular;  Laterality: N/A;  . Peripheral vascular catheterization  08/30/2015    Procedure: Lower Extremity Intervention;  Surgeon: Renford DillsGregory G Schnier, MD;  Location: [facility] INVASIVE CV LAB;  Service: Cardiovascular;;  . Amputation toe Left 11/05/2015    Procedure: AMPUTATION TOE;  Surgeon: Recardo EvangelistMatthew Klaudia Beirne, DPM;  Location: [facility] ORS;  Service: Podiatry;  Laterality: Left;  . Irrigation and debridement foot Left 11/05/2015    Procedure: IRRIGATION AND DEBRIDEMENT FOOT / HEEL ULCER;  Surgeon: Recardo EvangelistMatthew Mariette Cowley, DPM;  Location: [facility] ORS;  Service: Podiatry;  Laterality: Left;  . Application of wound vac Left 11/05/2015    Procedure: APPLICATION OF WOUND VAC;  Surgeon: Recardo EvangelistMatthew Sheela Mcculley, DPM;  Location: [facility] ORS;   Service: Podiatry;  Laterality: Left;    in for   Chief Complaint  Patient presents with  . Foot Injury     HPI  [name]  is a 82 y.o. male, Admitted for worsening decubitus ulcer to the left heel and buttocks    Social History Social History  Substance Use Topics  . Smoking status: Never Smoker   . Smokeless tobacco: Never Used  . Alcohol Use: No   Family History Family History  Problem Relation Age of Onset  . Heart disease Mother   . Heart attack Father 3262    MI  . Heart attack Brother [id number]    MI     Prior to Admission medications   Medication Sig Start Date End Date Taking? Authorizing Provider  amiodarone (PACERONE) 200 MG tablet Take 100 mg by mouth daily.   Yes Historical Provider, MD  aspirin EC 81 MG tablet Take 1 tablet (81 mg total) by mouth daily. 08/30/15  Yes Renford DillsGregory G Schnier, MD  carbidopa-levodopa (SINEMET IR) 25-100 MG per tablet Take 1 tablet by  mouth 4 (four) times daily.   Yes Historical Provider, MD  clopidogrel (PLAVIX) 75 MG tablet Take 75 mg by mouth daily.   Yes Historical Provider, MD  colchicine 0.6 MG tablet Take 0.6 mg by mouth daily. For 10 days. 11/18/15 11/28/15 Yes Historical Provider, MD  cyanocobalamin (,VITAMIN B-12,) 1000 MCG/ML injection Inject 1 mL (1,000 mcg total) into the muscle every 30 (thirty) days. 1 injection in 2 weeks then every 30 days 09/19/15  Yes Steele Sizer, MD  feeding supplement, GLUCERNA SHAKE, (GLUCERNA SHAKE) LIQD Take 237 mLs by mouth 3 (three) times daily between meals. 11/09/15  Yes Auburn Bilberry, MD  furosemide (LASIX) 40 MG tablet Take 1 tablet (40 mg total) by mouth 2 (two) times daily. 07/29/15  Yes Antonieta Iba, MD  levothyroxine (SYNTHROID, LEVOTHROID) 150 MCG tablet Take 150 mcg by mouth daily before breakfast.   Yes Historical Provider, MD  oxyCODONE-acetaminophen (PERCOCET/ROXICET) 5-325 MG tablet Take 1 tablet by mouth every 4 (four) hours as needed for moderate pain. 11/09/15  Yes Auburn Bilberry, MD  piperacillin-tazobactam (ZOSYN) 3.375 GM/50ML IVPB Inject 3.375 g into the vein every 8 (eight) hours. 11/10/15 11/23/15 Yes Historical Provider, MD  potassium chloride (K-DUR,KLOR-CON) 10 MEQ tablet Take 10 mEq by mouth daily.  10/07/15  Yes Historical Provider, MD  pramipexole (MIRAPEX) 1 MG tablet Take 0.5 mg by mouth 4 (four) times daily.    Yes Historical Provider, MD  predniSONE (DELTASONE) 20 MG tablet Take 20 mg by mouth daily. For 5 days 11/18/15 11/23/15 Yes Historical Provider, MD  sitaGLIPtin (JANUVIA) 100 MG tablet Take 1 tablet (100 mg total) by mouth daily. 07/11/15  Yes Steele Sizer, MD  tamsulosin (FLOMAX) 0.4 MG CAPS capsule Take 1 capsule (0.4 mg total) by mouth daily after supper. 10/17/15  Yes Steele Sizer, MD  vancomycin 1,000 mg in sodium chloride 0.9 % 250 mL Inject 1,000 mg into the vein every 12 (twelve) hours. 11/16/15  Yes Historical Provider, MD  Wound Dressings (MEDIHONEY WOUND/BURN DRESSING) GEL Apply topically daily. Apply to buttocks   Yes Historical Provider, MD    Anti-infectives    Start     Dose/Rate Route Frequency Ordered Stop   11/22/15 1400  piperacillin-tazobactam (ZOSYN) IVPB 3.375 g     3.375 g 12.5 mL/hr over 240 Minutes Intravenous Every 8 hours 11/22/15 1032     11/22/15 0600  vancomycin (VANCOCIN) IVPB 1000 mg/200 mL premix     1,000 mg 200 mL/hr over 60 Minutes Intravenous Every 12 hours 11/21/15 2012     11/21/15 2200  vancomycin (VANCOCIN) powder 1,000 mg  Status:  Discontinued     1,000 mg Other Every 12 hours 11/21/15 1655 11/21/15 1712   11/21/15 2000  piperacillin-tazobactam (ZOSYN) IVPB 4.5 g  Status:  Discontinued     4.5 g 25 mL/hr over 240 Minutes Intravenous Every 8 hours 11/21/15 1735 11/22/15 1032   11/21/15 1830  piperacillin-tazobactam (ZOSYN) IVPB 3.375 g  Status:  Discontinued     3.375 g 12.5 mL/hr over 240 Minutes Intravenous Every 8 hours 11/21/15 1655 11/21/15 1735   11/21/15 1215  vancomycin (VANCOCIN) IVPB 1000  mg/200 mL premix     1,000 mg 200 mL/hr over 60 Minutes Intravenous  Once 11/21/15 1210 11/21/15 1346   11/21/15 1215  piperacillin-tazobactam (ZOSYN) IVPB 3.375 g     3.375 g 100 mL/hr over 30 Minutes Intravenous  Once 11/21/15 1210 11/21/15 1346      Scheduled Meds: . amiodarone  100 mg Oral BID  . aspirin EC  81 mg Oral Daily  . carbidopa-levodopa  1 tablet Oral QID  . colchicine  0.6 mg Oral Daily  . [START ON 11/23/2015] cyanocobalamin  1,000 mcg Intramuscular Q30 days  . enoxaparin (LOVENOX) injection  40 mg Subcutaneous Q24H  . feeding supplement (GLUCERNA SHAKE)  237 mL Oral TID BM  . [START ON 11/23/2015] furosemide  40 mg Oral Daily  . insulin aspart  0-5 Units Subcutaneous QHS  . insulin aspart  0-9 Units Subcutaneous TID WC  . levothyroxine  150 mcg Oral QAC breakfast  . linagliptin  5 mg Oral Daily  . piperacillin-tazobactam (ZOSYN)  IV  3.375 g Intravenous Q8H  . potassium chloride  20 mEq Oral BID  . pramipexole  0.5 mg Oral TID  . tamsulosin  0.4 mg Oral QPC supper  . terbinafine   Topical BID  . vancomycin  1,000 mg Intravenous Q12H  Physical Exam  Vitals  Blood pressure 119/66, pulse 73, temperature 96.6 F (35.9 C), temperature source Oral, resp. rate 18, height 6' (1.829 m), weight 82.555 kg (182 lb), SpO2 98 %.  Lower Extremity exam: Dressings to the left foot intact. Incision and previous amputation site still looks very good. We will leave dressings intact today for both heels is scheduled for angiogram tomorrow with Dr. Lorretta Harp.   CBC  Recent Labs Lab 11/21/15 1236 11/22/15 0340  WBC 9.7 9.1  HGB 11.8* 11.7*  HCT 35.6* 35.2*  PLT 420 411  MCV 90.2 90.8  MCH 29.9 30.3  MCHC 33.1 33.4  RDW 15.7* 15.4*  LYMPHSABS 1.1  --   MONOABS 0.8  --   EOSABS 0.2  --   BASOSABS 0.1  --    ------------------------------------------------------------------------------------------------------------------  Chemistries   Recent Labs Lab 11/21/15 1236  11/22/15 0340  NA 138 140  K 3.2* 3.6  CL 102 104  CO2 27 28  GLUCOSE 242* 195*  BUN 21* 20  CREATININE 1.12 1.18  CALCIUM 8.9 8.6*   ----- Assessment & Plan: Decubitus ulcer to the left heel. Right heel was held blister but it looks to be stable. Incision margin and amputation site is stable. Plan: Leave dressings intact today. Scheduled for injury gram tomorrow. I will schedule him for Thursday for debridement of the heel eschar and likely reapplication of wound VAC.  Active Problems:   Heel ulcer (HCC)     Family Communication: Plan discussed with patient .   Epimenio Sarin M.D on 11/22/2015 at 5:30 PM

## 2015-11-22 NOTE — Progress Notes (Signed)
Inpatient Diabetes Program Recommendations  AACE/ADA: New Consensus Statement on Inpatient Glycemic Control (2015)  Target Ranges:  Prepandial:   less than 140 mg/dL      Peak postprandial:   less than 180 mg/dL (1-2 hours)      Critically ill patients:  140 - 180 mg/dL   Results for Henry RusselHENSLEY, Jaquae K (MRN [id number]) as of 11/22/2015 08:49  Ref. Range 11/21/2015 16:43 11/21/2015 21:15 11/22/2015 07:43  Glucose-Capillary Latest Ref Range: 65-99 mg/dL 409245 (H) 811250 (H) 914213 (H)   Review of Glycemic Control  Diabetes history: DM2 Outpatient Diabetes medications: Januvia 100 mg daily Current orders for Inpatient glycemic control: None  Inpatient Diabetes Program Recommendations Correction (SSI): Please consider ordering CBGs with Novolog moderate correction scale ACHS. HgbA1C: Please consider ordering an A1C to evaluate glycemic control over the past 2-3 months.  Thanks, Orlando PennerMarie Baden Betsch, RN, MSN, CDE Diabetes Coordinator Inpatient Diabetes Program [phone] (Team Pager from 8am to 5pm) [phone] (AP office) [phone] [facility] office) [phone] [facility] office)

## 2015-11-22 NOTE — Progress Notes (Addendum)
ANTIBIOTIC CONSULT NOTE - Follow up  Pharmacy Consult for Vancomycin/Zosyn  Indication: wound infection  No Known Allergies  Patient Measurements: Height: 6' (182.9 cm) Weight: 182 lb (82.555 kg) IBW/kg (Calculated) : 77.6    Vital Signs: Temp: 96.6 F (35.9 C) (06/20 1546) Temp Source: Oral (06/20 1546) BP: 119/66 mmHg (06/20 1546) Pulse Rate: 73 (06/20 1546) Intake/Output from previous day: 06/19 0701 - 06/20 0700 In: 240 [P.O.:240] Out: 470 [Urine:470] Intake/Output from this shift:    Labs:  Recent Labs  11/21/15 1236 11/22/15 0340  WBC 9.7 9.1  HGB 11.8* 11.7*  PLT 420 411  CREATININE 1.12 1.18   Estimated Creatinine Clearance: 47.5 mL/min (by C-G formula based on Cr of 1.18).  Recent Labs  11/21/15 1813 11/22/15 1845  VANCOTROUGH  --  27*  VANCORANDOM 30  --      Microbiology: Recent Results (from the past 720 hour(s))  WOUND CULTURE ([facility] ONLY)     Status: None   Collection Time: 11/01/15  1:20 PM  Result Value Ref Range Status   Specimen Description HEEL  Final   Special Requests LEFT  Final   Gram Stain RARE WBC SEEN NO ORGANISMS SEEN   Final   Culture   Final    LIGHT GROWTH PROTEUS MIRABILIS LIGHT GROWTH METHICILLIN RESISTANT STAPHYLOCOCCUS AUREUS MODERATE GROWTH ENTEROCOCCUS FAECALIS    Report Status 11/09/2015 FINAL  Final   Organism ID, Bacteria PROTEUS MIRABILIS  Final   Organism ID, Bacteria METHICILLIN RESISTANT STAPHYLOCOCCUS AUREUS  Final   Organism ID, Bacteria ENTEROCOCCUS FAECALIS  Final      Susceptibility   Proteus mirabilis - MIC*    AMPICILLIN <=2 SENSITIVE Sensitive     CEFAZOLIN <=4 SENSITIVE Sensitive     CEFEPIME <=1 SENSITIVE Sensitive     CEFTAZIDIME <=1 SENSITIVE Sensitive     CEFTRIAXONE <=1 SENSITIVE Sensitive     CIPROFLOXACIN <=0.25 SENSITIVE Sensitive     GENTAMICIN <=1 SENSITIVE Sensitive     IMIPENEM 2 SENSITIVE Sensitive     TRIMETH/SULFA <=20 SENSITIVE Sensitive     AMPICILLIN/SULBACTAM <=2  SENSITIVE Sensitive     PIP/TAZO <=4 SENSITIVE Sensitive     * LIGHT GROWTH PROTEUS MIRABILIS   Enterococcus faecalis - MIC*    AMPICILLIN SENSITIVE Sensitive     LINEZOLID SENSITIVE Sensitive     * MODERATE GROWTH ENTEROCOCCUS FAECALIS   Methicillin resistant staphylococcus aureus - MIC*    CIPROFLOXACIN >=8 RESISTANT Resistant     ERYTHROMYCIN >=8 RESISTANT Resistant     GENTAMICIN <=0.5 SENSITIVE Sensitive     OXACILLIN >=4 RESISTANT Resistant     TETRACYCLINE 2 SENSITIVE Sensitive     VANCOMYCIN 1 SENSITIVE Sensitive     TRIMETH/SULFA <=10 SENSITIVE Sensitive     CLINDAMYCIN >=8 RESISTANT Resistant     RIFAMPIN <=0.5 SENSITIVE Sensitive     Inducible Clindamycin NEGATIVE Sensitive     * LIGHT GROWTH METHICILLIN RESISTANT STAPHYLOCOCCUS AUREUS  Surgical pcr screen     Status: Abnormal   Collection Time: 11/04/15  6:11 PM  Result Value Ref Range Status   MRSA, PCR POSITIVE (A) NEGATIVE Final    Comment: CRITICAL RESULT CALLED TO, READ BACK BY AND VERIFIED WITH: MARY RAYMOND AT 2324 11/04/15.PMH    Staphylococcus aureus POSITIVE (A) NEGATIVE Final    Comment:        The Xpert SA Assay (FDA approved for NASAL specimens in patients over 80 years of age), is one component of a comprehensive surveillance program.  Test performance has been validated by [facility] for patients greater than or equal to 21 year old. It is not intended to diagnose infection nor to guide or monitor treatment.   Anaerobic culture     Status: None   Collection Time: 11/05/15 12:17 PM  Result Value Ref Range Status   Specimen Description WOUND  Final   Special Requests NONE  Final   Culture NO ANAEROBES ISOLATED  Final   Report Status 11/11/2015 FINAL  Final  WOUND CULTURE ([facility] ONLY)     Status: None   Collection Time: 11/05/15 12:17 PM  Result Value Ref Range Status   Specimen Description WOUND  Final   Special Requests NONE  Final   Gram Stain   Final    FEW RED BLOOD CELLS RARE WBC  SEEN NO ORGANISMS SEEN    Culture   Final    LIGHT GROWTH PROTEUS MIRABILIS LIGHT GROWTH STAPHYLOCOCCUS AUREUS    Report Status 11/08/2015 FINAL  Final   Organism ID, Bacteria PROTEUS MIRABILIS  Final   Organism ID, Bacteria PROTEUS MIRABILIS  Final   Organism ID, Bacteria STAPHYLOCOCCUS AUREUS  Final   Organism ID, Bacteria STAPHYLOCOCCUS AUREUS  Final      Susceptibility   Staphylococcus aureus - MIC*    CEFAZOLIN RESISTANT Resistant     LEVOFLOXACIN RESISTANT Resistant     OXACILLIN RESISTANT Resistant     TRIMETH/SULFA SENSITIVE Sensitive     VANCOMYCIN SENSITIVE Sensitive     TETRACYCLINE SENSITIVE Sensitive    Staphylococcus aureus - MIC*    CIPROFLOXACIN >=8 RESISTANT Resistant     ERYTHROMYCIN >=8 RESISTANT Resistant     GENTAMICIN <=0.5 SENSITIVE Sensitive     OXACILLIN >=4 RESISTANT Resistant     TETRACYCLINE <=1 SENSITIVE Sensitive     VANCOMYCIN 1 SENSITIVE Sensitive     TRIMETH/SULFA <=10 SENSITIVE Sensitive     CLINDAMYCIN >=8 RESISTANT Resistant     RIFAMPIN <=0.5 SENSITIVE Sensitive     Inducible Clindamycin NEGATIVE Sensitive     * LIGHT GROWTH STAPHYLOCOCCUS AUREUS    LIGHT GROWTH STAPHYLOCOCCUS AUREUS   Proteus mirabilis - MIC*    AMPICILLIN RESISTANT Resistant     AMPICILLIN/SULBACTAM RESISTANT Resistant     CEFAZOLIN RESISTANT Resistant     CIPROFLOXACIN SENSITIVE Sensitive     GENTAMICIN SENSITIVE Sensitive     IMIPENEM SENSITIVE Sensitive     TRIMETH/SULFA SENSITIVE Sensitive    Proteus mirabilis - MIC*    AMPICILLIN >=32 RESISTANT Resistant     CEFAZOLIN >=64 RESISTANT Resistant     CEFEPIME <=1 RESISTANT Resistant     CEFTAZIDIME 16 RESISTANT Resistant     CEFTRIAXONE 8 RESISTANT Resistant     CIPROFLOXACIN <=0.25 SENSITIVE Sensitive     GENTAMICIN <=1 SENSITIVE Sensitive     IMIPENEM 1 SENSITIVE Sensitive     TRIMETH/SULFA <=20 SENSITIVE Sensitive     AMPICILLIN/SULBACTAM >=32 RESISTANT Resistant     PIP/TAZO <=4 SENSITIVE Sensitive     *  LIGHT GROWTH PROTEUS MIRABILIS    LIGHT GROWTH PROTEUS MIRABILIS  Blood culture (routine x 2)     Status: None (Preliminary result)   Collection Time: 11/21/15 12:37 PM  Result Value Ref Range Status   Specimen Description BLOOD LEFT HAND  Final   Special Requests   Final    BOTTLES DRAWN AEROBIC AND ANAEROBIC  AERO 10CC ANA 7CC   Culture NO GROWTH 1 DAY  Final   Report Status PENDING  Incomplete  Blood  culture (routine x 2)     Status: None (Preliminary result)   Collection Time: 11/21/15 12:37 PM  Result Value Ref Range Status   Specimen Description BLOOD RIGHT FATTY CASTS  Final   Special Requests BOTTLES DRAWN AEROBIC AND ANAEROBIC  1CC  Final   Culture NO GROWTH 1 DAY  Final   Report Status PENDING  Incomplete    Assessment: Pharmacy consulted to dose vanc and zosyn in this 82 year old male admitted from [facility] with infected heel ulcer.  Pt was being treated at [facility] with Vanc and Zosyn but pt's condition continued to worsen.   Will draw random Vanc level on 6/19 @ 16:00.   CrCl = 51.3 ml/min Ke = 0.047 hr-1 T1/2 = 14.7 hrs VD = 57.8 L   Pt received Vanc 1 g IV X 1 in ED on 6/19 @ 12:42. 6/19 :  Random Vanc level @ 1813 = 30 mcg/mL   6/20:  Vanc trough @ 18:45 = 27 mcg/mL.   Level was drawn @ 18:45 but vanc dose was hung @ 18:26.  Therefore, this is not a true trough.  Will reorder Vanc trough before next dose on 6/21 @ 5:30.    Goal of Therapy:  Vancomycin trough level 15-20 mcg/ml  Plan:  Current order for Zosyn 4.5 gm IV Q8H EI ordered to start on 6/19 @ 20:00. Will transition to Zosyn 3.375 g IV q8h EI as continuation of regimen pt was on at [facility].   Vanc level on 6/19 @ 18:00 was 30 mcg/mL. Therefore, will wait 12 hrs ( approximately 1 half-life) before resuming vancomycin.  Vancomycin 1 gm IV Q12H ordered to start 6/20 @ 6:00.  6/20: Will get a vanc level before dose this evening to see where we are and to ensure that pt is clearing  doses. This level will not be at steady state. If level <20, likely continue vanc 1 g IV q12h and recheck level after a couple more doses.   Pharmacy will continue to follow.   [name],[name] 11/22/2015,7:33 PM   Addendum: [name] and spoke with [name], RPh [phone]): - vancomycin 750 mg q12h from 6/8 to 6/12 - Vanc trough 6/12 of 11.5, dose increased to vanc 1g IV q12 - Vanc trough 6/15 of 15.8 (Scr 1.18) before 3rd dose of vanc 1 g IV q12h - Pt got both doses (0900 and 2100) on Sun 6/18 but does not appear to have gotten 6/19 0900 dose before leaving facility and coming to [facility]   11/23/2015 0623 lab reports vancomycin trough 37 mcg/mL. RN says she has not administered dose yet this AM. Renal function approximately stable. New Ke 0.033 hr-1, predicted trough at 0600 tomorrow 16 mcg/mL. Will order random for 0600 tomorrow and redose if appropriate. Lillian Ballester A. [place], [place].[name]., BCPS, Clinical Pharmacist

## 2015-11-23 ENCOUNTER — Encounter
Admission: EM | Disposition: A | Discharge status: Skilled Nursing Facility | Payer: Self-pay | Source: Home / Self Care | Attending: Internal Medicine

## 2015-11-23 HISTORY — PX: PERIPHERAL VASCULAR CATHETERIZATION: SHX172C

## 2015-11-23 LAB — BASIC METABOLIC PANEL
Anion gap: 8 (ref 5–15)
BUN: 20 mg/dL (ref 6–20)
CO2: 28 mmol/L (ref 22–32)
Calcium: 8.7 mg/dL — ABNORMAL LOW (ref 8.9–10.3)
Chloride: 103 mmol/L (ref 101–111)
Creatinine, Ser: 1.18 mg/dL (ref 0.61–1.24)
GFR calc Af Amer: >60 mL/min (ref >60)
GFR, EST NON AFRICAN AMERICAN: 53 mL/min — AB (ref >60)
GLUCOSE: 157 mg/dL — AB (ref 65–99)
POTASSIUM: 3.8 mmol/L (ref 3.5–5.1)
Sodium: 139 mmol/L (ref 135–145)

## 2015-11-23 LAB — CBC
HEMATOCRIT: 34.8 % — AB (ref 40.0–52.0)
Hemoglobin: 11.9 g/dL — ABNORMAL LOW (ref 13.0–18.0)
MCH: 30.5 pg (ref 26.0–34.0)
MCHC: 34.2 g/dL (ref 32.0–36.0)
MCV: 89.1 fL (ref 80.0–100.0)
PLATELETS: 387 10*3/uL (ref 150–440)
RBC: 3.91 MIL/uL — AB (ref 4.40–5.90)
RDW: 15.6 % — AB (ref 11.5–14.5)
WBC: 9.3 10*3/uL (ref 3.8–10.6)

## 2015-11-23 LAB — MAGNESIUM: Magnesium: 1.9 mg/dL (ref 1.7–2.4)

## 2015-11-23 LAB — GLUCOSE, CAPILLARY
GLUCOSE-CAPILLARY: 154 mg/dL — AB (ref 65–99)
Glucose-Capillary: 143 mg/dL — ABNORMAL HIGH (ref 65–99)
Glucose-Capillary: 148 mg/dL — ABNORMAL HIGH (ref 65–99)

## 2015-11-23 LAB — VANCOMYCIN, TROUGH: VANCOMYCIN TR: 37 ug/mL — AB (ref 10–20)

## 2015-11-23 SURGERY — ABDOMINAL AORTOGRAM W/LOWER EXTREMITY
Anesthesia: Moderate Sedation | Laterality: Left

## 2015-11-23 MED ORDER — LIDOCAINE HCL (PF) 1 % IJ SOLN
INTRAMUSCULAR | Status: AC
  Filled 2015-11-23: qty 30

## 2015-11-23 MED ORDER — FENTANYL CITRATE (PF) 100 MCG/2ML IJ SOLN
INTRAMUSCULAR | Status: DC | PRN
  Administered 2015-11-23 (×2): 50 ug via INTRAVENOUS

## 2015-11-23 MED ORDER — LIDOCAINE HCL (PF) 1 % IJ SOLN
INTRAMUSCULAR | Status: DC | PRN
  Administered 2015-11-23: 5 mL

## 2015-11-23 MED ORDER — HEPARIN (PORCINE) IN NACL 2-0.9 UNIT/ML-% IJ SOLN
INTRAMUSCULAR | Status: AC
  Filled 2015-11-23: qty 1000

## 2015-11-23 MED ORDER — CLOPIDOGREL BISULFATE 75 MG PO TABS: 300.0000 mg | ORAL_TABLET | ORAL | Status: AC

## 2015-11-23 MED ORDER — DEXTROSE 5 % IV SOLN
1.5000 g | Freq: Once | INTRAVENOUS | Status: AC
  Administered 2015-11-23: 1.5 g via INTRAVENOUS

## 2015-11-23 MED ORDER — CLOPIDOGREL BISULFATE 75 MG PO TABS
75.0000 mg | ORAL_TABLET | Freq: Every day | ORAL | Status: DC
  Administered 2015-11-26 – 2015-11-28 (×3): 75 mg via ORAL
  Filled 2015-11-23 (×4): qty 1

## 2015-11-23 MED ORDER — MIDAZOLAM HCL 2 MG/2ML IJ SOLN
INTRAMUSCULAR | Status: DC | PRN
  Administered 2015-11-23: 1 mg via INTRAVENOUS
  Administered 2015-11-23: 2 mg via INTRAVENOUS

## 2015-11-23 MED ORDER — HEPARIN SODIUM (PORCINE) 1000 UNIT/ML IJ SOLN
INTRAMUSCULAR | Status: DC | PRN
  Administered 2015-11-23: 5000 [IU] via INTRAVENOUS

## 2015-11-23 SURGICAL SUPPLY — 20 items
BALLN ARMADA 2.5X20X150 (BALLOONS) ×4
BALLN ULTRVRSE 3X150X150 (BALLOONS) ×4
BALLOON ARMADA 2.5X20X150 (BALLOONS) ×2 IMPLANT
BALLOON ULTRVRSE 3X150X150 (BALLOONS) ×2 IMPLANT
CATH G STR 4FX120.038 (CATHETERS) ×4 IMPLANT
CATH GWIRE MARINER STRGHT 4FR (CATHETERS) ×4 IMPLANT
CATH PIG 70CM (CATHETERS) ×8 IMPLANT
CATH RIM 65CM (CATHETERS) ×4 IMPLANT
DEVICE PRESTO INFLATION (MISCELLANEOUS) ×4 IMPLANT
DEVICE STARCLOSE SE CLOSURE (Vascular Products) ×4 IMPLANT
GLIDEWIRE ANGLED SS 035X260CM (WIRE) ×4 IMPLANT
PACK ANGIOGRAPHY (CUSTOM PROCEDURE TRAY) ×4 IMPLANT
SET INTRO CAPELLA COAXIAL (SET/KITS/TRAYS/PACK) ×4 IMPLANT
SHEATH BRITE TIP 5FRX11 (SHEATH) ×4 IMPLANT
SHEATH RAABE 6FR (SHEATH) ×4 IMPLANT
SHIELD RADPAD SCOOP 12X17 (MISCELLANEOUS) ×4 IMPLANT
SYR MEDRAD MARK V 150ML (SYRINGE) ×4 IMPLANT
TUBING CONTRAST HIGH PRESS 72 (TUBING) ×4 IMPLANT
WIRE G V18X300CM (WIRE) ×4 IMPLANT
WIRE J 3MM .035X145CM (WIRE) ×4 IMPLANT

## 2015-11-23 NOTE — Progress Notes (Signed)
[facility] [place] [facility]   PATIENT NAME: [name]    MR#:  [id number]  DATE OF BIRTH:  03/25/1925  SUBJECTIVE:  CHIEF COMPLAINT:   Chief Complaint  Patient presents with  . Foot Injury   - admitted with left heel ulcer- recent left first ray amputation - for angiogram tomorrow - Daughter at bedside  REVIEW OF SYSTEMS:  Review of Systems  Constitutional: Positive for fever and malaise/fatigue. Negative for chills.  HENT: Positive for hearing loss. Negative for ear discharge, ear pain and nosebleeds.   Eyes: Negative for blurred vision and double vision.  Respiratory: Negative for cough, shortness of breath and wheezing.   Cardiovascular: Negative for chest pain, palpitations and leg swelling.  Gastrointestinal: Negative for nausea, vomiting, abdominal pain, diarrhea and constipation.  Genitourinary: Negative for dysuria and hematuria.  Musculoskeletal: Positive for myalgias.  Neurological: Negative for dizziness, speech change, focal weakness, seizures and headaches.  Psychiatric/Behavioral: Negative for depression.    DRUG ALLERGIES:  No Known Allergies  VITALS:  Blood pressure 137/72, pulse 63, temperature 97 F (36.1 C), temperature source Oral, resp. rate 13, height 6' (1.829 m), weight 82.555 kg (182 lb), SpO2 94 %.  PHYSICAL EXAMINATION:  Physical Exam  GENERAL:  82 y.o.-year-old elderly patient lying in the bed with no acute distress.  EYES: Pupils equal, round, reactive to light and accommodation. No scleral icterus. Extraocular muscles intact.  HEENT: Head atraumatic, normocephalic. Oropharynx and nasopharynx clear.  NECK:  Supple, no jugular venous distention. No thyroid enlargement, no tenderness.  LUNGS: Normal breath sounds bilaterally, no wheezing, rales,rhonchi or crepitation. No use of accessory muscles of respiration. Decreased bibasilar breath sounds CARDIOVASCULAR: S1, S2 normal. No rubs, or gallops. 3/6 systolic  murmur present ABDOMEN: Soft, nontender, nondistended. Bowel sounds present. No organomegaly or mass.  EXTREMITIES: Left foot in dressing, both in heel floats, likely fungal nail infection of hand nails NEUROLOGIC: Cranial nerves II through XII are intact. Muscle strength 5/5 in all extremities. Sensation intact. Gait not checked.  PSYCHIATRIC: The patient is alert and oriented x 3.  SKIN: No obvious rash, lesion, or ulcer.    LABORATORY PANEL:   CBC  Recent Labs Lab 11/23/15 0541  WBC 9.3  HGB 11.9*  HCT 34.8*  PLT 387   ------------------------------------------------------------------------------------------------------------------  Chemistries   Recent Labs Lab 11/23/15 0541  NA 139  K 3.8  CL 103  CO2 28  GLUCOSE 157*  BUN 20  CREATININE 1.18  CALCIUM 8.7*  MG 1.9   ------------------------------------------------------------------------------------------------------------------  Cardiac Enzymes No results for input(s): TROPONINI in the last 168 hours. ------------------------------------------------------------------------------------------------------------------  RADIOLOGY:  No results found.  EKG:   Orders placed or performed in visit on 10/28/15  . EKG 12-Lead    ASSESSMENT AND PLAN:   82 year old male with past medical history of peripheral vascular disease, coronary artery disease, diabetes type 2 without, patient, history of aortic stenosis, hypertension, hyperlipidemia, history of previous TIA, hypothyroidism admitted from [facility] with left heel ulcer  1. Left heel ulcer with recent left first ray amputation- has a PICC line and was on IV ABX at the rehab -Appreciate podiatry and vascular consults. -For angiogram today for both legs likely. Followed by that, will need debridement of that left heel ulcer by Podiatry- likely tomorrow. - Continue empiric antibiotics with vancomycin and Zosyn- had MRSA and proteus in the wound 2 weeks  ago  2. History of Parkinson's disease-continue Sinemet.  3. Atrial fibrillation-on amiodarone. Continue low-dose  aspirin. Rate controlled  4. Hypothyroidism-continue Synthroid.  5. Chronic venous insufficiency-patient is on Lasix at home. Decreased the dose. Leg wrappings, keep the legs elevated if needed. Last echo from February 2016 with normal ejection fraction.  6. Restless leg syndrome-continue Mirapex.  7. BPH-continue Flomax.  8. Hypothyroidism-continue Synthroid.   Physical therapy consulted- needs rehab Wants to go [facility]   All the records are reviewed and case discussed with Care Management/Social Workerr. Management plans discussed with the patient, family and they are in agreement.  CODE STATUS: DNR  TOTAL TIME TAKING CARE OF THIS PATIENT: 33 minutes.   POSSIBLE D/C IN 2 DAYS, DEPENDING ON CLINICAL CONDITION.   Enid Baas M.D on 11/23/2015 at 1:34 PM  Between 7am to 6pm - Pager - [phone]  After 6pm go to [email or web address] - password [email or web address] [facility]  [place] [facility]  Office  [phone]  CC: Primary care physician; Tonette Lederer, PA-C

## 2015-11-23 NOTE — Care Management (Signed)
[facility]

## 2015-11-23 NOTE — Progress Notes (Signed)
Pt was in vascular recovery.  I spoke with Dr. Lorretta HarpSchneir and he feels he was able to improve the PT arterial flow.  Discussed with pts daughter and explained I would debride the heel ulcer left tomorrow.

## 2015-11-23 NOTE — Op Note (Signed)
[name] Percutaneous Study/Intervention Procedural Note   Date of Surgery: 11/23/2015  Surgeon:  Katha Cabal, MD.  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 Introduction catheter into left lower extremity 3rd order catheter placement  2. Contrast injection left lower extremity for distal runoff   3. Percutaneous transluminal angioplasty left posterior tibial artery to 3 mm 4. Star close closure right common femoral arteriotomy  Anesthesia: Conscious sedation was administered under my direct supervision. IV Versed plus fentanyl were utilized. Continuous ECG, pulse oximetry and blood pressure was monitored throughout the entire procedure. Versed and fentanyl were utilized.  Conscious sedation was for a total of 90 minutes.  Sheath: 6 French right common femoral artery  Contrast: 70 cc  Fluoroscopy Time: 9.2 minutes  Indications: [name] presents with gangrene of both heels in association with known atherosclerotic occlusive disease. The risks and benefits are reviewed all questions answered patient agrees to proceed. The left heel seems to be more severe than the right and therefore today we are focusing on the left leg.  Procedure: [name] is a 82 y.o. y.o. male who was identified and appropriate procedural time out was performed. The patient was then placed supine on the table and prepped and draped in the usual sterile fashion.   Ultrasound was placed in the sterile sleeve and the right groin was evaluated the right common femoral artery was echolucent and pulsatile indicating patency.  Image was recorded for the permanent record and under real-time visualization a microneedle was inserted into the common femoral artery microwire followed by a  micro-sheath.  A J-wire was then advanced through the micro-sheath and a  5 [place] sheath was then inserted over a J-wire. J-wire was then advanced and a 5 French pigtail catheter was positioned at the level of T12. AP projection of the aorta was then obtained. Pigtail catheter was repositioned to above the bifurcation and a RAO view of the pelvis was obtained.  Subsequently a rim catheter with the stiff angle Glidewire was used to cross the aortic bifurcation the catheter wire were advanced down into the left distal external iliac artery. Oblique view of the femoral bifurcation was then obtained and subsequently the wire was reintroduced and the pigtail catheter negotiated into the SFA representing third order catheter placement. Distal runoff was then performed.  5000 units of heparin was then given and allowed to circulate and a 6 [place] Rabi sheath was advanced up and over the bifurcation and positioned in the proximal superficial femoral artery  Straight glide catheter and stiff angle Glidewire were then negotiated down into the distal popliteal.  Distal runoff was then completed by hand injection through the catheter. The wire was then reintroduced and negotiated into the posterior tibial. Several hand injections were performed through the catheter to verify posterior tibial artery placement and negotiated the multiple strictures. With the wire position all the way down to the level of the foot the catheter was removed. Initially a 3 mm by 15 cm balloon was advanced down to the level of the malleolus inflation was to 12 atm for 2 minutes. Follow-up imaging demonstrated complete resolution of the areas of narrowing however farther down at the level of the talus a focal greater than 70% narrowing is identified at this level the artery appears to be smaller than what was just treated and therefore a 2.5 mm x 20 mm balloon was  advanced across this focused area inflation was to 10 atm for a minute. Distal  runoff was then reassessed from the popliteal and noted to be widely patent with less than 5% residual stenosis throughout the tibioperoneal trunk and posterior tibial. There is now much improved filling of the pedal arch.  After review of these images the sheath is pulled into the right external iliac oblique of the common femoral is obtained and a Star close device deployed. There no immediate complications.   Findings: The abdominal aorta is opacified with a bolus injection contrast. Renal arteries are patent. The aorta itself quite tortuous as are the iliac arteries and has diffuse disease but no hemodynamically significant lesions. The common and external iliac arteries are widely patent bilaterally.  The left common femoral is widely patent as is the profunda femoris.  The SFA and popliteal demonstrates diffuse disease but no hemodynamically significant lesions. The trifurcation is heavily diseased with occlusion of the anterior tibial artery approximately 2 cm distal to the origin. This artery does not reconstitute throughout it's course and in the images of the foot itself the dorsalis pedis does not reconstitute. The peroneal demonstrates patency but is quite small throughout its course no focal stenosis is identified. The posterior tibial demonstrates multiple greater than 90% stenoses from its midportion down to the level of the ankle. Later in the case at the level of the talus a focal stenosis is noted however the medial and lateral plantar branches appear widely patent and filled the pedal arch.  Following angioplasty of the posterior  tibial there is now in-line flow to the foot and good filling of the pedal arch and lateral and medial plantar vessels.   Summary: Successful recanalization left lower extremity with angioplasty of the posterior tibial artery for limb salvage    Disposition: Patient was taken to the recovery room in stable condition having tolerated  the procedure well.  [name], Chris Lory 11/23/2015,3:49 PM

## 2015-11-23 NOTE — Evaluation (Signed)
Physical Therapy Evaluation Patient Details Name: [name] MRN: [id number] DOB: 04/16/1925 Today's Date: 11/23/2015   History of Present Illness  [name] is a 82 y.o. male with a known history of coronary artery disease, diabetes, aortic stenosis, hypertension, hyperlipidemia, thyroid disease, hypothyroidism- has chronic circulatory issue in both lower extremities and he had undergone surgery by vascular Dr.Schiener- successful restoration of the circulation on the right lower extremity but on the left after 3-4 surgeries there was not successful circulation up to his foot. He was recently admitted to the hospital early June for the same problems and underwent a L foot first ray amputation and debriedment. He returned to the ED from his podiatrist for a L heel ulcer that isn't improving. Pt has been at STR at [facility] since his hospital discharge from the previous admission this month.  Clinical Impression  Pt demonstrated generalized weakness and decreased functional mobility. His LE strength is grossly 2+/5. He is able to roll in bed L and R with mod A. Due to extreme fatigue and being NPO for many hours, EOB NT this session. He is scheduled for an angiogram today.  Returning to STR is recommended after hospital discharge to address deficits of strength, balance, activity tolerance and transfers to progress towards PLOF. Pt will benefit from skilled PT services to increase functional I and mobility for safe discharge.     Follow Up Recommendations SNF    Equipment Recommendations  Other (comment) (hospital bed)    Recommendations for Other Services       Precautions / Restrictions Precautions Precautions: Fall Restrictions Weight Bearing Restrictions: Yes LLE Weight Bearing: Non weight bearing      Mobility  Bed Mobility Overal bed mobility: Needs Assistance Bed Mobility: Rolling Rolling: Mod assist         General bed mobility comments: declines EOB at this  time  Transfers                 General transfer comment: declines OOB this session  Ambulation/Gait             General Gait Details: non-ambulatory at baseline  Stairs            Wheelchair Mobility    Modified Rankin (Stroke Patients Only)       Balance       Sitting balance - Comments: NT this session                                     Pertinent Vitals/Pain Pain Assessment: No/denies pain    Home Living Family/patient expects to be discharged to:: Skilled nursing facility Living Arrangements: Children Available Help at Discharge: Family;Personal care attendant Type of Home: House Home Access: Ramped entrance     Home Layout: One level Home Equipment: Walker - 2 wheels;Cane - single point;Bedside commode;Other (comment);Wheelchair - manual      Prior Function Level of Independence: Needs assistance   Gait / Transfers Assistance Needed: Pt has most recently been performing slide board transfer due to decline in strength. Prior to that he could help with limited transfers. Pt has been using a wheelchair for a couple years for mobility  ADL's / Homemaking Assistance Needed: Requires assist with ADLs/IADLs        Hand Dominance   Dominant Hand: Right    Extremity/Trunk Assessment   Upper Extremity Assessment: Generalized weakness  Lower Extremity Assessment: Generalized weakness;RLE deficits/detail;LLE deficits/detail RLE Deficits / Details: R knee flexion contracture lacking approximately 45 degrees of extension. Strength grossly 2+/5 LLE Deficits / Details: L knee flexion contracture lacking approximately 80-90 degrees of extension. Strength grossly 2+/5     Communication   Communication: No difficulties  Cognition Arousal/Alertness: Awake/alert Behavior During Therapy: WFL for tasks assessed/performed Overall Cognitive Status: Within Functional Limits for tasks assessed                       General Comments General comments (skin integrity, edema, etc.): poor LE skin integrity, B prevalon boots    Exercises Other Exercises Other Exercises: B LE supine therex: gentle ankle pumps, heel slides and hip abd slides with AAROM x 20 each. Limited ROM due to knee flexion contractures B LE. Limited by fatigue and being NPO for the day.      Assessment/Plan    PT Assessment Patient needs continued PT services  PT Diagnosis Generalized weakness   PT Problem List Decreased strength;Decreased activity tolerance;Decreased balance;Decreased range of motion;Decreased mobility;Decreased skin integrity  PT Treatment Interventions DME instruction;Functional mobility training;Therapeutic activities;Therapeutic exercise;Balance training;Neuromuscular re-education;Cognitive remediation;Patient/family education   PT Goals (Current goals can be found in the Care Plan section) Acute Rehab PT Goals Patient Stated Goal: Be able to perform sliding board transfers at home PT Goal Formulation: With patient/family Time For Goal Achievement: 12/07/15 Potential to Achieve Goals: Fair    Frequency Min 2X/week   Barriers to discharge   pt NWB L LE, family may not be able to provided the level of physical assistance he needs at this time    Co-evaluation               End of Session   Activity Tolerance: Patient limited by fatigue Patient left: in bed;with call bell/phone within reach;with nursing/sitter in room;with family/visitor present;Other (comment) Nurse Communication: Mobility status         Time: 7829-5621 PT Time Calculation (min) (ACUTE ONLY): 23 min   Charges:   PT Evaluation $PT Eval High Complexity: 1 Procedure PT Treatments $Therapeutic Exercise: 8-22 mins   PT G Codes:        Adelene Idler, PT, DPT  11/23/2015, 10:59 AM [phone]

## 2015-11-23 NOTE — Progress Notes (Signed)
   11/23/15 1230  Clinical Encounter Type  Visited With Patient and family together  Visit Type Follow-up  Referral From Nurse  Consult/Referral To Chaplain  Spiritual Encounters  Spiritual Needs Prayer;Emotional  Stress Factors  Patient Stress Factors Health changes  Visited w/patient & family prior to his surgery. We reminisced over his [place] experiences and his love for gardening. He seems to gain strength and hope from remembering past victories over challenges and future desires to be out in nature. We will follow up after surgery.   Chap. Vivianna Piccini G. Erionna Strum, ext. [phone]

## 2015-11-24 ENCOUNTER — Encounter: Payer: Self-pay | Admitting: Vascular Surgery

## 2015-11-24 ENCOUNTER — Inpatient Hospital Stay: Payer: PPO | Admitting: Certified Registered Nurse Anesthetist

## 2015-11-24 ENCOUNTER — Encounter
Admission: EM | Disposition: A | Discharge status: Skilled Nursing Facility | Payer: Self-pay | Source: Home / Self Care | Attending: Internal Medicine

## 2015-11-24 HISTORY — PX: IRRIGATION AND DEBRIDEMENT FOOT: SHX6602

## 2015-11-24 LAB — GLUCOSE, CAPILLARY
GLUCOSE-CAPILLARY: 121 mg/dL — AB (ref 65–99)
GLUCOSE-CAPILLARY: 126 mg/dL — AB (ref 65–99)
GLUCOSE-CAPILLARY: 117 mg/dL — AB (ref 65–99)
Glucose-Capillary: 139 mg/dL — ABNORMAL HIGH (ref 65–99)

## 2015-11-24 LAB — VANCOMYCIN, TROUGH: VANCOMYCIN TR: 17 ug/mL (ref 10–20)

## 2015-11-24 SURGERY — IRRIGATION AND DEBRIDEMENT FOOT
Anesthesia: Monitor Anesthesia Care | Laterality: Left | Wound class: Dirty or Infected

## 2015-11-24 MED ORDER — BUPIVACAINE HCL (PF) 0.5 % IJ SOLN
INTRAMUSCULAR | Status: DC | PRN
  Administered 2015-11-24: 4 mL

## 2015-11-24 MED ORDER — VANCOMYCIN HCL IN DEXTROSE 1-5 GM/200ML-% IV SOLN
1000.0000 mg | INTRAVENOUS | Status: DC
  Administered 2015-11-24 – 2015-11-25 (×2): 1000 mg via INTRAVENOUS
  Filled 2015-11-24 (×2): qty 200

## 2015-11-24 MED ORDER — MIDAZOLAM HCL 2 MG/2ML IJ SOLN
INTRAMUSCULAR | Status: DC | PRN
  Administered 2015-11-24: 0.5 mg via INTRAVENOUS
  Administered 2015-11-24: 1 mg via INTRAVENOUS

## 2015-11-24 MED ORDER — FENTANYL CITRATE (PF) 100 MCG/2ML IJ SOLN: 25.0000 ug | INTRAMUSCULAR | Status: DC | PRN

## 2015-11-24 MED ORDER — ONDANSETRON HCL 4 MG/2ML IJ SOLN: 4.0000 mg | Freq: Once | INTRAMUSCULAR | Status: DC | PRN

## 2015-11-24 MED ORDER — LIDOCAINE HCL (PF) 1 % IJ SOLN
INTRAMUSCULAR | Status: DC | PRN
  Administered 2015-11-24: 4 mL

## 2015-11-24 MED ORDER — ACETAMINOPHEN 10 MG/ML IV SOLN
INTRAVENOUS | Status: AC
  Filled 2015-11-24: qty 100

## 2015-11-24 MED ORDER — LIDOCAINE HCL (PF) 1 % IJ SOLN
INTRAMUSCULAR | Status: AC
  Filled 2015-11-24: qty 30

## 2015-11-24 MED ORDER — FENTANYL CITRATE (PF) 100 MCG/2ML IJ SOLN
INTRAMUSCULAR | Status: DC | PRN
  Administered 2015-11-24: 25 ug via INTRAVENOUS
  Administered 2015-11-24: 50 ug via INTRAVENOUS
  Administered 2015-11-24: 25 ug via INTRAVENOUS

## 2015-11-24 MED ORDER — BUPIVACAINE HCL (PF) 0.5 % IJ SOLN
INTRAMUSCULAR | Status: AC
  Filled 2015-11-24: qty 30

## 2015-11-24 SURGICAL SUPPLY — 41 items
BAG COUNTER SPONGE EZ (MISCELLANEOUS) IMPLANT
BANDAGE ELASTIC 3 LF NS (GAUZE/BANDAGES/DRESSINGS) ×3 IMPLANT
BANDAGE ELASTIC 4 LF NS (GAUZE/BANDAGES/DRESSINGS) ×3 IMPLANT
BANDAGE STRETCH 3X4.1 STRL (GAUZE/BANDAGES/DRESSINGS) IMPLANT
BNDG ESMARK 4X12 TAN STRL LF (GAUZE/BANDAGES/DRESSINGS) ×3 IMPLANT
BNDG GAUZE 4.5X4.1 6PLY STRL (MISCELLANEOUS) ×6 IMPLANT
CANISTER SUCT 1200ML W/VALVE (MISCELLANEOUS) ×3 IMPLANT
COUNTER SPONGE BAG EZ (MISCELLANEOUS)
CUFF TOURN 18 STER (MISCELLANEOUS) ×3 IMPLANT
CUFF TOURN DUAL PL 12 NO SLV (MISCELLANEOUS) ×3 IMPLANT
DRAPE FLUOR MINI C-ARM 54X84 (DRAPES) IMPLANT
DRSG VAC ATS MED SENSATRAC (GAUZE/BANDAGES/DRESSINGS) ×3 IMPLANT
DURAPREP 26ML APPLICATOR (WOUND CARE) ×3 IMPLANT
ELECT REM PT RETURN 9FT ADLT (ELECTROSURGICAL) ×3
ELECTRODE REM PT RTRN 9FT ADLT (ELECTROSURGICAL) ×1 IMPLANT
GAUZE PETRO XEROFOAM 1X8 (MISCELLANEOUS) IMPLANT
GAUZE SPONGE 4X4 12PLY STRL (GAUZE/BANDAGES/DRESSINGS) IMPLANT
GLOVE BIO SURGEON STRL SZ8 (GLOVE) ×3 IMPLANT
GLOVE INDICATOR 7.5 STRL GRN (GLOVE) ×3 IMPLANT
GOWN STRL REUS W/ TWL LRG LVL3 (GOWN DISPOSABLE) ×1 IMPLANT
GOWN STRL REUS W/TWL LRG LVL3 (GOWN DISPOSABLE) ×2
KIT RM TURNOVER STRD PROC AR (KITS) ×3 IMPLANT
LABEL OR SOLS (LABEL) ×3 IMPLANT
NEEDLE FILTER BLUNT 18X 1/2SAF (NEEDLE) ×2
NEEDLE FILTER BLUNT 18X1 1/2 (NEEDLE) ×1 IMPLANT
NEEDLE HYPO 25X1 1.5 SAFETY (NEEDLE) ×6 IMPLANT
NS IRRIG 500ML POUR BTL (IV SOLUTION) ×3 IMPLANT
PACK EXTREMITY ARMC (MISCELLANEOUS) ×3 IMPLANT
PAD ABD DERMACEA PRESS 5X9 (GAUZE/BANDAGES/DRESSINGS) ×15 IMPLANT
STOCKINETTE STRL 6IN 960660 (GAUZE/BANDAGES/DRESSINGS) ×3 IMPLANT
SUT ETHILON 3-0 FS-10 30 BLK (SUTURE) ×3
SUT ETHILON 4-0 (SUTURE) ×2
SUT ETHILON 4-0 FS2 18XMFL BLK (SUTURE) ×1
SUT VIC AB 3-0 SH 27 (SUTURE) ×2
SUT VIC AB 3-0 SH 27X BRD (SUTURE) ×1 IMPLANT
SUT VIC AB 4-0 FS2 27 (SUTURE) ×3 IMPLANT
SUTURE EHLN 3-0 FS-10 30 BLK (SUTURE) ×1 IMPLANT
SUTURE ETHLN 4-0 FS2 18XMF BLK (SUTURE) ×1 IMPLANT
SYR 3ML LL SCALE MARK (SYRINGE) IMPLANT
SYRINGE 10CC LL (SYRINGE) ×3 IMPLANT
WND VAC CANISTER 500ML (MISCELLANEOUS) ×3 IMPLANT

## 2015-11-24 NOTE — Anesthesia Preprocedure Evaluation (Signed)
Anesthesia Evaluation  Patient identified by MRN, date of birth, ID band Patient awake    Reviewed: Allergy & Precautions, H&P , NPO status , Patient's Chart, lab work & pertinent test results, reviewed documented beta blocker date and time   Airway Mallampati: III  TM Distance: >3 FB Neck ROM: full    Dental no notable dental hx. (+) Teeth Intact, Poor Dentition   Pulmonary neg pulmonary ROS,    Pulmonary exam normal breath sounds clear to auscultation       Cardiovascular Exercise Tolerance: Good hypertension, + CAD, + Peripheral Vascular Disease and +CHF  negative cardio ROS   Rhythm:regular Rate:Normal     Neuro/Psych TIAnegative neurological ROS  negative psych ROS   GI/Hepatic negative GI ROS, Neg liver ROS,   Endo/Other  negative endocrine ROSdiabetesHypothyroidism   Renal/GU      Musculoskeletal   Abdominal   Peds  Hematology negative hematology ROS (+)   Anesthesia Other Findings   Reproductive/Obstetrics negative OB ROS                             Anesthesia Physical Anesthesia Plan  ASA: III and emergent  Anesthesia Plan: MAC   Post-op Pain Management:    Induction:   Airway Management Planned:   Additional Equipment:   Intra-op Plan:   Post-operative Plan:   Informed Consent: I have reviewed the patients History and Physical, chart, labs and discussed the procedure including the risks, benefits and alternatives for the proposed anesthesia with the patient or authorized representative who has indicated his/her understanding and acceptance.     Plan Discussed with: CRNA  Anesthesia Plan Comments:         Anesthesia Quick Evaluation

## 2015-11-24 NOTE — Op Note (Signed)
Operative note   Surgeon: Dr. Recardo EvangelistMatthew Raenell Mensing, DPM.    Assistant: None    Preop diagnosis: Decubitus ulcer with necrotic eschar posterior left heel. Wound eschar measures 8 x 4 cm    Postop diagnosis: Same    Procedure:   1. Excisional debridement of necrotic eschar from posterior left heel ulceration         EBL: 20 cc    Anesthesia:IV sedation given by the anesthesia team and local block consisting of 7 cc of lidocaine plain 1% and Marcaine 0.5% mixed 50-50.    Hemostasis: Ankle tourniquet 250 mmHg pressure for 15 minutes    Specimen: None    Complications: None    Operative indications: Decubitus ulceration developed over the last several weeks with expansion from a previous ulceration on the medial posterior left heel    Procedure:  Patient was brought into the OR and placed on the operating table in thesupine position. After anesthesia was obtained theleft lower extremity was prepped and draped in usual sterile fashion.  Operative Report: At This time attention directed to the left heel where the large hard necrotic eschar was noted on the posterior ulceration. A 15 blade was used to remove the thickest most dense portion of this. Once the eschar was removed a versa jet set on level IV was utilized to remove all remaining necrotic tissue from the wound. Once all necrotic and dead tissue was eliminated and good bleeding was occurring. The tourniquet was released and good bleeding occurred throughout the area. At this time a sponge for a wound VAC was placed on the area and set at 1 25 mmHg pressure continuous. Extra padding including ABDs pads and Kerlix were placed across the left foot and addition some extra padding was placed on the right foot.    Patient tolerated the procedure and anesthesia well.  Was transported from the OR to the PACU with all vital signs stable and vascular status intact. We will follow the patient in the Hospital.

## 2015-11-24 NOTE — Transfer of Care (Signed)
Immediate Anesthesia Transfer of Care Note  Patient: [name]  Procedure(s) Performed: Procedure(s): IRRIGATION AND DEBRIDEMENT FOOT (Left)  Patient Location: PACU  Anesthesia Type:MAC  Level of Consciousness: awake and alert   Airway & Oxygen Therapy: Patient Spontanous Breathing and Patient connected to face mask oxygen  Post-op Assessment: Report given to RN and Post -op Vital signs reviewed and stable  Post vital signs: Reviewed and stable  Last Vitals:  Filed Vitals:   11/24/15 0432 11/24/15 1038  BP: 115/64 107/54  Pulse: 71 71  Temp: 36.5 C 36.4 C  Resp: 18 16    Last Pain:  Filed Vitals:   11/24/15 1302  PainSc: 0-No pain         Complications: No apparent anesthesia complications

## 2015-11-24 NOTE — Progress Notes (Signed)
[facility] [place] [facility]   PATIENT NAME: Chris Ridinglmer Colantonio    MR#:  [id number]  DATE OF BIRTH:  May 15, 1925  SUBJECTIVE:  CHIEF COMPLAINT:   Chief Complaint  Patient presents with  . Foot Injury   - admitted with left heel ulcer- recent left first ray amputation, for left heel necrotic eschar and ulcer debridement - Daughter at bedside - no other complaints  REVIEW OF SYSTEMS:  Review of Systems  Constitutional: Positive for malaise/fatigue. Negative for fever and chills.  HENT: Positive for hearing loss. Negative for ear discharge, ear pain and nosebleeds.   Eyes: Negative for blurred vision and double vision.  Respiratory: Negative for cough, shortness of breath and wheezing.   Cardiovascular: Negative for chest pain, palpitations and leg swelling.  Gastrointestinal: Negative for nausea, vomiting, abdominal pain, diarrhea and constipation.  Genitourinary: Negative for dysuria and hematuria.  Musculoskeletal: Positive for myalgias.  Neurological: Negative for dizziness, speech change, focal weakness, seizures and headaches.  Psychiatric/Behavioral: Negative for depression.    DRUG ALLERGIES:  No Known Allergies  VITALS:  Blood pressure 119/64, pulse 71, temperature 98.2 F (36.8 C), temperature source Oral, resp. rate 16, height 6' (1.829 m), weight 82.555 kg (182 lb), SpO2 98 %.  PHYSICAL EXAMINATION:  Physical Exam  GENERAL:  82 y.o.-year-old elderly patient lying in the bed with no acute distress.  EYES: Pupils equal, round, reactive to light and accommodation. No scleral icterus. Extraocular muscles intact.  HEENT: Head atraumatic, normocephalic. Oropharynx and nasopharynx clear.  NECK:  Supple, no jugular venous distention. No thyroid enlargement, no tenderness.  LUNGS: Normal breath sounds bilaterally, no wheezing, rales,rhonchi or crepitation. No use of accessory muscles of respiration. Decreased bibasilar breath  sounds CARDIOVASCULAR: S1, S2 normal. No rubs, or gallops. 3/6 systolic murmur present ABDOMEN: Soft, nontender, nondistended. Bowel sounds present. No organomegaly or mass.  EXTREMITIES: Left foot in dressing, both in heel floats, likely fungal nail infection of hand nails NEUROLOGIC: Cranial nerves II through XII are intact. Muscle strength 5/5 in all extremities. Sensation intact. Gait not checked.  PSYCHIATRIC: The patient is alert and oriented x 3.  SKIN: No obvious rash, lesion, or ulcer.    LABORATORY PANEL:   CBC  Recent Labs Lab 11/23/15 0541  WBC 9.3  HGB 11.9*  HCT 34.8*  PLT 387   ------------------------------------------------------------------------------------------------------------------  Chemistries   Recent Labs Lab 11/23/15 0541  NA 139  K 3.8  CL 103  CO2 28  GLUCOSE 157*  BUN 20  CREATININE 1.18  CALCIUM 8.7*  MG 1.9   ------------------------------------------------------------------------------------------------------------------  Cardiac Enzymes No results for input(s): TROPONINI in the last 168 hours. ------------------------------------------------------------------------------------------------------------------  RADIOLOGY:  No results found.  EKG:   Orders placed or performed in visit on 10/28/15  . EKG 12-Lead    ASSESSMENT AND PLAN:   82 year old male with past medical history of peripheral vascular disease, coronary artery disease, diabetes type 2 without, patient, history of aortic stenosis, hypertension, hyperlipidemia, history of previous TIA, hypothyroidism admitted from [facility] with left heel ulcer  1. Left heel ulcer with recent left first ray amputation- has a PICC line and was on IV ABX at the rehab -Appreciate podiatry and vascular consults. -s/p angiogram of left leg with angioplasty done. Scheduled for debridement of that left heel ulcer by Podiatry today. - Continue empiric antibiotics with  vancomycin and Zosyn- had MRSA and proteus in the wound 2 weeks ago - ID consulted for IV ABX - will need  right leg angiogram and may be debridement of the pressure ulcer as well  2. History of Parkinson's disease-continue Sinemet.  3. Atrial fibrillation-on amiodarone. Continue low-dose aspirin. Rate controlled  4. Hypothyroidism-continue Synthroid.  5. Chronic venous insufficiency-patient is on Lasix at home. Decreased the dose. Leg wrappings, keep the legs elevated if needed. Last echo from February 2016 with normal ejection fraction.  6. Restless leg syndrome-continue Mirapex.   7. BPH-continue Flomax.  8. Hypothyroidism-continue Synthroid.   Physical therapy consulted- needs rehab [facility]   All the records are reviewed and case discussed with Care Management/Social Workerr. Management plans discussed with the patient, family and they are in agreement.  CODE STATUS: DNR  TOTAL TIME TAKING CARE OF THIS PATIENT: 33 minutes.   POSSIBLE D/C IN 2 DAYS, DEPENDING ON CLINICAL CONDITION.   Enid BaasKALISETTI,Alica Shellhammer M.D on 11/24/2015 at 1:56 PM  Between 7am to 6pm - Pager - [phone]  After 6pm go to [email or web address] - password [email or web address] [facility]  [facility]  Office  [phone]  CC: Primary care physician; Tonette LedererKIMBERLY A STEGMAYER, PA-C

## 2015-11-24 NOTE — Care Management Important Message (Signed)
Important Message  Patient Details  Name: [name] MRN: [id number] Date of Birth: 1924-12-23   Medicare Important Message Given:  Yes    Collie SiadAngela Sharne Linders, RN 11/24/2015, 7:48 AM

## 2015-11-24 NOTE — Progress Notes (Signed)
[facility]  Daily Progress Note   Subjective: 1 Day Post-Op: Introduction catheter into left lower extremity 3rd order catheter placement, Contrast injection left lower extremity for distal runoff, Percutaneous transluminal angioplasty left posterior tibial artery to 3 mm with Star close closure right common femoral arteriotomy.  Patient without complaint this AM. Denies any pain.   Objective: Filed Vitals:   11/23/15 1710 11/23/15 1715 11/23/15 2100 11/24/15 0432  BP:   113/70 115/64  Pulse: 78 78 80 71  Temp:   97.5 F (36.4 C) 97.7 F (36.5 C)  TempSrc:   Oral Oral  Resp: 19 14 16 18   Height:      Weight:      SpO2: 98% 99% 96% 93%    Intake/Output Summary (Last 24 hours) at 11/24/15 0748 Last data filed at 11/24/15 0737  Gross per 24 hour  Intake      0 ml  Output    300 ml  Net   -300 ml   Physical Exam: A&Ox3, NAD CV: RRR Pulmonary: CTA Bilaterally Abdomen: Soft, Nontender, Nondistended Vascular:  Right Groin: Procedure dressing in place. Minimal drainage. No swelling noted.  Left Lower Extremity: Warm, foot dressed and in boot.   Laboratory: CBC    Component Value Date/Time   WBC 9.3 11/23/2015 0541   WBC 10.0 09/27/2015 1506   HGB 11.9* 11/23/2015 0541   HGB 13.5 07/18/2013 0356   HCT 34.8* 11/23/2015 0541   HCT 41.8 09/27/2015 1506   PLT 387 11/23/2015 0541   PLT 187 09/27/2015 1506   PLT 148* 07/18/2013 0356   BMET    Component Value Date/Time   NA 139 11/23/2015 0541   NA 142 09/27/2015 1506   NA 139 10/30/2013 0715   K 3.8 11/23/2015 0541   K 4.2 10/30/2013 0715   CL 103 11/23/2015 0541   CL 104 10/30/2013 0715   CO2 28 11/23/2015 0541   CO2 29 10/30/2013 0715   GLUCOSE 157* 11/23/2015 0541   GLUCOSE 175* 09/27/2015 1506   GLUCOSE 162* 10/30/2013 0715   BUN 20 11/23/2015 0541   BUN 24 09/27/2015 1506   BUN 16 10/30/2013 0715   CREATININE 1.18 11/23/2015 0541   CREATININE 1.15 10/30/2013 0715   CREATININE 1.34  10/21/2012 1057   CALCIUM 8.7* 11/23/2015 0541   CALCIUM 9.2 10/30/2013 0715   GFRNONAA 53* 11/23/2015 0541   GFRNONAA 57* 10/30/2013 0715   GFRAA >60 11/23/2015 0541   GFRAA >60 10/30/2013 0715   Assessment/Planning: 82 year old male s/p left lower extremity angiogram with intervention for atherosclerotic disease with heel gangrene - doing well this AM 1) Following angioplasty of the posterior tibial there is now in-line flow to the foot and good filling of the pedal arch and lateral and medial plantar vessels.   Cleda DaubKimberly Kijana Estock PA-C 11/24/2015 7:48 AM

## 2015-11-24 NOTE — Progress Notes (Signed)
Clinical Education officer, museum (CSW) met with patient and his daughter at bedside to follow up on D/C plan. Patient and daughter are agreeable to go to [facility]. Per MD patient will likely be ready on Monday. CSW made [facility] admissions coordinator at [facility] aware of above. CSW also made [facility] Team case manager aware of above. CSW will continue to follow and assist as needed.   Blima Rich, LCSW [phone]

## 2015-11-24 NOTE — Progress Notes (Signed)
ANTIBIOTIC CONSULT NOTE - Follow up  Pharmacy Consult for Vancomycin/Zosyn  Indication: wound infection  No Known Allergies  Patient Measurements: Height: 6' (182.9 cm) Weight: 182 lb (82.555 kg) IBW/kg (Calculated) : 77.6    Vital Signs: Temp: 97.7 F (36.5 C) (06/22 0432) Temp Source: Oral (06/22 0432) BP: 115/64 mmHg (06/22 0432) Pulse Rate: 71 (06/22 0432) Intake/Output from previous day:   Intake/Output from this shift:    Labs:  Recent Labs  11/21/15 1236 11/22/15 0340 11/23/15 0541  WBC 9.7 9.1 9.3  HGB 11.8* 11.7* 11.9*  PLT 420 411 387  CREATININE 1.12 1.18 1.18   Estimated Creatinine Clearance: 47.5 mL/min (by C-G formula based on Cr of 1.18).  Recent Labs  11/21/15 1813  11/23/15 0541 11/24/15 0645  VANCOTROUGH  --   < > 37* 17  VANCORANDOM 30  --   --   --   < > = values in this interval not displayed.   Microbiology: Recent Results (from the past 720 hour(s))  WOUND CULTURE ([facility] ONLY)     Status: None   Collection Time: 11/01/15  1:20 PM  Result Value Ref Range Status   Specimen Description HEEL  Final   Special Requests LEFT  Final   Gram Stain RARE WBC SEEN NO ORGANISMS SEEN   Final   Culture   Final    LIGHT GROWTH PROTEUS MIRABILIS LIGHT GROWTH METHICILLIN RESISTANT STAPHYLOCOCCUS AUREUS MODERATE GROWTH ENTEROCOCCUS FAECALIS    Report Status 11/09/2015 FINAL  Final   Organism ID, Bacteria PROTEUS MIRABILIS  Final   Organism ID, Bacteria METHICILLIN RESISTANT STAPHYLOCOCCUS AUREUS  Final   Organism ID, Bacteria ENTEROCOCCUS FAECALIS  Final      Susceptibility   Proteus mirabilis - MIC*    AMPICILLIN <=2 SENSITIVE Sensitive     CEFAZOLIN <=4 SENSITIVE Sensitive     CEFEPIME <=1 SENSITIVE Sensitive     CEFTAZIDIME <=1 SENSITIVE Sensitive     CEFTRIAXONE <=1 SENSITIVE Sensitive     CIPROFLOXACIN <=0.25 SENSITIVE Sensitive     GENTAMICIN <=1 SENSITIVE Sensitive     IMIPENEM 2 SENSITIVE Sensitive     TRIMETH/SULFA <=20 SENSITIVE  Sensitive     AMPICILLIN/SULBACTAM <=2 SENSITIVE Sensitive     PIP/TAZO <=4 SENSITIVE Sensitive     * LIGHT GROWTH PROTEUS MIRABILIS   Enterococcus faecalis - MIC*    AMPICILLIN SENSITIVE Sensitive     LINEZOLID SENSITIVE Sensitive     * MODERATE GROWTH ENTEROCOCCUS FAECALIS   Methicillin resistant staphylococcus aureus - MIC*    CIPROFLOXACIN >=8 RESISTANT Resistant     ERYTHROMYCIN >=8 RESISTANT Resistant     GENTAMICIN <=0.5 SENSITIVE Sensitive     OXACILLIN >=4 RESISTANT Resistant     TETRACYCLINE 2 SENSITIVE Sensitive     VANCOMYCIN 1 SENSITIVE Sensitive     TRIMETH/SULFA <=10 SENSITIVE Sensitive     CLINDAMYCIN >=8 RESISTANT Resistant     RIFAMPIN <=0.5 SENSITIVE Sensitive     Inducible Clindamycin NEGATIVE Sensitive     * LIGHT GROWTH METHICILLIN RESISTANT STAPHYLOCOCCUS AUREUS  Surgical pcr screen     Status: Abnormal   Collection Time: 11/04/15  6:11 PM  Result Value Ref Range Status   MRSA, PCR POSITIVE (A) NEGATIVE Final    Comment: CRITICAL RESULT CALLED TO, READ BACK BY AND VERIFIED WITH: MARY RAYMOND AT 2324 11/04/15.PMH    Staphylococcus aureus POSITIVE (A) NEGATIVE Final    Comment:        The Xpert SA Assay (FDA approved for  NASAL specimens in patients over 52 years of age), is one component of a comprehensive surveillance program.  Test performance has been validated by [facility] for patients greater than or equal to 71 year old. It is not intended to diagnose infection nor to guide or monitor treatment.   Anaerobic culture     Status: None   Collection Time: 11/05/15 12:17 PM  Result Value Ref Range Status   Specimen Description WOUND  Final   Special Requests NONE  Final   Culture NO ANAEROBES ISOLATED  Final   Report Status 11/11/2015 FINAL  Final  WOUND CULTURE ([facility] ONLY)     Status: None   Collection Time: 11/05/15 12:17 PM  Result Value Ref Range Status   Specimen Description WOUND  Final   Special Requests NONE  Final   Gram Stain   Final     FEW RED BLOOD CELLS RARE WBC SEEN NO ORGANISMS SEEN    Culture   Final    LIGHT GROWTH PROTEUS MIRABILIS LIGHT GROWTH STAPHYLOCOCCUS AUREUS    Report Status 11/08/2015 FINAL  Final   Organism ID, Bacteria PROTEUS MIRABILIS  Final   Organism ID, Bacteria PROTEUS MIRABILIS  Final   Organism ID, Bacteria STAPHYLOCOCCUS AUREUS  Final   Organism ID, Bacteria STAPHYLOCOCCUS AUREUS  Final      Susceptibility   Staphylococcus aureus - MIC*    CEFAZOLIN RESISTANT Resistant     LEVOFLOXACIN RESISTANT Resistant     OXACILLIN RESISTANT Resistant     TRIMETH/SULFA SENSITIVE Sensitive     VANCOMYCIN SENSITIVE Sensitive     TETRACYCLINE SENSITIVE Sensitive    Staphylococcus aureus - MIC*    CIPROFLOXACIN >=8 RESISTANT Resistant     ERYTHROMYCIN >=8 RESISTANT Resistant     GENTAMICIN <=0.5 SENSITIVE Sensitive     OXACILLIN >=4 RESISTANT Resistant     TETRACYCLINE <=1 SENSITIVE Sensitive     VANCOMYCIN 1 SENSITIVE Sensitive     TRIMETH/SULFA <=10 SENSITIVE Sensitive     CLINDAMYCIN >=8 RESISTANT Resistant     RIFAMPIN <=0.5 SENSITIVE Sensitive     Inducible Clindamycin NEGATIVE Sensitive     * LIGHT GROWTH STAPHYLOCOCCUS AUREUS    LIGHT GROWTH STAPHYLOCOCCUS AUREUS   Proteus mirabilis - MIC*    AMPICILLIN RESISTANT Resistant     AMPICILLIN/SULBACTAM RESISTANT Resistant     CEFAZOLIN RESISTANT Resistant     CIPROFLOXACIN SENSITIVE Sensitive     GENTAMICIN SENSITIVE Sensitive     IMIPENEM SENSITIVE Sensitive     TRIMETH/SULFA SENSITIVE Sensitive    Proteus mirabilis - MIC*    AMPICILLIN >=32 RESISTANT Resistant     CEFAZOLIN >=64 RESISTANT Resistant     CEFEPIME <=1 RESISTANT Resistant     CEFTAZIDIME 16 RESISTANT Resistant     CEFTRIAXONE 8 RESISTANT Resistant     CIPROFLOXACIN <=0.25 SENSITIVE Sensitive     GENTAMICIN <=1 SENSITIVE Sensitive     IMIPENEM 1 SENSITIVE Sensitive     TRIMETH/SULFA <=20 SENSITIVE Sensitive     AMPICILLIN/SULBACTAM >=32 RESISTANT Resistant      PIP/TAZO <=4 SENSITIVE Sensitive     * LIGHT GROWTH PROTEUS MIRABILIS    LIGHT GROWTH PROTEUS MIRABILIS  Blood culture (routine x 2)     Status: None (Preliminary result)   Collection Time: 11/21/15 12:37 PM  Result Value Ref Range Status   Specimen Description BLOOD LEFT HAND  Final   Special Requests   Final    BOTTLES DRAWN AEROBIC AND ANAEROBIC  AERO 10CC ANA 7CC  Culture NO GROWTH 1 DAY  Final   Report Status PENDING  Incomplete  Blood culture (routine x 2)     Status: None (Preliminary result)   Collection Time: 11/21/15 12:37 PM  Result Value Ref Range Status   Specimen Description BLOOD RIGHT FATTY CASTS  Final   Special Requests BOTTLES DRAWN AEROBIC AND ANAEROBIC  1CC  Final   Culture NO GROWTH 1 DAY  Final   Report Status PENDING  Incomplete    Assessment: Pharmacy consulted to dose vanc and zosyn in this [id number] year old male admitted [facility] with infected heel ulcer.  Pt was being treated at [facility] with Vanc and Zosyn but pt's condition continued to worsen.   Will draw random Vanc level on 6/19 @ 16:00.   CrCl = 51.3 ml/min Ke = 0.047 hr-1 T1/2 = 14.7 hrs VD = 57.8 L   Pt received Vanc 1 g IV X 1 in ED on 6/19 @ 12:42. 6/19 :  Random Vanc level @ 1813 = 30 mcg/mL   6/20:  Vanc trough @ 18:45 = 27 mcg/mL.   Level was drawn @ 18:45 but vanc dose was hung @ 18:26.  Therefore, this is not a true trough.  Will reorder Vanc trough before next dose on 6/21 @ 5:30.    Goal of Therapy:  Vancomycin trough level 15-20 mcg/ml  Plan:  Current order for Zosyn 4.5 gm IV Q8H EI ordered to start on 6/19 @ 20:00. Will transition to Zosyn 3.375 g IV q8h EI as continuation of regimen pt was on at [facility].   Vanc level on 6/19 @ 18:00 was 30 mcg/mL. Therefore, will wait 12 hrs ( approximately 1 half-life) before resuming vancomycin.  Vancomycin 1 gm IV Q12H ordered to start 6/20 @ 6:00.  6/20: Will get a vanc level before dose this evening to see where  we are and to ensure that pt is clearing doses. This level will not be at steady state. If level <20, likely continue vanc 1 g IV q12h and recheck level after a couple more doses.   Pharmacy will continue to follow.   Olene FlossMelissa D Magenta Schmiesing 11/24/2015,7:22 AM   Addendum: [facility] spoke with April, RPh [phone]): - vancomycin 750 mg q12h from 6/8 to 6/12 - Vanc trough 6/12 of 11.5, dose increased to vanc 1g IV q12 - Vanc trough 6/15 of 15.8 (Scr 1.18) before 3rd dose of vanc 1 g IV q12h - Pt got both doses (0900 and 2100) on Sun 6/18 but does not appear to have gotten 6/19 0900 dose before [facility] facility [facility]   11/23/2015 0623 lab reports vancomycin trough 37 mcg/mL. RN says she has not administered dose yet this AM. Renal function approximately stable. New Ke 0.033 hr-1, predicted trough at 0600 tomorrow 16 mcg/mL. Will order random for 0600 tomorrow and redose if appropriate.  6/22 0645 Vancomycin level 17. Last dose of vancomycin 1g 6/20 @ 1826. Will resume vancomycin at reduced dose of vancomycin 1000mg  q 36 hours. Will check trough at new steady state prior to 4th dose 6/26 @ 1900.   Olene FlossMelissa D Lile Mccurley, Pharm.D Clinical Pharmacist

## 2015-11-24 NOTE — Anesthesia Procedure Notes (Signed)
Procedure Name: MAC Date/Time: 11/24/2015 1:04 PM Performed by: Ginger CarneMICHELET, Kailany Dinunzio Pre-anesthesia Checklist: Patient identified, Emergency Drugs available, Suction available, Patient being monitored and Timeout performed Patient Re-evaluated:Patient Re-evaluated prior to inductionOxygen Delivery Method: Simple face mask Preoxygenation: Pre-oxygenation with 100% oxygen

## 2015-11-24 NOTE — Anesthesia Postprocedure Evaluation (Signed)
Anesthesia Post Note  Patient: [name]  Procedure(s) Performed: Procedure(s) (LRB): IRRIGATION AND DEBRIDEMENT FOOT (Left)  Patient location during evaluation: PACU Anesthesia Type: General Level of consciousness: awake and alert Pain management: pain level controlled Vital Signs Assessment: post-procedure vital signs reviewed and stable Respiratory status: spontaneous breathing, nonlabored ventilation, respiratory function stable and patient connected to nasal cannula oxygen Cardiovascular status: blood pressure returned to baseline and stable Postop Assessment: no signs of nausea or vomiting Anesthetic complications: no    Last Vitals:  Filed Vitals:   11/24/15 1349 11/24/15 1350  BP:  119/64  Pulse:    Temp: 36.8 C   Resp:      Last Pain:  Filed Vitals:   11/24/15 1355  PainSc: 0-No pain                 Yevette EdwardsJames G Yuleni Burich

## 2015-11-24 NOTE — Consult Note (Signed)
[facility] Infectious Disease     Reason for Consult: Diabetic foot infection with osteomyeltis Referring Physician: Ulysees Barns Date of Admission:  11/21/2015   Active Problems:   Heel ulcer ([place])   HPI: [name] is a 82 y.o. male with DM, PVD s/p revascularization readmitted 6/19 with worsening of chronic wound onL heel ulcer. He is s/p surgical amputatton first ray and abx placement 6/3, prior cx with MRSA, enterococcus and proteus. Recently dced on vanco and zosyn via PICC Has seen podiatry and has improvement in recent surgical site on first ray but woseneing heel and will need repeat surgery and wound vac.  On admit wbc 9, no fevers.   Past Medical History  Diagnosis Date  . Coronary artery disease   . Diabetes mellitus without complication ([place])   . Aortic stenosis   . Hypertension   . Hyperlipidemia   . TIA (transient ischemic attack)   . Thyroid disease   . Hypothyroidism    Past Surgical History  Procedure Laterality Date  . Appendectomy    . Hernia repair    . Knee surgery      bilateral  . Eye surgery    . Colonoscopy    . Cardiac catheterization  2010    showed a patient circumflex stent with noncritical CAD and normal right-sided pressures.  . Coronary angioplasty  01/24/2007    stent placement to the mid circumflex   . Knee surgery Left   . Peripheral vascular catheterization N/A 08/30/2015    Procedure: Abdominal Aortogram w/Lower Extremity;  Surgeon: Katha Cabal, MD;  Location: [place] [facility];  Service: Cardiovascular;  Laterality: N/A;  . Peripheral vascular catheterization  08/30/2015    Procedure: Lower Extremity Intervention;  Surgeon: Katha Cabal, MD;  Location: [place] [facility];  Service: Cardiovascular;;  . Amputation toe Left 11/05/2015    Procedure: AMPUTATION TOE;  Surgeon: Albertine Patricia, DPM;  Location: [facility];  Service: Podiatry;  Laterality: Left;  . Irrigation and debridement foot Left 11/05/2015    Procedure:  IRRIGATION AND DEBRIDEMENT FOOT / HEEL ULCER;  Surgeon: Albertine Patricia, DPM;  Location: [facility];  Service: Podiatry;  Laterality: Left;  . Application of wound vac Left 11/05/2015    Procedure: APPLICATION OF WOUND VAC;  Surgeon: Albertine Patricia, DPM;  Location: [facility];  Service: Podiatry;  Laterality: Left;  . Peripheral vascular catheterization Left 11/23/2015    Procedure: Abdominal Aortogram w/Lower Extremity;  Surgeon: Katha Cabal, MD;  Location: [place] [facility];  Service: Cardiovascular;  Laterality: Left;  . Peripheral vascular catheterization  11/23/2015    Procedure: Lower Extremity Intervention;  Surgeon: Katha Cabal, MD;  Location: [place] [facility];  Service: Cardiovascular;;   Social History  Substance Use Topics  . Smoking status: Never Smoker   . Smokeless tobacco: Never Used  . Alcohol Use: No   Family History  Problem Relation Age of Onset  . Heart disease Mother   . Heart attack Father 13    MI  . Heart attack Brother 64    MI    Allergies: No Known Allergies  Current antibiotics: Antibiotics Given (last 72 hours)    Date/Time Action Medication Dose Rate   11/21/15 2327 Given   piperacillin-tazobactam (ZOSYN) IVPB 4.5 g 4.5 g 25 mL/hr   11/22/15 0659 Given   piperacillin-tazobactam (ZOSYN) IVPB 4.5 g 4.5 g 25 mL/hr   11/22/15 0659 Given   vancomycin (VANCOCIN) IVPB 1000 mg/200 mL premix  1,000 mg 200 mL/hr   11/22/15 1456 Given   piperacillin-tazobactam (ZOSYN) IVPB 3.375 g 3.375 g 12.5 mL/hr   11/22/15 1826 Given   vancomycin (VANCOCIN) IVPB 1000 mg/200 mL premix 1,000 mg 200 mL/hr   11/22/15 2126 Given   piperacillin-tazobactam (ZOSYN) IVPB 3.375 g 3.375 g 12.5 mL/hr   11/23/15 0552 Given   piperacillin-tazobactam (ZOSYN) IVPB 3.375 g 3.375 g 12.5 mL/hr   11/23/15 1400 Given   cefUROXime (ZINACEF) 1.5 g in dextrose 5 % 50 mL IVPB 1.5 g 100 mL/hr   11/23/15 2040 Given   piperacillin-tazobactam (ZOSYN) IVPB 3.375 g 3.375 g 12.5 mL/hr    11/24/15 0529 Given   piperacillin-tazobactam (ZOSYN) IVPB 3.375 g 3.375 g 12.5 mL/hr   11/24/15 0935 Given   vancomycin (VANCOCIN) IVPB 1000 mg/200 mL premix 1,000 mg 200 mL/hr      MEDICATIONS: . amiodarone  100 mg Oral BID  . aspirin EC  81 mg Oral Daily  . carbidopa-levodopa  1 tablet Oral QID  . clopidogrel  300 mg Oral STAT  . clopidogrel  75 mg Oral Daily  . colchicine  0.6 mg Oral Daily  . cyanocobalamin  1,000 mcg Intramuscular Q30 days  . enoxaparin (LOVENOX) injection  40 mg Subcutaneous Q24H  . feeding supplement (GLUCERNA SHAKE)  237 mL Oral TID BM  . furosemide  40 mg Oral Daily  . insulin aspart  0-5 Units Subcutaneous QHS  . insulin aspart  0-9 Units Subcutaneous TID WC  . levothyroxine  150 mcg Oral QAC breakfast  . linagliptin  5 mg Oral Daily  . piperacillin-tazobactam (ZOSYN)  IV  3.375 g Intravenous Q8H  . potassium chloride  20 mEq Oral BID  . pramipexole  0.5 mg Oral TID  . tamsulosin  0.4 mg Oral QPC supper  . terbinafine   Topical BID  . vancomycin  1,000 mg Intravenous Q36H    Review of Systems - 11 systems reviewed and negative per HPI   OBJECTIVE: Temp:  [97 F (36.1 C)-97.7 F (36.5 C)] 97.7 F (36.5 C) (06/22 0432) Pulse Rate:  [63-81] 71 (06/22 0432) Resp:  [0-27] 18 (06/22 0432) BP: (113-138)/(63-82) 115/64 mmHg (06/22 0432) SpO2:  [88 %-100 %] 93 % (06/22 0432) Physical Exam  Constitutional: He is oriented to person, place, and time. Frail appearing, hoh HENT: anicteric Mouth/Throat: Oropharynx is clear and moist. No oropharyngeal exudate.  Cardiovascular: Normal rate, regular rhythm and normal heart sounds.Pulmonary/Chest: Effort normal and breath sounds normal. No respiratory distress. He has no wheezes.  Abdominal: Soft. Bowel sounds are normal. He exhibits no distension. There is no tenderness.  Lymphadenopathy:  He has no cervical adenopathy.  Neurological: He is alert and oriented to person, place, and time.  Skin: Skin is  warm and dry. No rash noted. No erythema.  Psychiatric: He has a normal mood and affect. His behavior is normal.  L foot s/p surgery with well approximated medial incision with no drainage, sutures in place. Heel wrapped   LABS: Results for orders placed or performed during the hospital encounter of 11/21/15 (from the past 48 hour(s))  Glucose, capillary     Status: Abnormal   Collection Time: 11/22/15 11:10 AM  Result Value Ref Range   Glucose-Capillary 266 (H) 65 - 99 mg/dL   Comment 1 Notify RN   Glucose, capillary     Status: Abnormal   Collection Time: 11/22/15  4:06 PM  Result Value Ref Range   Glucose-Capillary 213 (H) 65 - 99 mg/dL  Vancomycin, trough     Status: Abnormal   Collection Time: 11/22/15  6:45 PM  Result Value Ref Range   Vancomycin Tr 27 (HH) 10 - 20 ug/mL    Comment: CRITICAL RESULT CALLED TO, READ BACK BY AND VERIFIED WITH JASON ROBBINS [place] AT 1930 11/22/15 MSS.   Glucose, capillary     Status: Abnormal   Collection Time: 11/22/15  8:40 PM  Result Value Ref Range   Glucose-Capillary 208 (H) 65 - 99 mg/dL  CBC     Status: Abnormal   Collection Time: 11/23/15  5:41 AM  Result Value Ref Range   WBC 9.3 3.8 - 10.6 K/uL   RBC 3.91 (L) 4.40 - 5.90 MIL/uL   Hemoglobin 11.9 (L) 13.0 - 18.0 g/dL   HCT 34.8 (L) 40.0 - 52.0 %   MCV 89.1 80.0 - 100.0 fL   MCH 30.5 26.0 - 34.0 pg   MCHC 34.2 32.0 - 36.0 g/dL   RDW 15.6 (H) 11.5 - 14.5 %   Platelets 387 150 - 440 K/uL  Basic metabolic panel     Status: Abnormal   Collection Time: 11/23/15  5:41 AM  Result Value Ref Range   Sodium 139 135 - 145 mmol/L   Potassium 3.8 3.5 - 5.1 mmol/L   Chloride 103 101 - 111 mmol/L   CO2 28 22 - 32 mmol/L   Glucose, Bld 157 (H) 65 - 99 mg/dL   BUN 20 6 - 20 mg/dL   Creatinine, Ser 1.18 0.61 - 1.24 mg/dL   Calcium 8.7 (L) 8.9 - 10.3 mg/dL   GFR calc non Af Amer 53 (L) >60 mL/min   GFR calc Af Amer >60 >60 mL/min    Comment: (NOTE) The eGFR has been calculated using the CKD  EPI equation. This calculation has not been validated in all clinical situations. eGFR's persistently <60 mL/min signify possible Chronic Kidney Disease.    Anion gap 8 5 - 15  Magnesium     Status: None   Collection Time: 11/23/15  5:41 AM  Result Value Ref Range   Magnesium 1.9 1.7 - 2.4 mg/dL  Vancomycin, trough     Status: Abnormal   Collection Time: 11/23/15  5:41 AM  Result Value Ref Range   Vancomycin Tr 37 (HH) 10 - 20 ug/mL    Comment: CRITICAL RESULT CALLED TO, READ BACK BY AND VERIFIED WITH CALLED NATHANCOOKSON@0617  ON 11/23/15 BY HKP   Glucose, capillary     Status: Abnormal   Collection Time: 11/23/15  8:21 AM  Result Value Ref Range   Glucose-Capillary 143 (H) 65 - 99 mg/dL   Comment 1 Notify RN   Glucose, capillary     Status: Abnormal   Collection Time: 11/23/15 11:43 AM  Result Value Ref Range   Glucose-Capillary 148 (H) 65 - 99 mg/dL  Glucose, capillary     Status: Abnormal   Collection Time: 11/23/15  9:35 PM  Result Value Ref Range   Glucose-Capillary 154 (H) 65 - 99 mg/dL   Comment 1 Notify RN   Vancomycin, trough     Status: None   Collection Time: 11/24/15  6:45 AM  Result Value Ref Range   Vancomycin Tr 17 10 - 20 ug/mL  Glucose, capillary     Status: Abnormal   Collection Time: 11/24/15  8:06 AM  Result Value Ref Range   Glucose-Capillary 121 (H) 65 - 99 mg/dL   Comment 1 Notify RN    No components found for: ESR,  C REACTIVE PROTEIN MICRO: Recent Results (from the past 720 hour(s))  WOUND CULTURE ([place])     Status: None   Collection Time: 11/01/15  1:20 PM  Result Value Ref Range Status   Specimen Description HEEL  Final   Special Requests LEFT  Final   Gram Stain RARE WBC SEEN NO ORGANISMS SEEN   Final   Culture   Final    LIGHT GROWTH PROTEUS MIRABILIS LIGHT GROWTH METHICILLIN RESISTANT STAPHYLOCOCCUS AUREUS MODERATE GROWTH ENTEROCOCCUS FAECALIS    Report Status 11/09/2015 FINAL  Final   Organism ID, Bacteria PROTEUS MIRABILIS   Final   Organism ID, Bacteria METHICILLIN RESISTANT STAPHYLOCOCCUS AUREUS  Final   Organism ID, Bacteria ENTEROCOCCUS FAECALIS  Final      Susceptibility   Proteus mirabilis - MIC*    AMPICILLIN <=2 SENSITIVE Sensitive     CEFAZOLIN <=4 SENSITIVE Sensitive     CEFEPIME <=1 SENSITIVE Sensitive     CEFTAZIDIME <=1 SENSITIVE Sensitive     CEFTRIAXONE <=1 SENSITIVE Sensitive     CIPROFLOXACIN <=0.25 SENSITIVE Sensitive     GENTAMICIN <=1 SENSITIVE Sensitive     IMIPENEM 2 SENSITIVE Sensitive     TRIMETH/SULFA <=20 SENSITIVE Sensitive     AMPICILLIN/SULBACTAM <=2 SENSITIVE Sensitive     PIP/TAZO <=4 SENSITIVE Sensitive     * LIGHT GROWTH PROTEUS MIRABILIS   Enterococcus faecalis - MIC*    AMPICILLIN SENSITIVE Sensitive     LINEZOLID SENSITIVE Sensitive     * MODERATE GROWTH ENTEROCOCCUS FAECALIS   Methicillin resistant staphylococcus aureus - MIC*    CIPROFLOXACIN >=8 RESISTANT Resistant     ERYTHROMYCIN >=8 RESISTANT Resistant     GENTAMICIN <=0.5 SENSITIVE Sensitive     OXACILLIN >=4 RESISTANT Resistant     TETRACYCLINE 2 SENSITIVE Sensitive     VANCOMYCIN 1 SENSITIVE Sensitive     TRIMETH/SULFA <=10 SENSITIVE Sensitive     CLINDAMYCIN >=8 RESISTANT Resistant     RIFAMPIN <=0.5 SENSITIVE Sensitive     Inducible Clindamycin NEGATIVE Sensitive     * LIGHT GROWTH METHICILLIN RESISTANT STAPHYLOCOCCUS AUREUS  Surgical pcr screen     Status: Abnormal   Collection Time: 11/04/15  6:11 PM  Result Value Ref Range Status   MRSA, PCR POSITIVE (A) NEGATIVE Final    Comment: CRITICAL RESULT CALLED TO, READ BACK BY AND VERIFIED WITH: MARY RAYMOND AT 2324 11/04/15.PMH    Staphylococcus aureus POSITIVE (A) NEGATIVE Final    Comment:        The Xpert SA Assay (FDA approved for NASAL specimens in patients over 59 years of age), is one component of a comprehensive surveillance program.  Test performance has been validated by [facility] for patients greater than or equal to 49 year old. It  is not intended to diagnose infection nor to guide or monitor treatment.   Anaerobic culture     Status: None   Collection Time: 11/05/15 12:17 PM  Result Value Ref Range Status   Specimen Description WOUND  Final   Special Requests NONE  Final   Culture NO ANAEROBES ISOLATED  Final   Report Status 11/11/2015 FINAL  Final  WOUND CULTURE ([facility] ONLY)     Status: None   Collection Time: 11/05/15 12:17 PM  Result Value Ref Range Status   Specimen Description WOUND  Final   Special Requests NONE  Final   Gram Stain   Final    FEW RED BLOOD CELLS RARE WBC SEEN NO ORGANISMS SEEN  Culture   Final    LIGHT GROWTH PROTEUS MIRABILIS LIGHT GROWTH STAPHYLOCOCCUS AUREUS    Report Status 11/08/2015 FINAL  Final   Organism ID, Bacteria PROTEUS MIRABILIS  Final   Organism ID, Bacteria PROTEUS MIRABILIS  Final   Organism ID, Bacteria STAPHYLOCOCCUS AUREUS  Final   Organism ID, Bacteria STAPHYLOCOCCUS AUREUS  Final      Susceptibility   Staphylococcus aureus - MIC*    CEFAZOLIN RESISTANT Resistant     LEVOFLOXACIN RESISTANT Resistant     OXACILLIN RESISTANT Resistant     TRIMETH/SULFA SENSITIVE Sensitive     VANCOMYCIN SENSITIVE Sensitive     TETRACYCLINE SENSITIVE Sensitive    Staphylococcus aureus - MIC*    CIPROFLOXACIN >=8 RESISTANT Resistant     ERYTHROMYCIN >=8 RESISTANT Resistant     GENTAMICIN <=0.5 SENSITIVE Sensitive     OXACILLIN >=4 RESISTANT Resistant     TETRACYCLINE <=1 SENSITIVE Sensitive     VANCOMYCIN 1 SENSITIVE Sensitive     TRIMETH/SULFA <=10 SENSITIVE Sensitive     CLINDAMYCIN >=8 RESISTANT Resistant     RIFAMPIN <=0.5 SENSITIVE Sensitive     Inducible Clindamycin NEGATIVE Sensitive     * LIGHT GROWTH STAPHYLOCOCCUS AUREUS    LIGHT GROWTH STAPHYLOCOCCUS AUREUS   Proteus mirabilis - MIC*    AMPICILLIN RESISTANT Resistant     AMPICILLIN/SULBACTAM RESISTANT Resistant     CEFAZOLIN RESISTANT Resistant     CIPROFLOXACIN SENSITIVE Sensitive     GENTAMICIN  SENSITIVE Sensitive     IMIPENEM SENSITIVE Sensitive     TRIMETH/SULFA SENSITIVE Sensitive    Proteus mirabilis - MIC*    AMPICILLIN >=32 RESISTANT Resistant     CEFAZOLIN >=64 RESISTANT Resistant     CEFEPIME <=1 RESISTANT Resistant     CEFTAZIDIME 16 RESISTANT Resistant     CEFTRIAXONE 8 RESISTANT Resistant     CIPROFLOXACIN <=0.25 SENSITIVE Sensitive     GENTAMICIN <=1 SENSITIVE Sensitive     IMIPENEM 1 SENSITIVE Sensitive     TRIMETH/SULFA <=20 SENSITIVE Sensitive     AMPICILLIN/SULBACTAM >=32 RESISTANT Resistant     PIP/TAZO <=4 SENSITIVE Sensitive     * LIGHT GROWTH PROTEUS MIRABILIS    LIGHT GROWTH PROTEUS MIRABILIS  Blood culture (routine x 2)     Status: None (Preliminary result)   Collection Time: 11/21/15 12:37 PM  Result Value Ref Range Status   Specimen Description BLOOD LEFT HAND  Final   Special Requests   Final    BOTTLES DRAWN AEROBIC AND ANAEROBIC  AERO 10CC ANA [place]   Culture NO GROWTH 3 DAYS  Final   Report Status PENDING  Incomplete  Blood culture (routine x 2)     Status: None (Preliminary result)   Collection Time: 11/21/15 12:37 PM  Result Value Ref Range Status   Specimen Description BLOOD RIGHT FATTY CASTS  Final   Special Requests BOTTLES DRAWN AEROBIC AND ANAEROBIC  1CC  Final   Culture NO GROWTH 3 DAYS  Final   Report Status PENDING  Incomplete    IMAGING: Dg Chest 1 View  11/08/2015  CLINICAL DATA:  Status post PICC line placement EXAM: CHEST 1 VIEW COMPARISON:  Not 07/14/2015 FINDINGS: Cardiac shadow is within normal limits. Elevation of the right hemidiaphragm is again seen. A new right PICC line is noted with the tip projecting at the cavoatrial junction. No other focal abnormality is seen. No bony abnormality is noted. IMPRESSION: PICC line at the SVC/ RA junction. Electronically Signed   By: Elta Guadeloupe  Lukens M.D.   On: 11/08/2015 21:53   Dg Foot Complete Left  11/04/2015  CLINICAL DATA:  Medial forefoot and medial heel ulcers. EXAM: LEFT FOOT -  COMPLETE 3+ VIEW COMPARISON:  07/22/2015 FINDINGS: Marked hallux valgus deformity identified. Status post osteotomy of the distal aspect of the fifth metatarsal. Soft tissue ulceration overlying in the first MTP joint is identified. No underlying bone erosion identified. Hammertoe deformities are present. Diffuse soft tissue swelling. IMPRESSION: 1. Soft tissue ulceration overlying the first MTP joint. No underlying bone erosion noted. 2. Chronic changes as above Electronically Signed   By: Kerby Moors M.D.   On: 11/04/2015 12:11    Assessment:   [name] is a 82 y.o. male with DM, PVD s/p revascularization readmitted 6/19 with worsening of chronic wound onL heel ulcer. He is s/p surgical amputatton first ray and abx placement 6/3, prior cx with MRSA, enterococcus and proteus. Recently dced on vanco and zosyn via PICC Has seen podiatry and has improvement in recent surgical site on first ray but woseneing heel and will need repeat surgery and wound vac.  On admit wbc 9, no fevers.  Recommendations Recheck esr, crp Continue vanco and zosyn  For debridment per podiatry  Thank you very much for allowing me to participate in the care of this patient. Please call with questions.   Cheral Marker. Ola Spurr, MD

## 2015-11-24 NOTE — H&P (Signed)
H and P has been reviewed and no changes are noted.  

## 2015-11-25 ENCOUNTER — Encounter
Admission: EM | Disposition: A | Discharge status: Skilled Nursing Facility | Payer: Self-pay | Source: Home / Self Care | Attending: Internal Medicine

## 2015-11-25 HISTORY — PX: PERIPHERAL VASCULAR CATHETERIZATION: SHX172C

## 2015-11-25 LAB — CBC
HEMATOCRIT: 30 % — AB (ref 40.0–52.0)
HEMOGLOBIN: 10.1 g/dL — AB (ref 13.0–18.0)
MCH: 30.2 pg (ref 26.0–34.0)
MCHC: 33.7 g/dL (ref 32.0–36.0)
MCV: 89.5 fL (ref 80.0–100.0)
Platelets: 248 10*3/uL (ref 150–440)
RBC: 3.36 MIL/uL — ABNORMAL LOW (ref 4.40–5.90)
RDW: 15.6 % — ABNORMAL HIGH (ref 11.5–14.5)
WBC: 7.2 10*3/uL (ref 3.8–10.6)

## 2015-11-25 LAB — BASIC METABOLIC PANEL
Anion gap: 4 — ABNORMAL LOW (ref 5–15)
BUN: 17 mg/dL (ref 6–20)
CHLORIDE: 108 mmol/L (ref 101–111)
CO2: 26 mmol/L (ref 22–32)
CREATININE: 1.03 mg/dL (ref 0.61–1.24)
Calcium: 8.2 mg/dL — ABNORMAL LOW (ref 8.9–10.3)
GFR calc non Af Amer: >60 mL/min (ref >60)
Glucose, Bld: 118 mg/dL — ABNORMAL HIGH (ref 65–99)
Potassium: 3.4 mmol/L — ABNORMAL LOW (ref 3.5–5.1)
Sodium: 138 mmol/L (ref 135–145)

## 2015-11-25 LAB — MAGNESIUM: Magnesium: 1.8 mg/dL (ref 1.7–2.4)

## 2015-11-25 LAB — GLUCOSE, CAPILLARY
GLUCOSE-CAPILLARY: 156 mg/dL — AB (ref 65–99)
GLUCOSE-CAPILLARY: 118 mg/dL — AB (ref 65–99)
GLUCOSE-CAPILLARY: 87 mg/dL (ref 65–99)
Glucose-Capillary: 119 mg/dL — ABNORMAL HIGH (ref 65–99)
Glucose-Capillary: 104 mg/dL — ABNORMAL HIGH (ref 65–99)

## 2015-11-25 SURGERY — LOWER EXTREMITY ANGIOGRAPHY
Anesthesia: Moderate Sedation | Laterality: Right

## 2015-11-25 MED ORDER — HYDROCERIN EX CREA
TOPICAL_CREAM | Freq: Two times a day (BID) | CUTANEOUS | Status: DC
  Administered 2015-11-25 – 2015-11-28 (×6): via TOPICAL
  Filled 2015-11-25: qty 113

## 2015-11-25 MED ORDER — DEXTROSE 5 % IV SOLN
INTRAVENOUS | Status: AC
  Filled 2015-11-25 (×35): qty 1.5

## 2015-11-25 MED ORDER — HEPARIN SODIUM (PORCINE) 1000 UNIT/ML IJ SOLN
INTRAMUSCULAR | Status: DC | PRN
  Administered 2015-11-25: 1000 [IU] via INTRAVENOUS
  Administered 2015-11-25: 4000 [IU] via INTRAVENOUS

## 2015-11-25 MED ORDER — MIDAZOLAM HCL 5 MG/5ML IJ SOLN
INTRAMUSCULAR | Status: AC
  Filled 2015-11-25: qty 5

## 2015-11-25 MED ORDER — FENTANYL CITRATE (PF) 100 MCG/2ML IJ SOLN
INTRAMUSCULAR | Status: DC | PRN
  Administered 2015-11-25: 50 ug via INTRAVENOUS
  Administered 2015-11-25: 25 ug via INTRAVENOUS

## 2015-11-25 MED ORDER — FENTANYL CITRATE (PF) 100 MCG/2ML IJ SOLN
INTRAMUSCULAR | Status: AC
  Filled 2015-11-25: qty 2

## 2015-11-25 MED ORDER — VANCOMYCIN HCL IN DEXTROSE 1-5 GM/200ML-% IV SOLN
1000.0000 mg | INTRAVENOUS | Status: DC
  Administered 2015-11-26 – 2015-11-27 (×2): 1000 mg via INTRAVENOUS
  Filled 2015-11-25 (×3): qty 200

## 2015-11-25 MED ORDER — LIDOCAINE HCL (PF) 1 % IJ SOLN
INTRAMUSCULAR | Status: AC
  Filled 2015-11-25: qty 30

## 2015-11-25 MED ORDER — HEPARIN SODIUM (PORCINE) 1000 UNIT/ML IJ SOLN
INTRAMUSCULAR | Status: AC
  Filled 2015-11-25: qty 1

## 2015-11-25 MED ORDER — MIDAZOLAM HCL 2 MG/2ML IJ SOLN
INTRAMUSCULAR | Status: DC | PRN
  Administered 2015-11-25 (×2): 1 mg via INTRAVENOUS
  Administered 2015-11-25: 2 mg via INTRAVENOUS

## 2015-11-25 MED ORDER — IOPAMIDOL (ISOVUE-300) INJECTION 61%
INTRAVENOUS | Status: DC | PRN
  Administered 2015-11-25: 70 mL via INTRA_ARTERIAL

## 2015-11-25 MED ORDER — HEPARIN (PORCINE) IN NACL 2-0.9 UNIT/ML-% IJ SOLN
INTRAMUSCULAR | Status: AC
  Filled 2015-11-25: qty 1000

## 2015-11-25 MED ORDER — DEXTROSE 5 % IV SOLN
1.5000 g | Freq: Once | INTRAVENOUS | Status: AC
  Administered 2015-11-25: 1.5 g via INTRAVENOUS

## 2015-11-25 MED ORDER — ONDANSETRON HCL 4 MG/2ML IJ SOLN
4.0000 mg | Freq: Once | INTRAMUSCULAR | Status: AC
  Administered 2015-11-25: 4 mg via INTRAVENOUS

## 2015-11-25 MED ORDER — ONDANSETRON HCL 4 MG/2ML IJ SOLN
INTRAMUSCULAR | Status: AC
  Filled 2015-11-25: qty 2

## 2015-11-25 SURGICAL SUPPLY — 27 items
BALLN ULTRVRSE 2X300X150 (BALLOONS) ×2
BALLN ULTRVRSE 2X300X150 OTW (BALLOONS) ×1
BALLN ULTRVRSE 3X300X150 (BALLOONS) ×2
BALLN ULTRVRSE 3X300X150 OTW (BALLOONS) ×1
BALLOON ULTRVRSE 2X300X150 OTW (BALLOONS) ×1 IMPLANT
BALLOON ULTRVRSE 3X300X150 OTW (BALLOONS) ×1 IMPLANT
CATH CXI SUPP ANG 4FR 135 (MICROCATHETER) ×1 IMPLANT
CATH CXI SUPP ANG 4FR 135CM (MICROCATHETER) ×3
CATH G STR 4FX120.038 (CATHETERS) ×3 IMPLANT
CATH PIG 70CM (CATHETERS) ×3 IMPLANT
CATH PRIORITY ONE AC 6F (CATHETERS) ×3 IMPLANT
CATH RIM 65CM (CATHETERS) ×3 IMPLANT
CATH VERT 100CM (CATHETERS) ×3 IMPLANT
DEVICE PRESTO INFLATION (MISCELLANEOUS) ×3 IMPLANT
DEVICE STARCLOSE SE CLOSURE (Vascular Products) ×3 IMPLANT
GLIDECATH ANGLED 4FR 120CM (CATHETERS) ×3 IMPLANT
PACK ANGIOGRAPHY (CUSTOM PROCEDURE TRAY) ×3 IMPLANT
SET INTRO CAPELLA COAXIAL (SET/KITS/TRAYS/PACK) ×3 IMPLANT
SHEATH BRITE TIP 5FRX11 (SHEATH) ×3 IMPLANT
SHEATH RAABE 6FR (SHEATH) ×3 IMPLANT
SHIELD RADPAD SCOOP 12X17 (MISCELLANEOUS) ×3 IMPLANT
SYR MEDRAD MARK V 150ML (SYRINGE) ×3 IMPLANT
TUBING CONTRAST HIGH PRESS 72 (TUBING) ×3 IMPLANT
WIRE G V18X300CM (WIRE) ×3 IMPLANT
WIRE J 3MM .035X145CM (WIRE) ×6 IMPLANT
WIRE MAGIC TORQUE 260C (WIRE) ×3 IMPLANT
WIRE SPARTACORE .014X300CM (WIRE) ×3 IMPLANT

## 2015-11-25 NOTE — Progress Notes (Signed)
Per MD patient will likely be ready for D/C Monday 11/28/15. [facility]. [facility] aware of above. CSW will continue to follow and assist as needed.   Jetta LoutBailey Morgan, LCSW [phone]

## 2015-11-25 NOTE — Consult Note (Addendum)
Pharmacy Antibiotic Note  [name] is a 82 y.o. male admitted on 11/21/2015 with foot infection.  Pharmacy has been consulted for vancomycin and zosyn dosing.  Plan: Based on previous trough levels, new Ke for vancomycin approximately 0.031 hr-1, T1/2 22.3 hr. Vancomycin 1 gm IV Q24H predicted to produce trough 17 mcg/mL. Pharmacy will continue to monitor and adjust as needed to maintain trough 15 to 20 mcg/mL.    Continue zosyn 3.375g q 8 hours EI infusion   Height: 6' (182.9 cm) Weight: 182 lb (82.555 kg) IBW/kg (Calculated) : 77.6  Temp (24hrs), Avg:97.6 F (36.4 C), Min:96.8 F (36 C), Max:98.6 F (37 C)   Recent Labs Lab 11/21/15 1236 11/21/15 1813 11/22/15 0340  11/23/15 0541 11/24/15 0645 11/25/15 0500  WBC 9.7  --  9.1  --  9.3  --  7.2  CREATININE 1.12  --  1.18  --  1.18  --  1.03  VANCOTROUGH  --   --   --   < > 37* 17  --   VANCORANDOM  --  30  --   --   --   --   --   < > = values in this interval not displayed.  Estimated Creatinine Clearance: 54.4 mL/min (by C-G formula based on Cr of 1.03).    No Known Allergies  Antimicrobials this admission: vancomycin 6/2 >> previous admission>> zosyn 6/2 >> pervious admission>>  Dose adjustments this admission:   Microbiology results: Recent Results (from the past 720 hour(s))  WOUND CULTURE ([facility] ONLY)     Status: None   Collection Time: 11/01/15  1:20 PM  Result Value Ref Range Status   Specimen Description HEEL  Final   Special Requests LEFT  Final   Gram Stain RARE WBC SEEN NO ORGANISMS SEEN   Final   Culture   Final    LIGHT GROWTH PROTEUS MIRABILIS LIGHT GROWTH METHICILLIN RESISTANT STAPHYLOCOCCUS AUREUS MODERATE GROWTH ENTEROCOCCUS FAECALIS    Report Status 11/09/2015 FINAL  Final   Organism ID, Bacteria PROTEUS MIRABILIS  Final   Organism ID, Bacteria METHICILLIN RESISTANT STAPHYLOCOCCUS AUREUS  Final   Organism ID, Bacteria ENTEROCOCCUS FAECALIS  Final      Susceptibility   Proteus  mirabilis - MIC*    AMPICILLIN <=2 SENSITIVE Sensitive     CEFAZOLIN <=4 SENSITIVE Sensitive     CEFEPIME <=1 SENSITIVE Sensitive     CEFTAZIDIME <=1 SENSITIVE Sensitive     CEFTRIAXONE <=1 SENSITIVE Sensitive     CIPROFLOXACIN <=0.25 SENSITIVE Sensitive     GENTAMICIN <=1 SENSITIVE Sensitive     IMIPENEM 2 SENSITIVE Sensitive     TRIMETH/SULFA <=20 SENSITIVE Sensitive     AMPICILLIN/SULBACTAM <=2 SENSITIVE Sensitive     PIP/TAZO <=4 SENSITIVE Sensitive     * LIGHT GROWTH PROTEUS MIRABILIS   Enterococcus faecalis - MIC*    AMPICILLIN SENSITIVE Sensitive     LINEZOLID SENSITIVE Sensitive     * MODERATE GROWTH ENTEROCOCCUS FAECALIS   Methicillin resistant staphylococcus aureus - MIC*    CIPROFLOXACIN >=8 RESISTANT Resistant     ERYTHROMYCIN >=8 RESISTANT Resistant     GENTAMICIN <=0.5 SENSITIVE Sensitive     OXACILLIN >=4 RESISTANT Resistant     TETRACYCLINE 2 SENSITIVE Sensitive     VANCOMYCIN 1 SENSITIVE Sensitive     TRIMETH/SULFA <=10 SENSITIVE Sensitive     CLINDAMYCIN >=8 RESISTANT Resistant     RIFAMPIN <=0.5 SENSITIVE Sensitive     Inducible Clindamycin NEGATIVE Sensitive     *  LIGHT GROWTH METHICILLIN RESISTANT STAPHYLOCOCCUS AUREUS  Surgical pcr screen     Status: Abnormal   Collection Time: 11/04/15  6:11 PM  Result Value Ref Range Status   MRSA, PCR POSITIVE (A) NEGATIVE Final    Comment: CRITICAL RESULT CALLED TO, READ BACK BY AND VERIFIED WITH: MARY RAYMOND AT 2324 11/04/15.PMH    Staphylococcus aureus POSITIVE (A) NEGATIVE Final    Comment:        The Xpert SA Assay (FDA approved for NASAL specimens in patients over 80 years of age), is one component of a comprehensive surveillance program.  Test performance has been validated by [facility] for patients greater than or equal to 80 year old. It is not intended to diagnose infection nor to guide or monitor treatment.   Anaerobic culture     Status: None   Collection Time: 11/05/15 12:17 PM  Result Value  Ref Range Status   Specimen Description WOUND  Final   Special Requests NONE  Final   Culture NO ANAEROBES ISOLATED  Final   Report Status 11/11/2015 FINAL  Final  WOUND CULTURE ([facility] ONLY)     Status: None   Collection Time: 11/05/15 12:17 PM  Result Value Ref Range Status   Specimen Description WOUND  Final   Special Requests NONE  Final   Gram Stain   Final    FEW RED BLOOD CELLS RARE WBC SEEN NO ORGANISMS SEEN    Culture   Final    LIGHT GROWTH PROTEUS MIRABILIS LIGHT GROWTH STAPHYLOCOCCUS AUREUS    Report Status 11/08/2015 FINAL  Final   Organism ID, Bacteria PROTEUS MIRABILIS  Final   Organism ID, Bacteria PROTEUS MIRABILIS  Final   Organism ID, Bacteria STAPHYLOCOCCUS AUREUS  Final   Organism ID, Bacteria STAPHYLOCOCCUS AUREUS  Final      Susceptibility   Staphylococcus aureus - MIC*    CEFAZOLIN RESISTANT Resistant     LEVOFLOXACIN RESISTANT Resistant     OXACILLIN RESISTANT Resistant     TRIMETH/SULFA SENSITIVE Sensitive     VANCOMYCIN SENSITIVE Sensitive     TETRACYCLINE SENSITIVE Sensitive    Staphylococcus aureus - MIC*    CIPROFLOXACIN >=8 RESISTANT Resistant     ERYTHROMYCIN >=8 RESISTANT Resistant     GENTAMICIN <=0.5 SENSITIVE Sensitive     OXACILLIN >=4 RESISTANT Resistant     TETRACYCLINE <=1 SENSITIVE Sensitive     VANCOMYCIN 1 SENSITIVE Sensitive     TRIMETH/SULFA <=10 SENSITIVE Sensitive     CLINDAMYCIN >=8 RESISTANT Resistant     RIFAMPIN <=0.5 SENSITIVE Sensitive     Inducible Clindamycin NEGATIVE Sensitive     * LIGHT GROWTH STAPHYLOCOCCUS AUREUS    LIGHT GROWTH STAPHYLOCOCCUS AUREUS   Proteus mirabilis - MIC*    AMPICILLIN RESISTANT Resistant     AMPICILLIN/SULBACTAM RESISTANT Resistant     CEFAZOLIN RESISTANT Resistant     CIPROFLOXACIN SENSITIVE Sensitive     GENTAMICIN SENSITIVE Sensitive     IMIPENEM SENSITIVE Sensitive     TRIMETH/SULFA SENSITIVE Sensitive    Proteus mirabilis - MIC*    AMPICILLIN >=32 RESISTANT Resistant      CEFAZOLIN >=64 RESISTANT Resistant     CEFEPIME <=1 RESISTANT Resistant     CEFTAZIDIME 16 RESISTANT Resistant     CEFTRIAXONE 8 RESISTANT Resistant     CIPROFLOXACIN <=0.25 SENSITIVE Sensitive     GENTAMICIN <=1 SENSITIVE Sensitive     IMIPENEM 1 SENSITIVE Sensitive     TRIMETH/SULFA <=20 SENSITIVE Sensitive  AMPICILLIN/SULBACTAM >=32 RESISTANT Resistant     PIP/TAZO <=4 SENSITIVE Sensitive     * LIGHT GROWTH PROTEUS MIRABILIS    LIGHT GROWTH PROTEUS MIRABILIS  Blood culture (routine x 2)     Status: None (Preliminary result)   Collection Time: 11/21/15 12:37 PM  Result Value Ref Range Status   Specimen Description BLOOD LEFT HAND  Final   Special Requests   Final    BOTTLES DRAWN AEROBIC AND ANAEROBIC  AERO 10CC ANA 7CC   Culture NO GROWTH 4 DAYS  Final   Report Status PENDING  Incomplete  Blood culture (routine x 2)     Status: None (Preliminary result)   Collection Time: 11/21/15 12:37 PM  Result Value Ref Range Status   Specimen Description BLOOD RIGHT FATTY CASTS  Final   Special Requests BOTTLES DRAWN AEROBIC AND ANAEROBIC  1CC  Final   Culture NO GROWTH 4 DAYS  Final   Report Status PENDING  Incomplete     Thank you for allowing pharmacy to be a part of this patient's care.  Carola FrostNathan A Burris Matherne, Pharm.D., BCPS Clinical Pharmacist 11/25/2015 10:07 PM

## 2015-11-25 NOTE — Progress Notes (Signed)
ID E note S.p revascularization and debridement [name] is a 82 y.o. male with DM, PVD s/p revascularization readmitted 6/19 with worsening of chronic wound onL heel ulcer. He is s/p surgical amputatton first ray and abx placement 6/3, prior cx with MRSA, enterococcus and proteus. Recently dced on vanco and zosyn via PICC Has seen podiatry and has improvement in recent surgical site on first ray but woseneing heel and will need repeat surgery and wound vac.  On admit wbc 9, no fevers.  Recommendations Continue vanco and zosyn for a 2 week period following surgurgical debridement I have updated the abx order sheet - I can see with podiatry in follow up but will be out of town for next week.

## 2015-11-25 NOTE — Op Note (Signed)
[facility] Percutaneous Study/Intervention Procedural Note   Date of Surgery: 11/25/2015  Surgeon:  Katha Cabal, MD.  Pre-operative Diagnosis: Atherosclerotic occlusive disease bilateral lower extremities with dry gangrene bilateral heels  Post-operative diagnosis: Same  Procedure(s) Performed: 1. Introduction catheter into right lower extremity 3rd order catheter placement  2. Contrast injection right lower extremity for distal runoff   3. Percutaneous transluminal angioplasty right posterior tibial to 3 mm 4. Aspiration thrombectomy right popliteal and TP trunk arteries with the [facility] catheter             5.  Aspiration thrombectomy right posterior tibial artery with the [facility] catheter             6.  Closure left common femoral artery with the Star close device  Anesthesia: Conscious sedation was administered under my direct supervision. IV Versed plus fentanyl were utilized. Continuous ECG, pulse oximetry and blood pressure was monitored throughout the entire procedure.  Versed and fentanyl were utilized.  Conscious sedation was  for a total of 2 hours 10 minutes.  Sheath: 6 French Rabi left common femoral artery  Contrast: 70 cc  Fluoroscopy Time: 13 minutes  Indications: [name] presents with gangrenous changes to both heels. He has known atherosclerotic occlusive disease. The risks and benefits are reviewed all questions answered patient agrees to proceed.  Procedure: [name] is a 82 y.o. y.o. male who was identified and appropriate procedural time out was performed. The patient was then placed supine on the table and prepped and draped in the usual sterile fashion.   Ultrasound was placed in the sterile sleeve and the left groin was evaluated the left common femoral artery was echolucent and pulsatile indicating patency.  Image was recorded for the  permanent record and under real-time visualization a microneedle was inserted into the common femoral artery microwire followed by a micro-sheath.  A J-wire was then advanced through the micro-sheath and a  5 [place] sheath was then inserted over a J-wire. J-wire was then advanced and a 5 French rim catheter with the stiff angle Glidewire was used to cross the aortic bifurcation the catheter wire were advanced down into the right distal external iliac artery. Oblique view of the femoral bifurcation was then obtained and subsequently the wire was reintroduced and the pigtail catheter negotiated into the SFA representing third order catheter placement. Distal runoff was then performed.  5000 units of heparin total was then given and allowed to circulate and a 6 [place] rabi sheath was advanced up and over the bifurcation and positioned in the femoral artery  Straight glide  catheter and stiff angle Glidewire were then negotiated down into the distal popliteal.  Distal runoff was then completed by hand injection through the catheter. After review these images using an angled CXI and AV 18 wire the posterior tibial was selected and the wire and catheter negotiated down into the distal artery. Hand injection contrast verified intraluminal placement as well as the pedal arterial supply. V 18 wire was then returned and initially a 2 x 30 ultra versed balloon and subsequently a 3 x 30 ultra versed balloon were used to treat the posterior tibial. Both inflations were to 14 atm for 2 minutes in duration. Follow-up imaging beginning in the mid popliteal demonstrated thrombus in the distal popliteal proximal tibioperoneal trunk. Further investigation also noted thrombus in the distal posterior tibial.  A [facility] AC catheter was then prepped on the back  table catheter exchange was used to exchange the V 18 wire to a [place] core wire and the aspiration catheter advanced down to the popliteal where multiple passes were  made using the aspiration syringes. A total of 3 syringes was utilized. Follow-up imaging demonstrated complete resolution of the thrombus previously noted in the distal popliteal. CXI catheter was then reintroduced over the 014 wire and the wire catheter negotiated into the posterior tibial the [facility] AC catheter was negotiated down to the level of the occlusion in the posterior tibial and multiple passes were made again using the aspiration catheter. A total of 2 syringes were aspirated. Follow-up imaging now demonstrated complete resolution of this occlusion as well.  Distal runoff was then reassessed from the level of the knee to the foot. The patient now is widely patent patent popliteal tibioperoneal trunk peroneal and posterior tibial with filling of the lateral plantar and the digital vessels.  After review of these images the sheath is pulled into the left external iliac oblique of the common femoral is obtained and a Star close device deployed. There no immediate complications.   Findings: The abdominal aorta was imaged earlier this week when the left leg was done and does not need to be reimaged. Right leg was not assessed at that time.  The right common femoral is widely patent as is the profunda femoris.  The SFA is patent as is the popliteal and the initial images. Anterior tibial occludes within a few millimeters of its origin. Tibioperoneal trunk is patent and fills a large peroneal artery which is patent down to the foot and collateralizes to the dorsalis pedis which is incomplete and the lateral plantar. There is a threadlike posterior tibial with several areas that appear to be occluded along its course at the level of the ankle the posterior tibial resumes a more normal caliber of approximately 3 mm as noted above the lateral plan medial plantar are filling as well.   Following angioplasty posterior tibial  tibial now is in-line flow and looks quite nice. However, thrombus is  noted in the distal popliteal and this is aspirated as described above successfully. Follow-up imaging also finds thrombus in the distal most posterior tibial and again this is aspirated with great success.   Summary: Successful recanalization right lower extremity lower cavity for limb salvage now with in-line flow to the foot via the posterior tibial and peroneal.    Disposition: Patient was taken to the recovery room in stable condition having tolerated the procedure well.  [name], [name] 11/25/2015,5:14 PM

## 2015-11-25 NOTE — Care Management (Signed)
Plan for SNF at discharge. RNCM has not pursued VAC auth as SNF will be responsible for that.

## 2015-11-25 NOTE — Progress Notes (Signed)
Report to LowellJadie.  Check right groin for bleeding or hematoma.  Patient will be on bedrest for 4 hours post sheath pull---out of bed at 21:00.    Bilateral pulses are dopplers bilateral between great and first toe....the left doppler is more faint than the right

## 2015-11-25 NOTE — Consult Note (Signed)
Pharmacy Antibiotic Note  [name] is a 82 y.o. male admitted on 11/21/2015 with foot infection.  Pharmacy has been consulted for vancomycin and zosyn dosing.  Plan: Continue Vancomycin 1000 IV every 36 hours.  Goal trough 15-20 mcg/mL.  Continue zosyn 3.375g q 8 hours EI infusion Trough on 6/26 @ 1900  Height: 6' (182.9 cm) Weight: 182 lb (82.555 kg) IBW/kg (Calculated) : 77.6  Temp (24hrs), Avg:97.6 F (36.4 C), Min:96.7 F (35.9 C), Max:98.4 F (36.9 C)   Recent Labs Lab 11/21/15 1236 11/21/15 1813 11/22/15 0340  11/23/15 0541 11/24/15 0645 11/25/15 0500  WBC 9.7  --  9.1  --  9.3  --  7.2  CREATININE 1.12  --  1.18  --  1.18  --  1.03  VANCOTROUGH  --   --   --   < > 37* 17  --   VANCORANDOM  --  30  --   --   --   --   --   < > = values in this interval not displayed.  Estimated Creatinine Clearance: 54.4 mL/min (by C-G formula based on Cr of 1.03).    No Known Allergies  Antimicrobials this admission: vancomycin 6/2 >> previous admission>> zosyn 6/2 >> pervious admission>>  Dose adjustments this admission:   Microbiology results: Recent Results (from the past 720 hour(s))  WOUND CULTURE ([facility] ONLY)     Status: None   Collection Time: 11/01/15  1:20 PM  Result Value Ref Range Status   Specimen Description HEEL  Final   Special Requests LEFT  Final   Gram Stain RARE WBC SEEN NO ORGANISMS SEEN   Final   Culture   Final    LIGHT GROWTH PROTEUS MIRABILIS LIGHT GROWTH METHICILLIN RESISTANT STAPHYLOCOCCUS AUREUS MODERATE GROWTH ENTEROCOCCUS FAECALIS    Report Status 11/09/2015 FINAL  Final   Organism ID, Bacteria PROTEUS MIRABILIS  Final   Organism ID, Bacteria METHICILLIN RESISTANT STAPHYLOCOCCUS AUREUS  Final   Organism ID, Bacteria ENTEROCOCCUS FAECALIS  Final      Susceptibility   Proteus mirabilis - MIC*    AMPICILLIN <=2 SENSITIVE Sensitive     CEFAZOLIN <=4 SENSITIVE Sensitive     CEFEPIME <=1 SENSITIVE Sensitive     CEFTAZIDIME <=1  SENSITIVE Sensitive     CEFTRIAXONE <=1 SENSITIVE Sensitive     CIPROFLOXACIN <=0.25 SENSITIVE Sensitive     GENTAMICIN <=1 SENSITIVE Sensitive     IMIPENEM 2 SENSITIVE Sensitive     TRIMETH/SULFA <=20 SENSITIVE Sensitive     AMPICILLIN/SULBACTAM <=2 SENSITIVE Sensitive     PIP/TAZO <=4 SENSITIVE Sensitive     * LIGHT GROWTH PROTEUS MIRABILIS   Enterococcus faecalis - MIC*    AMPICILLIN SENSITIVE Sensitive     LINEZOLID SENSITIVE Sensitive     * MODERATE GROWTH ENTEROCOCCUS FAECALIS   Methicillin resistant staphylococcus aureus - MIC*    CIPROFLOXACIN >=8 RESISTANT Resistant     ERYTHROMYCIN >=8 RESISTANT Resistant     GENTAMICIN <=0.5 SENSITIVE Sensitive     OXACILLIN >=4 RESISTANT Resistant     TETRACYCLINE 2 SENSITIVE Sensitive     VANCOMYCIN 1 SENSITIVE Sensitive     TRIMETH/SULFA <=10 SENSITIVE Sensitive     CLINDAMYCIN >=8 RESISTANT Resistant     RIFAMPIN <=0.5 SENSITIVE Sensitive     Inducible Clindamycin NEGATIVE Sensitive     * LIGHT GROWTH METHICILLIN RESISTANT STAPHYLOCOCCUS AUREUS  Surgical pcr screen     Status: Abnormal   Collection Time: 11/04/15  6:11 PM  Result Value Ref Range Status   MRSA, PCR POSITIVE (A) NEGATIVE Final    Comment: CRITICAL RESULT CALLED TO, READ BACK BY AND VERIFIED WITH: MARY RAYMOND AT 2324 11/04/15.PMH    Staphylococcus aureus POSITIVE (A) NEGATIVE Final    Comment:        The Xpert SA Assay (FDA approved for NASAL specimens in patients over 80 years of age), is one component of a comprehensive surveillance program.  Test performance has been validated by [facility] for patients greater than or equal to 80 year old. It is not intended to diagnose infection nor to guide or monitor treatment.   Anaerobic culture     Status: None   Collection Time: 11/05/15 12:17 PM  Result Value Ref Range Status   Specimen Description WOUND  Final   Special Requests NONE  Final   Culture NO ANAEROBES ISOLATED  Final   Report Status 11/11/2015  FINAL  Final  WOUND CULTURE ([facility] ONLY)     Status: None   Collection Time: 11/05/15 12:17 PM  Result Value Ref Range Status   Specimen Description WOUND  Final   Special Requests NONE  Final   Gram Stain   Final    FEW RED BLOOD CELLS RARE WBC SEEN NO ORGANISMS SEEN    Culture   Final    LIGHT GROWTH PROTEUS MIRABILIS LIGHT GROWTH STAPHYLOCOCCUS AUREUS    Report Status 11/08/2015 FINAL  Final   Organism ID, Bacteria PROTEUS MIRABILIS  Final   Organism ID, Bacteria PROTEUS MIRABILIS  Final   Organism ID, Bacteria STAPHYLOCOCCUS AUREUS  Final   Organism ID, Bacteria STAPHYLOCOCCUS AUREUS  Final      Susceptibility   Staphylococcus aureus - MIC*    CEFAZOLIN RESISTANT Resistant     LEVOFLOXACIN RESISTANT Resistant     OXACILLIN RESISTANT Resistant     TRIMETH/SULFA SENSITIVE Sensitive     VANCOMYCIN SENSITIVE Sensitive     TETRACYCLINE SENSITIVE Sensitive    Staphylococcus aureus - MIC*    CIPROFLOXACIN >=8 RESISTANT Resistant     ERYTHROMYCIN >=8 RESISTANT Resistant     GENTAMICIN <=0.5 SENSITIVE Sensitive     OXACILLIN >=4 RESISTANT Resistant     TETRACYCLINE <=1 SENSITIVE Sensitive     VANCOMYCIN 1 SENSITIVE Sensitive     TRIMETH/SULFA <=10 SENSITIVE Sensitive     CLINDAMYCIN >=8 RESISTANT Resistant     RIFAMPIN <=0.5 SENSITIVE Sensitive     Inducible Clindamycin NEGATIVE Sensitive     * LIGHT GROWTH STAPHYLOCOCCUS AUREUS    LIGHT GROWTH STAPHYLOCOCCUS AUREUS   Proteus mirabilis - MIC*    AMPICILLIN RESISTANT Resistant     AMPICILLIN/SULBACTAM RESISTANT Resistant     CEFAZOLIN RESISTANT Resistant     CIPROFLOXACIN SENSITIVE Sensitive     GENTAMICIN SENSITIVE Sensitive     IMIPENEM SENSITIVE Sensitive     TRIMETH/SULFA SENSITIVE Sensitive    Proteus mirabilis - MIC*    AMPICILLIN >=32 RESISTANT Resistant     CEFAZOLIN >=64 RESISTANT Resistant     CEFEPIME <=1 RESISTANT Resistant     CEFTAZIDIME 16 RESISTANT Resistant     CEFTRIAXONE 8 RESISTANT Resistant      CIPROFLOXACIN <=0.25 SENSITIVE Sensitive     GENTAMICIN <=1 SENSITIVE Sensitive     IMIPENEM 1 SENSITIVE Sensitive     TRIMETH/SULFA <=20 SENSITIVE Sensitive     AMPICILLIN/SULBACTAM >=32 RESISTANT Resistant     PIP/TAZO <=4 SENSITIVE Sensitive     * LIGHT GROWTH PROTEUS MIRABILIS    LIGHT  GROWTH PROTEUS MIRABILIS  Blood culture (routine x 2)     Status: None (Preliminary result)   Collection Time: 11/21/15 12:37 PM  Result Value Ref Range Status   Specimen Description BLOOD LEFT HAND  Final   Special Requests   Final    BOTTLES DRAWN AEROBIC AND ANAEROBIC  AERO 10CC ANA 7CC   Culture NO GROWTH 4 DAYS  Final   Report Status PENDING  Incomplete  Blood culture (routine x 2)     Status: None (Preliminary result)   Collection Time: 11/21/15 12:37 PM  Result Value Ref Range Status   Specimen Description BLOOD RIGHT FATTY CASTS  Final   Special Requests BOTTLES DRAWN AEROBIC AND ANAEROBIC  1CC  Final   Culture NO GROWTH 4 DAYS  Final   Report Status PENDING  Incomplete     Thank you for allowing pharmacy to be a part of this patient's care.  Olene Floss 11/25/2015 3:03 PM

## 2015-11-25 NOTE — Progress Notes (Signed)
[facility]   PATIENT NAME: [name]    MR#:  [id number]  DATE OF BIRTH:  [birth year]  SUBJECTIVE:  CHIEF COMPLAINT:   Chief Complaint  Patient presents with  . Foot Injury   - s/p debridement of left heel eschar 11/24/15, for right leg angiogram today - has a wound vac on left foot - no other complaints  REVIEW OF SYSTEMS:  Review of Systems  Constitutional: Positive for malaise/fatigue. Negative for fever and chills.  HENT: Positive for hearing loss. Negative for ear discharge, ear pain and nosebleeds.   Eyes: Negative for blurred vision and double vision.  Respiratory: Negative for cough, shortness of breath and wheezing.   Cardiovascular: Negative for chest pain, palpitations and leg swelling.  Gastrointestinal: Negative for nausea, vomiting, abdominal pain, diarrhea and constipation.  Genitourinary: Negative for dysuria and hematuria.  Musculoskeletal: Positive for myalgias.  Neurological: Negative for dizziness, speech change, focal weakness, seizures and headaches.  Psychiatric/Behavioral: Negative for depression.    DRUG ALLERGIES:  No Known Allergies  VITALS:  Blood pressure 118/58, pulse 72, temperature 97 F (36.1 C), temperature source Oral, resp. rate 18, height 6' (1.829 m), weight 82.555 kg (182 lb), SpO2 90 %.  PHYSICAL EXAMINATION:  Physical Exam  GENERAL:  82 y.o.-year-old elderly patient lying in the bed with no acute distress.  EYES: Pupils equal, round, reactive to light and accommodation. No scleral icterus. Extraocular muscles intact.  HEENT: Head atraumatic, normocephalic. Oropharynx and nasopharynx clear.  NECK:  Supple, no jugular venous distention. No thyroid enlargement, no tenderness.  LUNGS: Normal breath sounds bilaterally, no wheezing, rales,rhonchi or crepitation. No use of accessory muscles of respiration. Decreased bibasilar breath sounds CARDIOVASCULAR: S1, S2 normal. No rubs, or  gallops. 3/6 systolic murmur present ABDOMEN: Soft, nontender, nondistended. Bowel sounds present. No organomegaly or mass.  EXTREMITIES: Left foot in dressing, both in heel floats, wound vac in place, likely fungal nail infection of hand nails NEUROLOGIC: Cranial nerves II through XII are intact. Muscle strength 5/5 in all extremities. Sensation intact. Gait not checked.  PSYCHIATRIC: The patient is alert and oriented x 3.  SKIN: No obvious rash, lesion, or ulcer.    LABORATORY PANEL:   CBC  Recent Labs Lab 11/25/15 0500  WBC 7.2  HGB 10.1*  HCT 30.0*  PLT 248   ------------------------------------------------------------------------------------------------------------------  Chemistries   Recent Labs Lab 11/25/15 0500  NA 138  K 3.4*  CL 108  CO2 26  GLUCOSE 118*  BUN 17  CREATININE 1.03  CALCIUM 8.2*  MG 1.8   ------------------------------------------------------------------------------------------------------------------  Cardiac Enzymes No results for input(s): TROPONINI in the last 168 hours. ------------------------------------------------------------------------------------------------------------------  RADIOLOGY:  No results found.  EKG:   Orders placed or performed in visit on 10/28/15  . EKG 12-Lead    ASSESSMENT AND PLAN:   82 year old male with past medical history of peripheral vascular disease, coronary artery disease, diabetes type 2 without, patient, history of aortic stenosis, hypertension, hyperlipidemia, history of previous TIA, hypothyroidism admitted from [facility] with left heel ulcer  1. Left heel ulcer with recent left first ray amputation- has a PICC line and was on IV ABX at the rehab -Appreciate podiatry and vascular consults. -s/p angiogram of left leg with angioplasty done. And debridement of left heel ulcer by Podiatry done and has wound vac on. - Continue empiric antibiotics with vancomycin and Zosyn- had MRSA and  proteus in the wound 2 weeks ago - ID consulted for  IV ABX- for now cont with  vanc and zosyn.  - Right leg angiogram today, no further debridements planned  2. History of Parkinson's disease-continue Sinemet.  3. Atrial fibrillation-on amiodarone. Continue low-dose aspirin. Rate controlled  4. Hypothyroidism-continue Synthroid.  5. Chronic venous insufficiency-patient is on Lasix at home. Decreased the dose. Leg wrappings, keep the legs elevated if needed. Last echo from February 2016 with normal ejection fraction.  6. Restless leg syndrome-continue Mirapex.   7. BPH-continue Flomax.  8. Hypothyroidism-continue Synthroid.   Physical therapy consulted- needs rehab Wants to go [place]- likely Monday   All the records are reviewed and case discussed with Care Management/Social Workerr. Management plans discussed with the patient, family and they are in agreement.  CODE STATUS: DNR  TOTAL TIME TAKING CARE OF THIS PATIENT: 33 minutes.   POSSIBLE D/C IN 2 DAYS, DEPENDING ON CLINICAL CONDITION.   Enid BaasKALISETTI,[name] M.D on 11/25/2015 at 10:39 AM  Between 7am to 6pm - Pager - [phone]  After 6pm go to [email or web address] - password [email or web address] [facility]  [facility]  Office  [phone]  CC: Primary care physician; Tonette LedererKIMBERLY A STEGMAYER, PA-C

## 2015-11-25 NOTE — Progress Notes (Signed)
Infectious Disease Long Term IV Antibiotic Orders  Diagnosis: L foot infection  Culture results prior cx with MRSA, enterococcus and proteus.    Allergies: No Known Allergies  Discharge antibiotics Vancomycin                1000 mg  every     36         hours .     Goal vancomycin trough 15-20.    Pharmacy to adjust dosing based on levels Zosyn 3.375 q 8 hours   PICC Care per protocol Labs weekly while on IV antibiotics      CBC w diff   Comprehensive met panel Vancomycin Trough   CRP   Planned duration of antibiotics 14 days from sugery  Stop date July 7th  Follow up clinic date TBD  FAX weekly labs to [phone]  Leonel [name], MD

## 2015-11-25 NOTE — Progress Notes (Signed)
Patient Demographics  [name], is a 82 y.o. male   MRN: [id number]   DOB - 1924/10/02  Admit Date - 11/21/2015    Outpatient Primary MD for the patient is Tonette Lederer, PA-C  Consult requested in the Hospital by Enid Baas, MD, On 11/25/2015      With History of -  Past Medical History  Diagnosis Date  . Coronary artery disease   . Diabetes mellitus without complication (HCC)   . Aortic stenosis   . Hypertension   . Hyperlipidemia   . TIA (transient ischemic attack)   . Thyroid disease   . Hypothyroidism       Past Surgical History  Procedure Laterality Date  . Appendectomy    . Hernia repair    . Knee surgery      bilateral  . Eye surgery    . Colonoscopy    . Cardiac catheterization  2010    showed a patient circumflex stent with noncritical CAD and normal right-sided pressures.  . Coronary angioplasty  01/24/2007    stent placement to the mid circumflex   . Knee surgery Left   . Peripheral vascular catheterization N/A 08/30/2015    Procedure: Abdominal Aortogram w/Lower Extremity;  Surgeon: Renford Dills, MD;  Location: [facility] INVASIVE CV [facility];  Service: Cardiovascular;  Laterality: N/A;  . Peripheral vascular catheterization  08/30/2015    Procedure: Lower Extremity Intervention;  Surgeon: Renford Dills, MD;  Location: [facility];  Service: Cardiovascular;;  . Amputation toe Left 11/05/2015    Procedure: AMPUTATION TOE;  Surgeon: Recardo Evangelist, DPM;  Location: [facility] ORS;  Service: Podiatry;  Laterality: Left;  . Irrigation and debridement foot Left 11/05/2015    Procedure: IRRIGATION AND DEBRIDEMENT FOOT / HEEL ULCER;  Surgeon: Recardo Evangelist, DPM;  Location: [facility] OR[facility];  Service: Podiatry;  Laterality: Left;  . Application of wound vac Left 11/05/2015    Procedure:  APPLICATION OF WOUND VAC;  Surgeon: Recardo Evangelist, DPM;  Location: [facility] OR[facility];  Service: Podiatry;  Laterality: Left;  . Peripheral vascular catheterization Left 11/23/2015    Procedure: Abdominal Aortogram w/Lower Extremity;  Surgeon: Renford Dills, MD;  Location: [facility];  Service: Cardiovascular;  Laterality: Left;  . Peripheral vascular catheterization  11/23/2015    Procedure: Lower Extremity Intervention;  Surgeon: Renford Dills, MD;  Location: [facility];  Service: Cardiovascular;;  . Irrigation and debridement foot Left 11/24/2015    Procedure: IRRIGATION AND DEBRIDEMENT FOOT;  Surgeon: Recardo Evangelist, DPM;  Location: [facility] OR[facility];  Service: Podiatry;  Laterality: Left;    in for   Chief Complaint  Patient presents with  . Foot Injury     HPI  [name]  is a 82 y.o. male, One day status post debridement of posterior left heel ulcer with application of wound VAC. 3 weeks status post first ray amputation left foot   Social History Social History  Substance Use Topics  . Smoking status: Never Smoker   . Smokeless tobacco: Never Used  . Alcohol Use: No     Family History Family History  Problem Relation Age of Onset  . Heart disease Mother   .  Heart attack Father [id number]    MI  . Heart attack Brother [id number]    MI   Prior to Admission medications   Medication Sig Start Date End Date Taking? Authorizing Provider  amiodarone (PACERONE) 200 MG tablet Take 100 mg by mouth daily.   Yes Historical Provider, MD  aspirin EC 81 MG tablet Take 1 tablet (81 mg total) by mouth daily. 08/30/15  Yes Renford DillsGregory G Schnier, MD  carbidopa-levodopa (SINEMET IR) 25-100 MG per tablet Take 1 tablet by mouth 4 (four) times daily.   Yes Historical Provider, MD  clopidogrel (PLAVIX) 75 MG tablet Take 75 mg by mouth daily.   Yes Historical Provider, MD  colchicine 0.6 MG tablet Take 0.6 mg by mouth daily. For 10 days. 11/18/15 11/28/15 Yes Historical Provider, MD  cyanocobalamin  (,VITAMIN B-12,) 1000 MCG/ML injection Inject 1 mL (1,000 mcg total) into the muscle every 30 (thirty) days. 1 injection in 2 weeks then every 30 days 09/19/15  Yes Steele SizerMark A Crissman, MD  feeding supplement, GLUCERNA SHAKE, (GLUCERNA SHAKE) LIQD Take 237 mLs by mouth 3 (three) times daily between meals. 11/09/15  Yes Auburn BilberryShreyang Patel, MD  furosemide (LASIX) 40 MG tablet Take 1 tablet (40 mg total) by mouth 2 (two) times daily. 07/29/15  Yes Antonieta Ibaimothy J Gollan, MD  levothyroxine (SYNTHROID, LEVOTHROID) 150 MCG tablet Take 150 mcg by mouth daily before breakfast.   Yes Historical Provider, MD  oxyCODONE-acetaminophen (PERCOCET/ROXICET) 5-325 MG tablet Take 1 tablet by mouth every 4 (four) hours as needed for moderate pain. 11/09/15  Yes Auburn BilberryShreyang Patel, MD  potassium chloride (K-DUR,KLOR-CON) 10 MEQ tablet Take 10 mEq by mouth daily.  10/07/15  Yes Historical Provider, MD  pramipexole (MIRAPEX) 1 MG tablet Take 0.5 mg by mouth 4 (four) times daily.    Yes Historical Provider, MD  sitaGLIPtin (JANUVIA) 100 MG tablet Take 1 tablet (100 mg total) by mouth daily. 07/11/15  Yes Steele SizerMark A Crissman, MD  tamsulosin (FLOMAX) 0.4 MG CAPS capsule Take 1 capsule (0.4 mg total) by mouth daily after supper. 10/17/15  Yes Steele SizerMark A Crissman, MD  vancomycin 1,000 mg in sodium chloride 0.9 % 250 mL Inject 1,000 mg into the vein every 12 (twelve) hours. 11/16/15  Yes Historical Provider, MD  Wound Dressings (MEDIHONEY WOUND/BURN DRESSING) GEL Apply topically daily. Apply to buttocks   Yes Historical Provider, MD    Anti-infectives    Start     Dose/Rate Route Frequency Ordered Stop   11/24/15 0730  vancomycin (VANCOCIN) IVPB 1000 mg/200 mL premix     1,000 mg 200 mL/hr over 60 Minutes Intravenous Every 36 hours 11/24/15 0720     11/23/15 1415  cefUROXime (ZINACEF) 1.5 g in dextrose 5 % 50 mL IVPB     1.5 g 100 mL/hr over 30 Minutes Intravenous  Once 11/23/15 1409 11/23/15 1430   11/22/15 1400  piperacillin-tazobactam (ZOSYN) IVPB 3.375 g      3.375 g 12.5 mL/hr over 240 Minutes Intravenous Every 8 hours 11/22/15 1032     11/22/15 0600  vancomycin (VANCOCIN) IVPB 1000 mg/200 mL premix  Status:  Discontinued     1,000 mg 200 mL/hr over 60 Minutes Intravenous Every 12 hours 11/21/15 2012 11/23/15 0622   11/21/15 2200  vancomycin (VANCOCIN) powder 1,000 mg  Status:  Discontinued     1,000 mg Other Every 12 hours 11/21/15 1655 11/21/15 1712   11/21/15 2000  piperacillin-tazobactam (ZOSYN) IVPB 4.5 g  Status:  Discontinued     4.5 g  25 mL/hr over 240 Minutes Intravenous Every 8 hours 11/21/15 1735 11/22/15 1032   11/21/15 1830  piperacillin-tazobactam (ZOSYN) IVPB 3.375 g  Status:  Discontinued     3.375 g 12.5 mL/hr over 240 Minutes Intravenous Every 8 hours 11/21/15 1655 11/21/15 1735   11/21/15 1215  vancomycin (VANCOCIN) IVPB 1000 mg/200 mL premix     1,000 mg 200 mL/hr over 60 Minutes Intravenous  Once 11/21/15 1210 11/21/15 1346   11/21/15 1215  piperacillin-tazobactam (ZOSYN) IVPB 3.375 g     3.375 g 100 mL/hr over 30 Minutes Intravenous  Once 11/21/15 1210 11/21/15 1346      Scheduled Meds: . amiodarone  100 mg Oral BID  . aspirin EC  81 mg Oral Daily  . carbidopa-levodopa  1 tablet Oral QID  . clopidogrel  75 mg Oral Daily  . colchicine  0.6 mg Oral Daily  . cyanocobalamin  1,000 mcg Intramuscular Q30 days  . enoxaparin (LOVENOX) injection  40 mg Subcutaneous Q24H  . feeding supplement (GLUCERNA SHAKE)  237 mL Oral TID BM  . furosemide  40 mg Oral Daily  . hydrocerin   Topical BID  . insulin aspart  0-5 Units Subcutaneous QHS  . insulin aspart  0-9 Units Subcutaneous TID WC  . levothyroxine  150 mcg Oral QAC breakfast  . linagliptin  5 mg Oral Daily  . piperacillin-tazobactam (ZOSYN)  IV  3.375 g Intravenous Q8H  . potassium chloride  20 mEq Oral BID  . pramipexole  0.5 mg Oral TID  . tamsulosin  0.4 mg Oral QPC supper  . terbinafine   Topical BID  . vancomycin  1,000 mg Intravenous Q36H   Continuous  Infusions: . sodium chloride 75 mL/hr at 11/24/15 2259  No Known Allergies  Physical Exam: He is alert and well-oriented with his son and daughter  Vitals  Blood pressure 118/58, pulse 72, temperature 97 F (36.1 C), temperature source Oral, resp. rate 18, height 6' (1.829 m), weight 82.555 kg (182 lb), SpO2 90 %.  Lower Extremity exam: The wound VAC is intact in place and functioning well left. The right heel ulcer is checked today appears to be stable it's mostly a superficial blister this drying but there is one area approximately 1 cm diameter wears probably little deeper tissue damage but I think be better off leaving the skin intact across that region civil filaments granulating.  Data Review  CBC  Recent Labs Lab 11/21/15 1236 11/22/15 0340 11/23/15 0541 11/25/15 0500  WBC 9.7 9.1 9.3 7.2  HGB 11.8* 11.7* 11.9* 10.1*  HCT 35.6* 35.2* 34.8* 30.0*  PLT 420 411 387 248  MCV 90.2 90.8 89.1 89.5  MCH 29.9 30.3 30.5 30.2  MCHC 33.1 33.4 34.2 33.7  RDW 15.7* 15.4* 15.6* 15.6*  LYMPHSABS 1.1  --   --   --   MONOABS 0.8  --   --   --   EOSABS 0.2  --   --   --   BASOSABS 0.1  --   --   --    ------------------------------------------------------------------------------------------------------------------  Chemistries   Recent Labs Lab 11/21/15 1236 11/22/15 0340 11/23/15 0541 11/25/15 0500  NA 138 140 139 138  K 3.2* 3.6 3.8 3.4*  CL 102 104 103 108  CO2 GLUCOSE 242* 195* 157* 118*  BUN 21* CREATININE 1.12 1.18 1.18 1.03  CALCIUM 8.9 8.6* 8.7* 8.2*  MG  --   --  1.9 1.8   -------  Assessment & Plan: Patient stable to allow both feet at this point. Plan: Leave the wound VAC intact change padding to right heel continue Esterly boots on both and try to continue to alleviate any pressure on the heels of both. Right now he scheduled for [facility] and they can handle getting the [facility] wound VAC placement as  well.  Active Problems:   Heel ulcer (HCC)     Family Communication: Plan discussed with patient and family    Epimenio SarinROXLER,Justyce Baby G M.D on 11/25/2015 at 11:58 AM

## 2015-11-25 NOTE — Progress Notes (Addendum)
Received report from specials. Patient to lay flat until 2100 following angiogram.Left groin star closure with tegaderm. Dressing clean, dry & intact.

## 2015-11-26 LAB — BASIC METABOLIC PANEL
Anion gap: 5 (ref 5–15)
BUN: 14 mg/dL (ref 6–20)
CALCIUM: 8.4 mg/dL — AB (ref 8.9–10.3)
CO2: 25 mmol/L (ref 22–32)
CREATININE: 1.1 mg/dL (ref 0.61–1.24)
Chloride: 111 mmol/L (ref 101–111)
GFR calc Af Amer: >60 mL/min (ref >60)
GFR, EST NON AFRICAN AMERICAN: 58 mL/min — AB (ref >60)
GLUCOSE: 142 mg/dL — AB (ref 65–99)
Potassium: 3.7 mmol/L (ref 3.5–5.1)
Sodium: 141 mmol/L (ref 135–145)

## 2015-11-26 LAB — GLUCOSE, CAPILLARY
GLUCOSE-CAPILLARY: 209 mg/dL — AB (ref 65–99)
GLUCOSE-CAPILLARY: 202 mg/dL — AB (ref 65–99)
Glucose-Capillary: 138 mg/dL — ABNORMAL HIGH (ref 65–99)
Glucose-Capillary: 179 mg/dL — ABNORMAL HIGH (ref 65–99)

## 2015-11-26 LAB — CULTURE, BLOOD (ROUTINE X 2)
CULTURE: NO GROWTH
Culture: NO GROWTH

## 2015-11-26 NOTE — Progress Notes (Signed)
[facility] [place] [facility]   PATIENT NAME: [name]    MR#:  [id number]  DATE OF BIRTH:  10-01-1924  SUBJECTIVE:  CHIEF COMPLAINT:   Chief Complaint  Patient presents with  . Foot Injury   - s/p debridement of left heel eschar 11/24/15, for right leg angiogram on 11/25/15 - has a wound vac on left foot - no other complaints  REVIEW OF SYSTEMS:  Review of Systems  Constitutional: Positive for malaise/fatigue. Negative for fever and chills.  HENT: Positive for hearing loss. Negative for ear discharge, ear pain and nosebleeds.   Eyes: Negative for blurred vision and double vision.  Respiratory: Negative for cough, shortness of breath and wheezing.   Cardiovascular: Negative for chest pain, palpitations and leg swelling.  Gastrointestinal: Negative for nausea, vomiting, abdominal pain, diarrhea and constipation.  Genitourinary: Negative for dysuria and hematuria.  Musculoskeletal: Positive for myalgias.  Neurological: Negative for dizziness, speech change, focal weakness, seizures and headaches.  Psychiatric/Behavioral: Negative for depression.    DRUG ALLERGIES:  No Known Allergies  VITALS:  Blood pressure 120/62, pulse 61, temperature 97.1 F (36.2 C), temperature source Axillary, resp. rate 17, height 6' (1.829 m), weight 82.555 kg (182 lb), SpO2 96 %.  PHYSICAL EXAMINATION:  Physical Exam  GENERAL:  82 y.o.-year-old elderly patient lying in the bed with no acute distress.  EYES: Pupils equal, round, reactive to light and accommodation. No scleral icterus. Extraocular muscles intact.  HEENT: Head atraumatic, normocephalic. Oropharynx and nasopharynx clear.  NECK:  Supple, no jugular venous distention. No thyroid enlargement, no tenderness.  LUNGS: Normal breath sounds bilaterally, no wheezing, rales,rhonchi or crepitation. No use of accessory muscles of respiration. Decreased bibasilar breath sounds CARDIOVASCULAR: S1, S2 normal. No  rubs, or gallops. 3/6 systolic murmur present ABDOMEN: Soft, nontender, nondistended. Bowel sounds present. No organomegaly or mass.  EXTREMITIES: Left foot in dressing, both in heel floats, wound vac in place on left side, likely fungal nail infection of hand nails NEUROLOGIC: Cranial nerves II through XII are intact. Muscle strength 5/5 in all extremities. Sensation intact. Gait not checked.  PSYCHIATRIC: The patient is alert and oriented x 3.  SKIN: No obvious rash, lesion, or ulcer.    LABORATORY PANEL:   CBC  Recent Labs Lab 11/25/15 0500  WBC 7.2  HGB 10.1*  HCT 30.0*  PLT 248   ------------------------------------------------------------------------------------------------------------------  Chemistries   Recent Labs Lab 11/25/15 0500 11/26/15 0600  NA 138 141  K 3.4* 3.7  CL 108 111  CO2 26 25  GLUCOSE 118* 142*  BUN 17 14  CREATININE 1.03 1.10  CALCIUM 8.2* 8.4*  MG 1.8  --    ------------------------------------------------------------------------------------------------------------------  Cardiac Enzymes No results for input(s): TROPONINI in the last 168 hours. ------------------------------------------------------------------------------------------------------------------  RADIOLOGY:  No results found.  EKG:   Orders placed or performed in visit on 10/28/15  . EKG 12-Lead    ASSESSMENT AND PLAN:   82 year old male with past medical history of peripheral vascular disease, coronary artery disease, diabetes type 2 without, patient, history of aortic stenosis, hypertension, hyperlipidemia, history of previous TIA, hypothyroidism admitted [facility] with left heel ulcer  1. Left heel ulcer with recent left first ray amputation- has a PICC line and was on IV ABX at the rehab -Appreciate podiatry and vascular consults. -s/p angiogram of left leg with angioplasty done. And debridement of left heel ulcer by Podiatry done and has wound vac  on. - Continue empiric antibiotics with  vancomycin and Zosyn- had MRSA and proteus in the wound 2 weeks ago - ID consulted for IV ABX- for now cont until 12/09/15.  - Right leg angiogram with angioplasty done 11/25/15, no further debridements planned  2. History of Parkinson's disease-continue Sinemet.  3. Atrial fibrillation-on amiodarone. Continue low-dose aspirin. Rate controlled  4. Hypothyroidism-continue Synthroid.  5. Chronic venous insufficiency-patient is on Lasix at home. Decreased the dose. Leg wrappings, keep the legs elevated if needed. Last echo from February 2016 with normal ejection fraction.  6. Restless leg syndrome-continue Mirapex.   7. BPH-continue Flomax.  8. Hypothyroidism-continue Synthroid.   Physical therapy consulted- needs rehab Wants to go [facility]- likely Monday   All the records are reviewed and case discussed with Care Management/Social Workerr. Management plans discussed with the patient, family and they are in agreement.  CODE STATUS: DNR  TOTAL TIME TAKING CARE OF THIS PATIENT: 35 minutes.   POSSIBLE D/C IN 2 DAYS, DEPENDING ON CLINICAL CONDITION.   Altamese DillingVACHHANI, Kimberlie Csaszar M.D on 11/26/2015 at 8:51 PM  Between 7am to 6pm - Pager - [phone]  After 6pm go to [email or web address] - password [email or web address] [email or web address]  [facility]  Office  [phone]  CC: Primary care physician; Tonette LedererKIMBERLY A STEGMAYER, PA-C

## 2015-11-26 NOTE — Progress Notes (Signed)
[facility]  Daily Progress Note   Subjective: 1 Day Post-Op: Introduction catheter into right lower extremity 3rd order catheter placement, Contrast injection right lower extremity for distal runoff, percutaneous transluminal angioplasty right posterior tibial to 3 mm, Aspiration thrombectomy right popliteal and TP trunk arteries with the Priority One AC catheter,  Aspiration thrombectomy right posterior tibial artery with the Priority One AC catheter, with Closure left common femoral artery with the Star close device  3 Day Post-Op: Introduction catheter into left lower extremity 3rd order catheter placement, Contrast injection left lower extremity for distal runoff, Percutaneous transluminal angioplasty left posterior tibial artery to 3 mm with Star close closure right common femoral arteriotomy.  Patient seen and examined with daughter at bedside. Patient without complaint. Resting comfortably in bed.   Objective: Filed Vitals:   11/25/15 1943 11/25/15 2041 11/26/15 0435 11/26/15 0812  BP: 153/68 148/52 129/56 150/72  Pulse: 65 56 61 63  Temp: 97.6 F (36.4 C) 98.6 F (37 C) 97.8 F (36.6 C) 97.9 F (36.6 C)  TempSrc: Oral Oral Oral Oral  Resp: 18 19 18 20   Height:      Weight:      SpO2: 97% 98% 94% 93%    Intake/Output Summary (Last 24 hours) at 11/26/15 1325 Last data filed at 11/26/15 1100  Gross per 24 hour  Intake   1850 ml  Output    850 ml  Net   1000 ml   Physical Exam: A&Ox3, NAD CV: RRR Pulmonary: CTA Bilaterally Abdomen: Soft, Nontender, Nondistended Vascular:  Bilateral Groins: Accesses without drainage or swelling.   Bilateral Lower Extremity: warm, nontender, minimal edema.   Bilateral Feet: In wound dressings. In boots.    Laboratory: CBC    Component Value Date/Time   WBC 7.2 11/25/2015 0500   WBC 10.0 09/27/2015 1506   HGB 10.1* 11/25/2015 0500   HGB 13.5 07/18/2013 0356   HCT 30.0* 11/25/2015 0500   HCT 41.8  09/27/2015 1506   PLT 248 11/25/2015 0500   PLT 187 09/27/2015 1506   PLT 148* 07/18/2013 0356   BMET    Component Value Date/Time   NA 141 11/26/2015 0600   NA 142 09/27/2015 1506   NA 139 10/30/2013 0715   K 3.7 11/26/2015 0600   K 4.2 10/30/2013 0715   CL 111 11/26/2015 0600   CL 104 10/30/2013 0715   CO2 25 11/26/2015 0600   CO2 29 10/30/2013 0715   GLUCOSE 142* 11/26/2015 0600   GLUCOSE 175* 09/27/2015 1506   GLUCOSE 162* 10/30/2013 0715   BUN 14 11/26/2015 0600   BUN 24 09/27/2015 1506   BUN 16 10/30/2013 0715   CREATININE 1.10 11/26/2015 0600   CREATININE 1.15 10/30/2013 0715   CREATININE 1.34 10/21/2012 1057   CALCIUM 8.4* 11/26/2015 0600   CALCIUM 9.2 10/30/2013 0715   GFRNONAA 58* 11/26/2015 0600   GFRNONAA 57* 10/30/2013 0715   GFRAA >60 11/26/2015 0600   GFRAA >60 10/30/2013 0715   Assessment/Planning: 82 year old male now s/p bilateral lower extremity angiograms with intervention to revascularize extremities in an effort to increase blood flow for bilateral lower extremity ulcerations. 1) Successful revascularization of both lower extremities.  2) Surgical management of foot ulcerations as per podiatry. 3) Medical management as per medicine service.  Cleda DaubKimberly Bryson Palen PA-C 11/26/2015 1:25 PM

## 2015-11-27 LAB — GLUCOSE, CAPILLARY
GLUCOSE-CAPILLARY: 198 mg/dL — AB (ref 65–99)
Glucose-Capillary: 117 mg/dL — ABNORMAL HIGH (ref 65–99)
Glucose-Capillary: 188 mg/dL — ABNORMAL HIGH (ref 65–99)
Glucose-Capillary: 132 mg/dL — ABNORMAL HIGH (ref 65–99)

## 2015-11-27 NOTE — Progress Notes (Signed)
Patient Demographics  [name], is a 82 y.o. male   MRN: [id number]   DOB - 21-Oct-1924  Admit Date - 11/21/2015    Outpatient Primary MD for the patient is Tonette Lederer, PA-C  Consult requested in the Hospital by Altamese Dilling, MD, On 11/27/2015   With History of -  Past Medical History  Diagnosis Date  . Coronary artery disease   . Diabetes mellitus without complication (HCC)   . Aortic stenosis   . Hypertension   . Hyperlipidemia   . TIA (transient ischemic attack)   . Thyroid disease   . Hypothyroidism       Past Surgical History  Procedure Laterality Date  . Appendectomy    . Hernia repair    . Knee surgery      bilateral  . Eye surgery    . Colonoscopy    . Cardiac catheterization  2010    showed a patient circumflex stent with noncritical CAD and normal right-sided pressures.  . Coronary angioplasty  01/24/2007    stent placement to the mid circumflex   . Knee surgery Left   . Peripheral vascular catheterization N/A 08/30/2015    Procedure: Abdominal Aortogram w/Lower Extremity;  Surgeon: Renford Dills, MD;  Location: [facility] INVASIVE CV LAB;  Service: Cardiovascular;  Laterality: N/A;  . Peripheral vascular catheterization  08/30/2015    Procedure: Lower Extremity Intervention;  Surgeon: Renford Dills, MD;  Location: [facility] INVASIVE CV LAB;  Service: Cardiovascular;;  . Amputation toe Left 11/05/2015    Procedure: AMPUTATION TOE;  Surgeon: Recardo Evangelist, DPM;  Location: [facility] ORS;  Service: Podiatry;  Laterality: Left;  . Irrigation and debridement foot Left 11/05/2015    Procedure: IRRIGATION AND DEBRIDEMENT FOOT / HEEL ULCER;  Surgeon: Recardo Evangelist, DPM;  Location: [facility] ORS;  Service: Podiatry;  Laterality: Left;  . Application of wound vac Left 11/05/2015    Procedure: APPLICATION OF WOUND VAC;   Surgeon: Recardo Evangelist, DPM;  Location: [facility] ORS;  Service: Podiatry;  Laterality: Left;  . Peripheral vascular catheterization Left 11/23/2015    Procedure: Abdominal Aortogram w/Lower Extremity;  Surgeon: Renford Dills, MD;  Location: [facility] INVASIVE CV LAB;  Service: Cardiovascular;  Laterality: Left;  . Peripheral vascular catheterization  11/23/2015    Procedure: Lower Extremity Intervention;  Surgeon: Renford Dills, MD;  Location: [facility] INVASIVE CV LAB;  Service: Cardiovascular;;  . Irrigation and debridement foot Left 11/24/2015    Procedure: IRRIGATION AND DEBRIDEMENT FOOT;  Surgeon: Recardo Evangelist, DPM;  Location: [facility] ORS;  Service: Podiatry;  Laterality: Left;    in for   Chief Complaint  Patient presents with  . Foot Injury     HPI  [name]  is a 82 y.o. male, 2 days status post debridement of posterior left heel ulceration with application of wound VAC. Doing well with minimal pain.  Social History Social History  Substance Use Topics  . Smoking status: Never Smoker   . Smokeless tobacco: Never Used  . Alcohol Use: No     Family History Family History  Problem Relation Age of Onset  . Heart disease Mother   . Heart attack Father 34  MI  . Heart attack Brother [id number]    MI     Prior to Admission medications   Medication Sig Start Date End Date Taking? Authorizing Provider  amiodarone (PACERONE) 200 MG tablet Take 100 mg by mouth daily.   Yes Historical Provider, MD  aspirin EC 81 MG tablet Take 1 tablet (81 mg total) by mouth daily. 08/30/15  Yes Renford DillsGregory G Schnier, MD  carbidopa-levodopa (SINEMET IR) 25-100 MG per tablet Take 1 tablet by mouth 4 (four) times daily.   Yes Historical Provider, MD  clopidogrel (PLAVIX) 75 MG tablet Take 75 mg by mouth daily.   Yes Historical Provider, MD  colchicine 0.6 MG tablet Take 0.6 mg by mouth daily. For 10 days. 11/18/15 11/28/15 Yes Historical Provider, MD  cyanocobalamin (,VITAMIN B-12,) 1000 MCG/ML injection  Inject 1 mL (1,000 mcg total) into the muscle every 30 (thirty) days. 1 injection in 2 weeks then every 30 days 09/19/15  Yes Steele SizerMark A Crissman, MD  feeding supplement, GLUCERNA SHAKE, (GLUCERNA SHAKE) LIQD Take 237 mLs by mouth 3 (three) times daily between meals. 11/09/15  Yes Auburn BilberryShreyang Patel, MD  furosemide (LASIX) 40 MG tablet Take 1 tablet (40 mg total) by mouth 2 (two) times daily. 07/29/15  Yes Antonieta Ibaimothy J Gollan, MD  levothyroxine (SYNTHROID, LEVOTHROID) 150 MCG tablet Take 150 mcg by mouth daily before breakfast.   Yes Historical Provider, MD  oxyCODONE-acetaminophen (PERCOCET/ROXICET) 5-325 MG tablet Take 1 tablet by mouth every 4 (four) hours as needed for moderate pain. 11/09/15  Yes Auburn BilberryShreyang Patel, MD  potassium chloride (K-DUR,KLOR-CON) 10 MEQ tablet Take 10 mEq by mouth daily.  10/07/15  Yes Historical Provider, MD  pramipexole (MIRAPEX) 1 MG tablet Take 0.5 mg by mouth 4 (four) times daily.    Yes Historical Provider, MD  sitaGLIPtin (JANUVIA) 100 MG tablet Take 1 tablet (100 mg total) by mouth daily. 07/11/15  Yes Steele SizerMark A Crissman, MD  tamsulosin (FLOMAX) 0.4 MG CAPS capsule Take 1 capsule (0.4 mg total) by mouth daily after supper. 10/17/15  Yes Steele SizerMark A Crissman, MD  vancomycin 1,000 mg in sodium chloride 0.9 % 250 mL Inject 1,000 mg into the vein every 12 (twelve) hours. 11/16/15  Yes Historical Provider, MD  Wound Dressings (MEDIHONEY WOUND/BURN DRESSING) GEL Apply topically daily. Apply to buttocks   Yes Historical Provider, MD    Anti-infectives    Start     Dose/Rate Route Frequency Ordered Stop   11/26/15 1800  vancomycin (VANCOCIN) IVPB 1000 mg/200 mL premix     1,000 mg 200 mL/hr over 60 Minutes Intravenous Every 24 hours 11/25/15 2207     11/25/15 1515  cefUROXime (ZINACEF) 1.5 g in dextrose 5 % 50 mL IVPB     1.5 g 100 mL/hr over 30 Minutes Intravenous  Once 11/25/15 1500 11/25/15 1531   11/24/15 0730  vancomycin (VANCOCIN) IVPB 1000 mg/200 mL premix  Status:  Discontinued     1,000  mg 200 mL/hr over 60 Minutes Intravenous Every 36 hours 11/24/15 0720 11/25/15 2207   11/23/15 1415  cefUROXime (ZINACEF) 1.5 g in dextrose 5 % 50 mL IVPB     1.5 g 100 mL/hr over 30 Minutes Intravenous  Once 11/23/15 1409 11/23/15 1430   11/22/15 1400  piperacillin-tazobactam (ZOSYN) IVPB 3.375 g     3.375 g 12.5 mL/hr over 240 Minutes Intravenous Every 8 hours 11/22/15 1032     11/22/15 0600  vancomycin (VANCOCIN) IVPB 1000 mg/200 mL premix  Status:  Discontinued  1,000 mg 200 mL/hr over 60 Minutes Intravenous Every 12 hours 11/21/15 2012 11/23/15 0622   11/21/15 2200  vancomycin (VANCOCIN) powder 1,000 mg  Status:  Discontinued     1,000 mg Other Every 12 hours 11/21/15 1655 11/21/15 1712   11/21/15 2000  piperacillin-tazobactam (ZOSYN) IVPB 4.5 g  Status:  Discontinued     4.5 g 25 mL/hr over 240 Minutes Intravenous Every 8 hours 11/21/15 1735 11/22/15 1032   11/21/15 1830  piperacillin-tazobactam (ZOSYN) IVPB 3.375 g  Status:  Discontinued     3.375 g 12.5 mL/hr over 240 Minutes Intravenous Every 8 hours 11/21/15 1655 11/21/15 1735   11/21/15 1215  vancomycin (VANCOCIN) IVPB 1000 mg/200 mL premix     1,000 mg 200 mL/hr over 60 Minutes Intravenous  Once 11/21/15 1210 11/21/15 1346   11/21/15 1215  piperacillin-tazobactam (ZOSYN) IVPB 3.375 g     3.375 g 100 mL/hr over 30 Minutes Intravenous  Once 11/21/15 1210 11/21/15 1346      Scheduled Meds: . amiodarone  100 mg Oral BID  . aspirin EC  81 mg Oral Daily  . carbidopa-levodopa  1 tablet Oral QID  . clopidogrel  75 mg Oral Daily  . colchicine  0.6 mg Oral Daily  . cyanocobalamin  1,000 mcg Intramuscular Q30 days  . enoxaparin (LOVENOX) injection  40 mg Subcutaneous Q24H  . feeding supplement (GLUCERNA SHAKE)  237 mL Oral TID BM  . furosemide  40 mg Oral Daily  . hydrocerin   Topical BID  . insulin aspart  0-5 Units Subcutaneous QHS  . insulin aspart  0-9 Units Subcutaneous TID WC  . levothyroxine  150 mcg Oral QAC  breakfast  . linagliptin  5 mg Oral Daily  . piperacillin-tazobactam (ZOSYN)  IV  3.375 g Intravenous Q8H  . potassium chloride  20 mEq Oral BID  . pramipexole  0.5 mg Oral TID  . tamsulosin  0.4 mg Oral QPC supper  . terbinafine   Topical BID  . vancomycin  1,000 mg Intravenous Q24H   Continuous Infusions: . sodium chloride 75 mL/hr at 11/26/15 2217   PRN Meds:.acetaminophen **OR** acetaminophen, ondansetron **OR** ondansetron (ZOFRAN) IV, oxyCODONE-acetaminophen  No Known Allergies  Physical Exam  Vitals  Blood pressure 140/72, pulse 65, temperature 97.5 F (36.4 C), temperature source Oral, resp. rate 16, height 6' (1.829 m), weight 82.555 kg (182 lb), SpO2 96 %.  Lower Extremity exam:Wound VAC removed today the heels bleeding well and has apparently good granular tissue. The incision margin along the medial foot is well-healed and I'll go and remove the sutures on that today. No evidence of infection in the areas.   CBC  Recent Labs Lab 11/21/15 1236 11/22/15 0340 11/23/15 0541 11/25/15 0500  WBC 9.7 9.1 9.3 7.2  HGB 11.8* 11.7* 11.9* 10.1*  HCT 35.6* 35.2* 34.8* 30.0*  PLT 420 411 387 248  MCV 90.2 90.8 89.1 89.5  MCH 29.9 30.3 30.5 30.2  MCHC 33.1 33.4 34.2 33.7  RDW 15.7* 15.4* 15.6* 15.6*  LYMPHSABS 1.1  --   --   --   MONOABS 0.8  --   --   --   EOSABS 0.2  --   --   --   BASOSABS 0.1  --   --   --    ------------------------------------------------------------------------------------------------------------------  Chemistries   Recent Labs Lab 11/21/15 1236 11/22/15 0340 11/23/15 0541 11/25/15 0500 11/26/15 0600  NA 138 140 139 138 141  K 3.2* 3.6 3.8 3.4* 3.7  CL 102 104 103 108 111  CO2 27 28 28 26 25   GLUCOSE 242* 195* 157* 118* 142*  BUN 21* 20 20 17 14   CREATININE 1.12 1.18 1.18 1.03 1.10  CALCIUM 8.9 8.6* 8.7* 8.2* 8.4*  MG  --   --  1.9 1.8  --     Assessment & Plan: Patient appears to be stable with both the posterior left heel and  the incision margin from his first ray amputation from 3 weeks ago. Change wound VAC on that area today under sterile technique he tolerated that well. Remove sutures today as well. Return in 10 days to my office for reevaluation. Okay from my standpoint for discharge tomorrow. I think he is going to go the [facility].  Active Problems:   Heel ulcer (HCC)     Family Communication: Plan discussed with patient and son.  Epimenio Sarin M.D on 11/27/2015 at 10:35 AM  Thank you for the consult, we will follow the patient with you in the Hospital.

## 2015-11-27 NOTE — Progress Notes (Signed)
Assisted Dr. Orland Jarredroxler with wound vac and bandage change. Doctor advised nurse if wound vac reaches 100-150 of blood in cylinder to contact him. He will most likely change setting to intermittent if that happens.

## 2015-11-27 NOTE — Progress Notes (Signed)
[facility] [place] at [facility]   PATIENT NAME: [name]    MR#:  [id number]  DATE OF BIRTH:  [birth year]  SUBJECTIVE:  CHIEF COMPLAINT:   Chief Complaint  Patient presents with  . Foot Injury   - s/p debridement of left heel eschar 11/24/15, for right leg angiogram on 11/25/15 - has a wound vac on left foot - no other complaints - dresing changed by podiatry today, and as per him it looks well.  REVIEW OF SYSTEMS:  Review of Systems  Constitutional: Positive for malaise/fatigue. Negative for fever and chills.  HENT: Positive for hearing loss. Negative for ear discharge, ear pain and nosebleeds.   Eyes: Negative for blurred vision and double vision.  Respiratory: Negative for cough, shortness of breath and wheezing.   Cardiovascular: Negative for chest pain, palpitations and leg swelling.  Gastrointestinal: Negative for nausea, vomiting, abdominal pain, diarrhea and constipation.  Genitourinary: Negative for dysuria and hematuria.  Musculoskeletal: Positive for myalgias.  Neurological: Negative for dizziness, speech change, focal weakness, seizures and headaches.  Psychiatric/Behavioral: Negative for depression.    DRUG ALLERGIES:  No Known Allergies  VITALS:  Blood pressure 140/72, pulse 65, temperature 97.5 F (36.4 C), temperature source Oral, resp. rate 16, height 6' (1.829 m), weight 82.555 kg (182 lb), SpO2 96 %.  PHYSICAL EXAMINATION:  Physical Exam  GENERAL:  82 y.o.-year-old elderly patient lying in the bed with no acute distress.  EYES: Pupils equal, round, reactive to light and accommodation. No scleral icterus. Extraocular muscles intact.  HEENT: Head atraumatic, normocephalic. Oropharynx and nasopharynx clear.  NECK:  Supple, no jugular venous distention. No thyroid enlargement, no tenderness.  LUNGS: Normal breath sounds bilaterally, no wheezing, rales,rhonchi or crepitation. No use of accessory muscles of respiration.  Decreased bibasilar breath sounds CARDIOVASCULAR: S1, S2 normal. No rubs, or gallops. 3/6 systolic murmur present ABDOMEN: Soft, nontender, nondistended. Bowel sounds present. No organomegaly or mass.  EXTREMITIES: Left foot in dressing, both in heel floats, wound vac in place on left side, likely fungal nail infection of hand nails   B/l legs warm. NEUROLOGIC: Cranial nerves II through XII are intact. Muscle strength 5/5 in all extremities. Sensation intact. Gait not checked.  PSYCHIATRIC: The patient is alert and oriented x 3.  SKIN: No obvious rash, lesion, or ulcer.    LABORATORY PANEL:   CBC  Recent Labs Lab 11/25/15 0500  WBC 7.2  HGB 10.1*  HCT 30.0*  PLT 248   ------------------------------------------------------------------------------------------------------------------  Chemistries   Recent Labs Lab 11/25/15 0500 11/26/15 0600  NA 138 141  K 3.4* 3.7  CL 108 111  CO2 26 25  GLUCOSE 118* 142*  BUN 17 14  CREATININE 1.03 1.10  CALCIUM 8.2* 8.4*  MG 1.8  --    ------------------------------------------------------------------------------------------------------------------  Cardiac Enzymes No results for input(s): TROPONINI in the last 168 hours. ------------------------------------------------------------------------------------------------------------------  RADIOLOGY:  No results found.  EKG:   Orders placed or performed in visit on 10/28/15  . EKG 12-Lead    ASSESSMENT AND PLAN:   82 year old male with past medical history of peripheral vascular disease, coronary artery disease, diabetes type 2 without, patient, history of aortic stenosis, hypertension, hyperlipidemia, history of previous TIA, hypothyroidism admitted [facility] with left heel ulcer  1. Left heel ulcer with recent left first ray amputation- has a PICC line and was on IV ABX at the rehab -Appreciate podiatry and vascular consults. -s/p angiogram of left leg with  angioplasty done. And  debridement of left heel ulcer by Podiatry done and has wound vac on. - Continue empiric antibiotics with vancomycin and Zosyn- had MRSA and proteus in the wound 2 weeks ago - ID consulted for IV ABX- for now cont until 12/09/15.  - Right leg angiogram with angioplasty done 11/25/15, no further debridements planned  Podiatry changed dressing today and cleared for discharge.  2. History of Parkinson's disease-continue Sinemet.  3. Atrial fibrillation-on amiodarone. Continue low-dose aspirin. Rate controlled  4. Hypothyroidism-continue Synthroid.  5. Chronic venous insufficiency-patient is on Lasix at home. Decreased the dose. Leg wrappings, keep the legs elevated if needed. Last echo from February 2016 with normal ejection fraction.  6. Restless leg syndrome-continue Mirapex.   7. BPH-continue Flomax.  8. Hypothyroidism-continue Synthroid.  Physical therapy consulted- needs rehab Wants to go [facility]- likely Monday   All the records are reviewed and case discussed with Care Management/Social Workerr. Management plans discussed with the patient, family and they are in agreement.  CODE STATUS: DNR  TOTAL TIME TAKING CARE OF THIS PATIENT: 35 minutes.   POSSIBLE D/C IN 1 DAYS, DEPENDING ON CLINICAL CONDITION.   Altamese DillingVACHHANI, Vanice Rappa M.D on 11/27/2015 at 1:35 PM  Between 7am to 6pm - Pager - [phone]  After 6pm go to [email or web address] - password [email or web address] [facility]  [facility]  Office  [phone]  CC: Primary care physician; Tonette LedererKIMBERLY A STEGMAYER, PA-C

## 2015-11-27 NOTE — Progress Notes (Signed)
Nurse tech noticed copious amounts of blood draining from patient's foot. Bandage removed to examine the area and it appears wound vac is not suctioning. Charge nurse called Dr. Orland Jarredroxler who said to wrap and he will come to the floor soon.

## 2015-11-28 ENCOUNTER — Encounter: Admission: RE | Admit: 2015-11-28 | Payer: PPO | Source: Ambulatory Visit | Admitting: Internal Medicine

## 2015-11-28 ENCOUNTER — Encounter
Admission: RE | Admit: 2015-11-28 | Discharge: 2015-11-28 | Disposition: A | Discharge status: Home / Self Care | Payer: PPO | Source: Ambulatory Visit | Attending: Internal Medicine | Admitting: Internal Medicine

## 2015-11-28 DIAGNOSIS — K920 Hematemesis: Secondary | ICD-10-CM | POA: Insufficient documentation

## 2015-11-28 DIAGNOSIS — E119 Type 2 diabetes mellitus without complications: Secondary | ICD-10-CM | POA: Insufficient documentation

## 2015-11-28 LAB — GLUCOSE, CAPILLARY
GLUCOSE-CAPILLARY: 104 mg/dL — AB (ref 65–99)
GLUCOSE-CAPILLARY: 164 mg/dL — AB (ref 65–99)
Glucose-Capillary: 140 mg/dL — ABNORMAL HIGH (ref 65–99)
Glucose-Capillary: 144 mg/dL — ABNORMAL HIGH (ref 65–99)

## 2015-11-28 LAB — CREATININE, SERUM
Creatinine, Ser: 1.1 mg/dL (ref 0.61–1.24)
GFR calc Af Amer: >60 mL/min (ref >60)
GFR calc non Af Amer: 58 mL/min — ABNORMAL LOW (ref >60)

## 2015-11-28 MED ORDER — PIPERACILLIN-TAZOBACTAM 3.375 G IVPB: 3.3750 g | Freq: Three times a day (TID) | INTRAVENOUS | Status: AC

## 2015-11-28 MED ORDER — VANCOMYCIN HCL IN DEXTROSE 1-5 GM/200ML-% IV SOLN: 1000.0000 mg | INTRAVENOUS | Status: AC

## 2015-11-28 NOTE — Clinical Social Work Placement (Signed)
   CLINICAL SOCIAL WORK PLACEMENT  NOTE  Date:  11/28/2015  Patient Details  Name: [name] MRN: [id number] Date of Birth: Sep 23, 1924  Clinical Social Work is seeking post-discharge placement for this patient at the Skilled  Nursing Facility level of care (*CSW will initial, date and re-position this form in  chart as items are completed):  Yes   Patient/family provided with [facility] Clinical Social Work Department's list of facilities offering this level of care within the geographic area requested by the patient (or if unable, by the patient's family).  Yes   Patient/family informed of their freedom to choose among providers that offer the needed level of care, that participate in Medicare, Medicaid or managed care program needed by the patient, have an available bed and are willing to accept the patient.  Yes   Patient/family informed of [facility]'s ownership interest in [facility] and [facility], as well as of the fact that they are under no obligation to receive care at these facilities.  PASRR submitted to EDS on       PASRR number received on       Existing PASRR number confirmed on 11/22/15     FL2 transmitted to all facilities in geographic area requested by pt/family on 11/22/15     FL2 transmitted to all facilities within larger geographic area on       Patient informed that his/her managed care company has contracts with or will negotiate with certain facilities, including the following:        Yes   Patient/family informed of bed offers received.  Patient chooses bed at  [facility] )     Physician recommends and patient chooses bed at      Patient to be transferred to  [facility] ) on 11/28/15.  Patient to be transferred to facility by  [facility] )     Patient family notified on 11/28/15 of transfer.  Name of family member notified:   (Patient's daughter Dewayne Hatchnn is at bedside and aware of D/C today. )     PHYSICIAN        Additional Comment:    _______________________________________________ Haig ProphetMorgan, Shadd Dunstan G, LCSW 11/28/2015, 11:55 AM

## 2015-11-28 NOTE — Progress Notes (Signed)
Called report to Al[place]quin[place]ia, LPN at [facility], going to rm 224. EMS called for transport. Belongings packed and given to patient and family. VSS at this time.

## 2015-11-28 NOTE — Progress Notes (Signed)
Patient is medically stable for [facility]. [facility] will [facility]. RN will call report at [phone] and arrange EMS for transport. [facility] RN will remove wound vac and [facility] at [facility]. Health Team authorization has been received. Auth # [id number]. Clinical Child psychotherapistocial Worker (CSW) sent D/C Summary, FL2 and D/C Packet to [employer] via [employer]. Patient and daughter Dewayne Hatchnn are aware of above. Please reconsult if future social work needs arise. CSW signing off.   Jetta LoutBailey Morgan, LCSW [phone]

## 2015-11-28 NOTE — Care Management Important Message (Signed)
Important Message  Patient Details  Name: [name] MRN: [id number] Date of Birth: March 14, 1925   Medicare Important Message Given:  Yes    Collie SiadAngela Talon Witting, RN 11/28/2015, 8:16 AM

## 2015-11-28 NOTE — Progress Notes (Signed)
Physical Therapy Treatment Patient Details Name: [name] MRN: [id number] DOB: 05-08-1925 Today's Date: 11/28/2015    History of Present Illness [name] is a 82 y.o. male with a known history of coronary artery disease, diabetes, aortic stenosis, hypertension, hyperlipidemia, thyroid disease, hypothyroidism- has chronic circulatory issue in both lower extremities and he had undergone surgery by vascular Dr.Schiener- successful restoration of the circulation on the right lower extremity but on the left after 3-4 surgeries there was not successful circulation up to his foot. He was recently admitted to the hospital early June for the same problems and underwent a L foot first ray amputation and debriedment. He returned to the ED from his podiatrist for a L heel ulcer that isn't improving. Pt has been at STR at [facility] since his hospital discharge from the previous admission this month.    PT Comments    Pt gradually progressing towards functional goals. He was able to tolerate more LE exercises and sitting at the EOB this session. He continues to require mod A +2 for bed mobility. Seated balance progressed from min A to min guard with cues for postural correction to improve ability to maintain balance. Pt remains NWB L LE. Plan to progress towards sliding board transfers as appropriate, while maintaining WBS.  Follow Up Recommendations  SNF     Equipment Recommendations       Recommendations for Other Services       Precautions / Restrictions Precautions Precautions: Fall Restrictions Weight Bearing Restrictions: Yes LLE Weight Bearing: Non weight bearing Other Position/Activity Restrictions: L foot in dressing s/p first ray amputation    Mobility  Bed Mobility Overal bed mobility: Needs Assistance;+2 for physical assistance Bed Mobility: Rolling;Supine to Sit;Sit to Supine Rolling: Mod assist   Supine to sit: Mod assist;+2 for physical assistance;HOB  elevated Sit to supine: Mod assist;+2 for physical assistance;HOB elevated   General bed mobility comments: cues for reaching for rail, and pushing with UEs to get trunk upright  Transfers                 General transfer comment: NT this session  Ambulation/Gait             General Gait Details: non-ambulatory at baseline   Stairs            Wheelchair Mobility    Modified Rankin (Stroke Patients Only)       Balance Overall balance assessment: Needs assistance Sitting-balance support: Bilateral upper extremity supported Sitting balance-Leahy Scale: Fair Sitting balance - Comments: cues for weight shift correction to improve balance, maintains with min guard with UE support Postural control: Posterior lean     Standing balance comment: NWB L LE, NT                    Cognition Arousal/Alertness: Awake/alert Behavior During Therapy: WFL for tasks assessed/performed Overall Cognitive Status: Within Functional Limits for tasks assessed                      Exercises Other Exercises Other Exercises: B LE supine therex: gentle ankle pumps, heel slides and hip abd slides; seated: LAQs, marching with AAROM for L LE x 10 each. Limited ROM due to knee flexion contractures B LE.  Other Exercises: Pt sat at EOB x5 minutes with min A progressing to min guard with B UE support. Cues for anterior weight shifting and lateral correction to improve balance.    General Comments General  comments (skin integrity, edema, etc.): poor LE skin integrity, B prevalon boots      Pertinent Vitals/Pain Pain Assessment: No/denies pain    Home Living                      Prior Function            PT Goals (current goals can now be found in the care plan section) Acute Rehab PT Goals Patient Stated Goal: Be able to perform sliding board transfers at home PT Goal Formulation: With patient/family Time For Goal Achievement: 12/07/15 Potential to  Achieve Goals: Fair Progress towards PT goals: Progressing toward goals    Frequency  Min 2X/week    PT Plan Current plan remains appropriate    Co-evaluation             End of Session   Activity Tolerance: Patient limited by fatigue Patient left: in bed;with call bell/phone within reach;with nursing/sitter in room;with family/visitor present;Other (comment)     Time: [id number] PT Time Calculation (min) (ACUTE ONLY): 25 min  Charges:  $Therapeutic Exercise: 8-22 mins $Therapeutic Activity: 8-22 mins                    G Codes:      Adelene IdlerMindy Jo Shanece Cochrane, PT, DPT  11/28/2015, 9:42 AM [phone]

## 2015-11-28 NOTE — Discharge Instructions (Signed)
Follow CBC, CMP, CRP and Vancomycin troph level weekly.

## 2015-11-28 NOTE — Discharge Summary (Signed)
[facility] [place] at [facility]   PATIENT NAME: [name]    MR#:  [id number]  DATE OF BIRTH:  1925-01-12  DATE OF ADMISSION:  11/21/2015 ADMITTING PHYSICIAN: Houston Siren, MD  DATE OF DISCHARGE: 11/28/2015  PRIMARY CARE PHYSICIAN: Tonette Lederer, PA-C    ADMISSION DIAGNOSIS:  Foot ulcer, left, with unspecified severity (HCC) [L97.529]  DISCHARGE DIAGNOSIS:  Active Problems:   Heel ulcer (HCC)   SECONDARY DIAGNOSIS:   Past Medical History  Diagnosis Date  . Coronary artery disease   . Diabetes mellitus without complication (HCC)   . Aortic stenosis   . Hypertension   . Hyperlipidemia   . TIA (transient ischemic attack)   . Thyroid disease   . Hypothyroidism     HOSPITAL COURSE:   82 year old male with past medical history of peripheral vascular disease, coronary artery disease, diabetes type 2 without, patient, history of aortic stenosis, hypertension, hyperlipidemia, history of previous TIA, hypothyroidism admitted from [facility] with left heel ulcer  1. Left heel ulcer with recent left first ray amputation- has a PICC line and was on IV ABX at the rehab -Appreciate podiatry and vascular consults. -s/p angiogram of left leg with angioplasty done. And debridement of left heel ulcer by Podiatry done and has wound vac on. - Continue empiric antibiotics with vancomycin and Zosyn- had MRSA and proteus in the wound 2 weeks ago - ID consulted for IV ABX- for now cont until 12/09/15.  - Right leg angiogram with angioplasty done 11/25/15, no further debridements planned Podiatry changed dressing and cleared for discharge.  2. History of Parkinson's disease-continue Sinemet.  3. Atrial fibrillation-on amiodarone. Continue low-dose aspirin. Rate controlled  4. Hypothyroidism-continue Synthroid.  5. Chronic venous insufficiency-patient is on Lasix at home. Decreased the dose. Leg wrappings, keep the legs elevated if needed.  Last echo from February 2016 with normal ejection fraction.  6. Restless leg syndrome-continue Mirapex.   7. BPH-continue Flomax.  8. Hypothyroidism-continue Synthroid.  Physical therapy consulted- needs rehab Wants to go [facility]-  DISCHARGE CONDITIONS:  Stable.  CONSULTS OBTAINED:  Treatment Team:  Renford Dills, MD Recardo Evangelist, DPM Mick Sell, MD  DRUG ALLERGIES:  No Known Allergies  DISCHARGE MEDICATIONS:   Current Discharge Medication List    START taking these medications   Details  vancomycin (VANCOCIN) 1-5 GM/200ML-% SOLN Inject 200 mLs (1,000 mg total) into the vein daily. Qty: 4000 mL, Refills: 12      CONTINUE these medications which have CHANGED   Details  piperacillin-tazobactam (ZOSYN) 3.375 GM/50ML IVPB Inject 50 mLs (3.375 g total) into the vein every 8 (eight) hours. Qty: 50 mL, Refills: 35      CONTINUE these medications which have NOT CHANGED   Details  amiodarone (PACERONE) 200 MG tablet Take 100 mg by mouth daily.    aspirin EC 81 MG tablet Take 1 tablet (81 mg total) by mouth daily. Qty: 150 tablet, Refills: 2    carbidopa-levodopa (SINEMET IR) 25-100 MG per tablet Take 1 tablet by mouth 4 (four) times daily.    clopidogrel (PLAVIX) 75 MG tablet Take 75 mg by mouth daily.    colchicine 0.6 MG tablet Take 0.6 mg by mouth daily. For 10 days.    cyanocobalamin (,VITAMIN B-12,) 1000 MCG/ML injection Inject 1 mL (1,000 mcg total) into the muscle every 30 (thirty) days. 1 injection in 2 weeks then every 30 days Qty: 1 mL, Refills: 12    feeding supplement,  GLUCERNA SHAKE, (GLUCERNA SHAKE) LIQD Take 237 mLs by mouth 3 (three) times daily between meals. Refills: 0    furosemide (LASIX) 40 MG tablet Take 1 tablet (40 mg total) by mouth 2 (two) times daily. Qty: 60 tablet, Refills: 6    levothyroxine (SYNTHROID, LEVOTHROID) 150 MCG tablet Take 150 mcg by mouth daily before breakfast.    oxyCODONE-acetaminophen  (PERCOCET/ROXICET) 5-325 MG tablet Take 1 tablet by mouth every 4 (four) hours as needed for moderate pain. Qty: 30 tablet, Refills: 0    potassium chloride (K-DUR,KLOR-CON) 10 MEQ tablet Take 10 mEq by mouth daily.     pramipexole (MIRAPEX) 1 MG tablet Take 0.5 mg by mouth 4 (four) times daily.     sitaGLIPtin (JANUVIA) 100 MG tablet Take 1 tablet (100 mg total) by mouth daily. Qty: 90 tablet, Refills: 1   Associated Diagnoses: Essential hypertension; Diabetes mellitus without complication (HCC)    tamsulosin (FLOMAX) 0.4 MG CAPS capsule Take 1 capsule (0.4 mg total) by mouth daily after supper. Qty: 30 capsule, Refills: 6      STOP taking these medications     predniSONE (DELTASONE) 20 MG tablet      vancomycin 1,000 mg in sodium chloride 0.9 % 250 mL      Wound Dressings (MEDIHONEY WOUND/BURN DRESSING) GEL          DISCHARGE INSTRUCTIONS:   Follow with Podiatry clinic in 10 days.  If you experience worsening of your admission symptoms, develop shortness of breath, life threatening emergency, suicidal or homicidal thoughts you must seek medical attention immediately by calling 911 or calling your MD immediately  if symptoms less severe.  You Must read complete instructions/literature along with all the possible adverse reactions/side effects for all the Medicines you take and that have been prescribed to you. Take any new Medicines after you have completely understood and accept all the possible adverse reactions/side effects.   Please note  You were cared for by a hospitalist during your hospital stay. If you have any questions about your discharge medications or the care you received while you were in the hospital after you are discharged, you can call the unit and asked to speak with the hospitalist on call if the hospitalist that took care of you is not available. Once you are discharged, your primary care physician will handle any further medical issues. Please note that  NO REFILLS for any discharge medications will be authorized once you are discharged, as it is imperative that you return to your primary care physician (or establish a relationship with a primary care physician if you do not have one) for your aftercare needs so that they can reassess your need for medications and monitor your lab values.    Today   CHIEF COMPLAINT:   Chief Complaint  Patient presents with  . Foot Injury    HISTORY OF PRESENT ILLNESS:  [name]  is a 82 y.o. male with a known history of Vascular disease, coronary artery disease, diabetes type 2 without complication, aortic stenosis, hypertension, hyperlipidemia, history of previous TIA, hypothyroidism who was sent to the ER from his podiatrist's office due to a left heel ulcer that is not improving. Patient was recently hospitalized for a vascular ulcer on the left foot and underwent fifth toe ray amputation. Patient was discharged to a skilled nursing facility with wound care and also minimal weightbearing on that left leg with frequent turning. Patient has developed a left heel ulcer at the facility due to  the fact that he was not being repositioned frequently. Patient's daughter took pictures of the ulcer and sent him to Dr. Orland Jarredroxler his podiatrist. The podiatrist and recommended that the patient be brought to the hospital for further debridement and also possible vascular surgery intervention. He denies any other complaints of fever, chills, nausea, vomiting, chest pain, abdominal pain or any other associated symptoms.   VITAL SIGNS:  Blood pressure 136/58, pulse 65, temperature 96.8 F (36 C), temperature source Oral, resp. rate 18, height 6' (1.829 m), weight 82.555 kg (182 lb), SpO2 94 %.  I/O:   Intake/Output Summary (Last 24 hours) at 11/28/15 0942 Last data filed at 11/28/15 0700  Gross per 24 hour  Intake      0 ml  Output   1225 ml  Net  -1225 ml    PHYSICAL EXAMINATION:   GENERAL: 82 y.o.-year-old  elderly patient lying in the bed with no acute distress.  EYES: Pupils equal, round, reactive to light and accommodation. No scleral icterus. Extraocular muscles intact.  HEENT: Head atraumatic, normocephalic. Oropharynx and nasopharynx clear.  NECK: Supple, no jugular venous distention. No thyroid enlargement, no tenderness.  LUNGS: Normal breath sounds bilaterally, no wheezing, rales,rhonchi or crepitation. No use of accessory muscles of respiration. Decreased bibasilar breath sounds CARDIOVASCULAR: S1, S2 normal. No rubs, or gallops. 3/6 systolic murmur present ABDOMEN: Soft, nontender, nondistended. Bowel sounds present. No organomegaly or mass.  EXTREMITIES: Left foot in dressing, both in heel floats, wound vac in place on left side, likely fungal nail infection of hand nails  B/l legs warm. NEUROLOGIC: Cranial nerves II through XII are intact. Muscle strength 5/5 in all extremities. Sensation intact. Gait not checked.  PSYCHIATRIC: The patient is alert and oriented x 3.  SKIN: No obvious rash, lesion, or ulcer.   DATA REVIEW:   CBC  Recent Labs Lab 11/25/15 0500  WBC 7.2  HGB 10.1*  HCT 30.0*  PLT 248    Chemistries   Recent Labs Lab 11/25/15 0500 11/26/15 0600 11/28/15 0434  NA 138 141  --   K 3.4* 3.7  --   CL 108 111  --   CO2 26 25  --   GLUCOSE 118* 142*  --   BUN 17 14  --   CREATININE 1.03 1.10 1.10  CALCIUM 8.2* 8.4*  --   MG 1.8  --   --     Cardiac Enzymes No results for input(s): TROPONINI in the last 168 hours.  Microbiology Results  Results for orders placed or performed during the hospital encounter of 11/21/15  Blood culture (routine x 2)     Status: None   Collection Time: 11/21/15 12:37 PM  Result Value Ref Range Status   Specimen Description BLOOD LEFT HAND  Final   Special Requests   Final    BOTTLES DRAWN AEROBIC AND ANAEROBIC  AERO 10CC ANA 7CC   Culture NO GROWTH 5 DAYS  Final   Report Status 11/26/2015 FINAL  Final  Blood  culture (routine x 2)     Status: None   Collection Time: 11/21/15 12:37 PM  Result Value Ref Range Status   Specimen Description BLOOD RIGHT FATTY CASTS  Final   Special Requests BOTTLES DRAWN AEROBIC AND ANAEROBIC  1CC  Final   Culture NO GROWTH 5 DAYS  Final   Report Status 11/26/2015 FINAL  Final    RADIOLOGY:  No results found.  EKG:   Orders placed or performed in visit on 10/28/15  .  EKG 12-Lead    Management plans discussed with the patient, family and they are in agreement.  CODE STATUS:     Code Status Orders        Start     Ordered   11/21/15 1656  Do not attempt resuscitation (DNR)   Continuous    Question Answer Comment  In the event of cardiac or respiratory ARREST Do not call a "code blue"   In the event of cardiac or respiratory ARREST Do not perform Intubation, CPR, defibrillation or ACLS   In the event of cardiac or respiratory ARREST Use medication by any route, position, wound care, and other measures to relive pain and suffering. May use oxygen, suction and manual treatment of airway obstruction as needed for comfort.      11/21/15 1655    Code Status History    Date Active Date Inactive Code Status Order ID Comments User Context   11/04/2015  2:00 PM 11/09/2015  7:33 PM Full Code [id number]  Altamese DillingVaibhavkumar Veronnica Hennings, MD Inpatient   11/04/2015 12:44 PM 11/04/2015  2:00 PM DNR [id number]  Altamese DillingVaibhavkumar Reign Dziuba, MD ED    Advance Directive Documentation        Most Recent Value   Type of Advance Directive  Healthcare Power of Attorney, Living will   Pre-existing out of facility DNR order (yellow form or pink MOST form)     "MOST" Form in Place?        TOTAL TIME TAKING CARE OF THIS PATIENT: 35 minutes.    Altamese DillingVACHHANI, Makynzie Dobesh M.D on 11/28/2015 at 9:42 AM  Between 7am to 6pm - Pager - [phone]  After 6pm go to [email or web address] - password [email or web address]  Office  [phone]  CC: Primary care physician; Tonette LedererKIMBERLY A STEGMAYER,  PA-C   Note: This dictation was prepared with Dragon dictation along with smaller phrase technology. Any transcriptional errors that result from this process are unintentional.

## 2015-11-29 ENCOUNTER — Inpatient Hospital Stay (HOSPITAL_COMMUNITY): Admission: EM | Admit: 2015-11-29 | Payer: PPO

## 2015-11-29 ENCOUNTER — Encounter: Payer: Self-pay | Admitting: Vascular Surgery

## 2015-11-29 ENCOUNTER — Inpatient Hospital Stay (HOSPITAL_COMMUNITY)
Admission: AD | Admit: 2015-11-29 | Discharge: 2015-12-04 | DRG: 369 | Disposition: A | Discharge status: Skilled Nursing Facility | Payer: PPO | Source: Other Acute Inpatient Hospital | Attending: Internal Medicine | Admitting: Internal Medicine

## 2015-11-29 ENCOUNTER — Emergency Department
Admission: EM | Admit: 2015-11-29 | Discharge: 2015-11-29 | Disposition: A | Discharge status: Other Acute Inpatient Hospital | Payer: PPO | Attending: Emergency Medicine | Admitting: Emergency Medicine

## 2015-11-29 DIAGNOSIS — I350 Nonrheumatic aortic (valve) stenosis: Secondary | ICD-10-CM | POA: Diagnosis present

## 2015-11-29 DIAGNOSIS — L97409 Non-pressure chronic ulcer of unspecified heel and midfoot with unspecified severity: Secondary | ICD-10-CM | POA: Diagnosis not present

## 2015-11-29 DIAGNOSIS — I110 Hypertensive heart disease with heart failure: Secondary | ICD-10-CM | POA: Diagnosis not present

## 2015-11-29 DIAGNOSIS — K2210 Ulcer of esophagus without bleeding: Secondary | ICD-10-CM | POA: Diagnosis present

## 2015-11-29 DIAGNOSIS — I5032 Chronic diastolic (congestive) heart failure: Secondary | ICD-10-CM | POA: Diagnosis present

## 2015-11-29 DIAGNOSIS — Z7982 Long term (current) use of aspirin: Secondary | ICD-10-CM | POA: Diagnosis not present

## 2015-11-29 DIAGNOSIS — E785 Hyperlipidemia, unspecified: Secondary | ICD-10-CM | POA: Diagnosis present

## 2015-11-29 DIAGNOSIS — I739 Peripheral vascular disease, unspecified: Secondary | ICD-10-CM | POA: Diagnosis present

## 2015-11-29 DIAGNOSIS — E1122 Type 2 diabetes mellitus with diabetic chronic kidney disease: Secondary | ICD-10-CM | POA: Diagnosis present

## 2015-11-29 DIAGNOSIS — I482 Chronic atrial fibrillation: Secondary | ICD-10-CM | POA: Diagnosis present

## 2015-11-29 DIAGNOSIS — L97429 Non-pressure chronic ulcer of left heel and midfoot with unspecified severity: Secondary | ICD-10-CM | POA: Diagnosis present

## 2015-11-29 DIAGNOSIS — Z8249 Family history of ischemic heart disease and other diseases of the circulatory system: Secondary | ICD-10-CM

## 2015-11-29 DIAGNOSIS — N183 Chronic kidney disease, stage 3 (moderate): Secondary | ICD-10-CM | POA: Diagnosis present

## 2015-11-29 DIAGNOSIS — IMO0002 Reserved for concepts with insufficient information to code with codable children: Secondary | ICD-10-CM

## 2015-11-29 DIAGNOSIS — E876 Hypokalemia: Secondary | ICD-10-CM | POA: Diagnosis present

## 2015-11-29 DIAGNOSIS — I872 Venous insufficiency (chronic) (peripheral): Secondary | ICD-10-CM | POA: Diagnosis present

## 2015-11-29 DIAGNOSIS — I130 Hypertensive heart and chronic kidney disease with heart failure and stage 1 through stage 4 chronic kidney disease, or unspecified chronic kidney disease: Secondary | ICD-10-CM | POA: Diagnosis present

## 2015-11-29 DIAGNOSIS — K226 Gastro-esophageal laceration-hemorrhage syndrome: Secondary | ICD-10-CM | POA: Diagnosis present

## 2015-11-29 DIAGNOSIS — Z79899 Other long term (current) drug therapy: Secondary | ICD-10-CM

## 2015-11-29 DIAGNOSIS — K449 Diaphragmatic hernia without obstruction or gangrene: Secondary | ICD-10-CM | POA: Diagnosis present

## 2015-11-29 DIAGNOSIS — N179 Acute kidney failure, unspecified: Secondary | ICD-10-CM | POA: Insufficient documentation

## 2015-11-29 DIAGNOSIS — Z7984 Long term (current) use of oral hypoglycemic drugs: Secondary | ICD-10-CM

## 2015-11-29 DIAGNOSIS — Z89412 Acquired absence of left great toe: Secondary | ICD-10-CM | POA: Diagnosis not present

## 2015-11-29 DIAGNOSIS — E1152 Type 2 diabetes mellitus with diabetic peripheral angiopathy with gangrene: Secondary | ICD-10-CM | POA: Diagnosis present

## 2015-11-29 DIAGNOSIS — K920 Hematemesis: Secondary | ICD-10-CM | POA: Diagnosis present

## 2015-11-29 DIAGNOSIS — Z66 Do not resuscitate: Secondary | ICD-10-CM | POA: Diagnosis present

## 2015-11-29 DIAGNOSIS — K922 Gastrointestinal hemorrhage, unspecified: Secondary | ICD-10-CM | POA: Insufficient documentation

## 2015-11-29 DIAGNOSIS — E119 Type 2 diabetes mellitus without complications: Secondary | ICD-10-CM

## 2015-11-29 DIAGNOSIS — E11621 Type 2 diabetes mellitus with foot ulcer: Secondary | ICD-10-CM | POA: Diagnosis present

## 2015-11-29 DIAGNOSIS — I2510 Atherosclerotic heart disease of native coronary artery without angina pectoris: Secondary | ICD-10-CM | POA: Diagnosis present

## 2015-11-29 DIAGNOSIS — G20 Parkinson's disease: Secondary | ICD-10-CM | POA: Diagnosis present

## 2015-11-29 DIAGNOSIS — Z8673 Personal history of transient ischemic attack (TIA), and cerebral infarction without residual deficits: Secondary | ICD-10-CM | POA: Diagnosis not present

## 2015-11-29 DIAGNOSIS — M869 Osteomyelitis, unspecified: Secondary | ICD-10-CM | POA: Diagnosis present

## 2015-11-29 DIAGNOSIS — Z955 Presence of coronary angioplasty implant and graft: Secondary | ICD-10-CM

## 2015-11-29 DIAGNOSIS — R001 Bradycardia, unspecified: Secondary | ICD-10-CM | POA: Diagnosis present

## 2015-11-29 DIAGNOSIS — E039 Hypothyroidism, unspecified: Secondary | ICD-10-CM | POA: Diagnosis present

## 2015-11-29 DIAGNOSIS — E1140 Type 2 diabetes mellitus with diabetic neuropathy, unspecified: Secondary | ICD-10-CM | POA: Diagnosis present

## 2015-11-29 DIAGNOSIS — N400 Enlarged prostate without lower urinary tract symptoms: Secondary | ICD-10-CM | POA: Diagnosis present

## 2015-11-29 DIAGNOSIS — E118 Type 2 diabetes mellitus with unspecified complications: Secondary | ICD-10-CM | POA: Diagnosis present

## 2015-11-29 DIAGNOSIS — D62 Acute posthemorrhagic anemia: Secondary | ICD-10-CM | POA: Diagnosis present

## 2015-11-29 DIAGNOSIS — I4891 Unspecified atrial fibrillation: Secondary | ICD-10-CM | POA: Diagnosis not present

## 2015-11-29 DIAGNOSIS — M109 Gout, unspecified: Secondary | ICD-10-CM | POA: Diagnosis present

## 2015-11-29 DIAGNOSIS — Z7902 Long term (current) use of antithrombotics/antiplatelets: Secondary | ICD-10-CM | POA: Diagnosis not present

## 2015-11-29 DIAGNOSIS — I10 Essential (primary) hypertension: Secondary | ICD-10-CM | POA: Diagnosis present

## 2015-11-29 LAB — URINALYSIS COMPLETE WITH MICROSCOPIC (ARMC ONLY)
BACTERIA UA: NONE SEEN
BILIRUBIN URINE: NEGATIVE
GLUCOSE, UA: NEGATIVE mg/dL
HGB URINE DIPSTICK: NEGATIVE
Leukocytes, UA: NEGATIVE
NITRITE: NEGATIVE
Protein, ur: NEGATIVE mg/dL
Specific Gravity, Urine: 1.025 (ref 1.005–1.030)
pH: 5 (ref 5.0–8.0)

## 2015-11-29 LAB — COMPREHENSIVE METABOLIC PANEL
ALBUMIN: 2.3 g/dL — AB (ref 3.5–5.0)
ALBUMIN: 2.6 g/dL — AB (ref 3.5–5.0)
ALK PHOS: 151 U/L — AB (ref 38–126)
ALT: 8 U/L — AB (ref 17–63)
ALT: 9 U/L — AB (ref 17–63)
AST: 13 U/L — AB (ref 15–41)
AST: 14 U/L — AB (ref 15–41)
Alkaline Phosphatase: 147 U/L — ABNORMAL HIGH (ref 38–126)
Anion gap: 8 (ref 5–15)
Anion gap: 11 (ref 5–15)
BILIRUBIN TOTAL: 0.6 mg/dL (ref 0.3–1.2)
BUN: 17 mg/dL (ref 6–20)
BUN: 25 mg/dL — AB (ref 6–20)
CALCIUM: 8.7 mg/dL — AB (ref 8.9–10.3)
CHLORIDE: 103 mmol/L (ref 101–111)
CO2: 27 mmol/L (ref 22–32)
CO2: 24 mmol/L (ref 22–32)
CREATININE: 1.2 mg/dL (ref 0.61–1.24)
CREATININE: 1.27 mg/dL — AB (ref 0.61–1.24)
Calcium: 8.4 mg/dL — ABNORMAL LOW (ref 8.9–10.3)
Chloride: 102 mmol/L (ref 101–111)
GFR calc Af Amer: >60 mL/min (ref >60)
GFR calc Af Amer: 56 mL/min — ABNORMAL LOW (ref >60)
GFR calc non Af Amer: 49 mL/min — ABNORMAL LOW (ref >60)
GFR, EST NON AFRICAN AMERICAN: 52 mL/min — AB (ref >60)
GLUCOSE: 158 mg/dL — AB (ref 65–99)
GLUCOSE: 175 mg/dL — AB (ref 65–99)
Potassium: 3.9 mmol/L (ref 3.5–5.1)
Potassium: 4.3 mmol/L (ref 3.5–5.1)
Sodium: 138 mmol/L (ref 135–145)
Sodium: 137 mmol/L (ref 135–145)
Total Bilirubin: 0.9 mg/dL (ref 0.3–1.2)
Total Protein: 5.2 g/dL — ABNORMAL LOW (ref 6.5–8.1)
Total Protein: 5.6 g/dL — ABNORMAL LOW (ref 6.5–8.1)

## 2015-11-29 LAB — CBC WITH DIFFERENTIAL/PLATELET
BASOS ABS: 0.1 10*3/uL (ref 0–0.1)
Basophils Absolute: 0.1 10*3/uL (ref 0–0.1)
Basophils Relative: 1 %
Basophils Relative: 1 %
EOS ABS: 0.2 10*3/uL (ref 0–0.7)
EOS PCT: 2 %
Eosinophils Absolute: 0.2 10*3/uL (ref 0–0.7)
Eosinophils Relative: 2 %
HCT: 25.4 % — ABNORMAL LOW (ref 40.0–52.0)
HEMATOCRIT: 27.2 % — AB (ref 40.0–52.0)
HEMOGLOBIN: 9.1 g/dL — AB (ref 13.0–18.0)
Hemoglobin: 8.8 g/dL — ABNORMAL LOW (ref 13.0–18.0)
LYMPHS ABS: 1.7 10*3/uL (ref 1.0–3.6)
LYMPHS PCT: 13 %
Lymphs Abs: 1.2 10*3/uL (ref 1.0–3.6)
MCH: 30.7 pg (ref 26.0–34.0)
MCH: 30.5 pg (ref 26.0–34.0)
MCHC: 34.5 g/dL (ref 32.0–36.0)
MCHC: 33.7 g/dL (ref 32.0–36.0)
MCV: 88.8 fL (ref 80.0–100.0)
MCV: 90.6 fL (ref 80.0–100.0)
Monocytes Absolute: 0.7 10*3/uL (ref 0.2–1.0)
Monocytes Absolute: 1 10*3/uL (ref 0.2–1.0)
Monocytes Relative: 8 %
Monocytes Relative: 9 %
NEUTROS ABS: 6.7 10*3/uL — AB (ref 1.4–6.5)
NEUTROS ABS: 8.2 10*3/uL — AB (ref 1.4–6.5)
Neutrophils Relative %: 76 %
PLATELETS: 222 10*3/uL (ref 150–440)
Platelets: 266 10*3/uL (ref 150–440)
RBC: 2.86 MIL/uL — AB (ref 4.40–5.90)
RBC: 3 MIL/uL — AB (ref 4.40–5.90)
RDW: 15.9 % — ABNORMAL HIGH (ref 11.5–14.5)
RDW: 16.2 % — ABNORMAL HIGH (ref 11.5–14.5)
WBC: 8.8 10*3/uL (ref 3.8–10.6)
WBC: 11.1 10*3/uL — AB (ref 3.8–10.6)

## 2015-11-29 LAB — TROPONIN I: Troponin I: <0.03 ng/mL (ref <0.03)

## 2015-11-29 LAB — GLUCOSE, CAPILLARY
GLUCOSE-CAPILLARY: 145 mg/dL — AB (ref 65–99)
GLUCOSE-CAPILLARY: 126 mg/dL — AB (ref 65–99)
Glucose-Capillary: 160 mg/dL — ABNORMAL HIGH (ref 65–99)

## 2015-11-29 LAB — PROTIME-INR
INR: 1.23
PROTHROMBIN TIME: 15.7 s — AB (ref 11.4–15.0)

## 2015-11-29 MED ORDER — SODIUM CHLORIDE 0.9 % IV SOLN
8.0000 mg/h | INTRAVENOUS | Status: DC
  Filled 2015-11-29: qty 80

## 2015-11-29 MED ORDER — SODIUM CHLORIDE 0.9 % IV BOLUS (SEPSIS)
500.0000 mL | Freq: Once | INTRAVENOUS | Status: AC
  Administered 2015-11-29: 500 mL via INTRAVENOUS

## 2015-11-29 MED ORDER — SODIUM CHLORIDE 0.9 % IV BOLUS (SEPSIS)
1000.0000 mL | Freq: Once | INTRAVENOUS | Status: AC
Start: 2015-11-29 — End: 2015-11-29
  Administered 2015-11-29: 1000 mL via INTRAVENOUS

## 2015-11-29 MED ORDER — SODIUM CHLORIDE 0.9 % IV SOLN
80.0000 mg | Freq: Once | INTRAVENOUS | Status: AC
  Administered 2015-11-29: 80 mg via INTRAVENOUS
  Filled 2015-11-29: qty 80

## 2015-11-29 MED ORDER — ONDANSETRON HCL 4 MG/2ML IJ SOLN
4.0000 mg | Freq: Once | INTRAMUSCULAR | Status: AC
  Administered 2015-11-29: 4 mg via INTRAVENOUS

## 2015-11-29 MED ORDER — PANTOPRAZOLE SODIUM 40 MG IV SOLR: 40.0000 mg | Freq: Two times a day (BID) | INTRAVENOUS | Status: DC

## 2015-11-29 MED ORDER — ONDANSETRON HCL 4 MG/2ML IJ SOLN
INTRAMUSCULAR | Status: AC
  Administered 2015-11-29: 4 mg via INTRAVENOUS
  Filled 2015-11-29: qty 2

## 2015-11-29 NOTE — ED Notes (Signed)
Pt from [facility], per staff pt had 3 episodes of hematemesis, reports 100cc/500cc of "coaggulated blood" pt recently D/C from hospital for partial foot amputation and is currently on ABX. Pt presents with PICC line intact

## 2015-11-29 NOTE — ED Notes (Signed)
Per family. Pt is on blood thinners

## 2015-11-29 NOTE — ED Provider Notes (Signed)
[facility] Emergency Department Provider Note   ____________________________________________  Time seen: Approximately 7pm I have reviewed the triage vital signs and the triage nursing note.  HISTORY  Chief Complaint Hematemesis   Historian Patient and son  HPI [name] is a 82 y.o. male currently staying at a nursing home after left great toe amputation, with osteomyelitis and a PICC line currently, here for hematemesis. Reportedly started this morning, numerous episodes throughout the day.Reportedly coagulated blood, small amounts but frequently. Denies abdominal pain or nausea. He is on Plavix and aspirin. No black or bloody stools reported. Prior episodes of gastrointestinal bleeding.    Past Medical History  Diagnosis Date  . Coronary artery disease   . Diabetes mellitus without complication (HCC)   . Aortic stenosis   . Hypertension   . Hyperlipidemia   . TIA (transient ischemic attack)   . Thyroid disease   . Hypothyroidism     Patient Active Problem List   Diagnosis Date Noted  . Heel ulcer (HCC) 11/21/2015  . Diabetic foot (HCC) 11/04/2015  . PAD (peripheral artery disease) (HCC) 10/28/2015  . Diabetes mellitus without complication (HCC) 07/11/2015  . Clavicle enlargement 07/11/2015  . Poorly controlled type 2 diabetes mellitus (HCC) 06/23/2014  . Bed sore on heel, right, unstageable (HCC) 09/23/2013  . Coronary atherosclerosis of native coronary artery 03/27/2013  . Chronic diastolic CHF (congestive heart failure) (HCC) 03/27/2013  . Aortic valve stenosis 03/27/2013  . Essential hypertension 03/27/2013  . Hyperlipidemia 03/27/2013  . Atrial fibrillation (HCC) 03/27/2013  . Bilateral leg edema 03/27/2013  . Constipation 03/27/2013    Past Surgical History  Procedure Laterality Date  . Appendectomy    . Hernia repair    . Knee surgery      bilateral  . Eye surgery    . Colonoscopy    . Cardiac catheterization  2010     showed a patient circumflex stent with noncritical CAD and normal right-sided pressures.  . Coronary angioplasty  01/24/2007    stent placement to the mid circumflex   . Knee surgery Left   . Peripheral vascular catheterization N/A 08/30/2015    Procedure: Abdominal Aortogram w/Lower Extremity;  Surgeon: Renford Dills, MD;  Location: [facility] INVASIVE CV LAB;  Service: Cardiovascular;  Laterality: N/A;  . Peripheral vascular catheterization  08/30/2015    Procedure: Lower Extremity Intervention;  Surgeon: Renford Dills, MD;  Location: [facility] INVASIVE CV LAB;  Service: Cardiovascular;;  . Amputation toe Left 11/05/2015    Procedure: AMPUTATION TOE;  Surgeon: Recardo Evangelist, DPM;  Location: [facility] ORS;  Service: Podiatry;  Laterality: Left;  . Irrigation and debridement foot Left 11/05/2015    Procedure: IRRIGATION AND DEBRIDEMENT FOOT / HEEL ULCER;  Surgeon: Recardo Evangelist, DPM;  Location: [facility] ORS;  Service: Podiatry;  Laterality: Left;  . Application of wound vac Left 11/05/2015    Procedure: APPLICATION OF WOUND VAC;  Surgeon: Recardo Evangelist, DPM;  Location: [facility] ORS;  Service: Podiatry;  Laterality: Left;  . Peripheral vascular catheterization Left 11/23/2015    Procedure: Abdominal Aortogram w/Lower Extremity;  Surgeon: Renford Dills, MD;  Location: [facility] INVASIVE CV LAB;  Service: Cardiovascular;  Laterality: Left;  . Peripheral vascular catheterization  11/23/2015    Procedure: Lower Extremity Intervention;  Surgeon: Renford Dills, MD;  Location: [facility] CV [facility];  Service: Cardiovascular;;  . Irrigation and debridement foot Left 11/24/2015    Procedure: IRRIGATION AND DEBRIDEMENT FOOT;  Surgeon: Recardo Evangelist, DPM;  Location:  [facility] ORS;  Service: Podiatry;  Laterality: Left;  . Peripheral vascular catheterization Right 11/25/2015    Procedure: Lower Extremity Angiography;  Surgeon: Renford DillsGregory G Schnier, MD;  Location: [facility] INV[facility] CV LAB;  Service: Cardiovascular;  Laterality: Right;     Current Outpatient Rx  Name  Route  Sig  Dispense  Refill  . amiodarone (PACERONE) 200 MG tablet   Oral   Take 100 mg by mouth daily.         Marland Kitchen. aspirin EC 81 MG tablet   Oral   Take 1 tablet (81 mg total) by mouth daily.   150 tablet   2   . carbidopa-levodopa (SINEMET IR) 25-100 MG per tablet   Oral   Take 1 tablet by mouth 4 (four) times daily.         . clopidogrel (PLAVIX) 75 MG tablet   Oral   Take 75 mg by mouth daily.         Marland Kitchen. EXPIRED: colchicine 0.6 MG tablet   Oral   Take 0.6 mg by mouth daily. For 10 days.         . cyanocobalamin (,VITAMIN B-12,) 1000 MCG/ML injection   Intramuscular   Inject 1 mL (1,000 mcg total) into the muscle every 30 (thirty) days. 1 injection in 2 weeks then every 30 days   1 mL   12   . feeding supplement, GLUCERNA SHAKE, (GLUCERNA SHAKE) LIQD   Oral   Take 237 mLs by mouth 3 (three) times daily between meals.      0   . furosemide (LASIX) 40 MG tablet   Oral   Take 1 tablet (40 mg total) by mouth 2 (two) times daily.   60 tablet   6   . levothyroxine (SYNTHROID, LEVOTHROID) 150 MCG tablet   Oral   Take 150 mcg by mouth daily before breakfast.         . oxyCODONE-acetaminophen (PERCOCET/ROXICET) 5-325 MG tablet   Oral   Take 1 tablet by mouth every 4 (four) hours as needed for moderate pain.   30 tablet   0   . piperacillin-tazobactam (ZOSYN) 3.375 GM/50ML IVPB   Intravenous   Inject 50 mLs (3.375 g total) into the vein every 8 (eight) hours.   50 mL   35   . potassium chloride (K-DUR,KLOR-CON) 10 MEQ tablet   Oral   Take 10 mEq by mouth daily.          . pramipexole (MIRAPEX) 1 MG tablet   Oral   Take 0.5 mg by mouth 4 (four) times daily.          . sitaGLIPtin (JANUVIA) 100 MG tablet   Oral   Take 1 tablet (100 mg total) by mouth daily.   90 tablet   1   . tamsulosin (FLOMAX) 0.4 MG CAPS capsule   Oral   Take 1 capsule (0.4 mg total) by mouth daily after supper.   30 capsule   6    . vancomycin (VANCOCIN) 1-5 GM/200ML-% SOLN   Intravenous   Inject 200 mLs (1,000 mg total) into the vein daily.   4000 mL   12     Allergies Review of patient's allergies indicates no known allergies.  Family History  Problem Relation Age of Onset  . Heart disease Mother   . Heart attack Father 1962    MI  . Heart attack Brother [id number]    MI    Social History Social History  Substance  Use Topics  . Smoking status: Never Smoker   . Smokeless tobacco: Never Used  . Alcohol Use: No    Review of Systems  Constitutional: Negative for fever. Eyes: Negative for visual changes. ENT: Negative for sore throat. Cardiovascular: Negative for chest pain. Respiratory: Negative for shortness of breath. Gastrointestinal: Negative for abdominal pain or diarrhea. Genitourinary: Negative for dysuria. Musculoskeletal: Negative for back pain. Skin: Negative for rash. Neurological: Negative for headache. 10 point Review of Systems otherwise negative ____________________________________________   PHYSICAL EXAM:  VITAL SIGNS: ED Triage Vitals  Enc Vitals Group     BP --      Pulse --      Resp --      Temp --      Temp src --      SpO2 --      Weight --      Height --      Head Cir --      Peak Flow --      Pain Score --      Pain Loc --      Pain Edu? --      Excl. in GC? --      Constitutional: Alert and Cooperative. Well appearing and in no distress. HEENT   Head: Normocephalic and atraumatic.      Eyes: Conjunctivae are normal. PERRL. Normal extraocular movements.      Ears:         Nose: No congestion/rhinnorhea.   Mouth/Throat: Mucous membranes are mild dry.   Neck: No stridor. Cardiovascular/Chest: Normal rate, regular rhythm.  No murmurs, rubs, or gallops. Respiratory: Normal respiratory effort without tachypnea nor retractions. Breath sounds are clear and equal bilaterally. No wheezes/rales/rhonchi. Gastrointestinal: Soft. No distention, no guarding,  no rebound. Nontender.    Genitourinary/rectal:Deferred Musculoskeletal: Nontender with normal range of motion in all extremities. No joint effusions.  No lower extremity tenderness.  No edema. Neurologic:  Normal speech and language. No gross or focal neurologic deficits are appreciated. Skin:  Skin is warm, dry and intact. No rash noted. A little pale Psychiatric: Mood and affect are normal. Speech and behavior are normal. Patient exhibits appropriate insight and judgment.  ____________________________________________   EKG I, Governor Rooksebecca Kern Gingras, MD, the attending physician have personally viewed and interpreted all ECGs.  90 bpm. Normal sinus rhythm with first-degree A-V block and PACs. Narrow QRS. Normal axis. Nonspecific ST and T-wave ____________________________________________  LABS (pertinent positives/negatives)  Labs Reviewed  COMPREHENSIVE METABOLIC PANEL - Abnormal; Notable for the following:    Glucose, Bld 175 (*)    BUN 25 (*)    Creatinine, Ser 1.27 (*)    Calcium 8.7 (*)    Total Protein 5.6 (*)    Albumin 2.6 (*)    AST 14 (*)    ALT 9 (*)    Alkaline Phosphatase 151 (*)    GFR calc non Af Amer 49 (*)    GFR calc Af Amer 56 (*)    All other components within normal limits  CBC WITH DIFFERENTIAL/PLATELET - Abnormal; Notable for the following:    WBC 11.1 (*)    RBC 3.00 (*)    Hemoglobin 9.1 (*)    HCT 27.2 (*)    RDW 16.2 (*)    Neutro Abs 8.2 (*)    All other components within normal limits  PROTIME-INR - Abnormal; Notable for the following:    Prothrombin Time 15.7 (*)    All other components within normal limits  TROPONIN I  TYPE AND SCREEN    ____________________________________________  RADIOLOGY All Xrays were viewed by me. Imaging interpreted by Radiologist.  None __________________________________________  PROCEDURES  Procedure(s) performed: None  Critical Care performed: None  ____________________________________________   ED  COURSE / ASSESSMENT AND PLAN  Pertinent labs & imaging results that were available during my care of the patient were reviewed by me and considered in my medical decision making (see chart for details).   The patient's here with report of hematemesis several times. He has stable vital signs on arrival. He does look a little dehydrated clinically and somewhat pale. I discussed with him risk and benefit of blood transfusion should it be needed and he agreed to this.  He is currently stable and I wait for blood tests, which upon return show mild acute renal failure. He was given IV fluid bolus here. His hemoglobin is 9.1 which is a little up in stable from previous.  His troponin is negative and his EKG is nonspecific.  Given the history of hematemesis and the fact that he is on blood thinners, I did place him on Protonix bolus and drip. He will need hospital observation for stability. Because we do not have GI in the hospital right now, patient will be transferred in case he becomes unstable GI would be available for backup.  CONSULTATIONS:   Dr. Cindy Hazy [facility] hospitalist for transfer admission.   Patient / Family / Caregiver informed of clinical course, medical decision-making process, and agree with plan.    ___________________________________________   FINAL CLINICAL IMPRESSION(S) / ED DIAGNOSES   Final diagnoses:  Upper GI bleed  Acute renal failure, unspecified acute renal failure type [facility])              Note: This dictation was prepared with Dragon dictation. Any transcriptional errors that result from this process are unintentional   Governor Rooks, MD 11/29/15 2029

## 2015-11-29 NOTE — H&P (Signed)
[facility] History and Physical  [name] LKG:[id number] DOB: 1925-03-05 DOA: 11/29/2015  Referring physician: Dr. Reita Cliche, [facility] ED PCP: Sela Hua, PA-C   Chief Complaint: Hematemesis  HPI: [name] is a 82 y.o. male with history of NIDDM, Parkinson's and PVD.  He was admitted early Jun with osteo of L great toe and underwent R amp , also had I&D of heel decub.  DC"d do SNF, then returned w worsening heel ulcer last week and had I&D of heel ulcer at [facility] and also underwent angio to both LE's with angioplasty bilat and some aspiration thrombectomy from the RLE.  He was dc'd yesterday back to SNF. Today the patient started to vomit blood after eating breakfast, threw up some dark red clots and some BRB.  Had another episode hematemesis in the afternoon x 2.  Taken to [facility] ED where Hb was 9 and BP's stable.  We were asked to take pt as they didn't have GI coverage.  He was transferred here, on arrival he threw up about 1/2 cup of dark red blood with clots.  No chest pain, SOB. Is weak and tired, no abd pain.  He occasionally takes Alleve but hasn't lately.  He is on asa and plavix, just underwent PTA to both LE's about a week ago.  REading the notes I dont' see where any stents were placed.    Denies history of GIB.  No abd pain , melena or diarrhea.  No hx liver disease, etoh abuse or ulcers.    Had cardiac stent in LCX in 2002 for single vessel cAD.  Has parkinson's and is DNR per family who are at bedside.    Chart review: Aug '02 had chest pain and stent /PCI to LCX for single vessel disease Jun 2-7, 2017 - had osteo of L first metatarsal head, heel ulcer and underwent first ray amp w AB bead placement and I&D of L heel wound w VAC placement. Wound cx's grew MRSA, enterococcus and proteus.  JUn 19- 26, 2017 - readmitted for left heel ulcer worsening, underwent angiogram LLE with angioplasty on 6/22. Then Podiatry did I&D of heel ulcer, wound VAC, IV abx per ID.  Also  then had angioplasty of R post tib artery, and aspiration thrombectomy of R popliteal/ TP arteries and R post tib artery.  Afib on amio/ ASA. Parkinsons on Sinemet.   ROS  denies CP  no joint pain   no HA  no blurry vision  no rash  no diarrhea  no nausea/ vomiting  no dysuria  no difficulty voiding  no change in urine color    Past Medical History  Past Medical History  Diagnosis Date  . Coronary artery disease   . Diabetes mellitus without complication ([place])   . Aortic stenosis   . Hypertension   . Hyperlipidemia   . TIA (transient ischemic attack)   . Thyroid disease   . Hypothyroidism    Past Surgical History  Past Surgical History  Procedure Laterality Date  . Appendectomy    . Hernia repair    . Knee surgery      bilateral  . Eye surgery    . Colonoscopy    . Cardiac catheterization  2010    showed a patient circumflex stent with noncritical CAD and normal right-sided pressures.  . Coronary angioplasty  01/24/2007    stent placement to the mid circumflex   . Knee surgery Left   . Peripheral vascular catheterization N/A  08/30/2015    Procedure: Abdominal Aortogram w/Lower Extremity;  Surgeon: Katha Cabal, MD;  Location: [place] CV LAB;  Service: Cardiovascular;  Laterality: N/A;  . Peripheral vascular catheterization  08/30/2015    Procedure: Lower Extremity Intervention;  Surgeon: Katha Cabal, MD;  Location: [place] CV LAB;  Service: Cardiovascular;;  . Amputation toe Left 11/05/2015    Procedure: AMPUTATION TOE;  Surgeon: Albertine Patricia, DPM;  Location: [facility] ORS;  Service: Podiatry;  Laterality: Left;  . Irrigation and debridement foot Left 11/05/2015    Procedure: IRRIGATION AND DEBRIDEMENT FOOT / HEEL ULCER;  Surgeon: Albertine Patricia, DPM;  Location: [facility] ORS;  Service: Podiatry;  Laterality: Left;  . Application of wound vac Left 11/05/2015    Procedure: APPLICATION OF WOUND VAC;  Surgeon: Albertine Patricia, DPM;  Location: [facility] ORS;  Service:  Podiatry;  Laterality: Left;  . Peripheral vascular catheterization Left 11/23/2015    Procedure: Abdominal Aortogram w/Lower Extremity;  Surgeon: Katha Cabal, MD;  Location: [place] CV LAB;  Service: Cardiovascular;  Laterality: Left;  . Peripheral vascular catheterization  11/23/2015    Procedure: Lower Extremity Intervention;  Surgeon: Katha Cabal, MD;  Location: [place] CV LAB;  Service: Cardiovascular;;  . Irrigation and debridement foot Left 11/24/2015    Procedure: IRRIGATION AND DEBRIDEMENT FOOT;  Surgeon: Albertine Patricia, DPM;  Location: [facility] ORS;  Service: Podiatry;  Laterality: Left;  . Peripheral vascular catheterization Right 11/25/2015    Procedure: Lower Extremity Angiography;  Surgeon: Katha Cabal, MD;  Location: [place] CV LAB;  Service: Cardiovascular;  Laterality: Right;   Family History  Family History  Problem Relation Age of Onset  . Heart disease Mother   . Heart attack Father 38    MI  . Heart attack Brother 23    MI   Social History  reports that he has never smoked. He has never used smokeless tobacco. He reports that he does not drink alcohol or use illicit drugs. Allergies No Known Allergies Home medications Prior to Admission medications   Medication Sig Start Date End Date Taking? Authorizing Provider  Amino Acids-Protein Hydrolys (FEEDING SUPPLEMENT, PRO-STAT SUGAR FREE 64,) LIQD Take 30 mLs by mouth 2 (two) times daily between meals.    Historical Provider, MD  amiodarone (PACERONE) 100 MG tablet Take 100 mg by mouth daily.    Historical Provider, MD  carbidopa-levodopa (SINEMET IR) 25-100 MG per tablet Take 1 tablet by mouth 4 (four) times daily.    Historical Provider, MD  colchicine 0.6 MG tablet Take 0.6 mg by mouth daily.  11/29/15 12/08/15  Historical Provider, MD  Famotidine (PEPCID IV) Inject 20 mg into the vein every 12 (twelve) hours. 11/29/15 12/02/15  Historical Provider, MD  feeding supplement, GLUCERNA SHAKE,  (GLUCERNA SHAKE) LIQD Take 237 mLs by mouth 3 (three) times daily between meals. 11/09/15   Dustin Flock, MD  furosemide (LASIX) 40 MG tablet Take 1 tablet (40 mg total) by mouth 2 (two) times daily. 07/29/15   Minna Merritts, MD  heparin flush 10 UNIT/ML SOLN injection Inject 10 Units into the vein 4 (four) times daily.    Historical Provider, MD  levothyroxine (SYNTHROID, LEVOTHROID) 150 MCG tablet Take 150 mcg by mouth daily before breakfast.    Historical Provider, MD  misoprostol (CYTOTEC) 100 MCG tablet Take 100 mcg by mouth 4 (four) times daily.    Historical Provider, MD  oxyCODONE-acetaminophen (PERCOCET/ROXICET) 5-325 MG tablet Take 1 tablet by mouth every 4 (four)  hours as needed for moderate pain. 11/09/15   Dustin Flock, MD  piperacillin-tazobactam (ZOSYN) 3.375 GM/50ML IVPB Inject 50 mLs (3.375 g total) into the vein every 8 (eight) hours. 11/28/15 12/09/15  Vaughan Basta, MD  potassium chloride (K-DUR,KLOR-CON) 10 MEQ tablet Take 10 mEq by mouth daily.     Historical Provider, MD  pramipexole (MIRAPEX) 1 MG tablet Take 1 mg by mouth 4 (four) times daily.     Historical Provider, MD  sitaGLIPtin (JANUVIA) 100 MG tablet Take 1 tablet (100 mg total) by mouth daily. 07/11/15   Guadalupe Maple, MD  sodium chloride flush 0.9 % SOLN injection Inject 10 mLs into the vein 4 (four) times daily.    Historical Provider, MD  tamsulosin (FLOMAX) 0.4 MG CAPS capsule Take 1 capsule (0.4 mg total) by mouth daily after supper. 10/17/15   Guadalupe Maple, MD  vancomycin (VANCOCIN) 1-5 GM/200ML-% SOLN Inject 200 mLs (1,000 mg total) into the vein daily. 11/28/15 12/09/15  Vaughan Basta, MD   Liver Function Tests  Recent Labs Lab 11/29/15 1000 11/29/15 1847  AST 13* 14*  ALT 8* 9*  ALKPHOS 147* 151*  BILITOT 0.9 0.6  PROT 5.2* 5.6*  ALBUMIN 2.3* 2.6*   No results for input(s): LIPASE, AMYLASE in the last 168 hours. CBC  Recent Labs Lab 11/25/15 0500 11/29/15 1000 11/29/15 1847   WBC 7.2 8.8 11.1*  NEUTROABS  --  6.7* 8.2*  HGB 10.1* 8.8* 9.1*  HCT 30.0* 25.4* 27.2*  MCV 89.5 88.8 90.6  PLT 248 222 701   Basic Metabolic Panel  Recent Labs Lab 11/23/15 0541 11/25/15 0500 11/26/15 0600 11/28/15 0434 11/29/15 1000 11/29/15 1847  NA 139 138 141  --  138 137  K 3.8 3.4* 3.7  --  3.9 4.3  CL 103 108 111  --  103 102  CO2 28 26 25   --  27 24  GLUCOSE 157* 118* 142*  --  158* 175*  BUN 20 17 14   --  17 25*  CREATININE 1.18 1.03 1.10 1.10 1.20 1.27*  CALCIUM 8.7* 8.2* 8.4*  --  8.4* 8.7*     There were no vitals filed for this visit. Exam: Gen elderly WM, pale, tired, not in distress No rash, cyanosis or gangrene Sclera anicteric, throat clear and dry  No jvd or bruits Chest clear bilat RRR no MRG Abd soft ntnd no mass or ascites +bs mod obese GU normal male MS L great toe missing w clean wounds, L heel ulcer w wound vac partially dislodged Ext no LE edema Neuro is alert, Ox 3 , nf, gen'd weakness  Na 137 K 4.3 Creat 1.27  BUN 25  eGFR 49  Alb 2.6  AST/ALT/ bili wnl Glu 175  WBC 11k  Hb 8.8 at 10 am, 9.1 at 6:50 PM INR 1.23 UA negative  EKG (independ reviewed) > NSR w PAC's w aberrant conduction, no acute changes   Assessment: 1  Upper GIB/ hematemesis - d/w GI, plan IV PPI drip and NPO, will plan endo tomorrow, sooner if needed. Frequent VS.  2  Anemia - Hb stable today at [facility] , 8.8 at 10 am and 9.0 at 6 pm.  Hb here pending.   3  Recent L great toe amp (for osteo) - on IV abx w vanc 1gm daily / zosyn 3.375 tid thru July 7 via PICC 4  L heel ulcer, smaller R heel ulcer- will dc VAC and do w/d dressing, WC consult 5  Parkinsons 6  PVD - s/p recent bilat LE perc revasc procedures at [facility] 7  DM on oral meds at home, use SSI for now 8  DNR - pt request 9  PICC line  Plan - as above     Sol Blazing [facility] Pager [phone]  Cell [phone]  If 7PM-7AM, please contact night-coverage [email or web address] Password  [facility] 11/29/2015, 11:52 PM

## 2015-11-30 ENCOUNTER — Inpatient Hospital Stay (HOSPITAL_COMMUNITY): Payer: PPO | Admitting: Certified Registered Nurse Anesthetist

## 2015-11-30 ENCOUNTER — Encounter (HOSPITAL_COMMUNITY)
Admission: AD | Disposition: A | Discharge status: Skilled Nursing Facility | Payer: Self-pay | Source: Other Acute Inpatient Hospital | Attending: Internal Medicine

## 2015-11-30 ENCOUNTER — Encounter (HOSPITAL_COMMUNITY): Payer: Self-pay

## 2015-11-30 ENCOUNTER — Inpatient Hospital Stay (HOSPITAL_COMMUNITY): Payer: PPO

## 2015-11-30 DIAGNOSIS — K920 Hematemesis: Secondary | ICD-10-CM | POA: Diagnosis present

## 2015-11-30 DIAGNOSIS — Z66 Do not resuscitate: Secondary | ICD-10-CM

## 2015-11-30 DIAGNOSIS — K922 Gastrointestinal hemorrhage, unspecified: Secondary | ICD-10-CM | POA: Diagnosis present

## 2015-11-30 DIAGNOSIS — E119 Type 2 diabetes mellitus without complications: Secondary | ICD-10-CM

## 2015-11-30 DIAGNOSIS — IMO0002 Reserved for concepts with insufficient information to code with codable children: Secondary | ICD-10-CM

## 2015-11-30 DIAGNOSIS — L97429 Non-pressure chronic ulcer of left heel and midfoot with unspecified severity: Secondary | ICD-10-CM | POA: Diagnosis present

## 2015-11-30 DIAGNOSIS — Z89412 Acquired absence of left great toe: Secondary | ICD-10-CM

## 2015-11-30 DIAGNOSIS — D62 Acute posthemorrhagic anemia: Secondary | ICD-10-CM

## 2015-11-30 DIAGNOSIS — I739 Peripheral vascular disease, unspecified: Secondary | ICD-10-CM

## 2015-11-30 HISTORY — PX: ESOPHAGOGASTRODUODENOSCOPY: SHX5428

## 2015-11-30 LAB — CBC
HEMATOCRIT: 26.3 % — AB (ref 39.0–52.0)
HEMOGLOBIN: 8.4 g/dL — AB (ref 13.0–17.0)
MCH: 29.1 pg (ref 26.0–34.0)
MCHC: 31.9 g/dL (ref 30.0–36.0)
MCV: 91 fL (ref 78.0–100.0)
Platelets: 308 10*3/uL (ref 150–400)
RBC: 2.89 MIL/uL — AB (ref 4.22–5.81)
RDW: 15.9 % — ABNORMAL HIGH (ref 11.5–15.5)
WBC: 13.5 10*3/uL — ABNORMAL HIGH (ref 4.0–10.5)

## 2015-11-30 LAB — VANCOMYCIN, TROUGH: Vancomycin Tr: 12 ug/mL — ABNORMAL LOW (ref 15–20)

## 2015-11-30 LAB — HEMOGLOBIN AND HEMATOCRIT, BLOOD
HCT: 21.9 % — ABNORMAL LOW (ref 39.0–52.0)
HEMATOCRIT: 21.9 % — AB (ref 39.0–52.0)
HEMOGLOBIN: 7.2 g/dL — AB (ref 13.0–17.0)
Hemoglobin: 7.1 g/dL — ABNORMAL LOW (ref 13.0–17.0)

## 2015-11-30 LAB — IRON AND TIBC
IRON: 49 ug/dL (ref 45–182)
Saturation Ratios: 21 % (ref 17.9–39.5)
TIBC: 230 ug/dL — ABNORMAL LOW (ref 250–450)
UIBC: 181 ug/dL

## 2015-11-30 LAB — GLUCOSE, CAPILLARY
GLUCOSE-CAPILLARY: 159 mg/dL — AB (ref 65–99)
GLUCOSE-CAPILLARY: 145 mg/dL — AB (ref 65–99)
GLUCOSE-CAPILLARY: 129 mg/dL — AB (ref 65–99)
Glucose-Capillary: 154 mg/dL — ABNORMAL HIGH (ref 65–99)
Glucose-Capillary: 128 mg/dL — ABNORMAL HIGH (ref 65–99)
Glucose-Capillary: 133 mg/dL — ABNORMAL HIGH (ref 65–99)

## 2015-11-30 LAB — PREPARE RBC (CROSSMATCH)

## 2015-11-30 LAB — ABO/RH: ABO/RH(D): A POS

## 2015-11-30 SURGERY — EGD (ESOPHAGOGASTRODUODENOSCOPY)
Anesthesia: Monitor Anesthesia Care

## 2015-11-30 MED ORDER — VANCOMYCIN HCL 10 G IV SOLR
1250.0000 mg | INTRAVENOUS | Status: DC
  Administered 2015-11-30 – 2015-12-03 (×4): 1250 mg via INTRAVENOUS
  Filled 2015-11-30 (×4): qty 1250

## 2015-11-30 MED ORDER — MUPIROCIN 2 % EX OINT
1.0000 "application " | TOPICAL_OINTMENT | Freq: Two times a day (BID) | CUTANEOUS | Status: DC
  Administered 2015-12-01 – 2015-12-04 (×7): 1 via NASAL
  Filled 2015-11-30 (×2): qty 22

## 2015-11-30 MED ORDER — PIPERACILLIN-TAZOBACTAM 3.375 G IVPB
3.3750 g | Freq: Three times a day (TID) | INTRAVENOUS | Status: DC
  Administered 2015-11-30 – 2015-12-04 (×14): 3.375 g via INTRAVENOUS
  Filled 2015-11-30 (×14): qty 50

## 2015-11-30 MED ORDER — EPHEDRINE SULFATE 50 MG/ML IJ SOLN
INTRAMUSCULAR | Status: DC | PRN
  Administered 2015-11-30: 15 mg via INTRAVENOUS
  Administered 2015-11-30: 10 mg via INTRAVENOUS

## 2015-11-30 MED ORDER — PROPOFOL 10 MG/ML IV BOLUS
INTRAVENOUS | Status: AC
  Filled 2015-11-30: qty 40

## 2015-11-30 MED ORDER — TAMSULOSIN HCL 0.4 MG PO CAPS
0.4000 mg | ORAL_CAPSULE | Freq: Every day | ORAL | Status: DC
  Administered 2015-11-30 – 2015-12-03 (×4): 0.4 mg via ORAL
  Filled 2015-11-30 (×4): qty 1

## 2015-11-30 MED ORDER — SODIUM CHLORIDE 0.9 % IV SOLN
Freq: Once | INTRAVENOUS | Status: AC
  Administered 2015-11-30: 11:00:00 via INTRAVENOUS

## 2015-11-30 MED ORDER — SODIUM CHLORIDE 0.9 % IV SOLN
INTRAVENOUS | Status: DC
  Administered 2015-11-30 – 2015-12-01 (×5): via INTRAVENOUS

## 2015-11-30 MED ORDER — SODIUM CHLORIDE 0.9% FLUSH
10.0000 mL | Freq: Two times a day (BID) | INTRAVENOUS | Status: DC
  Administered 2015-11-30 – 2015-12-03 (×5): 10 mL

## 2015-11-30 MED ORDER — AMIODARONE HCL 100 MG PO TABS
100.0000 mg | ORAL_TABLET | Freq: Every day | ORAL | Status: DC
  Administered 2015-11-30 – 2015-12-04 (×5): 100 mg via ORAL
  Filled 2015-11-30 (×5): qty 1

## 2015-11-30 MED ORDER — SODIUM CHLORIDE 0.9 % IV SOLN
8.0000 mg/h | INTRAVENOUS | Status: DC
  Administered 2015-11-30 – 2015-12-02 (×6): 8 mg/h via INTRAVENOUS
  Filled 2015-11-30 (×13): qty 80

## 2015-11-30 MED ORDER — SODIUM CHLORIDE 0.9% FLUSH
10.0000 mL | INTRAVENOUS | Status: DC | PRN
  Administered 2015-11-30: 20 mL
  Administered 2015-12-01 – 2015-12-04 (×6): 10 mL
  Filled 2015-11-30 (×7): qty 40

## 2015-11-30 MED ORDER — CARBIDOPA-LEVODOPA 25-100 MG PO TABS
1.0000 | ORAL_TABLET | Freq: Three times a day (TID) | ORAL | Status: DC
  Administered 2015-11-30 – 2015-12-04 (×16): 1 via ORAL
  Filled 2015-11-30 (×16): qty 1

## 2015-11-30 MED ORDER — INSULIN ASPART 100 UNIT/ML ~~LOC~~ SOLN
0.0000 [IU] | SUBCUTANEOUS | Status: DC
  Administered 2015-11-30: 1 [IU] via SUBCUTANEOUS
  Administered 2015-11-30: 2 [IU] via SUBCUTANEOUS
  Administered 2015-11-30 (×2): 1 [IU] via SUBCUTANEOUS
  Administered 2015-11-30: 2 [IU] via SUBCUTANEOUS

## 2015-11-30 MED ORDER — VANCOMYCIN HCL IN DEXTROSE 1-5 GM/200ML-% IV SOLN
1000.0000 mg | Freq: Every day | INTRAVENOUS | Status: DC
  Administered 2015-11-30: 1000 mg via INTRAVENOUS
  Filled 2015-11-30: qty 200

## 2015-11-30 MED ORDER — PROPOFOL 500 MG/50ML IV EMUL
INTRAVENOUS | Status: DC | PRN
  Administered 2015-11-30: 100 ug/kg/min via INTRAVENOUS

## 2015-11-30 MED ORDER — PHENYLEPHRINE HCL 10 MG/ML IJ SOLN
INTRAMUSCULAR | Status: DC | PRN
  Administered 2015-11-30: 120 ug via INTRAVENOUS
  Administered 2015-11-30: 100 ug via INTRAVENOUS
  Administered 2015-11-30: 180 ug via INTRAVENOUS

## 2015-11-30 MED ORDER — CHLORHEXIDINE GLUCONATE CLOTH 2 % EX PADS
6.0000 | MEDICATED_PAD | Freq: Every day | CUTANEOUS | Status: AC
  Administered 2015-11-30 – 2015-12-04 (×5): 6 via TOPICAL

## 2015-11-30 MED ORDER — PANTOPRAZOLE SODIUM 40 MG IV SOLR: 40.0000 mg | Freq: Two times a day (BID) | INTRAVENOUS | Status: DC

## 2015-11-30 MED ORDER — ONDANSETRON HCL 4 MG PO TABS: 4.0000 mg | ORAL_TABLET | Freq: Four times a day (QID) | ORAL | Status: DC | PRN

## 2015-11-30 MED ORDER — SODIUM CHLORIDE 0.9% FLUSH
3.0000 mL | Freq: Two times a day (BID) | INTRAVENOUS | Status: DC
  Administered 2015-11-30 – 2015-12-03 (×7): 3 mL via INTRAVENOUS

## 2015-11-30 MED ORDER — ONDANSETRON HCL 4 MG/2ML IJ SOLN: 4.0000 mg | Freq: Four times a day (QID) | INTRAMUSCULAR | Status: DC | PRN

## 2015-11-30 MED ORDER — LEVOTHYROXINE SODIUM 50 MCG PO TABS
150.0000 ug | ORAL_TABLET | Freq: Every day | ORAL | Status: DC
  Administered 2015-12-01 – 2015-12-04 (×4): 150 ug via ORAL
  Filled 2015-11-30 (×4): qty 1

## 2015-11-30 MED ORDER — SODIUM CHLORIDE 0.9 % IV SOLN
80.0000 mg | Freq: Once | INTRAVENOUS | Status: DC
  Filled 2015-11-30: qty 80

## 2015-11-30 NOTE — Anesthesia Postprocedure Evaluation (Signed)
Anesthesia Post Note  Patient: [name]  Procedure(s) Performed: Procedure(s) (LRB): ESOPHAGOGASTRODUODENOSCOPY (EGD) (N/A)  Patient location during evaluation: PACU Anesthesia Type: MAC Level of consciousness: awake and alert Pain management: pain level controlled Vital Signs Assessment: post-procedure vital signs reviewed and stable Respiratory status: spontaneous breathing, nonlabored ventilation, respiratory function stable and patient connected to nasal cannula oxygen Cardiovascular status: stable and blood pressure returned to baseline Anesthetic complications: no    Last Vitals:  Filed Vitals:   11/30/15 1320 11/30/15 1326  BP: 107/47   Pulse: 75   Temp:  36.4 C  Resp: 16     Last Pain:  Filed Vitals:   11/30/15 1328  PainSc: 0-No pain                 Zeda Gangwer J

## 2015-11-30 NOTE — Transfer of Care (Signed)
Immediate Anesthesia Transfer of Care Note  Patient: [name]  Procedure(s) Performed: Procedure(s): ESOPHAGOGASTRODUODENOSCOPY (EGD) (N/A)  Patient Location: PACU  Anesthesia Type:MAC  Level of Consciousness: sedated, patient cooperative and responds to stimulation  Airway & Oxygen Therapy: Patient Spontanous Breathing and Patient connected to nasal cannula oxygen  Post-op Assessment: Report given to RN and Post -op Vital signs reviewed and stable  Post vital signs: Reviewed and stable  Last Vitals:  Filed Vitals:   11/30/15 0407 11/30/15 1135  BP: 101/55 102/52  Pulse: 89   Temp: 36.7 C 36.6 C  Resp: 20     Last Pain:  Filed Vitals:   11/30/15 1136  PainSc: 0-No pain         Complications: No apparent anesthesia complications

## 2015-11-30 NOTE — Op Note (Signed)
[facility] Patient Name: [name] Procedure Date: 11/30/2015 MRN: [id number] Attending MD: Shirley FriarVincent C Inga Noller , MD Date of Birth: Nov 20, 1924 CSN: [id number] Age: [id number] Admit Type: Inpatient Procedure:                Upper GI endoscopy Indications:              Hematemesis Providers:                Shirley FriarVincent C. Marlisa Caridi, MD, Dwain SarnaPatricia Ford, RN,                            Kandice RobinsonsGuillaume Awaka, Technician, Mirian MoKelley Carver, CRNA Referring MD:              Medicines:                Propofol per Anesthesia, Monitored Anesthesia Care Complications:            No immediate complications. Estimated Blood Loss:     Estimated blood loss: none. Procedure:                Pre-Anesthesia Assessment:                           - Prior to the procedure, a History and Physical                            was performed, and patient medications and                            allergies were reviewed. The patient's tolerance of                            previous anesthesia was also reviewed. The risks                            and benefits of the procedure and the sedation                            options and risks were discussed with the patient.                            All questions were answered, and informed consent                            was obtained. Prior Anticoagulants: The patient has                            taken no previous anticoagulant or antiplatelet                            agents. ASA Grade Assessment: III - A patient with                            severe systemic disease. After reviewing the risks  and benefits, the patient was deemed in                            satisfactory condition to undergo the procedure.                           After obtaining informed consent, the endoscope was                            passed under direct vision. Throughout the                            procedure, the patient's blood pressure, pulse, and                         oxygen saturations were monitored continuously. The                            EG-[id number] ([id number]) scope was introduced through the                            mouth, and advanced to the second part of duodenum.                            The upper GI endoscopy was accomplished without                            difficulty. The patient tolerated the procedure                            well. Scope In: Scope Out: Findings:      A large non-bleeding Mallory-Weiss tear with stigmata of recent bleeding       was found.      One cratered esophageal ulcer with no bleeding and stigmata of recent       bleeding was found at the gastroesophageal junction.      A small hiatal hernia was present.      The exam of the stomach was otherwise normal.      The examined duodenum was normal. Impression:               - Mallory-Weiss tear.                           - Non-bleeding esophageal ulcer.                           - Small hiatal hernia.                           - Normal examined duodenum.                           - No specimens collected. Moderate Sedation:      N/A- Per Anesthesia Care Recommendation:           - Give Protonix (pantoprazole): 8 mg/hr IV by  continuous infusion.                           - NPO.                           - No aspirin, ibuprofen, naproxen, or other                            non-steroidal anti-inflammatory drugs for 2 weeks. Procedure Code(s):        --- Professional ---                           [phone], Esophagogastroduodenoscopy, flexible,                            transoral; diagnostic, including collection of                            specimen(s) by brushing or washing, when performed                            (separate procedure) Diagnosis Code(s):        --- Professional ---                           K92.0, Hematemesis                           K22.6, Gastro-esophageal laceration-hemorrhage                             syndrome                           K22.10, Ulcer of esophagus without bleeding                           K44.9, Diaphragmatic hernia without obstruction or                            gangrene CPT copyright 2016 [employer]. All rights reserved. The codes documented in this report are preliminary and upon coder review may  be revised to meet current compliance requirements. Shirley FriarVincent C Shyquan Stallbaumer, MD 11/30/2015 12:38:42 PM This report has been signed electronically. Number of Addenda: 0

## 2015-11-30 NOTE — Progress Notes (Signed)
PROGRESS NOTE    [name]  EAV:[id number] DOB: 1924-08-04 DOA: 11/29/2015  PCP: Tonette LedererKIMBERLY A STEGMAYER, PA-C   Brief Narrative:  82 y/o with PVD on Aspirin and Plavix, osteomyelitis of left heal (MRSA and Proteus) s/p left 1st ray amputation, venous insufficiency on Lasix, Parkinson's, A-fib (on Aspirin, Plavix and Amio), CAD, NIDDM 2, Ao Stenosis, HTN presents with hematemesis. He was at a SNF [facility].   Subjective: No complaints. No pain. No nausea.   Assessment & Plan:   Principal Problem:   Upper GI bleed - has undergone EGD today showing a Mallory Weiss tear - NPO, Protonix infusion- follow H and H carefully  Active Problems:    Acute blood loss anemia - - Hb 7.1 - transfuse 2 U PRBC today   -  Gangrene b/l heals - left 1st ray amputation for osteomyelitis - L heal wound grew out MRSA, enteroccus and the proteus. - on Vanc and Zosyn via PICC- 2 wk course from day of surgery - due to continue through 6/7- being managed by Dr Sampson GoonFitzgerald     Non-insulin dependent type 2 diabetes mellitus - Januvia on hold- cont SSI     PVD (peripheral vascular disease) - 6/22- recanalization left lower extremity with angioplasty of the posterior tibial artery for limb salvage  - 6/22 - Excisional debridement of necrotic eschar from posterior left heel ulceration - 6/23- recanalization right lower extremity and aspiration of thrombus from popliteal and post tibial arteries - Aspirin and Plavix on hold  CAD with Stent - ASA/ Plavix on hold  CKD 3 - Cr 1.27 - follow tomorrow  Parkinson's disease - Sinemet  Gout - Colchicine on hold  A-fib - Amiodarone- no anticoagulation  BPH - Flomax-   Hypothyroid - Synthroid    DNR (do not resuscitate)   DVT prophylaxis: sCDs Code Status: DNR Family Communication: daughter Disposition Plan: return to SNF in 2-3 days Consultants:   GI Procedures:   EGD Antimicrobials:  Anti-infectives    Start     Dose/Rate Route Frequency Ordered Stop   11/30/15 0130  vancomycin (VANCOCIN) IVPB 1000 mg/200 mL premix     1,000 mg 200 mL/hr over 60 Minutes Intravenous Daily at bedtime 11/30/15 0047     11/30/15 0100  piperacillin-tazobactam (ZOSYN) IVPB 3.375 g     3.375 g 12.5 mL/hr over 240 Minutes Intravenous Every 8 hours 11/30/15 0047         Objective: Filed Vitals:   11/29/15 2255 11/30/15 0407  BP: 110/64 101/55  Pulse: 105 89  Temp: 97.9 F (36.6 C) 98.1 F (36.7 C)  TempSrc: Oral Axillary  Resp: 24 20  Height: 6" (0.152 m)   Weight: 78.8 kg (173 lb 11.6 oz)   SpO2: 100% 97%    Intake/Output Summary (Last 24 hours) at 11/30/15 0841 Last data filed at 11/30/15 0837  Gross per 24 hour  Intake     30 ml  Output     50 ml  Net    -20 ml   Filed Weights   11/29/15 2255  Weight: 78.8 kg (173 lb 11.6 oz)    Examination: General exam: Appears comfortable  HEENT: PERRLA, oral mucosa moist, no sclera icterus or thrush Respiratory system: Clear to auscultation. Respiratory effort normal. Cardiovascular system: S1 & S2 heard, RRR.  No murmurs  Gastrointestinal system: Abdomen soft, non-tender, nondistended. Normal bowel sound. No organomegaly Central nervous system: Alert and oriented. No focal neurological deficits. Extremities:  No cyanosis, clubbing or edema Skin: dressing on both heals not opened Psychiatry:  Mood & affect appropriate.     Data Reviewed: I have personally reviewed following labs and imaging studies  CBC:  Recent Labs Lab 11/25/15 0500 11/29/15 1000 11/29/15 1847 11/30/15 0011 11/30/15 0330  WBC 7.2 8.8 11.1* 13.5*  --   NEUTROABS  --  6.7* 8.2*  --   --   HGB 10.1* 8.8* 9.1* 8.4* 7.2*  HCT 30.0* 25.4* 27.2* 26.3* 21.9*  MCV 89.5 88.8 90.6 91.0  --   PLT 248 222 266 308  --    Basic Metabolic Panel:  Recent Labs Lab 11/25/15 0500 11/26/15 0600 11/28/15 0434 11/29/15 1000 11/29/15 1847  NA 138 141  --  138 137  K 3.4* 3.7   --  3.9 4.3  CL 108 111  --  103 102  CO2 26 25  --  27 24  GLUCOSE 118* 142*  --  158* 175*  BUN 17 14  --  17 25*  CREATININE 1.03 1.10 1.10 1.20 1.27*  CALCIUM 8.2* 8.4*  --  8.4* 8.7*  MG 1.8  --   --   --   --    GFR: CrCl cannot be calculated (Unknown ideal weight.). Liver Function Tests:  Recent Labs Lab 11/29/15 1000 11/29/15 1847  AST 13* 14*  ALT 8* 9*  ALKPHOS 147* 151*  BILITOT 0.9 0.6  PROT 5.2* 5.6*  ALBUMIN 2.3* 2.6*   No results for input(s): LIPASE, AMYLASE in the last 168 hours. No results for input(s): AMMONIA in the last 168 hours. Coagulation Profile:  Recent Labs Lab 11/29/15 1847  INR 1.23   Cardiac Enzymes:  Recent Labs Lab 11/29/15 1847  TROPONINI <0.03   BNP (last 3 results) No results for input(s): PROBNP in the last 8760 hours. HbA1C: No results for input(s): HGBA1C in the last 72 hours. CBG:  Recent Labs Lab 11/29/15 1218 11/29/15 1700 11/30/15 0120 11/30/15 0405 11/30/15 0758  GLUCAP 126* 160* 159* 154* 145*   Lipid Profile: No results for input(s): CHOL, HDL, LDLCALC, TRIG, CHOLHDL, LDLDIRECT in the last 72 hours. Thyroid Function Tests: No results for input(s): TSH, T4TOTAL, FREET4, T3FREE, THYROIDAB in the last 72 hours. Anemia Panel: No results for input(s): VITAMINB12, FOLATE, FERRITIN, TIBC, IRON, RETICCTPCT in the last 72 hours. Urine analysis:    Component Value Date/Time   COLORURINE YELLOW* 11/29/2015 2116   APPEARANCEUR CLEAR* 11/29/2015 2116   APPEARANCEUR Cloudy* 09/27/2015 1506   LABSPEC 1.025 11/29/2015 2116   PHURINE 5.0 11/29/2015 2116   GLUCOSEU NEGATIVE 11/29/2015 2116   HGBUR NEGATIVE 11/29/2015 2116   BILIRUBINUR NEGATIVE 11/29/2015 2116   BILIRUBINUR Negative 09/27/2015 1506   KETONESUR TRACE* 11/29/2015 2116   PROTEINUR NEGATIVE 11/29/2015 2116   PROTEINUR 1+* 09/27/2015 1506   NITRITE NEGATIVE 11/29/2015 2116   NITRITE Positive* 09/27/2015 1506   LEUKOCYTESUR NEGATIVE 11/29/2015 2116    LEUKOCYTESUR 2+* 09/27/2015 1506   Sepsis Labs: @LABRCNTIP (procalcitonin:4,lacticidven:4) ) Recent Results (from the past 240 hour(s))  Blood culture (routine x 2)     Status: None   Collection Time: 11/21/15 12:37 PM  Result Value Ref Range Status   Specimen Description BLOOD LEFT HAND  Final   Special Requests   Final    BOTTLES DRAWN AEROBIC AND ANAEROBIC  AERO 10CC ANA 7CC   Culture NO GROWTH 5 DAYS  Final   Report Status 11/26/2015 FINAL  Final  Blood culture (routine x 2)  Status: None   Collection Time: 11/21/15 12:37 PM  Result Value Ref Range Status   Specimen Description BLOOD RIGHT FATTY CASTS  Final   Special Requests BOTTLES DRAWN AEROBIC AND ANAEROBIC  1CC  Final   Culture NO GROWTH 5 DAYS  Final   Report Status 11/26/2015 FINAL  Final         Radiology Studies: Dg Chest Port 1 View  11/30/2015  CLINICAL DATA:  GI bleed. EXAM: PORTABLE CHEST 1 VIEW COMPARISON:  November 08, 2015 FINDINGS: The right PICC line is stable. There is elevation of the right hemidiaphragm. Increased interstitial markings suggest mild edema. The heart, hila, mediastinum, lungs, and pleura are otherwise unchanged. IMPRESSION: Mild edema. Electronically Signed   By: Gerome Sam III M.D   On: 11/30/2015 01:32      Scheduled Meds: . sodium chloride   Intravenous Once  . carbidopa-levodopa  1 tablet Oral TID WC & HS  . Chlorhexidine Gluconate Cloth  6 each Topical Q0600  . insulin aspart  0-9 Units Subcutaneous Q4H  . levothyroxine  150 mcg Oral QAC breakfast  . mupirocin ointment  1 application Nasal BID  . [START ON 12/03/2015] pantoprazole  40 mg Intravenous Q12H  . piperacillin-tazobactam  3.375 g Intravenous Q8H  . sodium chloride flush  10-40 mL Intracatheter Q12H  . sodium chloride flush  3 mL Intravenous Q12H  . vancomycin  1,000 mg Intravenous QHS   Continuous Infusions: . sodium chloride 40 mL/hr at 11/30/15 0233  . pantoprozole (PROTONIX) infusion 8 mg/hr (11/30/15 0232)      LOS: 1 day    Time spent in minutes: 35    Jeni Duling, MD [facility] Pager: [email or web address] Password [email or web address] 11/30/2015, 8:41 AM

## 2015-11-30 NOTE — Progress Notes (Signed)
Received report of H/H 7.2.. NP notified of results. No actvie bleeding noted, MD reported--GI consult this am. Will cont to monitor and noitify again of changes. VSs unchanged. SRP, RN

## 2015-11-30 NOTE — Brief Op Note (Signed)
Large Mallory-Weiss tear with signs of recent bleeding but no active bleeding occurring. See procedure report for details. Keep NPO (except ice chips and sips with meds ok). Continue Protonix drip. Follow H/H. Agree with transfusion. Supportive care.

## 2015-11-30 NOTE — Progress Notes (Addendum)
PT Cancellation Note  Patient Details Name: Henry Russellmer K Zendejas MRN: [id number] DOB: [birth year]   Cancelled Treatment:    Reason Eval/Treat Not Completed: Medical issues which prohibited therapy. Hgb 7.1-pt to receive 2 units of blood. Also scheduled for EGD on today. Will hold PT today and check back on tomorrow.    Rebeca AlertJannie Nylene Inlow, MPT Pager: [phone]

## 2015-11-30 NOTE — Progress Notes (Addendum)
Pharmacy Antibiotic Note  [name] is a 82 y.o. male admitted on 11/29/2015 with chronic osteomyelitis.  Pharmacy has been consulted for Vanc/Zosyn dosing. 82 [facility]. Was just discharged from [facility] on 6/26 had left foot ulcer that resulted in toe amputation. Would cultures grew MRSA and proteus and patient was discharged to SNF on vanc/zosyn with plans to complete abx's on 7/7 per ID (14 days after last surgery).  MD [facility]   - [facility] readmission for debridement worsening of osteo received Zosyn and 1g vanc 6/19 then random vanc level was 30, started on vanc 1g q12 thereafter and trough was 27 but drawn after dose started, trough next AM was 37 so vanc was stopped, trough following day was 17 so vanc was restarted 1g q24 and was discharged on this dose with plan to cont till 7/7 - [place] 6/2-6/7 received Vanc and Zosyn. Trough of 24 on 6/6 and Vanc changed to 750mg  IV q12 and discharged on these. S/p surgical amputation first ray on 6/3, culture grew MRSA, enterococcus and proteus   Plan: Continue Vancomycin 1g q24 and ZEI q8 per prior to admission regimen for now  - Will recheck vanc trough tonight prior to next dose (last trough from what I can tell was 6/22 and then a dose change occurred) - Suggest ID consult if possible due to complexity stated above  Height: 6' (182.9 cm) Weight: 173 lb 11.6 oz (78.8 kg) IBW/kg (Calculated) : 77.6  Temp (24hrs), Avg:97.9 F (36.6 C), Min:97.6 F (36.4 C), Max:98.1 F (36.7 C)   Recent Labs Lab 11/24/15 0645 11/25/15 0500 11/26/15 0600 11/28/15 0434 11/29/15 1000 11/29/15 1847 11/30/15 0011  WBC  --  7.2  --   --  8.8 11.1* 13.5*  CREATININE  --  1.03 1.10 1.10 1.20 1.27*  --   VANCOTROUGH 17  --   --   --   --   --   --     Estimated Creatinine Clearance: 44.1 mL/min (by C-G formula based on Cr of 1.27).    No Known Allergies  Antimicrobials this  admission: 6/2 Vanc >> (7/7) 6/2 Zosyn >> (7/7)  Microbiology results: 5/30 L heel wound: enterococcus - pansensitive, MRSA - sensitive to vanc, Bactrim, TCN, rifampin, and clindamycin and proteus - pansensitive at that time 6/3 wound: proteus - now resistant to amp, Unasyn, Ancef, cefepime, Rocephin and sensitive to cipro, gent, Primaxin, Zosyn and Bactrim and MRSA - same as above   Thank you for allowing pharmacy to be a part of this patient's care.   Hessie KnowsJustin M Leiann Sporer, PharmD, BCPS Pager [phone] 11/30/2015 1:22 PM

## 2015-11-30 NOTE — H&P (View-Only) (Signed)
[facility] Reason for consult: Hematemesis Referring Physician: [facility] hospitalist. PCP:Ms Stegmayer PA-C. Primary G.I.: none patient is unassigned  [name] is an 82 y.o. male.  HPI: he has a history of Parkinson's, type 2 diabetes and fairly significant peripheral vascular disease. He is positive for MRSA and has had infections of heel decubitus ulcers and has had multiple procedures related to this. He's had angioplasty to the  vessels of his lower extremities. He has chronic atrial fibrillation but according to the patient and his daughter has not been on any anticoagulants other than aspirin and Plavix. He has had a lot of pain related to his osteoarthritis and osteomyelitis and vascular disease of his lower extremities and diabetic neuropathy. Prior to his recent hospitalization in [place] he was taking some Aleve at least one every night. He has been in the hospital in [place] several times and was most recently discharged 2 days ago back to a skilled nursing facility. He has been receiving IV Zosyn and vancomycin for his osteomyelitis. During his recent hospitalization in [place] he had a lot of nausea. It appears from the records that he was receiving IV Pepcid at the nursing facility on discharge as well as oxycodone, and Percocet. He does not think he was receiving any NSAID and I do not see any NSAID on his list. He was at the nursing facility  yesterday had 2 episodes of hematemesis described his gelatinous maroonish vomitus. He has not had any melena or hematochezia. He is not had any blood with wiping. He denies ever having had an EGD but had a colonoscopy in [place] a number of years ago and was told everything was okay. He denies abdominal pain currently but did have some abdominal pain poorly localized yesterday. He does remain nauseated. He has had multiple problems related to vascular disease of his lower extremities with multiple procedures and wound  scrolling MRSA, enterococcus, Proteus in the past. He said from back to me that angioplasties of his lower extremity arteries. He has Parkinson's, history of TIAs, CAD with cardiac stents in history of aortic stenosis, chronic atrial fib without the use of anticoagulants treated with aspirin Plavix, hypothyroidism.  Past Medical History  Diagnosis Date  . Coronary artery disease   . Diabetes mellitus without complication (HCC)   . Aortic stenosis   . Hypertension   . Hyperlipidemia   . TIA (transient ischemic attack)   . Thyroid disease   . Hypothyroidism     Past Surgical History  Procedure Laterality Date  . Appendectomy    . Hernia repair    . Knee surgery      bilateral  . Eye surgery    . Colonoscopy    . Cardiac catheterization  2010    showed a patient circumflex stent with noncritical CAD and normal right-sided pressures.  . Coronary angioplasty  01/24/2007    stent placement to the mid circumflex   . Knee surgery Left   . Peripheral vascular catheterization N/A 08/30/2015    Procedure: Abdominal Aortogram w/Lower Extremity;  Surgeon: Renford DillsGregory G Schnier, MD;  Location: [facility];  Service: Cardiovascular;  Laterality: N/A;  . Peripheral vascular catheterization  08/30/2015    Procedure: Lower Extremity Intervention;  Surgeon: Renford DillsGregory G Schnier, MD;  Location: [facility];  Service: Cardiovascular;;  . Amputation toe Left 11/05/2015    Procedure: AMPUTATION TOE;  Surgeon: Recardo EvangelistMatthew Troxler, DPM;  Location: [facility];  Service: Podiatry;  Laterality: Left;  .  Irrigation and debridement foot Left 11/05/2015    Procedure: IRRIGATION AND DEBRIDEMENT FOOT / HEEL ULCER;  Surgeon: Recardo Evangelist, DPM;  Location: [facility];  Service: Podiatry;  Laterality: Left;  . Application of wound vac Left 11/05/2015    Procedure: APPLICATION OF WOUND VAC;  Surgeon: Recardo Evangelist, DPM;  Location: [facility];  Service: Podiatry;  Laterality: Left;  . Peripheral vascular catheterization  Left 11/23/2015    Procedure: Abdominal Aortogram w/Lower Extremity;  Surgeon: Renford Dills, MD;  Location: [facility];  Service: Cardiovascular;  Laterality: Left;  . Peripheral vascular catheterization  11/23/2015    Procedure: Lower Extremity Intervention;  Surgeon: Renford Dills, MD;  Location: [facility];  Service: Cardiovascular;;  . Irrigation and debridement foot Left 11/24/2015    Procedure: IRRIGATION AND DEBRIDEMENT FOOT;  Surgeon: Recardo Evangelist, DPM;  Location: [facility];  Service: Podiatry;  Laterality: Left;  . Peripheral vascular catheterization Right 11/25/2015    Procedure: Lower Extremity Angiography;  Surgeon: Renford Dills, MD;  Location: [facility];  Service: Cardiovascular;  Laterality: Right;    Family History  Problem Relation Age of Onset  . Heart disease Mother   . Heart attack Father 53    MI  . Heart attack Brother 37    MI    Social History:  reports that he has never smoked. He has never used smokeless tobacco. He reports that he does not drink alcohol or use illicit drugs.  Allergies: No Known Allergies  Medications; Prior to Admission medications   Medication Sig Start Date End Date Taking? Authorizing Provider  Amino Acids-Protein Hydrolys (FEEDING SUPPLEMENT, PRO-STAT SUGAR FREE 64,) LIQD Take 30 mLs by mouth 2 (two) times daily between meals.    Historical Provider, MD  amiodarone (PACERONE) 100 MG tablet Take 100 mg by mouth daily.    Historical Provider, MD  carbidopa-levodopa (SINEMET IR) 25-100 MG per tablet Take 1 tablet by mouth 4 (four) times daily.    Historical Provider, MD  colchicine 0.6 MG tablet Take 0.6 mg by mouth daily.  11/29/15 12/08/15  Historical Provider, MD  Famotidine (PEPCID IV) Inject 20 mg into the vein every 12 (twelve) hours. 11/29/15 12/02/15  Historical Provider, MD  feeding supplement, GLUCERNA SHAKE, (GLUCERNA SHAKE) LIQD Take 237 mLs by mouth 3 (three) times daily between meals.  11/09/15   Auburn Bilberry, MD  furosemide (LASIX) 40 MG tablet Take 1 tablet (40 mg total) by mouth 2 (two) times daily. 07/29/15   Antonieta Iba, MD  heparin flush 10 UNIT/ML SOLN injection Inject 10 Units into the vein 4 (four) times daily.    Historical Provider, MD  levothyroxine (SYNTHROID, LEVOTHROID) 150 MCG tablet Take 150 mcg by mouth daily before breakfast.    Historical Provider, MD  misoprostol (CYTOTEC) 100 MCG tablet Take 100 mcg by mouth 4 (four) times daily.    Historical Provider, MD  oxyCODONE-acetaminophen (PERCOCET/ROXICET) 5-325 MG tablet Take 1 tablet by mouth every 4 (four) hours as needed for moderate pain. 11/09/15   Auburn Bilberry, MD  piperacillin-tazobactam (ZOSYN) 3.375 GM/50ML IVPB Inject 50 mLs (3.375 g total) into the vein every 8 (eight) hours. 11/28/15 12/09/15  Altamese Dilling, MD  potassium chloride (K-DUR,KLOR-CON) 10 MEQ tablet Take 10 mEq by mouth daily.     Historical Provider, MD  pramipexole (MIRAPEX) 1 MG tablet Take 1 mg by mouth 4 (four) times daily.     Historical Provider, MD  sitaGLIPtin (JANUVIA) 100 MG tablet  Take 1 tablet (100 mg total) by mouth daily. 07/11/15   Steele Sizer, MD  sodium chloride flush 0.9 % SOLN injection Inject 10 mLs into the vein 4 (four) times daily.    Historical Provider, MD  tamsulosin (FLOMAX) 0.4 MG CAPS capsule Take 1 capsule (0.4 mg total) by mouth daily after supper. 10/17/15   Steele Sizer, MD  vancomycin (VANCOCIN) 1-5 GM/200ML-% SOLN Inject 200 mLs (1,000 mg total) into the vein daily. 11/28/15 12/09/15  Altamese Dilling, MD   . sodium chloride   Intravenous Once  . carbidopa-levodopa  1 tablet Oral TID WC & HS  . Chlorhexidine Gluconate Cloth  6 each Topical Q0600  . insulin aspart  0-9 Units Subcutaneous Q4H  . levothyroxine  150 mcg Oral QAC breakfast  . mupirocin ointment  1 application Nasal BID  . [START ON 12/03/2015] pantoprazole  40 mg Intravenous Q12H  . piperacillin-tazobactam  3.375 g Intravenous  Q8H  . sodium chloride flush  10-40 mL Intracatheter Q12H  . sodium chloride flush  3 mL Intravenous Q12H  . vancomycin  1,000 mg Intravenous QHS   PRN Meds ondansetron **OR** ondansetron (ZOFRAN) IV, sodium chloride flush Results for orders placed or performed during the hospital encounter of 11/29/15 (from the past 48 hour(s))  CBC     Status: Abnormal   Collection Time: 11/30/15 12:11 AM  Result Value Ref Range   WBC 13.5 (H) 4.0 - 10.5 K/uL   RBC 2.89 (L) 4.22 - 5.81 MIL/uL   Hemoglobin 8.4 (L) 13.0 - 17.0 g/dL   HCT 40.9 (L) 81.1 - 91.4 %   MCV 91.0 78.0 - 100.0 fL   MCH 29.1 26.0 - 34.0 pg   MCHC 31.9 30.0 - 36.0 g/dL   RDW 78.2 (H) 95.6 - 21.3 %   Platelets 308 150 - 400 K/uL  Glucose, capillary     Status: Abnormal   Collection Time: 11/30/15  1:20 AM  Result Value Ref Range   Glucose-Capillary 159 (H) 65 - 99 mg/dL  Hemoglobin and hematocrit, blood     Status: Abnormal   Collection Time: 11/30/15  3:30 AM  Result Value Ref Range   Hemoglobin 7.2 (L) 13.0 - 17.0 g/dL   HCT 08.6 (L) 57.8 - 46.9 %  Glucose, capillary     Status: Abnormal   Collection Time: 11/30/15  4:05 AM  Result Value Ref Range   Glucose-Capillary 154 (H) 65 - 99 mg/dL    Dg Chest Port 1 View  11/30/2015  CLINICAL DATA:  GI bleed. EXAM: PORTABLE CHEST 1 VIEW COMPARISON:  November 08, 2015 FINDINGS: The right PICC line is stable. There is elevation of the right hemidiaphragm. Increased interstitial markings suggest mild edema. The heart, hila, mediastinum, lungs, and pleura are otherwise unchanged. IMPRESSION: Mild edema. Electronically Signed   By: Gerome Sam III M.D   On: 11/30/2015 01:32   :            Blood pressure 101/55, pulse 89, temperature 98.1 F (36.7 C), temperature source Axillary, resp. rate 20, height 6" (0.152 m), weight 78.8 kg (173 lb 11.6 oz), SpO2 97 %.  Physical exam:   General--Frail appearing white male in no distress. ENT-- nonicteric Neck-- a  lymphadenopathy Heart-- decreased heart sounds Lungs-- clear bilaterally Abdomen-- nondistended incompletely soft with no tenderness to exam currently. Bowel sounds are normal. He actually indicates the lower abdomen as the area in which he was uncomfortable yesterday Psych-- alert and oriented answers questions  appropriately   Assessment: 1. Hematemesis. Probably represents an ulcer. He had been taking some Aleve prior to his recent hospitalization he had been on IV H2 blocker and I agree with changing to IV PPI. 2. Severe peripheral vascular disease. Multiple prior procedures with current osteomyelitis requiring IV antibiotics 3. Type II diabetes 4. Parkinson's disease  Plan: have discussed with patient and daughter. Would go ahead and transfused to keep his hemoglobin overrate. We will plan on EGD later this morning by Dr. Bosie ClosSchooler. Agree with changing to PPI rather than H2 blockers. Have discussed the procedure with the patient and daughter.   [name],Fahad Cisse L 11/30/2015, 8:05 AM   This note was created using voice recognition software and minor errors may Have occurred unintentionally. Pager: [phone] If no answer or after hours call [phone]

## 2015-11-30 NOTE — Anesthesia Preprocedure Evaluation (Addendum)
Anesthesia Evaluation  Patient identified by MRN, date of birth, ID band Patient awake    Reviewed: Allergy & Precautions, NPO status , Patient's Chart, lab work & pertinent test results  Airway Mallampati: II  TM Distance: >3 FB Neck ROM: Full    Dental no notable dental hx.    Pulmonary neg pulmonary ROS,    Pulmonary exam normal breath sounds clear to auscultation       Cardiovascular hypertension, + CAD, + Peripheral Vascular Disease and +CHF  Normal cardiovascular exam Rhythm:Regular Rate:Normal     Neuro/Psych TIAnegative psych ROS   GI/Hepatic negative GI ROS, Neg liver ROS,   Endo/Other  diabetes, Type 2Hypothyroidism   Renal/GU negative Renal ROS  negative genitourinary   Musculoskeletal negative musculoskeletal ROS (+)   Abdominal   Peds negative pediatric ROS (+)  Hematology  (+) anemia ,   Anesthesia Other Findings   Reproductive/Obstetrics negative OB ROS                            Anesthesia Physical Anesthesia Plan  ASA: III  Anesthesia Plan: MAC   Post-op Pain Management:    Induction: Intravenous  Airway Management Planned: Natural Airway  Additional Equipment:   Intra-op Plan:   Post-operative Plan:   Informed Consent: I have reviewed the patients History and Physical, chart, labs and discussed the procedure including the risks, benefits and alternatives for the proposed anesthesia with the patient or authorized representative who has indicated his/her understanding and acceptance.   Dental advisory given  Plan Discussed with: CRNA  Anesthesia Plan Comments: (HGB 7.1    ; pRBCs not ready . Will start transfusion when ready.)       Anesthesia Quick Evaluation

## 2015-11-30 NOTE — Consult Note (Signed)
[facility] Reason for consult: Hematemesis Referring Physician: [facility] hospitalist. PCP:Ms Stegmayer PA-C. Primary G.I.: none patient is unassigned  [name] is an 82 y.o. male.  HPI: he has a history of Parkinson's, type 2 diabetes and fairly significant peripheral vascular disease. He is positive for MRSA and has had infections of heel decubitus ulcers and has had multiple procedures related to this. He's had angioplasty to the  vessels of his lower extremities. He has chronic atrial fibrillation but according to the patient and his daughter has not been on any anticoagulants other than aspirin and Plavix. He has had a lot of pain related to his osteoarthritis and osteomyelitis and vascular disease of his lower extremities and diabetic neuropathy. Prior to his recent hospitalization in [place] he was taking some Aleve at least one every night. He has been in the hospital in [place] several times and was most recently discharged 2 days ago back to a skilled nursing facility. He has been receiving IV Zosyn and vancomycin for his osteomyelitis. During his recent hospitalization in [place] he had a lot of nausea. It appears from the records that he was receiving IV Pepcid at the nursing facility on discharge as well as oxycodone, and Percocet. He does not think he was receiving any NSAID and I do not see any NSAID on his list. He was at the nursing facility  yesterday had 2 episodes of hematemesis described his gelatinous maroonish vomitus. He has not had any melena or hematochezia. He is not had any blood with wiping. He denies ever having had an EGD but had a colonoscopy in [place] a number of years ago and was told everything was okay. He denies abdominal pain currently but did have some abdominal pain poorly localized yesterday. He does remain nauseated. He has had multiple problems related to vascular disease of his lower extremities with multiple procedures and wound  scrolling MRSA, enterococcus, Proteus in the past. He said from back to me that angioplasties of his lower extremity arteries. He has Parkinson's, history of TIAs, CAD with cardiac stents in history of aortic stenosis, chronic atrial fib without the use of anticoagulants treated with aspirin Plavix, hypothyroidism.  Past Medical History  Diagnosis Date  . Coronary artery disease   . Diabetes mellitus without complication (HCC)   . Aortic stenosis   . Hypertension   . Hyperlipidemia   . TIA (transient ischemic attack)   . Thyroid disease   . Hypothyroidism     Past Surgical History  Procedure Laterality Date  . Appendectomy    . Hernia repair    . Knee surgery      bilateral  . Eye surgery    . Colonoscopy    . Cardiac catheterization  2010    showed a patient circumflex stent with noncritical CAD and normal right-sided pressures.  . Coronary angioplasty  01/24/2007    stent placement to the mid circumflex   . Knee surgery Left   . Peripheral vascular catheterization N/A 08/30/2015    Procedure: Abdominal Aortogram w/Lower Extremity;  Surgeon: Gregory G Schnier, MD;  Location: [facility];  Service: Cardiovascular;  Laterality: N/A;  . Peripheral vascular catheterization  08/30/2015    Procedure: Lower Extremity Intervention;  Surgeon: Gregory G Schnier, MD;  Location: [facility];  Service: Cardiovascular;;  . Amputation toe Left 11/05/2015    Procedure: AMPUTATION TOE;  Surgeon: Matthew Troxler, DPM;  Location: [facility];  Service: Podiatry;  Laterality: Left;  .   Irrigation and debridement foot Left 11/05/2015    Procedure: IRRIGATION AND DEBRIDEMENT FOOT / HEEL ULCER;  Surgeon: Matthew Troxler, DPM;  Location: [facility];  Service: Podiatry;  Laterality: Left;  . Application of wound vac Left 11/05/2015    Procedure: APPLICATION OF WOUND VAC;  Surgeon: Matthew Troxler, DPM;  Location: [facility];  Service: Podiatry;  Laterality: Left;  . Peripheral vascular catheterization  Left 11/23/2015    Procedure: Abdominal Aortogram w/Lower Extremity;  Surgeon: Gregory G Schnier, MD;  Location: [facility];  Service: Cardiovascular;  Laterality: Left;  . Peripheral vascular catheterization  11/23/2015    Procedure: Lower Extremity Intervention;  Surgeon: Gregory G Schnier, MD;  Location: [facility];  Service: Cardiovascular;;  . Irrigation and debridement foot Left 11/24/2015    Procedure: IRRIGATION AND DEBRIDEMENT FOOT;  Surgeon: Matthew Troxler, DPM;  Location: [facility];  Service: Podiatry;  Laterality: Left;  . Peripheral vascular catheterization Right 11/25/2015    Procedure: Lower Extremity Angiography;  Surgeon: Gregory G Schnier, MD;  Location: [facility];  Service: Cardiovascular;  Laterality: Right;    Family History  Problem Relation Age of Onset  . Heart disease Mother   . Heart attack Father 62    MI  . Heart attack Brother 39    MI    Social History:  reports that he has never smoked. He has never used smokeless tobacco. He reports that he does not drink alcohol or use illicit drugs.  Allergies: No Known Allergies  Medications; Prior to Admission medications   Medication Sig Start Date End Date Taking? Authorizing Provider  Amino Acids-Protein Hydrolys (FEEDING SUPPLEMENT, PRO-STAT SUGAR FREE 64,) LIQD Take 30 mLs by mouth 2 (two) times daily between meals.    Historical Provider, MD  amiodarone (PACERONE) 100 MG tablet Take 100 mg by mouth daily.    Historical Provider, MD  carbidopa-levodopa (SINEMET IR) 25-100 MG per tablet Take 1 tablet by mouth 4 (four) times daily.    Historical Provider, MD  colchicine 0.6 MG tablet Take 0.6 mg by mouth daily.  11/29/15 12/08/15  Historical Provider, MD  Famotidine (PEPCID IV) Inject 20 mg into the vein every 12 (twelve) hours. 11/29/15 12/02/15  Historical Provider, MD  feeding supplement, GLUCERNA SHAKE, (GLUCERNA SHAKE) LIQD Take 237 mLs by mouth 3 (three) times daily between meals.  11/09/15   Shreyang Patel, MD  furosemide (LASIX) 40 MG tablet Take 1 tablet (40 mg total) by mouth 2 (two) times daily. 07/29/15   Timothy J Gollan, MD  heparin flush 10 UNIT/ML SOLN injection Inject 10 Units into the vein 4 (four) times daily.    Historical Provider, MD  levothyroxine (SYNTHROID, LEVOTHROID) 150 MCG tablet Take 150 mcg by mouth daily before breakfast.    Historical Provider, MD  misoprostol (CYTOTEC) 100 MCG tablet Take 100 mcg by mouth 4 (four) times daily.    Historical Provider, MD  oxyCODONE-acetaminophen (PERCOCET/ROXICET) 5-325 MG tablet Take 1 tablet by mouth every 4 (four) hours as needed for moderate pain. 11/09/15   Shreyang Patel, MD  piperacillin-tazobactam (ZOSYN) 3.375 GM/50ML IVPB Inject 50 mLs (3.375 g total) into the vein every 8 (eight) hours. 11/28/15 12/09/15  Vaibhavkumar Vachhani, MD  potassium chloride (K-DUR,KLOR-CON) 10 MEQ tablet Take 10 mEq by mouth daily.     Historical Provider, MD  pramipexole (MIRAPEX) 1 MG tablet Take 1 mg by mouth 4 (four) times daily.     Historical Provider, MD  sitaGLIPtin (JANUVIA) 100 MG tablet   Take 1 tablet (100 mg total) by mouth daily. 07/11/15   Mark A Crissman, MD  sodium chloride flush 0.9 % SOLN injection Inject 10 mLs into the vein 4 (four) times daily.    Historical Provider, MD  tamsulosin (FLOMAX) 0.4 MG CAPS capsule Take 1 capsule (0.4 mg total) by mouth daily after supper. 10/17/15   Mark A Crissman, MD  vancomycin (VANCOCIN) 1-5 GM/200ML-% SOLN Inject 200 mLs (1,000 mg total) into the vein daily. 11/28/15 12/09/15  Vaibhavkumar Vachhani, MD   . sodium chloride   Intravenous Once  . carbidopa-levodopa  1 tablet Oral TID WC & HS  . Chlorhexidine Gluconate Cloth  6 each Topical Q0600  . insulin aspart  0-9 Units Subcutaneous Q4H  . levothyroxine  150 mcg Oral QAC breakfast  . mupirocin ointment  1 application Nasal BID  . [START ON 12/03/2015] pantoprazole  40 mg Intravenous Q12H  . piperacillin-tazobactam  3.375 g Intravenous  Q8H  . sodium chloride flush  10-40 mL Intracatheter Q12H  . sodium chloride flush  3 mL Intravenous Q12H  . vancomycin  1,000 mg Intravenous QHS   PRN Meds ondansetron **OR** ondansetron (ZOFRAN) IV, sodium chloride flush Results for orders placed or performed during the hospital encounter of 11/29/15 (from the past 48 hour(s))  CBC     Status: Abnormal   Collection Time: 11/30/15 12:11 AM  Result Value Ref Range   WBC 13.5 (H) 4.0 - 10.5 K/uL   RBC 2.89 (L) 4.22 - 5.81 MIL/uL   Hemoglobin 8.4 (L) 13.0 - 17.0 g/dL   HCT 26.3 (L) 39.0 - 52.0 %   MCV 91.0 78.0 - 100.0 fL   MCH 29.1 26.0 - 34.0 pg   MCHC 31.9 30.0 - 36.0 g/dL   RDW 15.9 (H) 11.5 - 15.5 %   Platelets 308 150 - 400 K/uL  Glucose, capillary     Status: Abnormal   Collection Time: 11/30/15  1:20 AM  Result Value Ref Range   Glucose-Capillary 159 (H) 65 - 99 mg/dL  Hemoglobin and hematocrit, blood     Status: Abnormal   Collection Time: 11/30/15  3:30 AM  Result Value Ref Range   Hemoglobin 7.2 (L) 13.0 - 17.0 g/dL   HCT 21.9 (L) 39.0 - 52.0 %  Glucose, capillary     Status: Abnormal   Collection Time: 11/30/15  4:05 AM  Result Value Ref Range   Glucose-Capillary 154 (H) 65 - 99 mg/dL    Dg Chest Port 1 View  11/30/2015  CLINICAL DATA:  GI bleed. EXAM: PORTABLE CHEST 1 VIEW COMPARISON:  November 08, 2015 FINDINGS: The right PICC line is stable. There is elevation of the right hemidiaphragm. Increased interstitial markings suggest mild edema. The heart, hila, mediastinum, lungs, and pleura are otherwise unchanged. IMPRESSION: Mild edema. Electronically Signed   By: David  Williams III M.D   On: 11/30/2015 01:32   :            Blood pressure 101/55, pulse 89, temperature 98.1 F (36.7 C), temperature source Axillary, resp. rate 20, height 6" (0.152 m), weight 78.8 kg (173 lb 11.6 oz), SpO2 97 %.  Physical exam:   General--Frail appearing white male in no distress. ENT-- nonicteric Neck-- a  lymphadenopathy Heart-- decreased heart sounds Lungs-- clear bilaterally Abdomen-- nondistended incompletely soft with no tenderness to exam currently. Bowel sounds are normal. He actually indicates the lower abdomen as the area in which he was uncomfortable yesterday Psych-- alert and oriented answers questions   appropriately   Assessment: 1. Hematemesis. Probably represents an ulcer. He had been taking some Aleve prior to his recent hospitalization he had been on IV H2 blocker and I agree with changing to IV PPI. 2. Severe peripheral vascular disease. Multiple prior procedures with current osteomyelitis requiring IV antibiotics 3. Type II diabetes 4. Parkinson's disease  Plan: have discussed with patient and daughter. Would go ahead and transfused to keep his hemoglobin overrate. We will plan on EGD later this morning by Dr. Schooler. Agree with changing to PPI rather than H2 blockers. Have discussed the procedure with the patient and daughter.   Kaicee Scarpino JR,Janari Gagner L 11/30/2015, 8:05 AM   This note was created using voice recognition software and minor errors may Have occurred unintentionally. Pager: [phone] If no answer or after hours call [phone]     

## 2015-11-30 NOTE — Progress Notes (Signed)
Pharmacy: Vancomycin  Assessment: Pt is on Vancomycin 1gm IV Q24h. Vanco trough level is 12 mcg/ml (goal 15-20)  Plan: Will increase Vancomycin to 1250mg  IV Q24h. Will check Vanco trough level after the 4th dose.  Thanks, Dorethea ClanMary Ann Hawthorne Day, PharmD 11/30/2015

## 2015-11-30 NOTE — Interval H&P Note (Signed)
History and Physical Interval Note:  11/30/2015 11:48 AM  [name]  has presented today for surgery, with the diagnosis of Hematemesis  The various methods of treatment have been discussed with the patient and family. After consideration of risks, benefits and other options for treatment, the patient has consented to  Procedure(s): ESOPHAGOGASTRODUODENOSCOPY (EGD) (N/A) as a surgical intervention .  The patient's history has been reviewed, patient examined, no change in status, stable for surgery.  I have reviewed the patient's chart and labs.  Questions were answered to the patient's satisfaction.     Erial Fikes C.

## 2015-12-01 ENCOUNTER — Encounter (HOSPITAL_COMMUNITY): Payer: Self-pay | Admitting: Gastroenterology

## 2015-12-01 DIAGNOSIS — E119 Type 2 diabetes mellitus without complications: Secondary | ICD-10-CM

## 2015-12-01 LAB — CBC
HCT: 24.1 % — ABNORMAL LOW (ref 39.0–52.0)
HEMOGLOBIN: 8.2 g/dL — AB (ref 13.0–17.0)
MCH: 30.7 pg (ref 26.0–34.0)
MCHC: 34 g/dL (ref 30.0–36.0)
MCV: 90.3 fL (ref 78.0–100.0)
PLATELETS: 217 10*3/uL (ref 150–400)
RBC: 2.67 MIL/uL — ABNORMAL LOW (ref 4.22–5.81)
RDW: 17 % — AB (ref 11.5–15.5)
WBC: 9.2 10*3/uL (ref 4.0–10.5)

## 2015-12-01 LAB — GLUCOSE, CAPILLARY
GLUCOSE-CAPILLARY: 113 mg/dL — AB (ref 65–99)
GLUCOSE-CAPILLARY: 113 mg/dL — AB (ref 65–99)
GLUCOSE-CAPILLARY: 116 mg/dL — AB (ref 65–99)
GLUCOSE-CAPILLARY: 97 mg/dL (ref 65–99)
Glucose-Capillary: 102 mg/dL — ABNORMAL HIGH (ref 65–99)
Glucose-Capillary: 111 mg/dL — ABNORMAL HIGH (ref 65–99)

## 2015-12-01 LAB — BASIC METABOLIC PANEL
Anion gap: 3 — ABNORMAL LOW (ref 5–15)
BUN: 27 mg/dL — AB (ref 6–20)
CALCIUM: 8.2 mg/dL — AB (ref 8.9–10.3)
CO2: 25 mmol/L (ref 22–32)
CREATININE: 1.13 mg/dL (ref 0.61–1.24)
Chloride: 113 mmol/L — ABNORMAL HIGH (ref 101–111)
GFR, EST NON AFRICAN AMERICAN: 56 mL/min — AB (ref >60)
Glucose, Bld: 118 mg/dL — ABNORMAL HIGH (ref 65–99)
Potassium: 3.6 mmol/L (ref 3.5–5.1)
SODIUM: 141 mmol/L (ref 135–145)

## 2015-12-01 LAB — HEMOGLOBIN AND HEMATOCRIT, BLOOD
HCT: 24.3 % — ABNORMAL LOW (ref 39.0–52.0)
HEMOGLOBIN: 8.2 g/dL — AB (ref 13.0–17.0)

## 2015-12-01 MED ORDER — MISOPROSTOL 100 MCG PO TABS
100.0000 ug | ORAL_TABLET | Freq: Three times a day (TID) | ORAL | Status: DC
  Administered 2015-12-01 – 2015-12-04 (×13): 100 ug via ORAL
  Filled 2015-12-01 (×14): qty 1

## 2015-12-01 MED ORDER — PRAMIPEXOLE DIHYDROCHLORIDE 1 MG PO TABS
1.0000 mg | ORAL_TABLET | Freq: Four times a day (QID) | ORAL | Status: DC
  Administered 2015-12-01 – 2015-12-04 (×13): 1 mg via ORAL
  Filled 2015-12-01 (×15): qty 1

## 2015-12-01 MED ORDER — COLCHICINE 0.6 MG PO TABS
0.6000 mg | ORAL_TABLET | Freq: Every day | ORAL | Status: DC
  Administered 2015-12-01 – 2015-12-04 (×4): 0.6 mg via ORAL
  Filled 2015-12-01 (×4): qty 1

## 2015-12-01 MED ORDER — INSULIN ASPART 100 UNIT/ML ~~LOC~~ SOLN
0.0000 [IU] | Freq: Three times a day (TID) | SUBCUTANEOUS | Status: DC
Start: 2015-12-01 — End: 2015-12-04
  Administered 2015-12-03: 1 [IU] via SUBCUTANEOUS

## 2015-12-01 MED ORDER — SUCRALFATE 1 GM/10ML PO SUSP
1.0000 g | Freq: Three times a day (TID) | ORAL | Status: DC
  Administered 2015-12-01 – 2015-12-04 (×13): 1 g via ORAL
  Filled 2015-12-01 (×13): qty 10

## 2015-12-01 NOTE — Consult Note (Signed)
   [facility] CM Inpatient Consult   12/01/2015  [name] [birth year] [id number]   [facility]. Chart reviewed. Noted discharge plan is for SNF.  There are no current identifiable [facility] Care Management needs at this time. If patient's post hospital needs change, please place a [facility] Care Management consult. For questions please contact:  Raiford Nobletika Janaia Kozel, MSN-Ed, RN,BSN [facility] Hospital Liaison [phone]

## 2015-12-01 NOTE — Progress Notes (Signed)
[facility] PROGRESS NOTE Subjective No pain or gross bleeding EGD shows large MW tear vs esophageal ulcer.  Objective: Vital signs in last 24 hours: Temp:  [97.6 F (36.4 C)-98.3 F (36.8 C)] 97.6 F (36.4 C) (06/29 0343) Pulse Rate:  [74-84] 74 (06/29 0343) Resp:  [10-20] 16 (06/29 0343) BP: (90-134)/(42-57) 97/42 mmHg (06/29 0343) SpO2:  [94 %-100 %] 98 % (06/29 0343) Last BM Date: 11/30/15  Intake/Output from previous day: 06/28 0701 - 06/29 0700 In: 2442 [I.V.:1425; Blood:617; IV Piggyback:400] Out: 300 [Urine:300] Intake/Output this shift: Total I/O In: -  Out: 300 [Urine:300]  PE: General--on bedpan no pain  Abdomen--nontender  Lab Results:  Recent Labs  11/29/15 1000 11/29/15 1847 11/30/15 0011 11/30/15 0330 11/30/15 0817 12/01/15 0130 12/01/15 0445  WBC 8.8 11.1* 13.5*  --   --   --  9.2  HGB 8.8* 9.1* 8.4* 7.2* 7.1* 8.2* 8.2*  HCT 25.4* 27.2* 26.3* 21.9* 21.9* 24.3* 24.1*  PLT 222 266 308  --   --   --  217   BMET  Recent Labs  11/29/15 1000 11/29/15 1847 12/01/15 0445  NA 138 137 141  K 3.9 4.3 3.6  CL 103 102 113*  CO2 27 24 25   CREATININE 1.20 1.27* 1.13   LFT  Recent Labs  11/29/15 1000 11/29/15 1847  PROT 5.2* 5.6*  AST 13* 14*  ALT 8* 9*  ALKPHOS 147* 151*  BILITOT 0.9 0.6   PT/INR  Recent Labs  11/29/15 1847  LABPROT 15.7*  INR 1.23   PANCREAS No results for input(s): LIPASE in the last 72 hours.       Studies/Results: Dg Chest Port 1 View  11/30/2015  CLINICAL DATA:  GI bleed. EXAM: PORTABLE CHEST 1 VIEW COMPARISON:  November 08, 2015 FINDINGS: The right PICC line is stable. There is elevation of the right hemidiaphragm. Increased interstitial markings suggest mild edema. The heart, hila, mediastinum, lungs, and pleura are otherwise unchanged. IMPRESSION: Mild edema. Electronically Signed   By: Gerome Samavid  Williams III M.D   On: 11/30/2015 01:32    Medications: I have reviewed the patient's current  medications.  Assessment/Plan: 1. Hematemesis due to MW tear vs esoph ulcer. Will add carafate liquid and allow sips and popsicles and ? Advance further if tolerated.   Justyne Roell JR,Kristien Salatino L 12/01/2015, 8:35 AM  This note was created using voice recognition software. Minor errors may Have occurred unintentionally.  Pager: [phone] If no answer or after hours call [phone]

## 2015-12-01 NOTE — Evaluation (Signed)
Physical Therapy Evaluation Patient Details Name: [name] K Savell MRN: [id number] DOB: 11-Apr-1925 Today's Date: 12/01/2015   History of Present Illness  82 yo male admitted with UGIB. Hx of DM, Parkinson's, PVD, CAD, aortic stenosis, HTN. Recent L foot 1st ray amputation and I&D L heel ulcer 6/3, repeat I&D L heel ulcer 6/22.   Clinical Impression  On eval, pt required Mod assist +2 for bed mobility. Min guard assist for static sitting balance at EOB. Pt tolerated a few sitting LE exercises and reaching exercises. Fatigues fairly easily. Recommend return to SNF for continued rehab.     Follow Up Recommendations SNF    Equipment Recommendations  Hospital bed    Recommendations for Other Services       Precautions / Restrictions Precautions Precautions: Fall Restrictions Weight Bearing Restrictions: Yes LLE Weight Bearing: Non weight bearing Other Position/Activity Restrictions: L foot s/p surgery      Mobility  Bed Mobility Overal bed mobility: Needs Assistance Bed Mobility: Sidelying to Sit;Sit to Supine   Sidelying to sit: Mod assist;+2 for physical assistance;+2 for safety/equipment   Sit to supine: Mod assist;+2 for physical assistance;+2 for safety/equipment   General bed mobility comments: Assist for trunk and bil LEs. Increased time.   Transfers                 General transfer comment: NT this session  Ambulation/Gait                Stairs            Wheelchair Mobility    Modified Rankin (Stroke Patients Only)       Balance     Sitting balance-Leahy Scale: Fair Sitting balance - Comments: Sat EOB for at least 5 mintues. Pt can statically sit unsupported without UE support. However, requires UE support with dynamic sitting balance. Worked on reaching, bilaterally, to encourage anterior weight shifting.                                      Pertinent Vitals/Pain Pain Assessment: No/denies pain    Home Living  Family/patient expects to be discharged to:: Skilled nursing facility                      Prior Function Level of Independence: Needs assistance   Gait / Transfers Assistance Needed: Pt has most recently been performing slide board transfer due to decline in strength. Prior to that he could help with limited transfers. Pt has been using a wheelchair for a couple years for mobility  ADL's / Homemaking Assistance Needed: Requires assist with ADLs/IADLs        Hand Dominance        Extremity/Trunk Assessment   Upper Extremity Assessment: Generalized weakness           Lower Extremity Assessment: Generalized weakness RLE Deficits / Details: R knee flexion contracture lacking approximately 45 degrees of extension. Strength grossly 3-/5 LLE Deficits / Details: L knee flexion contracture lacking approximately 80-90 degrees of extension. Strength grossly 2+/5  Cervical / Trunk Assessment: Kyphotic  Communication   Communication: No difficulties  Cognition Arousal/Alertness: Awake/alert Behavior During Therapy: WFL for tasks assessed/performed Overall Cognitive Status: Within Functional Limits for tasks assessed                      General Comments  Exercises General Exercises - Lower Extremity Long Arc Quad: Right;10 reps;Seated Hip Flexion/Marching: AAROM;Both;10 reps;Seated      Assessment/Plan    PT Assessment Patient needs continued PT services  PT Diagnosis Generalized weakness   PT Problem List Decreased strength;Decreased activity tolerance;Decreased range of motion;Decreased balance;Decreased skin integrity;Decreased mobility;Decreased knowledge of use of DME  PT Treatment Interventions DME instruction;Functional mobility training;Therapeutic activities;Patient/family education;Therapeutic exercise;Balance training   PT Goals (Current goals can be found in the Care Plan section) Acute Rehab PT Goals Patient Stated Goal: Be able to perform  sliding board transfers at home. Return to rehab to get stronger and more mobile PT Goal Formulation: With patient/family Time For Goal Achievement: 12/15/15    Frequency Min 2X/week   Barriers to discharge        Co-evaluation               End of Session   Activity Tolerance: Patient tolerated treatment well Patient left: in bed;with call bell/phone within reach;with family/visitor present           Time: [id number] PT Time Calculation (min) (ACUTE ONLY): 15 min   Charges:   PT Evaluation $PT Eval Moderate Complexity: 1 Procedure     PT G Codes:        Rebeca AlertJannie Saylee Sherrill, MPT Pager: [phone]

## 2015-12-01 NOTE — NC FL2 (Signed)
[place] MEDICAID FL2 LEVEL OF CARE SCREENING TOOL     IDENTIFICATION  Patient Name: [name] Birthdate: 1924/12/19 Sex: male Admission Date (Current Location): 11/29/2015  [facility] [place]   Facility and Address:  [facility],  [address], [place] [id number]      Provider Number: [id number]  Attending Physician Name and Address:  Calvert CantorSaima Rizwan, MD  Relative Name and Phone Number:       Current Level of Care: Hospital Recommended Level of Care: Skilled Nursing Facility Prior Approval Number:    Date Approved/Denied:   PASRR Number: [id number]  Discharge Plan: SNF    Current Diagnoses: Patient Active Problem List   Diagnosis Date Noted  . Hematemesis 11/30/2015  . Upper GI bleed 11/30/2015  . Great toe amputation status (HCC), L foot , recent 11/30/2015  . Acute blood loss anemia 11/30/2015  . Ulcer of left heel (HCC), recent I&D 11/30/2015  . Non-insulin dependent type 2 diabetes mellitus (HCC) 11/30/2015  . PVD (peripheral vascular disease), [facility] 11/30/2015  . DNR (do not resuscitate) 11/30/2015  . Heel ulcer (HCC) 11/21/2015  . Diabetic foot (HCC) 11/04/2015  . PAD (peripheral artery disease) (HCC) 10/28/2015  . Diabetes mellitus without complication (HCC) 07/11/2015  . Clavicle enlargement 07/11/2015  . Poorly controlled type 2 diabetes mellitus (HCC) 06/23/2014  . Bed sore on heel, right, unstageable (HCC) 09/23/2013  . Coronary atherosclerosis of native coronary artery 03/27/2013  . Chronic diastolic CHF (congestive heart failure) (HCC) 03/27/2013  . Aortic valve stenosis 03/27/2013  . Essential hypertension 03/27/2013  . Hyperlipidemia 03/27/2013  . Atrial fibrillation (HCC) 03/27/2013  . Bilateral leg edema 03/27/2013  . Constipation 03/27/2013    Orientation RESPIRATION BLADDER Height & Weight     Self, Time, Situation, Place  Normal Continent Weight: 173 lb 11.6 oz  (78.8 kg) Height:  6' (182.9 cm)  BEHAVIORAL SYMPTOMS/MOOD NEUROLOGICAL BOWEL NUTRITION STATUS      Continent Diet (NPO currently - see d/c summary for diet upon discharge)  AMBULATORY STATUS COMMUNICATION OF NEEDS Skin   Extensive Assist Verbally  (PressureUlcer06/19/17Unstageable-Fullthicknesstissuelossinwhichthebaseoftheulceriscoveredbyslough(yellow,tan,gray,greenorbrown)and/oreschar(tan,brownorblack)inthewoundbed.blackeschartissuearoundbase(left heel))                       Personal Care Assistance Level of Assistance  Bathing, Feeding, Dressing Bathing Assistance: Limited assistance Feeding assistance: Independent Dressing Assistance: Limited assistance     Functional Limitations Info  Sight, Hearing, Speech Sight Info: Adequate Hearing Info: Adequate Speech Info: Adequate    SPECIAL CARE FACTORS FREQUENCY  PT (By licensed PT), OT (By licensed OT)     PT Frequency: 5 OT Frequency: 5            Contractures      Additional Factors Info  Code Status, Allergies Code Status Info: DNR Allergies Info: NKDA     Isolation Precautions Info: MRSA     Current Medications (12/01/2015):  This is the current hospital active medication list Current Facility-Administered Medications  Medication Dose Route Frequency Provider Last Rate Last Dose  . 0.9 %  sodium chloride infusion   Intravenous Continuous Delano Metzobert Schertz, MD 40 mL/hr at 11/30/15 1836    . amiodarone (PACERONE) tablet 100 mg  100 mg Oral Daily Calvert CantorSaima Rizwan, MD   100 mg at 12/01/15 0952  . carbidopa-levodopa (SINEMET IR) 25-100 MG per tablet immediate release 1 tablet  1 tablet Oral TID WC & HS Delano Metzobert Schertz, MD   1 tablet at 12/01/15  1242  . Chlorhexidine Gluconate Cloth 2 % PADS 6 each  6 each Topical Q0600 Delano Metzobert Schertz, MD   6 each at 11/30/15 [phone]  . colchicine tablet 0.6 mg  0.6 mg Oral Daily Calvert CantorSaima Rizwan, MD   0.6 mg at 12/01/15 0951  . insulin aspart (novoLOG)  injection 0-9 Units  0-9 Units Subcutaneous TID WC Calvert CantorSaima Rizwan, MD   0 Units at 12/01/15 0900  . levothyroxine (SYNTHROID, LEVOTHROID) tablet 150 mcg  150 mcg Oral QAC breakfast Delano Metzobert Schertz, MD   150 mcg at 12/01/15 [phone]  . misoprostol (CYTOTEC) tablet 100 mcg  100 mcg Oral TID WC & HS Calvert CantorSaima Rizwan, MD   100 mcg at 12/01/15 1242  . mupirocin ointment (BACTROBAN) 2 % 1 application  1 application Nasal BID Delano Metzobert Schertz, MD   1 application at 12/01/15 [phone]  . ondansetron (ZOFRAN) tablet 4 mg  4 mg Oral Q6H PRN Delano Metzobert Schertz, MD       Or  . ondansetron [facility](ZOFRAN) injection 4 mg  4 mg Intravenous Q6H PRN Delano Metzobert Schertz, MD      . pantoprazole (PROTONIX) 80 mg in sodium chloride 0.9 % 250 mL (0.32 mg/mL) infusion  8 mg/hr Intravenous Continuous Delano Metzobert Schertz, MD 25 mL/hr at 12/01/15 1058 8 mg/hr at 12/01/15 1058  . [START ON 12/03/2015] pantoprazole (PROTONIX) injection 40 mg  40 mg Intravenous Q12H Delano Metzobert Schertz, MD      . piperacillin-tazobactam (ZOSYN) IVPB 3.375 g  3.375 g Intravenous Q8H Delano Metzobert Schertz, MD   3.375 g at 12/01/15 1411  . pramipexole (MIRAPEX) tablet 1 mg  1 mg Oral QID Calvert CantorSaima Rizwan, MD   1 mg at 12/01/15 1411  . sodium chloride flush (NS) 0.9 % injection 10-40 mL  10-40 mL Intracatheter Q12H Delano Metzobert Schertz, MD   10 mL at 11/30/15 2252  . sodium chloride flush (NS) 0.9 % injection 10-40 mL  10-40 mL Intracatheter PRN Delano Metzobert Schertz, MD   10 mL at 12/01/15 0451  . sodium chloride flush (NS) 0.9 % injection 3 mL  3 mL Intravenous Q12H Delano Metzobert Schertz, MD   3 mL at 12/01/15 1000  . sucralfate (CARAFATE) 1 GM/10ML suspension 1 g  1 g Oral TID WC & HS Carman ChingJames Edwards, MD   1 g at 12/01/15 1242  . tamsulosin (FLOMAX) capsule 0.4 mg  0.4 mg Oral QPC supper Calvert CantorSaima Rizwan, MD   0.4 mg at 11/30/15 1758  . vancomycin (VANCOCIN) 1,250 mg in sodium chloride 0.9 % 250 mL IVPB  1,250 mg Intravenous Q24H Chales AbrahamsMary Ann IthacaFrens, RPH   1,250 mg at 11/30/15 2250     Discharge Medications: Please see  discharge summary for a list of discharge medications.  Relevant Imaging Results:  Relevant Lab Results:   Additional Information SSN: [id number]  [name], Chris Crotwell F, LCSW

## 2015-12-01 NOTE — Progress Notes (Signed)
PROGRESS NOTE    [name] K Branton  JXB:[id number] DOB: 12/29/1924 DOA: 11/29/2015  PCP: Tonette LedererKIMBERLY A STEGMAYER, PA-C   Brief Narrative:  82 y/o with PVD on Aspirin and Plavix, osteomyelitis of left heal (MRSA and Proteus) s/p left 1st ray amputation, venous insufficiency on Lasix, Parkinson's, A-fib (on Aspirin, Plavix and Amio), CAD, NIDDM 2, Ao Stenosis, HTN presents with hematemesis. He was at a SNF [facility].   Subjective: No complaints other than having many bowel movements through the night and this AM. No pain. No nausea.   Assessment & Plan:   Principal Problem:   Upper GI bleed - has undergone EGD showing a Mallory Weiss tear -  Protonix infusion- start clears today- follow H and H carefully  Active Problems:    Acute blood loss anemia - - Hb 7.1 - transfused 2 U PRBC on 6/28 - following Hb   -  Gangrene b/l heals - left 1st ray amputation for osteomyelitis - L heal wound grew out MRSA, enteroccus and the proteus. - on Vanc and Zosyn via PICC- 2 wk course from day of surgery - due to continue through 6/7- being managed by Dr Sampson GoonFitzgerald     Non-insulin dependent type 2 diabetes mellitus - Januvia on hold- cont SSI     PVD (peripheral vascular disease) - 6/22- recanalization left lower extremity with angioplasty of the posterior tibial artery for limb salvage  - 6/22 - Excisional debridement of necrotic eschar from posterior left heel ulceration - 6/23- recanalization right lower extremity and aspiration of thrombus from popliteal and post tibial arteries - Aspirin and Plavix on hold  CAD with Stent - ASA/ Plavix on hold  CKD 3 - Cr 1.27 > 1.13  Parkinson's disease - Sinemet  Gout - Colchicine on hold- resume today as renal function improved  A-fib - Amiodarone- no anticoagulation  BPH - Flomax-   Hypothyroid - Synthroid    DNR (do not resuscitate)   DVT prophylaxis: sCDs Code Status: DNR Family Communication:  daughter Disposition Plan: return to SNF in 2-3 days Consultants:   GI Procedures:   EGD Antimicrobials:  Anti-infectives    Start     Dose/Rate Route Frequency Ordered Stop   11/30/15 2300  vancomycin (VANCOCIN) 1,250 mg in sodium chloride 0.9 % 250 mL IVPB     1,250 mg 166.7 mL/hr over 90 Minutes Intravenous Every 24 hours 11/30/15 2225     11/30/15 0130  vancomycin (VANCOCIN) IVPB 1000 mg/200 mL premix  Status:  Discontinued     1,000 mg 200 mL/hr over 60 Minutes Intravenous Daily at bedtime 11/30/15 0047 11/30/15 2222   11/30/15 0100  piperacillin-tazobactam (ZOSYN) IVPB 3.375 g     3.375 g 12.5 mL/hr over 240 Minutes Intravenous Every 8 hours 11/30/15 0047         Objective: Filed Vitals:   11/30/15 1849 11/30/15 2051 12/01/15 0343 12/01/15 1406  BP: 124/57 110/55 97/42 98/42   Pulse: 84 80 74 74  Temp: 97.9 F (36.6 C) 97.6 F (36.4 C) 97.6 F (36.4 C) 97.9 F (36.6 C)  TempSrc: Axillary Axillary Axillary Oral  Resp: 18 16 16 18   Height:      Weight:      SpO2: 97% 98% 98% 96%    Intake/Output Summary (Last 24 hours) at 12/01/15 1521 Last data filed at 12/01/15 1300  Gross per 24 hour  Intake   2482 ml  Output    800 ml  Net  1682 ml   Filed Weights   11/29/15 2255  Weight: 78.8 kg (173 lb 11.6 oz)    Examination: General exam: Appears comfortable  HEENT: PERRLA, oral mucosa moist, no sclera icterus or thrush Respiratory system: Clear to auscultation. Respiratory effort normal. Cardiovascular system: S1 & S2 heard, RRR.  No murmurs  Gastrointestinal system: Abdomen soft, non-tender, nondistended. Normal bowel sound. No organomegaly Central nervous system: Alert and oriented. No focal neurological deficits. Extremities: No cyanosis, clubbing or edema Skin: dressing on both heals opened- good granulation tissue seen on both heels Psychiatry:  Mood & affect appropriate.     Data Reviewed: I have personally reviewed following labs and imaging  studies  CBC:  Recent Labs Lab 11/25/15 0500 11/29/15 1000 11/29/15 1847 11/30/15 0011 11/30/15 0330 11/30/15 0817 12/01/15 0130 12/01/15 0445  WBC 7.2 8.8 11.1* 13.5*  --   --   --  9.2  NEUTROABS  --  6.7* 8.2*  --   --   --   --   --   HGB 10.1* 8.8* 9.1* 8.4* 7.2* 7.1* 8.2* 8.2*  HCT 30.0* 25.4* 27.2* 26.3* 21.9* 21.9* 24.3* 24.1*  MCV 89.5 88.8 90.6 91.0  --   --   --  90.3  PLT 248 222 266 308  --   --   --  217   Basic Metabolic Panel:  Recent Labs Lab 11/25/15 0500 11/26/15 0600 11/28/15 0434 11/29/15 1000 11/29/15 1847 12/01/15 0445  NA 138 141  --  138 137 141  K 3.4* 3.7  --  3.9 4.3 3.6  CL 108 111  --  103 102 113*  CO2 26 25  --  GLUCOSE 118* 142*  --  158* 175* 118*  BUN 17 14  --  17 25* 27*  CREATININE 1.03 1.10 1.10 1.20 1.27* 1.13  CALCIUM 8.2* 8.4*  --  8.4* 8.7* 8.2*  MG 1.8  --   --   --   --   --    GFR: Estimated Creatinine Clearance: 49.6 mL/min (by C-G formula based on Cr of 1.13). Liver Function Tests:  Recent Labs Lab 11/29/15 1000 11/29/15 1847  AST 13* 14*  ALT 8* 9*  ALKPHOS 147* 151*  BILITOT 0.9 0.6  PROT 5.2* 5.6*  ALBUMIN 2.3* 2.6*   No results for input(s): LIPASE, AMYLASE in the last 168 hours. No results for input(s): AMMONIA in the last 168 hours. Coagulation Profile:  Recent Labs Lab 11/29/15 1847  INR 1.23   Cardiac Enzymes:  Recent Labs Lab 11/29/15 1847  TROPONINI <0.03   BNP (last 3 results) No results for input(s): PROBNP in the last 8760 hours. HbA1C: No results for input(s): HGBA1C in the last 72 hours. CBG:  Recent Labs Lab 11/30/15 2026 12/01/15 0043 12/01/15 0341 12/01/15 0737 12/01/15 1156  GLUCAP 129* 113* 102* 113* 111*   Lipid Profile: No results for input(s): CHOL, HDL, LDLCALC, TRIG, CHOLHDL, LDLDIRECT in the last 72 hours. Thyroid Function Tests: No results for input(s): TSH, T4TOTAL, FREET4, T3FREE, THYROIDAB in the last 72 hours. Anemia Panel:  Recent  Labs  11/30/15 0011  TIBC 230*  IRON 49   Urine analysis:    Component Value Date/Time   COLORURINE YELLOW* 11/29/2015 2116   APPEARANCEUR CLEAR* 11/29/2015 2116   APPEARANCEUR Cloudy* 09/27/2015 1506   LABSPEC 1.025 11/29/2015 2116   PHURINE 5.0 11/29/2015 2116   GLUCOSEU NEGATIVE 11/29/2015 2116   HGBUR NEGATIVE 11/29/2015 2116   BILIRUBINUR  NEGATIVE 11/29/2015 2116   BILIRUBINUR Negative 09/27/2015 1506   KETONESUR TRACE* 11/29/2015 2116   PROTEINUR NEGATIVE 11/29/2015 2116   PROTEINUR 1+* 09/27/2015 1506   NITRITE NEGATIVE 11/29/2015 2116   NITRITE Positive* 09/27/2015 1506   LEUKOCYTESUR NEGATIVE 11/29/2015 2116   LEUKOCYTESUR 2+* 09/27/2015 1506   Sepsis Labs: @LABRCNTIP (procalcitonin:4,lacticidven:4) ) No results found for this or any previous visit (from the past 240 hour(s)).       Radiology Studies: Dg Chest Port 1 View  11/30/2015  CLINICAL DATA:  GI bleed. EXAM: PORTABLE CHEST 1 VIEW COMPARISON:  November 08, 2015 FINDINGS: The right PICC line is stable. There is elevation of the right hemidiaphragm. Increased interstitial markings suggest mild edema. The heart, hila, mediastinum, lungs, and pleura are otherwise unchanged. IMPRESSION: Mild edema. Electronically Signed   By: Gerome Samavid  Williams III M.D   On: 11/30/2015 01:32      Scheduled Meds: . amiodarone  100 mg Oral Daily  . carbidopa-levodopa  1 tablet Oral TID WC & HS  . Chlorhexidine Gluconate Cloth  6 each Topical Q0600  . colchicine  0.6 mg Oral Daily  . insulin aspart  0-9 Units Subcutaneous TID WC  . levothyroxine  150 mcg Oral QAC breakfast  . misoprostol  100 mcg Oral TID WC & HS  . mupirocin ointment  1 application Nasal BID  . [START ON 12/03/2015] pantoprazole  40 mg Intravenous Q12H  . piperacillin-tazobactam  3.375 g Intravenous Q8H  . pramipexole  1 mg Oral QID  . sodium chloride flush  10-40 mL Intracatheter Q12H  . sodium chloride flush  3 mL Intravenous Q12H  . sucralfate  1 g Oral TID WC  & HS  . tamsulosin  0.4 mg Oral QPC supper  . vancomycin  1,250 mg Intravenous Q24H   Continuous Infusions: . sodium chloride 40 mL/hr at 11/30/15 1836  . pantoprozole (PROTONIX) infusion 8 mg/hr (12/01/15 1058)     LOS: 2 days    Time spent in minutes: 35    Taos Tapp, MD [facility] Pager: [email or web address] Password [email or web address] 12/01/2015, 3:21 PM

## 2015-12-01 NOTE — Progress Notes (Signed)
CSW received consult that [facility]. CSW confirmed with patient & daughter, Dewayne Hatchnn at bedside that they plan for him to return when discharged.    Lincoln MaxinKelly Jemarion Roycroft, LCSW [facility] Clinical Social Worker cell #: [phone]

## 2015-12-02 LAB — CBC
HEMATOCRIT: 23.1 % — AB (ref 39.0–52.0)
HEMATOCRIT: 25.8 % — AB (ref 39.0–52.0)
HEMOGLOBIN: 8.7 g/dL — AB (ref 13.0–17.0)
Hemoglobin: 7.6 g/dL — ABNORMAL LOW (ref 13.0–17.0)
MCH: 30 pg (ref 26.0–34.0)
MCH: 29.8 pg (ref 26.0–34.0)
MCHC: 32.9 g/dL (ref 30.0–36.0)
MCHC: 33.7 g/dL (ref 30.0–36.0)
MCV: 91.3 fL (ref 78.0–100.0)
MCV: 88.4 fL (ref 78.0–100.0)
PLATELETS: 200 10*3/uL (ref 150–400)
Platelets: 205 10*3/uL (ref 150–400)
RBC: 2.53 MIL/uL — AB (ref 4.22–5.81)
RBC: 2.92 MIL/uL — AB (ref 4.22–5.81)
RDW: 17.3 % — ABNORMAL HIGH (ref 11.5–15.5)
RDW: 16.4 % — ABNORMAL HIGH (ref 11.5–15.5)
WBC: 6.9 10*3/uL (ref 4.0–10.5)
WBC: 6.3 10*3/uL (ref 4.0–10.5)

## 2015-12-02 LAB — PREPARE RBC (CROSSMATCH)

## 2015-12-02 LAB — GLUCOSE, CAPILLARY
GLUCOSE-CAPILLARY: 111 mg/dL — AB (ref 65–99)
GLUCOSE-CAPILLARY: 86 mg/dL (ref 65–99)
Glucose-Capillary: 87 mg/dL (ref 65–99)
Glucose-Capillary: 119 mg/dL — ABNORMAL HIGH (ref 65–99)

## 2015-12-02 MED ORDER — SODIUM CHLORIDE 0.9 % IV SOLN
Freq: Once | INTRAVENOUS | Status: AC
  Administered 2015-12-02: 10:00:00 via INTRAVENOUS

## 2015-12-02 MED ORDER — PANTOPRAZOLE SODIUM 40 MG PO TBEC
40.0000 mg | DELAYED_RELEASE_TABLET | Freq: Two times a day (BID) | ORAL | Status: DC
  Administered 2015-12-02 – 2015-12-04 (×5): 40 mg via ORAL
  Filled 2015-12-02 (×4): qty 1

## 2015-12-02 NOTE — Plan of Care (Signed)
Problem: Skin Integrity: Goal: Risk for impaired skin integrity will decrease Outcome: Completed/Met Date Met:  12/02/15 Patient is being turned q 2 hours, barrier cream applied

## 2015-12-02 NOTE — Care Management Important Message (Signed)
Important Message  Patient Details IM Letter given to Cookie/Case Manager to present to Patient Name: [name] MRN: [id number] Date of Birth: [birth year]   Medicare Important Message Given:  Yes    Haskell FlirtJamison, Shakila Mak 12/02/2015, 11:00 AMImportant Message  Patient Details  Name: [name] MRN: [id number] Date of Birth: [birth year]   Medicare Important Message Given:  Yes    Haskell FlirtJamison, Burle Kwan 12/02/2015, 11:00 AM

## 2015-12-02 NOTE — Progress Notes (Signed)
HGB 8.7, provider paged with updated result

## 2015-12-02 NOTE — Progress Notes (Signed)
Pharmacy Antibiotic Note  [name] is a 82 y.o. male admitted on 11/29/2015 with chronic osteomyelitis.  Pharmacy has been consulted for Vanc/Zosyn dosing. 82 [facility]. Was just discharged from [place] on 6/26 had left foot ulcer that resulted in toe amputation. Would cultures grew MRSA and proteus and patient was discharged to SNF on vanc/zosyn with plans to complete abx's on 7/7 per ID (14 days after last surgery).  MD continued vanc/Zosyn on [facility].   - At [facility] 6/19-6/26 readmission for debridement worsening of osteo received Zosyn and 1g vanc 6/19 then random vanc level was 30, started on vanc 1g q12 thereafter and trough was 27 but drawn after dose started, trough next AM was 37 so vanc was stopped, trough following day was 17 so vanc was restarted 1g q24 and was discharged on this dose with plan to cont till 7/7 - [place] 6/2-6/7 received Vanc and Zosyn. Trough of 24 on 6/6 and Vanc changed to 750mg  IV q12 and discharged on these. S/p surgical amputation first ray on 6/3, culture grew MRSA, enterococcus and proteus   Plan:  Continue Vancomycin 1250mg  IV q24h Zosyn 3.375gm IV Q8h to be infused over 4hrs Weekly Vancomycin trough due Monday Monitor renal function  Height: 6' (182.9 cm) Weight: 173 lb 11.6 oz (78.8 kg) IBW/kg (Calculated) : 77.6  Temp (24hrs), Avg:97.9 F (36.6 C), Min:97.7 F (36.5 C), Max:98 F (36.7 C)   Recent Labs Lab 11/26/15 0600 11/28/15 0434 11/29/15 1000 11/29/15 1847 11/30/15 0011 11/30/15 2117 12/01/15 0445 12/02/15 0440  WBC  --   --  8.8 11.1* 13.5*  --  9.2 6.9  CREATININE 1.10 1.10 1.20 1.27*  --   --  1.13  --   VANCOTROUGH  --   --   --   --   --  12*  --   --     Estimated Creatinine Clearance: 49.6 mL/min (by C-G formula based on Cr of 1.13).    No Known Allergies  Antimicrobials this admission: 6/2 Vanc >> (7/7) 6/2 Zosyn >> (7/7)  Microbiology results: 5/30 L heel wound: enterococcus -  pansensitive, MRSA - sensitive to vanc, Bactrim, TCN, rifampin, and clindamycin and proteus - pansensitive at that time 6/3 wound: proteus - now resistant to amp, Unasyn, Ancef, cefepime, Rocephin and sensitive to cipro, gent, Primaxin, Zosyn and Bactrim and MRSA - same as above   Thank you for allowing pharmacy to be a part of this patient's care.   Junita PushMichelle Arsenio Schnorr, PharmD, BCPS Pager: [phone] 12/02/2015 8:06 AM

## 2015-12-02 NOTE — Progress Notes (Signed)
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 He was at a SNF after [facility].   Subjective: No complaints today. Wants a steak. No bloody BMs  Assessment & Plan:   Principal Problem:   Upper GI bleed - has undergone EGD showing a Mallory Weiss tear -  Protonix BID - full liquids today per GI- back to SNF tomorrow if no further bleeding  Active Problems:    Acute blood loss anemia - -6/28 Hb 7.1 - transfused 2 U PRBC on 6/28 - Hb back down to 7.6 today- recheck today and transfuse if low    -  Gangrene b/l heals - left 1st ray amputation for osteomyelitis - L heal wound grew out MRSA, enteroccus and the proteus. - on Vanc and Zosyn via PICC- 2 wk course from day of surgery - due to continue through 6/7- being managed by Dr Sampson GoonFitzgerald     Non-insulin dependent type 2 diabetes mellitus - Januvia on hold- cont SSI     PVD (peripheral vascular disease) - 6/22- recanalization left lower extremity with angioplasty of the posterior tibial artery for limb salvage  - 6/22 - Excisional debridement of necrotic eschar from posterior left heel ulceration - 6/23- recanalization right lower extremity and aspiration of thrombus from popliteal and post tibial arteries - Aspirin and Plavix on hold for now  CAD with Stent - ASA/ Plavix on hold  CKD 3 - Cr 1.27 > 1.13  Parkinson's disease - Sinemet  Gout - Colchicine   A-fib - Amiodarone- not on anticoagulation other than ASA and Plavix  BPH - Flomax-   Hypothyroid - Synthroid    DNR (do not resuscitate)   DVT prophylaxis: SCDs Code Status: DNR Family  Communication: daughter Disposition Plan: return to SNF hopefully tomorrow Consultants:   GI Procedures:   EGD Antimicrobials:  Anti-infectives    Start     Dose/Rate Route Frequency Ordered Stop   11/30/15 2300  vancomycin (VANCOCIN) 1,250 mg in sodium chloride 0.9 % 250 mL IVPB     1,250 mg 166.7 mL/hr over 90 Minutes Intravenous Every 24 hours 11/30/15 2225     11/30/15 0130  vancomycin (VANCOCIN) IVPB 1000 mg/200 mL premix  Status:  Discontinued     1,000 mg 200 mL/hr over 60 Minutes Intravenous Daily at bedtime 11/30/15 0047 11/30/15 2222   11/30/15 0100  piperacillin-tazobactam (ZOSYN) IVPB 3.375 g     3.375 g 12.5 mL/hr over 240 Minutes Intravenous Every 8 hours 11/30/15 0047         Objective: Filed Vitals:   12/01/15 2158 12/02/15 0628 12/02/15 1240 12/02/15 1310  BP: 96/44 125/65 102/54 99/48  Pulse: 69 65 68 62  Temp: 98 F (36.7 C) 97.7 F (36.5 C) 98 F (36.7 C) 97.5 F (36.4 C)  TempSrc: Oral Oral Oral Oral  Resp: 16 18 18 18   Height:      Weight:      SpO2: 94% 93% 98% 97%    Intake/Output Summary (Last 24 hours) at 12/02/15 1349 Last data filed at 12/02/15 [id number]  Gross per 24 hour  Intake   2037 ml  Output  500 ml  Net   1537 ml   Filed Weights   11/29/15 2255  Weight: 78.8 kg (173 lb 11.6 oz)    Examination: General exam: Appears comfortable  HEENT: PERRLA, oral mucosa moist, no sclera icterus or thrush Respiratory system: Clear to auscultation. Respiratory effort normal. Cardiovascular system: S1 & S2 heard, RRR.  No murmurs  Gastrointestinal system: Abdomen soft, non-tender, nondistended. Normal bowel sound. No organomegaly Central nervous system: Alert and oriented. No focal neurological deficits. Extremities: No cyanosis, clubbing or edema Skin: dressing on both heals opened- good granulation tissue seen on both heels Psychiatry:  Mood & affect appropriate.     Data Reviewed: I have personally reviewed following labs and imaging  studies  CBC:  Recent Labs Lab 11/29/15 1000 11/29/15 1847 11/30/15 0011 11/30/15 0330 11/30/15 0817 12/01/15 0130 12/01/15 0445 12/02/15 0440  WBC 8.8 11.1* 13.5*  --   --   --  9.2 6.9  NEUTROABS 6.7* 8.2*  --   --   --   --   --   --   HGB 8.8* 9.1* 8.4* 7.2* 7.1* 8.2* 8.2* 7.6*  HCT 25.4* 27.2* 26.3* 21.9* 21.9* 24.3* 24.1* 23.1*  MCV 88.8 90.6 91.0  --   --   --  90.3 91.3  PLT 222 266 308  --   --   --  217 200   Basic Metabolic Panel:  Recent Labs Lab 11/26/15 0600 11/28/15 0434 11/29/15 1000 11/29/15 1847 12/01/15 0445  NA 141  --  138 137 141  K 3.7  --  3.9 4.3 3.6  CL 111  --  103 102 113*  CO2 25  --  27 24 25   GLUCOSE 142*  --  158* 175* 118*  BUN 14  --  17 25* 27*  CREATININE 1.10 1.10 1.20 1.27* 1.13  CALCIUM 8.4*  --  8.4* 8.7* 8.2*   GFR: Estimated Creatinine Clearance: 49.6 mL/min (by C-G formula based on Cr of 1.13). Liver Function Tests:  Recent Labs Lab 11/29/15 1000 11/29/15 1847  AST 13* 14*  ALT 8* 9*  ALKPHOS 147* 151*  BILITOT 0.9 0.6  PROT 5.2* 5.6*  ALBUMIN 2.3* 2.6*   No results for input(s): LIPASE, AMYLASE in the last 168 hours. No results for input(s): AMMONIA in the last 168 hours. Coagulation Profile:  Recent Labs Lab 11/29/15 1847  INR 1.23   Cardiac Enzymes:  Recent Labs Lab 11/29/15 1847  TROPONINI <0.03   BNP (last 3 results) No results for input(s): PROBNP in the last 8760 hours. HbA1C: No results for input(s): HGBA1C in the last 72 hours. CBG:  Recent Labs Lab 12/01/15 1156 12/01/15 1757 12/01/15 2155 12/02/15 0808 12/02/15 1239  GLUCAP 111* 116* 97 87 111*   Lipid Profile: No results for input(s): CHOL, HDL, LDLCALC, TRIG, CHOLHDL, LDLDIRECT in the last 72 hours. Thyroid Function Tests: No results for input(s): TSH, T4TOTAL, FREET4, T3FREE, THYROIDAB in the last 72 hours. Anemia Panel:  Recent Labs  11/30/15 0011  TIBC 230*  IRON 49   Urine analysis:    Component Value  Date/Time   COLORURINE YELLOW* 11/29/2015 2116   APPEARANCEUR CLEAR* 11/29/2015 2116   APPEARANCEUR Cloudy* 09/27/2015 1506   LABSPEC 1.025 11/29/2015 2116   PHURINE 5.0 11/29/2015 2116   GLUCOSEU NEGATIVE 11/29/2015 2116   HGBUR NEGATIVE 11/29/2015 2116   BILIRUBINUR NEGATIVE 11/29/2015 2116   BILIRUBINUR Negative 09/27/2015 1506   KETONESUR TRACE* 11/29/2015 2116   PROTEINUR NEGATIVE 11/29/2015 2116  PROTEINUR 1+* 09/27/2015 1506   NITRITE NEGATIVE 11/29/2015 2116   NITRITE Positive* 09/27/2015 1506   LEUKOCYTESUR NEGATIVE 11/29/2015 2116   LEUKOCYTESUR 2+* 09/27/2015 1506   Sepsis Labs: (procalcitonin:4,lacticidven:4) ) No results found for this or any previous visit (from the past 240 hour(s)).       Radiology Studies: No results found.    Scheduled Meds: . amiodarone  100 mg Oral Daily  . carbidopa-levodopa  1 tablet Oral TID WC & HS  . Chlorhexidine Gluconate Cloth  6 each Topical Q0600  . colchicine  0.6 mg Oral Daily  . insulin aspart  0-9 Units Subcutaneous TID WC  . levothyroxine  150 mcg Oral QAC breakfast  . misoprostol  100 mcg Oral TID WC & HS  . mupirocin ointment  1 application Nasal BID  . pantoprazole  40 mg Oral BID  . piperacillin-tazobactam  3.375 g Intravenous Q8H  . pramipexole  1 mg Oral QID  . sodium chloride flush  10-40 mL Intracatheter Q12H  . sodium chloride flush  3 mL Intravenous Q12H  . sucralfate  1 g Oral TID WC & HS  . tamsulosin  0.4 mg Oral QPC supper  . vancomycin  1,250 mg Intravenous Q24H   Continuous Infusions: . sodium chloride 40 mL/hr at 12/01/15 1823     LOS: 3 days    Time spent in minutes: 35    Tallie Hevia, MD [facility] Pager: [email or web address] Password [email or web address] 12/02/2015, 1:49 PM

## 2015-12-02 NOTE — Progress Notes (Signed)
CSW confirmed with Cala BradfordKimberly at [facility] that they would be able to take patient over the weekend if ready.   Please call weekend CSW (ph#: [phone]) to facilitate discharge.      Lincoln MaxinKelly Araya Roel, LCSW [facility] Clinical Social Worker cell #: [phone]

## 2015-12-02 NOTE — Progress Notes (Signed)
Patient ID: [name], male   DOB: February 09, 1925, 82 y.o.   MRN: [id number] [facility] Progress [facility]  [name] 82 y.o. February 09, 1925   Subjective: Feels ok. Tolerating sips of clears. Denies abdominal pain, nausea, or vomiting.  Objective: Vital signs in last 24 hours: Filed Vitals:   12/01/15 2158 12/02/15 0628  BP: 96/44 125/65  Pulse: 69 65  Temp: 98 F (36.7 C) 97.7 F (36.5 C)  Resp: 16 18    Physical Exam: Gen: lethargic, thin, elderly, frail HEENT: anicteric sclera CV: RRR Chest: CTA B Abd: soft, nontender, nondistended, +BS  Lab Results:  Recent Labs  11/29/15 1847 12/01/15 0445  NA 137 141  K 4.3 3.6  CL 102 113*  CO2 24 25  GLUCOSE 175* 118*  BUN 25* 27*  CREATININE 1.27* 1.13  CALCIUM 8.7* 8.2*    Recent Labs  11/29/15 1847  AST 14*  ALT 9*  ALKPHOS 151*  BILITOT 0.6  PROT 5.6*  ALBUMIN 2.6*    Recent Labs  11/29/15 1847  12/01/15 0445 12/02/15 0440  WBC 11.1*  < > 9.2 6.9  NEUTROABS 8.2*  --   --   --   HGB 9.1*  < > 8.2* 7.6*  HCT 27.2*  < > 24.1* 23.1*  MCV 90.6  < > 90.3 91.3  PLT 266  < > 217 200  < > = values in this interval not displayed.  Recent Labs  11/29/15 1847  LABPROT 15.7*  INR 1.23      Assessment/Plan: S/P GI bleed due to Mallory Weiss tear - no further bleeding. Hgb 7.6 (8.2). Change to Protonix 40 mg PO BID. Change diet to full liquids and advance as tolerated. If stable ok to d/c tomorrow and d/w Dr. Butler [name]. Will sign off. Call if questions. No F/U with GI from this admission needed.  [name] C. 12/02/2015, 10:19 AM  Pager [phone]  If no answer or after 5 PM call [phone]

## 2015-12-03 ENCOUNTER — Encounter
Admission: RE | Admit: 2015-12-03 | Discharge: 2015-12-03 | Disposition: A | Discharge status: Home / Self Care | Payer: PPO | Source: Ambulatory Visit | Attending: Internal Medicine | Admitting: Internal Medicine

## 2015-12-03 DIAGNOSIS — E876 Hypokalemia: Secondary | ICD-10-CM | POA: Insufficient documentation

## 2015-12-03 LAB — GLUCOSE, CAPILLARY
GLUCOSE-CAPILLARY: 136 mg/dL — AB (ref 65–99)
Glucose-Capillary: 89 mg/dL (ref 65–99)
Glucose-Capillary: 106 mg/dL — ABNORMAL HIGH (ref 65–99)
Glucose-Capillary: 120 mg/dL — ABNORMAL HIGH (ref 65–99)

## 2015-12-03 LAB — CBC
HCT: 27.6 % — ABNORMAL LOW (ref 39.0–52.0)
Hemoglobin: 9.2 g/dL — ABNORMAL LOW (ref 13.0–17.0)
MCH: 30.3 pg (ref 26.0–34.0)
MCHC: 33.3 g/dL (ref 30.0–36.0)
MCV: 90.8 fL (ref 78.0–100.0)
PLATELETS: 212 10*3/uL (ref 150–400)
RBC: 3.04 MIL/uL — AB (ref 4.22–5.81)
RDW: 17 % — AB (ref 11.5–15.5)
WBC: 6.7 10*3/uL (ref 4.0–10.5)

## 2015-12-03 NOTE — Progress Notes (Signed)
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 He was at a SNF after recent discharge from [facility] on 6/26.   Subjective: Watery stools. No abdominal pain or nausea. Asking what he can eat. Spoke with patient and son in detail and answered all questions.   Assessment & Plan:   Principal Problem:   Upper GI bleed - has undergone EGD showing a Mallory Weiss tear - stool is now brown but is loose -  Protonix BID - advance to low residue today - back to SNF tomorrow if no further bleeding  Active Problems:    Acute blood loss anemia - -6/28 Hb 7.1 - transfused 2 U PRBC on 6/28 - Hb back down to 7.6 on 6/30- transfused 1 more Unit- Hb 9.2 today   -  Gangrene b/l heals - left 1st ray amputation for osteomyelitis - L heal wound grew out MRSA, enteroccus and the proteus. - on Vanc and Zosyn via PICC- 2 wk course from day of surgery - due to continue through 6/7- being managed by Dr Sampson Goon     Non-insulin dependent type 2 diabetes mellitus - Januvia on hold- cont SSI     PVD (peripheral vascular disease) - 6/22- recanalization left lower extremity with angioplasty of the posterior tibial artery for limb salvage  - 6/22 - Excisional debridement of necrotic eschar from posterior left heel ulceration - 6/23- recanalization right lower extremity and aspiration of thrombus from popliteal and post tibial arteries - Aspirin and Plavix on hold for now  CAD with Stent - ASA/ Plavix on hold  CKD 3 - Cr 1.27 > 1.13  Parkinson's disease - Sinemet  Gout - Colchicine   A-fib - Amiodarone- not on anticoagulation other than ASA and Plavix  BPH - Flomax-    Hypothyroid - Synthroid    DNR (do not resuscitate)   DVT prophylaxis: SCDs Code Status: DNR Family Communication: daughter/ son  Disposition Plan: return to SNF hopefully tomorrow Consultants:   GI Procedures:   EGD Antimicrobials:  Anti-infectives    Start     Dose/Rate Route Frequency Ordered Stop   11/30/15 2300  vancomycin (VANCOCIN) 1,250 mg in sodium chloride 0.9 % 250 mL IVPB     1,250 mg 166.7 mL/hr over 90 Minutes Intravenous Every 24 hours 11/30/15 2225     11/30/15 0130  vancomycin (VANCOCIN) IVPB 1000 mg/200 mL premix  Status:  Discontinued     1,000 mg 200 mL/hr over 60 Minutes Intravenous Daily at bedtime 11/30/15 0047 11/30/15 2222   11/30/15 0100  piperacillin-tazobactam (ZOSYN) IVPB 3.375 g     3.375 g 12.5 mL/hr over 240 Minutes Intravenous Every 8 hours 11/30/15 0047         Objective: Filed Vitals:   12/02/15 1600 12/02/15 1745 12/02/15 2227 12/03/15 0625  BP: 127/61 136/86 127/71 159/92  Pulse: 58 60 61 61  Temp: 97.8 F (36.6 C)  98 F (36.7 C) 97.4 F (36.3 C)  TempSrc: Oral  Oral Oral  Resp: Height:      Weight:      SpO2: 97%  96% 95%    Intake/Output Summary (Last 24  hours) at 12/03/15 1144 Last data filed at 12/03/15 1115  Gross per 24 hour  Intake    720 ml  Output    400 ml  Net    320 ml   Filed Weights   11/29/15 2255  Weight: 78.8 kg (173 lb 11.6 oz)    Examination: General exam: Appears comfortable  HEENT: PERRLA, oral mucosa moist, no sclera icterus or thrush Respiratory system: Clear to auscultation. Respiratory effort normal. Cardiovascular system: S1 & S2 heard, RRR.  No murmurs  Gastrointestinal system: Abdomen soft, non-tender, nondistended. Normal bowel sound. No organomegaly Central nervous system: Alert and oriented. No focal neurological deficits. Extremities: No cyanosis, clubbing or edema Skin: dressing on both heals opened- good granulation tissue seen on both heels Psychiatry:  Mood &  affect appropriate.     Data Reviewed: I have personally reviewed following labs and imaging studies  CBC:  Recent Labs Lab 11/29/15 1000 11/29/15 1847 11/30/15 0011  12/01/15 0130 12/01/15 0445 12/02/15 0440 12/02/15 1705 12/03/15 0430  WBC 8.8 11.1* 13.5*  --   --  9.2 6.9 6.3 6.7  NEUTROABS 6.7* 8.2*  --   --   --   --   --   --   --   HGB 8.8* 9.1* 8.4*  < > 8.2* 8.2* 7.6* 8.7* 9.2*  HCT 25.4* 27.2* 26.3*  < > 24.3* 24.1* 23.1* 25.8* 27.6*  MCV 88.8 90.6 91.0  --   --  90.3 91.3 88.4 90.8  PLT 222 266 308  --   --  217 200 205 212  < > = values in this interval not displayed. Basic Metabolic Panel:  Recent Labs Lab 11/28/15 0434 11/29/15 1000 11/29/15 1847 12/01/15 0445  NA  --  138 137 141  K  --  3.9 4.3 3.6  CL  --  103 102 113*  CO2  --  27 24 25   GLUCOSE  --  158* 175* 118*  BUN  --  17 25* 27*  CREATININE 1.10 1.20 1.27* 1.13  CALCIUM  --  8.4* 8.7* 8.2*   GFR: Estimated Creatinine Clearance: 49.6 mL/min (by C-G formula based on Cr of 1.13). Liver Function Tests:  Recent Labs Lab 11/29/15 1000 11/29/15 1847  AST 13* 14*  ALT 8* 9*  ALKPHOS 147* 151*  BILITOT 0.9 0.6  PROT 5.2* 5.6*  ALBUMIN 2.3* 2.6*   No results for input(s): LIPASE, AMYLASE in the last 168 hours. No results for input(s): AMMONIA in the last 168 hours. Coagulation Profile:  Recent Labs Lab 11/29/15 1847  INR 1.23   Cardiac Enzymes:  Recent Labs Lab 11/29/15 1847  TROPONINI <0.03   BNP (last 3 results) No results for input(s): PROBNP in the last 8760 hours. HbA1C: No results for input(s): HGBA1C in the last 72 hours. CBG:  Recent Labs Lab 12/02/15 0808 12/02/15 1239 12/02/15 1755 12/02/15 2224 12/03/15 0828  GLUCAP 87 111* 86 119* 89   Lipid Profile: No results for input(s): CHOL, HDL, LDLCALC, TRIG, CHOLHDL, LDLDIRECT in the last 72 hours. Thyroid Function Tests: No results for input(s): TSH, T4TOTAL, FREET4, T3FREE, THYROIDAB in the last 72  hours. Anemia Panel: No results for input(s): VITAMINB12, FOLATE, FERRITIN, TIBC, IRON, RETICCTPCT in the last 72 hours. Urine analysis:    Component Value Date/Time   COLORURINE YELLOW* 11/29/2015 2116   APPEARANCEUR CLEAR* 11/29/2015 2116   APPEARANCEUR Cloudy* 09/27/2015 1506   LABSPEC 1.025 11/29/2015 2116   PHURINE 5.0 11/29/2015 2116   GLUCOSEU  NEGATIVE 11/29/2015 2116   HGBUR NEGATIVE 11/29/2015 2116   BILIRUBINUR NEGATIVE 11/29/2015 2116   BILIRUBINUR Negative 09/27/2015 1506   KETONESUR TRACE* 11/29/2015 2116   PROTEINUR NEGATIVE 11/29/2015 2116   PROTEINUR 1+* 09/27/2015 1506   NITRITE NEGATIVE 11/29/2015 2116   NITRITE Positive* 09/27/2015 1506   LEUKOCYTESUR NEGATIVE 11/29/2015 2116   LEUKOCYTESUR 2+* 09/27/2015 1506   Sepsis Labs: @LABRCNTIP (procalcitonin:4,lacticidven:4) ) No results found for this or any previous visit (from the past 240 hour(s)).       Radiology Studies: No results found.    Scheduled Meds: . amiodarone  100 mg Oral Daily  . carbidopa-levodopa  1 tablet Oral TID WC & HS  . Chlorhexidine Gluconate Cloth  6 each Topical Q0600  . colchicine  0.6 mg Oral Daily  . insulin aspart  0-9 Units Subcutaneous TID WC  . levothyroxine  150 mcg Oral QAC breakfast  . misoprostol  100 mcg Oral TID WC & HS  . mupirocin ointment  1 application Nasal BID  . pantoprazole  40 mg Oral BID  . piperacillin-tazobactam  3.375 g Intravenous Q8H  . pramipexole  1 mg Oral QID  . sodium chloride flush  10-40 mL Intracatheter Q12H  . sodium chloride flush  3 mL Intravenous Q12H  . sucralfate  1 g Oral TID WC & HS  . tamsulosin  0.4 mg Oral QPC supper  . vancomycin  1,250 mg Intravenous Q24H   Continuous Infusions:     LOS: 4 days    Time spent in minutes: 35    Prescious Hurless, MD [facility] Pager: [email or web address] Password [email or web address] 12/03/2015, 11:44 AM

## 2015-12-03 NOTE — Progress Notes (Signed)
Patient discussed with Dr. Butler Denmarkizwan. Not stable today for d/c- hopefully tomorrow[facility].  Updated pt's daughter Dewayne Hatchnn.  Message left for SNF to return call re: above.  Will notify Sunday SW coverage of above.  Lorri Frederickonna T. Jaci LazierCrowder, [place]CS[place] [phone]

## 2015-12-04 DIAGNOSIS — L97401 Non-pressure chronic ulcer of unspecified heel and midfoot limited to breakdown of skin: Secondary | ICD-10-CM

## 2015-12-04 DIAGNOSIS — E876 Hypokalemia: Secondary | ICD-10-CM | POA: Diagnosis not present

## 2015-12-04 DIAGNOSIS — K226 Gastro-esophageal laceration-hemorrhage syndrome: Secondary | ICD-10-CM

## 2015-12-04 DIAGNOSIS — Z66 Do not resuscitate: Secondary | ICD-10-CM

## 2015-12-04 DIAGNOSIS — I4891 Unspecified atrial fibrillation: Secondary | ICD-10-CM

## 2015-12-04 DIAGNOSIS — E118 Type 2 diabetes mellitus with unspecified complications: Secondary | ICD-10-CM

## 2015-12-04 DIAGNOSIS — K920 Hematemesis: Secondary | ICD-10-CM

## 2015-12-04 DIAGNOSIS — I5032 Chronic diastolic (congestive) heart failure: Secondary | ICD-10-CM

## 2015-12-04 DIAGNOSIS — I10 Essential (primary) hypertension: Secondary | ICD-10-CM

## 2015-12-04 LAB — TYPE AND SCREEN
ABO/RH(D): A POS
Antibody Screen: POSITIVE
DAT, IgG: NEGATIVE
Donor AG Type: NEGATIVE
Donor AG Type: NEGATIVE
Donor AG Type: NEGATIVE
Donor AG Type: NEGATIVE
PT AG Type: NEGATIVE
Unit division: 0
Unit division: 0
Unit division: 0
Unit division: 0

## 2015-12-04 LAB — BASIC METABOLIC PANEL
Anion gap: 5 (ref 5–15)
BUN: 7 mg/dL (ref 6–20)
CALCIUM: 8.1 mg/dL — AB (ref 8.9–10.3)
CHLORIDE: 113 mmol/L — AB (ref 101–111)
CO2: 23 mmol/L (ref 22–32)
CREATININE: 1.08 mg/dL (ref 0.61–1.24)
GFR, EST NON AFRICAN AMERICAN: 59 mL/min — AB (ref >60)
Glucose, Bld: 97 mg/dL (ref 65–99)
Potassium: 3 mmol/L — ABNORMAL LOW (ref 3.5–5.1)
SODIUM: 141 mmol/L (ref 135–145)

## 2015-12-04 LAB — CBC
HCT: 28.1 % — ABNORMAL LOW (ref 39.0–52.0)
HEMOGLOBIN: 9.4 g/dL — AB (ref 13.0–17.0)
MCH: 30.4 pg (ref 26.0–34.0)
MCHC: 33.5 g/dL (ref 30.0–36.0)
MCV: 90.9 fL (ref 78.0–100.0)
PLATELETS: 217 10*3/uL (ref 150–400)
RBC: 3.09 MIL/uL — ABNORMAL LOW (ref 4.22–5.81)
RDW: 17 % — AB (ref 11.5–15.5)
WBC: 6.5 10*3/uL (ref 4.0–10.5)

## 2015-12-04 LAB — GLUCOSE, CAPILLARY
Glucose-Capillary: 88 mg/dL (ref 65–99)
Glucose-Capillary: 102 mg/dL — ABNORMAL HIGH (ref 65–99)
Glucose-Capillary: 127 mg/dL — ABNORMAL HIGH (ref 65–99)

## 2015-12-04 MED ORDER — SUCRALFATE 1 G PO TABS: 1.0000 g | ORAL_TABLET | Freq: Three times a day (TID) | ORAL | Status: AC

## 2015-12-04 MED ORDER — HEPARIN SOD (PORK) LOCK FLUSH 100 UNIT/ML IV SOLN
250.0000 [IU] | Freq: Every day | INTRAVENOUS | Status: DC
Start: 2015-12-04 — End: 2015-12-04
  Filled 2015-12-04: qty 3

## 2015-12-04 MED ORDER — HEPARIN SOD (PORK) LOCK FLUSH 100 UNIT/ML IV SOLN
250.0000 [IU] | INTRAVENOUS | Status: DC | PRN
  Administered 2015-12-04: 250 [IU]
  Filled 2015-12-04 (×2): qty 3

## 2015-12-04 MED ORDER — PANTOPRAZOLE SODIUM 40 MG PO TBEC: 40.0000 mg | DELAYED_RELEASE_TABLET | Freq: Two times a day (BID) | ORAL | Status: AC

## 2015-12-04 MED ORDER — POTASSIUM CHLORIDE CRYS ER 20 MEQ PO TBCR
40.0000 meq | EXTENDED_RELEASE_TABLET | ORAL | Status: AC
  Administered 2015-12-04 (×2): 40 meq via ORAL
  Filled 2015-12-04 (×2): qty 2

## 2015-12-04 MED ORDER — FUROSEMIDE 40 MG PO TABS: 40.0000 mg | ORAL_TABLET | Freq: Every day | ORAL | Status: DC

## 2015-12-04 NOTE — Clinical Social Work Note (Signed)
Patient to be discharged to [facility]. Patient's daughter, Dewayne Hatchnn, updated regarding discharge. Patient to be transported via EMS. RN report number: [phone]  [employer] auth: [id number]  Per RN, patient requires PICC line to be flushed prior to discharge. Awaiting IV team. If patient stable after 4pm, RN to please call to arrange for EMS at [phone] (Option 1, Option 3).  Chris Deistmily [place]ston [place], LCSW [phone] Orthopedics: [phone] Surgical: [phone]

## 2015-12-04 NOTE — Discharge Summary (Signed)
Physician Discharge Summary  [name]:[id number] DOB: Apr 07, 1925 DOA: 11/29/2015  PCP: Tonette Lederer, PA-C  Admit date: 11/29/2015 Discharge date: 12/04/2015  Admitted From: SNF Disposition:  SNF   Recommendations for Outpatient Follow-up:  Follow stools for blood. Holding ASA and Plavix- re-assess in 5 days and decide if they can be resumed. Need CBC in 5 days. Need Bmet in 5 days and daily weights to adjust dose of Lasix if needed.   Discharge Condition:  stable   CODE STATUS:   DNR (do not resuscitate)   Diet recommendation:  Low sodium, heart healthy- low residue for 1 wk Consultations:  GI    Discharge Diagnoses:  Principal Problem:   Hematemesis/  Mallory-Weiss tear  Active Problems:   Chronic diastolic CHF (congestive heart failure) (HCC)   Aortic valve stenosis   Essential hypertension   Atrial fibrillation (HCC)   Diabetic foot (HCC)   Heel ulcer (HCC)   Great toe amputation status (HCC), L foot , recent   Acute blood loss anemia   Ulcer of left heel (HCC), recent I&D   Non-insulin dependent type 2 diabetes mellitus (HCC)   PVD (peripheral vascular disease), recent intervention at [facility] last week   DNR (do not resuscitate)      Subjective: No complaints today. Stools non-bloody and more solid. Eating well. Without nausea or pain.   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 He was at a SNF after [facility] on 6/26.   Hospital Course:  Upper GI bleed - has undergone EGD showing a Mallory Weiss tear - hematemesis resolved, passed a number of black stools in the first 24 hrs which then resolved - stool is now brown and becoming more formed - Protonix BID and Carafate  - advanced to low residue yesterday and  followed over night- no recurrence of bleeding   Active Problems:  Acute blood loss anemia - -6/28 Hb 7.1 - transfused 2 U PRBC on 6/28 - Hb back down to 7.6 on 6/30- transfused 1 more Unit- Hb 9 for 2 days now  - Gangrene b/l heals - left 1st ray amputation for osteomyelitis - L heal wound grew out MRSA, enteroccus and the proteus. - on Vanc and Zosyn via PICC- 2 wk course from day of surgery - due to continue through 6/7- being managed by Dr Sampson Goon, ID   Non-insulin dependent type 2 diabetes mellitus - Januvia on hold during hospital stay - can resume   PVD (peripheral vascular disease) - 6/22- recanalization left lower extremity with angioplasty of the posterior tibial artery for limb salvage - 6/22 - Excisional debridement of necrotic eschar from posterior left heel ulceration - 6/23- recanalization right lower extremity and aspiration of thrombus from popliteal and post tibial arteries - Aspirin and Plavix on hold for now  CAD with Stent (2010) - ASA/ Plavix on hold  CKD 3 - Cr 1.27 > 1.13  Parkinson's disease - Sinemet  Gout - Colchicine   A-fib - sinus bradycardia here - Amiodarone- not on anticoagulation other than ASA and Plavix  BPH - Flomax-   Hypothyroid - Synthroid  Hypokalemia - replaced    Discharge Instructions      Discharge Instructions    (HEART FAILURE PATIENTS) Call MD:  Anytime you have any  of the following symptoms: 1) 3 pound weight gain in 24 hours or 5 pounds in 1 week 2) shortness of breath, with or without a dry hacking cough 3) swelling in the hands, feet or stomach 4) if you have to sleep on extra pillows at night in order to breathe.    Complete by:  As directed      Call MD for:  persistant dizziness or light-headedness    Complete by:  As directed      Call MD for:  persistant nausea and vomiting    Complete by:  As directed      Discharge instructions    Complete by:  As directed   Follow stools for blood.  Holding ASA and Plavix- re-assess in 5 days and decide if they can be resumed. Need CBC in 5 days. Need Bmet in 5 days and daily weights to adjust dose of Lasix if needed.     Increase activity slowly    Complete by:  As directed             Medication List    STOP taking these medications        PEPCID IV      TAKE these medications        amiodarone 100 MG tablet  Commonly known as:  PACERONE  Take 100 mg by mouth daily.     carbidopa-levodopa 25-100 MG tablet  Commonly known as:  SINEMET IR  Take 1 tablet by mouth 4 (four) times daily.     colchicine 0.6 MG tablet  Take 0.6 mg by mouth daily.     feeding supplement (GLUCERNA SHAKE) Liqd  Take 237 mLs by mouth 3 (three) times daily between meals.     feeding supplement (PRO-STAT SUGAR FREE 64) Liqd  Take 30 mLs by mouth 2 (two) times daily between meals.     furosemide 40 MG tablet  Commonly known as:  LASIX  Take 1 tablet (40 mg total) by mouth daily.     heparin flush 10 UNIT/ML Soln injection  Inject 10 Units into the vein 4 (four) times daily.     levothyroxine 150 MCG tablet  Commonly known as:  SYNTHROID, LEVOTHROID  Take 150 mcg by mouth daily before breakfast.     misoprostol 100 MCG tablet  Commonly known as:  CYTOTEC  Take 100 mcg by mouth 4 (four) times daily.     oxyCODONE-acetaminophen 5-325 MG tablet  Commonly known as:  PERCOCET/ROXICET  Take 1 tablet by mouth every 4 (four) hours as needed for moderate pain.     pantoprazole 40 MG tablet  Commonly known as:  PROTONIX  Take 1 tablet (40 mg total) by mouth 2 (two) times daily.     piperacillin-tazobactam 3.375 GM/50ML IVPB  Commonly known as:  ZOSYN  Inject 50 mLs (3.375 g total) into the vein every 8 (eight) hours.     potassium chloride 10 MEQ tablet  Commonly known as:  K-DUR,KLOR-CON  Take 10 mEq by mouth daily.     pramipexole 1 MG tablet  Commonly known as:  MIRAPEX  Take 1 mg by mouth 4 (four) times daily.     sitaGLIPtin 100  MG tablet  Commonly known as:  JANUVIA  Take 1 tablet (100 mg total) by mouth daily.     sodium chloride flush 0.9 % Soln injection  Inject 10 mLs into the vein 4 (four) times daily.     sucralfate 1 g tablet  Commonly  known as:  CARAFATE  Take 1 tablet (1 g total) by mouth 4 (four) times daily -  with meals and at bedtime.     tamsulosin 0.4 MG Caps capsule  Commonly known as:  FLOMAX  Take 1 capsule (0.4 mg total) by mouth daily after supper.     vancomycin 1-5 GM/200ML-% Soln  Commonly known as:  VANCOCIN  Inject 200 mLs (1,000 mg total) into the vein daily.        No Known Allergies   Procedures/Studies: EGD on 6/28- Mallory-Weiss tear.  - Non-bleeding esophageal ulcer.  - Small hiatal hernia.  - Normal examined duodenum.  - No specimens collected.  Dg Chest 1 View  11/08/2015  CLINICAL DATA:  Status post PICC line placement EXAM: CHEST 1 VIEW COMPARISON:  Not 07/14/2015 FINDINGS: Cardiac shadow is within normal limits. Elevation of the right hemidiaphragm is again seen. A new right PICC line is noted with the tip projecting at the cavoatrial junction. No other focal abnormality is seen. No bony abnormality is noted. IMPRESSION: PICC line at the SVC/ RA junction. Electronically Signed   By: Alcide CleverMark  Lukens M.D.   On: 11/08/2015 21:53   Dg Chest Port 1 View  11/30/2015  CLINICAL DATA:  GI bleed. EXAM: PORTABLE CHEST 1 VIEW COMPARISON:  November 08, 2015 FINDINGS: The right PICC line is stable. There is elevation of the right hemidiaphragm. Increased interstitial markings suggest mild edema. The heart, hila, mediastinum, lungs, and pleura are otherwise unchanged. IMPRESSION: Mild edema. Electronically Signed   By: Gerome Samavid  Williams III M.D   On: 11/30/2015 01:32       Discharge Exam: Ceasar MonsFiled Vitals:   12/03/15 2101 12/04/15 0434  BP: 135/85 135/65  Pulse: 61 59  Temp: 98 F (36.7 C)  97.8 F (36.6 C)  Resp: 18 18   Filed Vitals:   12/03/15 0625 12/03/15 1637 12/03/15 2101 12/04/15 0434  BP: 159/92 141/57 135/85 135/65  Pulse: 61 59 61 59  Temp: 97.4 F (36.3 C) 98 F (36.7 C) 98 F (36.7 C) 97.8 F (36.6 C)  TempSrc: Oral Oral Oral Oral  Resp: 16 18 18 18   Height:      Weight:      SpO2: 95% 98% 94% 95%    General: Pt is alert, awake, not in acute distress Cardiovascular: RRR, S1/S2 +, no rubs, no gallops Respiratory: CTA bilaterally, no wheezing, no rhonchi Abdominal: Soft, NT, ND, bowel sounds + Extremities: no edema, no cyanosis    The results of significant diagnostics from this hospitalization (including imaging, microbiology, ancillary and laboratory) are listed below for reference.     Microbiology: No results found for this or any previous visit (from the past 240 hour(s)).   Labs: BNP (last 3 results) No results for input(s): BNP in the last 8760 hours. Basic Metabolic Panel:  Recent Labs Lab 11/28/15 0434 11/29/15 1000 11/29/15 1847 12/01/15 0445 12/04/15 0334  NA  --  138 137 141 141  K  --  3.9 4.3 3.6 3.0*  CL  --  103 102 113* 113*  CO2  --  27 24 25 23   GLUCOSE  --  158* 175* 118* 97  BUN  --  17 25* 27* 7  CREATININE 1.10 1.20 1.27* 1.13 1.08  CALCIUM  --  8.4* 8.7* 8.2* 8.1*   Liver Function Tests:  Recent Labs Lab 11/29/15 1000 11/29/15 1847  AST 13* 14*  ALT 8* 9*  ALKPHOS 147* 151*  BILITOT 0.9 0.6  PROT 5.2* 5.6*  ALBUMIN 2.3* 2.6*   No results for input(s): LIPASE, AMYLASE in the last 168 hours. No results for input(s): AMMONIA in the last 168 hours. CBC:  Recent Labs Lab 11/29/15 1000 11/29/15 1847  12/01/15 0445 12/02/15 0440 12/02/15 1705 12/03/15 0430 12/04/15 0334  WBC 8.8 11.1*  < > 9.2 6.9 6.3 6.7 6.5  NEUTROABS 6.7* 8.2*  --   --   --   --   --   --   HGB 8.8* 9.1*  < > 8.2* 7.6* 8.7* 9.2* 9.4*  HCT 25.4* 27.2*  < > 24.1* 23.1* 25.8* 27.6* 28.1*  MCV 88.8 90.6  < > 90.3 91.3 88.4  90.8 90.9  PLT 222 266  < > 217 200 205 212 217  < > = values in this interval not displayed. Cardiac Enzymes:  Recent Labs Lab 11/29/15 1847  TROPONINI <0.03   BNP: Invalid input(s): POCBNP CBG:  Recent Labs Lab 12/03/15 0828 12/03/15 1202 12/03/15 1654 12/03/15 2103 12/04/15 0756  GLUCAP 89 106* 136* 120* 88   D-Dimer No results for input(s): DDIMER in the last 72 hours. Hgb A1c No results for input(s): HGBA1C in the last 72 hours. Lipid Profile No results for input(s): CHOL, HDL, LDLCALC, TRIG, CHOLHDL, LDLDIRECT in the last 72 hours. Thyroid function studies No results for input(s): TSH, T4TOTAL, T3FREE, THYROIDAB in the last 72 hours.  Invalid input(s): FREET3 Anemia work up No results for input(s): VITAMINB12, FOLATE, FERRITIN, TIBC, IRON, RETICCTPCT in the last 72 hours. Urinalysis    Component Value Date/Time   COLORURINE YELLOW* 11/29/2015 2116   APPEARANCEUR CLEAR* 11/29/2015 2116   APPEARANCEUR Cloudy* 09/27/2015 1506   LABSPEC 1.025 11/29/2015 2116   PHURINE 5.0 11/29/2015 2116   GLUCOSEU NEGATIVE 11/29/2015 2116   HGBUR NEGATIVE 11/29/2015 2116   BILIRUBINUR NEGATIVE 11/29/2015 2116   BILIRUBINUR Negative 09/27/2015 1506   KETONESUR TRACE* 11/29/2015 2116   PROTEINUR NEGATIVE 11/29/2015 2116   PROTEINUR 1+* 09/27/2015 1506   NITRITE NEGATIVE 11/29/2015 2116   NITRITE Positive* 09/27/2015 1506   LEUKOCYTESUR NEGATIVE 11/29/2015 2116   LEUKOCYTESUR 2+* 09/27/2015 1506   Sepsis Labs Invalid input(s): PROCALCITONIN,  WBC,  LACTICIDVEN Microbiology No results found for this or any previous visit (from the past 240 hour(s)).   Time coordinating discharge: Over 30 minutes  SIGNED:   Calvert CantorIZWAN,Dewarren Ledbetter, MD  [facility] 12/04/2015, 12:47 PM Pager   If 7PM-7AM, please contact night-coverage [email or web address] Password [email or web address]

## 2015-12-05 DIAGNOSIS — E876 Hypokalemia: Secondary | ICD-10-CM | POA: Diagnosis present

## 2015-12-05 LAB — GLUCOSE, CAPILLARY
GLUCOSE-CAPILLARY: 98 mg/dL (ref 65–99)
GLUCOSE-CAPILLARY: 174 mg/dL — AB (ref 65–99)
GLUCOSE-CAPILLARY: 135 mg/dL — AB (ref 65–99)
GLUCOSE-CAPILLARY: 114 mg/dL — AB (ref 65–99)
Glucose-Capillary: 114 mg/dL — ABNORMAL HIGH (ref 65–99)

## 2015-12-05 LAB — BASIC METABOLIC PANEL
Anion gap: 5 (ref 5–15)
BUN: 6 mg/dL (ref 6–20)
CHLORIDE: 111 mmol/L (ref 101–111)
CO2: 24 mmol/L (ref 22–32)
CREATININE: 1.01 mg/dL (ref 0.61–1.24)
Calcium: 8 mg/dL — ABNORMAL LOW (ref 8.9–10.3)
GFR calc non Af Amer: >60 mL/min (ref >60)
Glucose, Bld: 109 mg/dL — ABNORMAL HIGH (ref 65–99)
POTASSIUM: 3.3 mmol/L — AB (ref 3.5–5.1)
SODIUM: 140 mmol/L (ref 135–145)

## 2015-12-06 DIAGNOSIS — E876 Hypokalemia: Secondary | ICD-10-CM | POA: Diagnosis not present

## 2015-12-06 LAB — GLUCOSE, CAPILLARY
GLUCOSE-CAPILLARY: 125 mg/dL — AB (ref 65–99)
GLUCOSE-CAPILLARY: 127 mg/dL — AB (ref 65–99)
Glucose-Capillary: 99 mg/dL (ref 65–99)
Glucose-Capillary: 121 mg/dL — ABNORMAL HIGH (ref 65–99)

## 2015-12-07 ENCOUNTER — Encounter: Payer: Self-pay | Admitting: Emergency Medicine

## 2015-12-07 ENCOUNTER — Non-Acute Institutional Stay (SKILLED_NURSING_FACILITY): Payer: PPO | Admitting: Gerontology

## 2015-12-07 ENCOUNTER — Emergency Department: Payer: PPO

## 2015-12-07 ENCOUNTER — Observation Stay
Admission: EM | Admit: 2015-12-07 | Discharge: 2015-12-08 | Disposition: A | Discharge status: Skilled Nursing Facility | Payer: PPO | Attending: Internal Medicine | Admitting: Internal Medicine

## 2015-12-07 DIAGNOSIS — I4891 Unspecified atrial fibrillation: Secondary | ICD-10-CM | POA: Insufficient documentation

## 2015-12-07 DIAGNOSIS — I350 Nonrheumatic aortic (valve) stenosis: Secondary | ICD-10-CM | POA: Insufficient documentation

## 2015-12-07 DIAGNOSIS — E11621 Type 2 diabetes mellitus with foot ulcer: Secondary | ICD-10-CM | POA: Insufficient documentation

## 2015-12-07 DIAGNOSIS — K920 Hematemesis: Secondary | ICD-10-CM | POA: Diagnosis not present

## 2015-12-07 DIAGNOSIS — E860 Dehydration: Secondary | ICD-10-CM | POA: Insufficient documentation

## 2015-12-07 DIAGNOSIS — Z79891 Long term (current) use of opiate analgesic: Secondary | ICD-10-CM | POA: Diagnosis not present

## 2015-12-07 DIAGNOSIS — G20 Parkinson's disease: Secondary | ICD-10-CM | POA: Diagnosis not present

## 2015-12-07 DIAGNOSIS — K8020 Calculus of gallbladder without cholecystitis without obstruction: Secondary | ICD-10-CM | POA: Insufficient documentation

## 2015-12-07 DIAGNOSIS — I70202 Unspecified atherosclerosis of native arteries of extremities, left leg: Secondary | ICD-10-CM | POA: Diagnosis not present

## 2015-12-07 DIAGNOSIS — L8990 Pressure ulcer of unspecified site, unspecified stage: Secondary | ICD-10-CM | POA: Insufficient documentation

## 2015-12-07 DIAGNOSIS — J189 Pneumonia, unspecified organism: Secondary | ICD-10-CM | POA: Diagnosis not present

## 2015-12-07 DIAGNOSIS — Z89422 Acquired absence of other left toe(s): Secondary | ICD-10-CM | POA: Diagnosis not present

## 2015-12-07 DIAGNOSIS — Z79899 Other long term (current) drug therapy: Secondary | ICD-10-CM | POA: Diagnosis not present

## 2015-12-07 DIAGNOSIS — Z7984 Long term (current) use of oral hypoglycemic drugs: Secondary | ICD-10-CM | POA: Insufficient documentation

## 2015-12-07 DIAGNOSIS — A419 Sepsis, unspecified organism: Secondary | ICD-10-CM

## 2015-12-07 DIAGNOSIS — L89899 Pressure ulcer of other site, unspecified stage: Secondary | ICD-10-CM | POA: Diagnosis not present

## 2015-12-07 DIAGNOSIS — I959 Hypotension, unspecified: Secondary | ICD-10-CM | POA: Insufficient documentation

## 2015-12-07 DIAGNOSIS — N179 Acute kidney failure, unspecified: Secondary | ICD-10-CM | POA: Diagnosis present

## 2015-12-07 DIAGNOSIS — G9340 Encephalopathy, unspecified: Secondary | ICD-10-CM | POA: Diagnosis not present

## 2015-12-07 DIAGNOSIS — E1151 Type 2 diabetes mellitus with diabetic peripheral angiopathy without gangrene: Secondary | ICD-10-CM | POA: Insufficient documentation

## 2015-12-07 DIAGNOSIS — M8580 Other specified disorders of bone density and structure, unspecified site: Secondary | ICD-10-CM | POA: Insufficient documentation

## 2015-12-07 DIAGNOSIS — Z8249 Family history of ischemic heart disease and other diseases of the circulatory system: Secondary | ICD-10-CM | POA: Insufficient documentation

## 2015-12-07 DIAGNOSIS — Z792 Long term (current) use of antibiotics: Secondary | ICD-10-CM | POA: Insufficient documentation

## 2015-12-07 DIAGNOSIS — I700 Atherosclerosis of aorta: Secondary | ICD-10-CM | POA: Insufficient documentation

## 2015-12-07 DIAGNOSIS — E039 Hypothyroidism, unspecified: Secondary | ICD-10-CM | POA: Insufficient documentation

## 2015-12-07 DIAGNOSIS — Z8673 Personal history of transient ischemic attack (TIA), and cerebral infarction without residual deficits: Secondary | ICD-10-CM | POA: Insufficient documentation

## 2015-12-07 DIAGNOSIS — Z955 Presence of coronary angioplasty implant and graft: Secondary | ICD-10-CM | POA: Diagnosis not present

## 2015-12-07 DIAGNOSIS — E785 Hyperlipidemia, unspecified: Secondary | ICD-10-CM | POA: Insufficient documentation

## 2015-12-07 DIAGNOSIS — Z9049 Acquired absence of other specified parts of digestive tract: Secondary | ICD-10-CM | POA: Diagnosis not present

## 2015-12-07 DIAGNOSIS — Z9889 Other specified postprocedural states: Secondary | ICD-10-CM | POA: Diagnosis not present

## 2015-12-07 DIAGNOSIS — I2510 Atherosclerotic heart disease of native coronary artery without angina pectoris: Secondary | ICD-10-CM | POA: Insufficient documentation

## 2015-12-07 DIAGNOSIS — K219 Gastro-esophageal reflux disease without esophagitis: Secondary | ICD-10-CM | POA: Insufficient documentation

## 2015-12-07 DIAGNOSIS — M869 Osteomyelitis, unspecified: Secondary | ICD-10-CM

## 2015-12-07 DIAGNOSIS — E876 Hypokalemia: Secondary | ICD-10-CM | POA: Diagnosis not present

## 2015-12-07 LAB — URINALYSIS COMPLETE WITH MICROSCOPIC (ARMC ONLY)
BILIRUBIN URINE: NEGATIVE
GLUCOSE, UA: NEGATIVE mg/dL
Hgb urine dipstick: NEGATIVE
LEUKOCYTES UA: NEGATIVE
NITRITE: NEGATIVE
PROTEIN: 30 mg/dL — AB
SPECIFIC GRAVITY, URINE: 1.016 (ref 1.005–1.030)
pH: 6 (ref 5.0–8.0)

## 2015-12-07 LAB — CBC
HCT: 36.2 % — ABNORMAL LOW (ref 40.0–52.0)
HEMOGLOBIN: 12.6 g/dL — AB (ref 13.0–18.0)
MCH: 31.2 pg (ref 26.0–34.0)
MCHC: 34.8 g/dL (ref 32.0–36.0)
MCV: 89.5 fL (ref 80.0–100.0)
Platelets: 253 10*3/uL (ref 150–440)
RBC: 4.04 MIL/uL — AB (ref 4.40–5.90)
RDW: 16.9 % — ABNORMAL HIGH (ref 11.5–14.5)
WBC: 10.2 10*3/uL (ref 3.8–10.6)

## 2015-12-07 LAB — COMPREHENSIVE METABOLIC PANEL
ALK PHOS: 125 U/L (ref 38–126)
AST: 12 U/L — ABNORMAL LOW (ref 15–41)
Albumin: 2.8 g/dL — ABNORMAL LOW (ref 3.5–5.0)
Anion gap: 11 (ref 5–15)
BUN: 11 mg/dL (ref 6–20)
CALCIUM: 8.6 mg/dL — AB (ref 8.9–10.3)
CO2: 21 mmol/L — AB (ref 22–32)
Chloride: 107 mmol/L (ref 101–111)
Creatinine, Ser: 1.41 mg/dL — ABNORMAL HIGH (ref 0.61–1.24)
GFR calc non Af Amer: 43 mL/min — ABNORMAL LOW (ref >60)
GFR, EST AFRICAN AMERICAN: 50 mL/min — AB (ref >60)
Glucose, Bld: 164 mg/dL — ABNORMAL HIGH (ref 65–99)
Potassium: 2.9 mmol/L — ABNORMAL LOW (ref 3.5–5.1)
SODIUM: 139 mmol/L (ref 135–145)
Total Bilirubin: 1.2 mg/dL (ref 0.3–1.2)
Total Protein: 5.9 g/dL — ABNORMAL LOW (ref 6.5–8.1)

## 2015-12-07 LAB — WOUND CULTURE

## 2015-12-07 LAB — PROTIME-INR
INR: 1.2
Prothrombin Time: 15.4 seconds — ABNORMAL HIGH (ref 11.4–15.0)

## 2015-12-07 LAB — LACTIC ACID, PLASMA: LACTIC ACID, VENOUS: 1.9 mmol/L (ref 0.5–1.9)

## 2015-12-07 LAB — GLUCOSE, CAPILLARY: Glucose-Capillary: 103 mg/dL — ABNORMAL HIGH (ref 65–99)

## 2015-12-07 LAB — MRSA PCR SCREENING: MRSA BY PCR: POSITIVE — AB

## 2015-12-07 LAB — TYPE AND SCREEN
ABO/RH(D): A POS
ANTIBODY SCREEN: NEGATIVE

## 2015-12-07 LAB — LIPASE, BLOOD: Lipase: <10 U/L — ABNORMAL LOW (ref 11–51)

## 2015-12-07 LAB — TROPONIN I: Troponin I: <0.03 ng/mL (ref <0.03)

## 2015-12-07 LAB — APTT: aPTT: 50 seconds — ABNORMAL HIGH (ref 24–36)

## 2015-12-07 MED ORDER — PANTOPRAZOLE SODIUM 40 MG PO TBEC
40.0000 mg | DELAYED_RELEASE_TABLET | Freq: Two times a day (BID) | ORAL | Status: DC
  Administered 2015-12-07 – 2015-12-08 (×2): 40 mg via ORAL
  Filled 2015-12-07 (×2): qty 1

## 2015-12-07 MED ORDER — ACETAMINOPHEN 650 MG RE SUPP: 650.0000 mg | Freq: Four times a day (QID) | RECTAL | Status: DC | PRN

## 2015-12-07 MED ORDER — MISOPROSTOL 100 MCG PO TABS
100.0000 ug | ORAL_TABLET | Freq: Four times a day (QID) | ORAL | Status: DC
  Administered 2015-12-07 – 2015-12-08 (×4): 100 ug via ORAL
  Filled 2015-12-07 (×7): qty 1

## 2015-12-07 MED ORDER — SODIUM CHLORIDE 0.9 % IV SOLN
10.0000 mL/h | Freq: Once | INTRAVENOUS | Status: AC
  Administered 2015-12-07: 10 mL/h via INTRAVENOUS

## 2015-12-07 MED ORDER — SODIUM CHLORIDE 0.9 % IV BOLUS (SEPSIS)
500.0000 mL | Freq: Once | INTRAVENOUS | Status: AC
  Administered 2015-12-07: 500 mL via INTRAVENOUS

## 2015-12-07 MED ORDER — SODIUM CHLORIDE 0.9 % IV SOLN
INTRAVENOUS | Status: DC
  Administered 2015-12-07 – 2015-12-08 (×2): via INTRAVENOUS

## 2015-12-07 MED ORDER — ACETAMINOPHEN 325 MG PO TABS: 650.0000 mg | ORAL_TABLET | Freq: Four times a day (QID) | ORAL | Status: DC | PRN

## 2015-12-07 MED ORDER — VANCOMYCIN HCL IN DEXTROSE 1-5 GM/200ML-% IV SOLN: 1000.0000 mg | INTRAVENOUS | Status: DC

## 2015-12-07 MED ORDER — CARBIDOPA-LEVODOPA 25-100 MG PO TABS
1.0000 | ORAL_TABLET | Freq: Four times a day (QID) | ORAL | Status: DC
  Administered 2015-12-07 – 2015-12-08 (×4): 1 via ORAL
  Filled 2015-12-07 (×7): qty 1

## 2015-12-07 MED ORDER — POTASSIUM CHLORIDE CRYS ER 20 MEQ PO TBCR
20.0000 meq | EXTENDED_RELEASE_TABLET | Freq: Two times a day (BID) | ORAL | Status: DC
  Administered 2015-12-07 – 2015-12-08 (×3): 20 meq via ORAL
  Filled 2015-12-07 (×3): qty 1

## 2015-12-07 MED ORDER — OXYCODONE-ACETAMINOPHEN 5-325 MG PO TABS
1.0000 | ORAL_TABLET | ORAL | Status: DC | PRN
  Administered 2015-12-08: 15:00:00 1 via ORAL
  Filled 2015-12-07: qty 1

## 2015-12-07 MED ORDER — HEPARIN SOD (PORK) LOCK FLUSH 10 UNIT/ML IV SOLN
10.0000 [IU] | Freq: Four times a day (QID) | INTRAVENOUS | Status: DC
  Administered 2015-12-07 – 2015-12-08 (×4): 10 [IU] via INTRAVENOUS
  Filled 2015-12-07 (×8): qty 1

## 2015-12-07 MED ORDER — ENOXAPARIN SODIUM 40 MG/0.4ML ~~LOC~~ SOLN
40.0000 mg | SUBCUTANEOUS | Status: DC
  Administered 2015-12-07: 40 mg via SUBCUTANEOUS
  Filled 2015-12-07: qty 0.4

## 2015-12-07 MED ORDER — GLUCERNA SHAKE PO LIQD
237.0000 mL | Freq: Three times a day (TID) | ORAL | Status: DC
  Administered 2015-12-08 (×2): 237 mL via ORAL

## 2015-12-07 MED ORDER — SUCRALFATE 1 G PO TABS
1.0000 g | ORAL_TABLET | Freq: Three times a day (TID) | ORAL | Status: DC
  Administered 2015-12-07 – 2015-12-08 (×4): 1 g via ORAL
  Filled 2015-12-07 (×4): qty 1

## 2015-12-07 MED ORDER — PIPERACILLIN-TAZOBACTAM 3.375 G IVPB
3.3750 g | Freq: Three times a day (TID) | INTRAVENOUS | Status: DC
  Administered 2015-12-07 – 2015-12-08 (×3): 3.375 g via INTRAVENOUS
  Filled 2015-12-07 (×6): qty 50

## 2015-12-07 MED ORDER — SODIUM CHLORIDE 0.9 % IV BOLUS (SEPSIS): 1000.0000 mL | Freq: Once | INTRAVENOUS | Status: DC

## 2015-12-07 MED ORDER — LEVOTHYROXINE SODIUM 50 MCG PO TABS
150.0000 ug | ORAL_TABLET | Freq: Every day | ORAL | Status: DC
  Administered 2015-12-08: 150 ug via ORAL
  Filled 2015-12-07: qty 1

## 2015-12-07 MED ORDER — POTASSIUM CHLORIDE CRYS ER 10 MEQ PO TBCR: 10.0000 meq | EXTENDED_RELEASE_TABLET | Freq: Every day | ORAL | Status: DC

## 2015-12-07 MED ORDER — PRAMIPEXOLE DIHYDROCHLORIDE 1 MG PO TABS
1.0000 mg | ORAL_TABLET | Freq: Four times a day (QID) | ORAL | Status: DC
  Administered 2015-12-07 – 2015-12-08 (×4): 1 mg via ORAL
  Filled 2015-12-07 (×5): qty 1

## 2015-12-07 MED ORDER — SODIUM CHLORIDE FLUSH 0.9 % IV SOLN
10.0000 mL | Freq: Four times a day (QID) | INTRAVENOUS | Status: DC
  Administered 2015-12-07 – 2015-12-08 (×4): 10 mL via INTRAVENOUS
  Filled 2015-12-07 (×7): qty 10

## 2015-12-07 MED ORDER — CETYLPYRIDINIUM CHLORIDE 0.05 % MT LIQD
7.0000 mL | Freq: Two times a day (BID) | OROMUCOSAL | Status: DC
  Administered 2015-12-07 – 2015-12-08 (×2): 7 mL via OROMUCOSAL

## 2015-12-07 MED ORDER — VANCOMYCIN HCL IN DEXTROSE 1-5 GM/200ML-% IV SOLN
1000.0000 mg | Freq: Once | INTRAVENOUS | Status: AC
  Administered 2015-12-07: 1000 mg via INTRAVENOUS
  Filled 2015-12-07: qty 200

## 2015-12-07 MED ORDER — SODIUM CHLORIDE 0.9 % IV BOLUS (SEPSIS)
1000.0000 mL | Freq: Once | INTRAVENOUS | Status: AC
  Administered 2015-12-07: 1000 mL via INTRAVENOUS

## 2015-12-07 MED ORDER — SODIUM CHLORIDE 0.9 % IV SOLN
1.0000 g | Freq: Once | INTRAVENOUS | Status: AC
  Administered 2015-12-07: 1 g via INTRAVENOUS
  Filled 2015-12-07: qty 1

## 2015-12-07 MED ORDER — SODIUM CHLORIDE 0.9 % IV SOLN
1000.0000 mL | Freq: Once | INTRAVENOUS | Status: AC
  Administered 2015-12-07: 1000 mL via INTRAVENOUS

## 2015-12-07 MED ORDER — ONDANSETRON HCL 4 MG/2ML IJ SOLN: 4.0000 mg | Freq: Four times a day (QID) | INTRAMUSCULAR | Status: DC | PRN

## 2015-12-07 MED ORDER — ONDANSETRON HCL 4 MG PO TABS: 4.0000 mg | ORAL_TABLET | Freq: Four times a day (QID) | ORAL | Status: DC | PRN

## 2015-12-07 MED ORDER — LINAGLIPTIN 5 MG PO TABS
5.0000 mg | ORAL_TABLET | Freq: Every day | ORAL | Status: DC
  Administered 2015-12-08: 08:00:00 5 mg via ORAL
  Filled 2015-12-07: qty 1

## 2015-12-07 MED ORDER — AMIODARONE HCL 200 MG PO TABS
100.0000 mg | ORAL_TABLET | Freq: Every day | ORAL | Status: DC
  Administered 2015-12-08: 08:00:00 100 mg via ORAL
  Filled 2015-12-07: qty 1

## 2015-12-07 MED ORDER — COLCHICINE 0.6 MG PO TABS
0.6000 mg | ORAL_TABLET | Freq: Every day | ORAL | Status: DC
  Administered 2015-12-08: 08:00:00 0.6 mg via ORAL
  Filled 2015-12-07: qty 1

## 2015-12-07 NOTE — ED Provider Notes (Signed)
[facility] Emergency Department Provider Note  ____________________________________________  Time seen: Approximately 11:54 AM  I have reviewed the triage vital signs and the nursing notes.   HISTORY  Chief Complaint Altered Mental Status and Hypotension   HPI [name] is a 82 y.o. 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 Patient was discharged after a Mallory-Weiss tear and upper GI bleed on 7/2 from [facility]. According to his daughter he was doing well until this morning when he started complaining of nausea and was very pale. Upon arrival to the emergency department patient was hypotensive with systolics in the 60s, heart rate in the 80s. Patient complaining of nausea but denies chest pain, shortness of breath, abdominal pain, melena, vomiting, hematemesis, fever. Patient has had recent debridement of bilateral heel ulcers. He does have a wound VAC on the left heel and a PICC line. Patient has been discontinued from his Plavix since his GI bleed. His last dose of Zosyn was this morning at 5:30 AM. Patient's been compliant with his medications at his facility. Patient is DNR/DNI confirmed with his daughter.  Past Medical History  Diagnosis Date  . Coronary artery disease   . Diabetes mellitus without complication (HCC)   . Aortic stenosis   . Hypertension   . Hyperlipidemia   . TIA (transient ischemic attack)   . Thyroid disease   . Hypothyroidism     Patient Active Problem List   Diagnosis Date Noted  . Mallory-Weiss tear 12/04/2015  . Hematemesis 11/30/2015  . Great toe amputation status (HCC), L foot , recent 11/30/2015  . Acute blood loss anemia 11/30/2015  . Ulcer of left heel (HCC), recent I&D 11/30/2015  .  Non-insulin dependent type 2 diabetes mellitus (HCC) 11/30/2015  . PVD (peripheral vascular disease), [facility] 11/30/2015  . DNR (do not resuscitate) 11/30/2015  . Heel ulcer (HCC) 11/21/2015  . Diabetic foot (HCC) 11/04/2015  . Diabetes mellitus without complication (HCC) 07/11/2015  . Clavicle enlargement 07/11/2015  . Poorly controlled type 2 diabetes mellitus (HCC) 06/23/2014  . Bed sore on heel, right, unstageable (HCC) 09/23/2013  . Coronary atherosclerosis of native coronary artery 03/27/2013  . Chronic diastolic CHF (congestive heart failure) (HCC) 03/27/2013  . Aortic valve stenosis 03/27/2013  . Essential hypertension 03/27/2013  . Hyperlipidemia 03/27/2013  . Atrial fibrillation (HCC) 03/27/2013  . Bilateral leg edema 03/27/2013  . Constipation 03/27/2013    Past Surgical History  Procedure Laterality Date  . Appendectomy    . Hernia repair    . Knee surgery      bilateral  . Eye surgery    . Colonoscopy    . Cardiac catheterization  2010    showed a patient circumflex stent with noncritical CAD and normal right-sided pressures.  . Coronary angioplasty  01/24/2007    stent placement to the mid circumflex   . Knee surgery Left   . Peripheral vascular catheterization N/A 08/30/2015    Procedure: Abdominal Aortogram w/Lower Extremity;  Surgeon: Renford DillsGregory G Schnier, MD;  Location: [facility] INVASIVE CV LAB;  Service: Cardiovascular;  Laterality: N/A;  . Peripheral vascular catheterization  08/30/2015    Procedure: Lower Extremity Intervention;  Surgeon: Renford DillsGregory G Schnier, MD;  Location: [facility] INVASIVE CV LAB;  Service: Cardiovascular;;  .  Amputation toe Left 11/05/2015    Procedure: AMPUTATION TOE;  Surgeon: Recardo Evangelist, DPM;  Location: [facility] ORS;  Service: Podiatry;  Laterality: Left;  . Irrigation and debridement foot Left 11/05/2015    Procedure: IRRIGATION AND DEBRIDEMENT FOOT / HEEL ULCER;  Surgeon: Recardo Evangelist, DPM;  Location: [facility] ORS;   Service: Podiatry;  Laterality: Left;  . Application of wound vac Left 11/05/2015    Procedure: APPLICATION OF WOUND VAC;  Surgeon: Recardo Evangelist, DPM;  Location: [facility] ORS;  Service: Podiatry;  Laterality: Left;  . Peripheral vascular catheterization Left 11/23/2015    Procedure: Abdominal Aortogram w/Lower Extremity;  Surgeon: Renford Dills, MD;  Location: [facility] INVASIVE CV LAB;  Service: Cardiovascular;  Laterality: Left;  . Peripheral vascular catheterization  11/23/2015    Procedure: Lower Extremity Intervention;  Surgeon: Renford Dills, MD;  Location: [facility] INVASIVE CV LAB;  Service: Cardiovascular;;  . Irrigation and debridement foot Left 11/24/2015    Procedure: IRRIGATION AND DEBRIDEMENT FOOT;  Surgeon: Recardo Evangelist, DPM;  Location: [facility] ORS;  Service: Podiatry;  Laterality: Left;  . Peripheral vascular catheterization Right 11/25/2015    Procedure: Lower Extremity Angiography;  Surgeon: Renford Dills, MD;  Location: [facility] INVASIVE CV LAB;  Service: Cardiovascular;  Laterality: Right;  . Esophagogastroduodenoscopy N/A 11/30/2015    Procedure: ESOPHAGOGASTRODUODENOSCOPY (EGD);  Surgeon: Charlott Rakes, MD;  Location: [facility];  Service: Endoscopy;  Laterality: N/A;  . Esophageal tear      Current Outpatient Rx  Name  Route  Sig  Dispense  Refill  . Amino Acids-Protein Hydrolys (FEEDING SUPPLEMENT, PRO-STAT SUGAR FREE 64,) LIQD   Oral   Take 30 mLs by mouth 2 (two) times daily between meals.         Marland Kitchen amiodarone (PACERONE) 100 MG tablet   Oral   Take 100 mg by mouth daily.         . carbidopa-levodopa (SINEMET IR) 25-100 MG per tablet   Oral   Take 1 tablet by mouth 4 (four) times daily.         . colchicine 0.6 MG tablet   Oral   Take 0.6 mg by mouth daily.          . feeding supplement, GLUCERNA SHAKE, (GLUCERNA SHAKE) LIQD   Oral   Take 237 mLs by mouth 3 (three) times daily between meals.      0   . furosemide (LASIX) 40 MG tablet   Oral   Take  1 tablet (40 mg total) by mouth daily.   60 tablet   6   . heparin flush 10 UNIT/ML SOLN injection   Intravenous   Inject 10 Units into the vein 4 (four) times daily.         Marland Kitchen levothyroxine (SYNTHROID, LEVOTHROID) 150 MCG tablet   Oral   Take 150 mcg by mouth daily before breakfast.         . misoprostol (CYTOTEC) 100 MCG tablet   Oral   Take 100 mcg by mouth 4 (four) times daily.         Marland Kitchen oxyCODONE-acetaminophen (PERCOCET/ROXICET) 5-325 MG tablet   Oral   Take 1 tablet by mouth every 4 (four) hours as needed for moderate pain.   30 tablet   0   . pantoprazole (PROTONIX) 40 MG tablet   Oral   Take 1 tablet (40 mg total) by mouth 2 (two) times daily.         . piperacillin-tazobactam (ZOSYN) 3.375 GM/50ML IVPB  Intravenous   Inject 50 mLs (3.375 g total) into the vein every 8 (eight) hours.   50 mL   35   . potassium chloride (K-DUR,KLOR-CON) 10 MEQ tablet   Oral   Take 10 mEq by mouth daily.          . pramipexole (MIRAPEX) 1 MG tablet   Oral   Take 1 mg by mouth 4 (four) times daily.          . sitaGLIPtin (JANUVIA) 100 MG tablet   Oral   Take 1 tablet (100 mg total) by mouth daily.   90 tablet   1   . sodium chloride flush 0.9 % SOLN injection   Intravenous   Inject 10 mLs into the vein 4 (four) times daily.         . sucralfate (CARAFATE) 1 g tablet   Oral   Take 1 tablet (1 g total) by mouth 4 (four) times daily -  with meals and at bedtime.         . tamsulosin (FLOMAX) 0.4 MG CAPS capsule   Oral   Take 1 capsule (0.4 mg total) by mouth daily after supper.   30 capsule   6   . vancomycin (VANCOCIN) 1-5 GM/200ML-% SOLN   Intravenous   Inject 200 mLs (1,000 mg total) into the vein daily.   4000 mL   12     Allergies Review of patient's allergies indicates no known allergies.  Family History  Problem Relation Age of Onset  . Heart disease Mother   . Heart attack Father 3062    MI  . Heart attack Brother 139    MI     Social History Social History  Substance Use Topics  . Smoking status: Never Smoker   . Smokeless tobacco: Never Used  . Alcohol Use: No    Review of Systems Constitutional: Negative for fever. Eyes: Negative for visual changes. ENT: Negative for sore throat. Cardiovascular: Negative for chest pain. Respiratory: Negative for shortness of breath. Gastrointestinal: Negative for abdominal pain, vomiting or diarrhea. + nausea Genitourinary: Negative for dysuria. Musculoskeletal: Negative for back pain. Skin: Negative for rash. Neurological: Negative for headaches, weakness or numbness.  ____________________________________________   PHYSICAL EXAM:  VITAL SIGNS: ED Triage Vitals  Enc Vitals Group     BP 12/07/15 1132 71/48 mmHg     Pulse Rate 12/07/15 1132 84     Resp 12/07/15 1132 18     Temp 12/07/15 1132 97.5 F (36.4 C)     Temp Source 12/07/15 1132 Axillary     SpO2 12/07/15 1132 92 %     Weight 12/07/15 1132 173 lb (78.472 kg)     Height --      Head Cir --      Peak Flow --      Pain Score --      Pain Loc --      Pain Edu? --      Excl. in GC? --     Constitutional: Alert and oriented, pale. HEENT:      Head: Normocephalic and atraumatic.         Eyes: Conjunctivae are normal. Sclera is non-icteric. EOMI. PERRL      Mouth/Throat: Mucous membranes are moist.       Neck: Supple with no signs of meningismus. Cardiovascular: Regular rate and rhythm. No murmurs, gallops, or rubs. 2+ symmetrical distal pulses are present in all extremities. No JVD. Respiratory: Normal respiratory effort. Lungs  are clear to auscultation bilaterally. No wheezes, crackles, or rhonchi.  Gastrointestinal: Soft, non tender, and non distended with positive bowel sounds. No rebound or guarding. rectal exam showing yellow stool Hemoccult positive  Genitourinary: No suprapubic tenderness. No CVA tenderness. Musculoskeletal: Nontender with normal range of motion in all extremities. No  edema, cyanosis, or erythema of extremities. Well-healing bilateral foot ulcers with wound VAC on the left N eurologic: Normal speech and language. Face is symmetric. Moving all extremities. No gross focal neurologic deficits are appreciated. Skin: Skin is warm, dry and intact. No rash noted. Psychiatric: Mood and affect are normal. Speech and behavior are normal.  ____________________________________________   LABS (all labs ordered are listed, but only abnormal results are displayed)  Labs Reviewed  CBC - Abnormal; Notable for the following:    RBC 4.04 (*)    Hemoglobin 12.6 (*)    HCT 36.2 (*)    RDW 16.9 (*)    All other components within normal limits  COMPREHENSIVE METABOLIC PANEL - Abnormal; Notable for the following:    Potassium 2.9 (*)    CO2 21 (*)    Glucose, Bld 164 (*)    Creatinine, Ser 1.41 (*)    Calcium 8.6 (*)    Total Protein 5.9 (*)    Albumin 2.8 (*)    AST 12 (*)    ALT <5 (*)    GFR calc non Af Amer 43 (*)    GFR calc Af Amer 50 (*)    All other components within normal limits  LIPASE, BLOOD - Abnormal; Notable for the following:    Lipase <10 (*)    All other components within normal limits  PROTIME-INR - Abnormal; Notable for the following:    Prothrombin Time 15.4 (*)    All other components within normal limits  APTT - Abnormal; Notable for the following:    aPTT 50 (*)    All other components within normal limits  CULTURE, BLOOD (ROUTINE X 2)  CULTURE, BLOOD (ROUTINE X 2)  URINE CULTURE  TROPONIN I  LACTIC ACID, PLASMA  URINALYSIS COMPLETEWITH MICROSCOPIC ([facility] ONLY)  TYPE AND SCREEN  PREPARE RBC (CROSSMATCH)   ____________________________________________  EKG  ED ECG REPORT I, Nita Sickle, the attending physician, personally viewed and interpreted this ECG.  Normal sinus rhythm, rate of 91, normal intervals, normal axis, no ST elevations or depressions. EKG unchanged from prior.    ____________________________________________  RADIOLOGY  I, Nita Sickle, personally viewed and evaluated these images (plain radiographs) as part of my medical decision making, as well as reviewing the written report by the radiologist.  ____________________________________________   PROCEDURES  Procedure(s) performed: None Critical Care performed: Yes  CRITICAL CARE Performed by: Nita Sickle  ?  Total critical care time: 35 min  Critical care time was exclusive of separately billable procedures and treating other patients.  Critical care was necessary to treat or prevent imminent or life-threatening deterioration.  Critical care was time spent personally by me on the following activities: development of treatment plan with patient and/or surrogate as well as nursing, discussions with consultants, evaluation of patient's response to treatment, examination of patient, obtaining history from patient or surrogate, ordering and performing treatments and interventions, ordering and review of laboratory studies, ordering and review of radiographic studies, pulse oximetry and re-evaluation of patient's condition.  ____________________________________________   INITIAL IMPRESSION / ASSESSMENT AND PLAN / ED COURSE  82 y.o. male with PVD on Aspirin only, osteomyelitis of left heal (MRSA and Proteus)  s/p left 1st ray amputation on vancomycin and zosyn, venous insufficiency on Lasix, Parkinson's, A-fib (on Aspirin and Amio), CAD, NIDDM 2, Ao Stenosis, HTN who presents for evaluation of hypotension and nausea. Patient arrives hypotensive to the 60s with heart rate in the 80s, patient is pale, rectal exam showed yellow stool which is Hemoccult positive, heart regular rate and rhythm with no murmurs, abdomen is soft, lungs are clear to auscultation. 2 IVs were placed on top of patient's PICC line. Patient received 3 L of normal saline with improvement of his blood pressure. Patient  mental status remained stable. Initial lactate at 1.9, initial hemoglobin of 12.6, troponin is negative, EKG unchanged from baseline. Creatinine is elevated from 1.0-1.4. Clear etiology at this time but possible sepsis from his wounds, hospital-acquired pneumonia, bacteremia from his PICC line versus lower GI bleed versus dehydration. Antibiotics was broadened to meropenem and vancomycin. Patient be admitted to stepdown.  .[facility] Patient has received 30 cc/kg bolus. Repeat blood pressures in the 120s. Patient reports that he feels markedly improved. No longer pale. Discussed findings chest x-ray with radiologist who recommended a CT abdomen and pelvis without contrast to rule out bowel perforation.   CT ordered and patient admitted to stepdown.  Pertinent labs & imaging results that were available during my care of the patient were reviewed by me and considered in my medical decision making (see chart for details).    ____________________________________________   FINAL CLINICAL IMPRESSION(S) / ED DIAGNOSES  Final diagnoses:  Osteomyelitis (HCC)  Sepsis, due to unspecified organism (HCC)  AKI (acute kidney injury) (HCC)      NEW MEDICATIONS STARTED DURING THIS VISIT:  New Prescriptions   No medications on file     Note:  This document was prepared using Dragon voice recognition software and may include unintentional dictation errors.    Nita Sickle, MD 12/07/15 [phone]

## 2015-12-07 NOTE — Progress Notes (Signed)
   12/07/15 1600  Clinical Encounter Type  Visited With Patient and family together  Visit Type Spiritual support  Referral From Nurse  Consult/Referral To Chaplain  Spiritual Encounters  Spiritual Needs Prayer  Stress Factors  Patient Stress Factors Health changes  Family Stress Factors Health changes

## 2015-12-07 NOTE — Progress Notes (Signed)
Pharmacy Antibiotic Note  [name] is a 82 y.o. male admitted on 12/07/2015 with osteomyelitis.  Pharmacy has been consulted for vancomycin dosing.  Patient was admitted on vancomycin and piperacillin/tazobactam for osteomyelitis.   Plan: Patient was receiving vancomycin prior to admission for osteomyelitis. Outpatient dose was vancomycin 1000 mg IV q24h. He received one 1000 mg dose in the ED @ 1200 today.  Will order vancomycin level at 1130 tomorrow and adjust dosing based on level.   Patient also has orders for piperacillin/tazobactam 3.375 g IV q8h EI  Height: 6' (182.9 cm) Weight: 174 lb (78.926 kg) IBW/kg (Calculated) : 77.6  Temp (24hrs), Avg:98.2 F (36.8 C), Min:97.5 F (36.4 C), Max:98.9 F (37.2 C)   Recent Labs Lab 11/30/15 2117  12/01/15 0445 12/02/15 0440 12/02/15 1705 12/03/15 0430 12/04/15 0334 12/05/15 1425 12/07/15 1136 12/07/15 1145  WBC  --   < > 9.2 6.9 6.3 6.7 6.5  --  10.2  --   CREATININE  --   --  1.13  --   --   --  1.08 1.01 1.41*  --   LATICACIDVEN  --   --   --   --   --   --   --   --   --  1.9  VANCOTROUGH 12*  --   --   --   --   --   --   --   --   --   < > = values in this interval not displayed.  Estimated Creatinine Clearance: 39.7 mL/min (by C-G formula based on Cr of 1.41).    No Known Allergies  Antimicrobials this admission: vancomycin  Piperacillin/tazobactam   Dose adjustments this admission:  Microbiology results: 7/5 BCx: Sent 7/5 UCx: Sent   Thank you for allowing pharmacy to be a part of this patient's care.  Cindi CarbonMary M Helina Hullum, PharmD Clinical Pharmacist 12/07/2015 4:22 PM

## 2015-12-07 NOTE — H&P (Signed)
[employer] -  at [facility]   PATIENT NAME: [name]    MR#:  [id number]  DATE OF BIRTH:  Jun 14, 1924  DATE OF ADMISSION:  12/07/2015  PRIMARY CARE PHYSICIAN: Tonette Lederer, PA-C   REQUESTING/REFERRING PHYSICIAN: Dr. Don Perking.    CHIEF COMPLAINT:   Chief Complaint  Patient presents with  . Altered Mental Status  . Hypotension    HISTORY OF PRESENT ILLNESS:  [name]  is a 82 y.o. male with a known history of known history of Vascular disease, coronary artery disease, diabetes type 2 without complication, aortic stenosis, hypertension, hyperlipidemia, history of previous TIA, hypothyroidism Came into the hospital due to hypotension and altered mental status. Patient was recently discharge from [facility] to a skilled nursing facility and had a follow-up appointment at the podiatrist's office today. At the podiatrist's office patient was noted to be lethargic, altered and pale appearing and therefore sent to the ER for further evaluation. Upon arrival to the emergency room patient was noted to be hypotensive with systolic blood pressures in the 70s. He is afebrile, with a normal white cell count. He is also currently on IV vancomycin and Zosyn to treat osteomyelitis in the left lower extremity. Due to his severe hypotension and mental status change hospitalist services were contacted further treatment and evaluation. Patient presently denies any abdominal pain, nausea, vomiting, diarrhea, chest pain, shortness of breath or any other associated symptoms. He does say that he has had a poor appetite now for the past few days.  PAST MEDICAL HISTORY:   Past Medical History  Diagnosis Date  . Coronary artery disease   . Diabetes mellitus without complication (HCC)   . Aortic stenosis   . Hypertension   . Hyperlipidemia   . TIA (transient ischemic attack)   . Thyroid disease   . Hypothyroidism     PAST SURGICAL HISTORY:   Past Surgical  History  Procedure Laterality Date  . Appendectomy    . Hernia repair    . Knee surgery      bilateral  . Eye surgery    . Colonoscopy    . Cardiac catheterization  2010    showed a patient circumflex stent with noncritical CAD and normal right-sided pressures.  . Coronary angioplasty  01/24/2007    stent placement to the mid circumflex   . Knee surgery Left   . Peripheral vascular catheterization N/A 08/30/2015    Procedure: Abdominal Aortogram w/Lower Extremity;  Surgeon: Renford Dills, MD;  Location: [facility] INVASIVE CV LAB;  Service: Cardiovascular;  Laterality: N/A;  . Peripheral vascular catheterization  08/30/2015    Procedure: Lower Extremity Intervention;  Surgeon: Renford Dills, MD;  Location: [facility] INVASIVE CV LAB;  Service: Cardiovascular;;  . Amputation toe Left 11/05/2015    Procedure: AMPUTATION TOE;  Surgeon: Recardo Evangelist, DPM;  Location: [facility] ORS;  Service: Podiatry;  Laterality: Left;  . Irrigation and debridement foot Left 11/05/2015    Procedure: IRRIGATION AND DEBRIDEMENT FOOT / HEEL ULCER;  Surgeon: Recardo Evangelist, DPM;  Location: [facility] ORS;  Service: Podiatry;  Laterality: Left;  . Application of wound vac Left 11/05/2015    Procedure: APPLICATION OF WOUND VAC;  Surgeon: Recardo Evangelist, DPM;  Location: [facility] ORS;  Service: Podiatry;  Laterality: Left;  . Peripheral vascular catheterization Left 11/23/2015    Procedure: Abdominal Aortogram w/Lower Extremity;  Surgeon: Renford Dills, MD;  Location: [facility] INVASIVE CV LAB;  Service: Cardiovascular;  Laterality: Left;  . Peripheral  vascular catheterization  11/23/2015    Procedure: Lower Extremity Intervention;  Surgeon: Renford Dills, MD;  Location: [facility] INVASIVE CV LAB;  Service: Cardiovascular;;  . Irrigation and debridement foot Left 11/24/2015    Procedure: IRRIGATION AND DEBRIDEMENT FOOT;  Surgeon: Recardo Evangelist, DPM;  Location: [facility] ORS;  Service: Podiatry;  Laterality: Left;  . Peripheral vascular  catheterization Right 11/25/2015    Procedure: Lower Extremity Angiography;  Surgeon: Renford Dills, MD;  Location: [facility] INVASIVE CV LAB;  Service: Cardiovascular;  Laterality: Right;  . Esophagogastroduodenoscopy N/A 11/30/2015    Procedure: ESOPHAGOGASTRODUODENOSCOPY (EGD);  Surgeon: Charlott Rakes, MD;  Location: [facility];  Service: Endoscopy;  Laterality: N/A;  . Esophageal tear      SOCIAL HISTORY:   Social History  Substance Use Topics  . Smoking status: Never Smoker   . Smokeless tobacco: Never Used  . Alcohol Use: No    FAMILY HISTORY:   Family History  Problem Relation Age of Onset  . Heart disease Mother   . Heart attack Father 19    MI  . Heart attack Brother 39    MI    DRUG ALLERGIES:  No Known Allergies  REVIEW OF SYSTEMS:   Review of Systems  Constitutional: Negative for fever and weight loss.  HENT: Negative for congestion, nosebleeds and tinnitus.   Eyes: Negative for blurred vision, double vision and redness.  Respiratory: Negative for cough, hemoptysis and shortness of breath.   Cardiovascular: Negative for chest pain, orthopnea, leg swelling and PND.  Gastrointestinal: Negative for nausea, vomiting, abdominal pain, diarrhea and melena.  Genitourinary: Negative for dysuria, urgency and hematuria.  Musculoskeletal: Negative for joint pain and falls.  Neurological: Positive for weakness. Negative for dizziness, tingling, sensory change, focal weakness, seizures and headaches.  Endo/Heme/Allergies: Negative for polydipsia. Does not bruise/bleed easily.  Psychiatric/Behavioral: Negative for depression and memory loss. The patient is not nervous/anxious.     MEDICATIONS AT HOME:   Prior to Admission medications   Medication Sig Start Date End Date Taking? Authorizing Provider  amiodarone (PACERONE) 100 MG tablet Take 100 mg by mouth daily.   Yes Historical Provider, MD  carbidopa-levodopa (SINEMET IR) 25-100 MG per tablet Take 1 tablet by mouth  4 (four) times daily.   Yes Historical Provider, MD  colchicine 0.6 MG tablet Take 0.6 mg by mouth daily.  11/29/15 12/08/15 Yes Historical Provider, MD  feeding supplement, GLUCERNA SHAKE, (GLUCERNA SHAKE) LIQD Take 237 mLs by mouth 3 (three) times daily between meals. 11/09/15  Yes Auburn Bilberry, MD  furosemide (LASIX) 40 MG tablet Take 1 tablet (40 mg total) by mouth daily. 12/04/15  Yes Calvert Cantor, MD  heparin flush 10 UNIT/ML SOLN injection Inject 10 Units into the vein 4 (four) times daily.   Yes Historical Provider, MD  levothyroxine (SYNTHROID, LEVOTHROID) 150 MCG tablet Take 150 mcg by mouth daily before breakfast.   Yes Historical Provider, MD  misoprostol (CYTOTEC) 100 MCG tablet Take 100 mcg by mouth 4 (four) times daily.   Yes Historical Provider, MD  oxyCODONE-acetaminophen (PERCOCET/ROXICET) 5-325 MG tablet Take 1 tablet by mouth every 4 (four) hours as needed for moderate pain. 11/09/15  Yes Auburn Bilberry, MD  pantoprazole (PROTONIX) 40 MG tablet Take 1 tablet (40 mg total) by mouth 2 (two) times daily. 12/04/15  Yes Calvert Cantor, MD  piperacillin-tazobactam (ZOSYN) 3.375 GM/50ML IVPB Inject 50 mLs (3.375 g total) into the vein every 8 (eight) hours. 11/28/15 12/09/15 Yes Altamese Dilling, MD  potassium chloride (K-DUR,KLOR-CON)  10 MEQ tablet Take 10 mEq by mouth daily.    Yes Historical Provider, MD  pramipexole (MIRAPEX) 1 MG tablet Take 1 mg by mouth 4 (four) times daily.    Yes Historical Provider, MD  sitaGLIPtin (JANUVIA) 100 MG tablet Take 1 tablet (100 mg total) by mouth daily. 07/11/15  Yes Steele SizerMark A Crissman, MD  sodium chloride flush 0.9 % SOLN injection Inject 10 mLs into the vein 4 (four) times daily.   Yes Historical Provider, MD  sucralfate (CARAFATE) 1 g tablet Take 1 tablet (1 g total) by mouth 4 (four) times daily -  with meals and at bedtime. 12/04/15  Yes Calvert CantorSaima Rizwan, MD  tamsulosin (FLOMAX) 0.4 MG CAPS capsule Take 1 capsule (0.4 mg total) by mouth daily after supper. 10/17/15   Yes Steele SizerMark A Crissman, MD  vancomycin (VANCOCIN) 1-5 GM/200ML-% SOLN Inject 200 mLs (1,000 mg total) into the vein daily. 11/28/15 12/09/15 Yes Altamese DillingVaibhavkumar Vachhani, MD      VITAL SIGNS:  Blood pressure 127/74, pulse 72, temperature 97.5 F (36.4 C), temperature source Axillary, resp. rate 21, weight 78.472 kg (173 lb), SpO2 98 %.  PHYSICAL EXAMINATION:  Physical Exam  GENERAL:  82 y.o.-year-old patient lying in the bed in no acute distress.  EYES: Pupils equal, round, reactive to light and accommodation. No scleral icterus. Extraocular muscles intact.  HEENT: Head atraumatic, normocephalic. Oropharynx and nasopharynx clear. No oropharyngeal erythema, moist oral mucosa  NECK:  Supple, no jugular venous distention. No thyroid enlargement, no tenderness.  LUNGS: Normal breath sounds bilaterally, no wheezing, rales, rhonchi. No use of accessory muscles of respiration.  CARDIOVASCULAR: S1, S2 RRR. No murmurs, rubs, gallops, clicks.  ABDOMEN: Soft, nontender, nondistended. Bowel sounds present. No organomegaly or mass.  EXTREMITIES: No pedal edema, clubbing b/l.  Wound Vac to the LLE.  Faint pulses b/l.    NEUROLOGIC: Cranial nerves II through XII are intact. No focal Motor or sensory deficits appreciated b/l.  Globally weak. PSYCHIATRIC: The patient is alert and oriented x 3.   SKIN: No obvious rash, lesion, or ulcer.   LABORATORY PANEL:   CBC  Recent Labs Lab 12/07/15 1136  WBC 10.2  HGB 12.6*  HCT 36.2*  PLT 253   ------------------------------------------------------------------------------------------------------------------  Chemistries   Recent Labs Lab 12/07/15 1136  NA 139  K 2.9*  CL 107  CO2 21*  GLUCOSE 164*  BUN 11  CREATININE 1.41*  CALCIUM 8.6*  AST 12*  ALT <5*  ALKPHOS 125  BILITOT 1.2   ------------------------------------------------------------------------------------------------------------------  Cardiac Enzymes  Recent Labs Lab  12/07/15 1136  TROPONINI <0.03   ------------------------------------------------------------------------------------------------------------------  RADIOLOGY:  Ct Abdomen Pelvis Wo Contrast  12/07/2015  CLINICAL DATA:  Hypotension and slurred speech with nausea and fusion. Recent diagnosis of esophageal tear. EXAM: CT ABDOMEN AND PELVIS WITHOUT CONTRAST TECHNIQUE: Multidetector CT imaging of the abdomen and pelvis was performed following the standard protocol without IV contrast. COMPARISON:  07/28/2009 FINDINGS: Lower chest: Right lower lobe interstitial and airspace disease raises suspicion of pneumonia. Small bilateral pleural effusions are evident. Hepatobiliary: No focal abnormality in the liver on this study without intravenous contrast. No evidence of hepatomegaly. Multiple calcified stones are seen in the gallbladder. No intrahepatic or extrahepatic biliary dilation. Pancreas: Pancreas is diffusely fatty replaced. Multi a lobular cystic lesion in the tail the pancreas has progressed in the interval. Spleen: No splenomegaly. No focal mass lesion. Adrenals/Urinary Tract: No adrenal nodule or mass. Right kidney is atrophic with multiple low-density lesions compatible with cysts. Multiple  cysts are noted in the left kidney with lower pole scarring. No hydroureteronephrosis. The urinary bladder appears normal for the degree of distention. Stomach/Bowel: Stomach is nondistended. No gastric wall thickening. No evidence of outlet obstruction. Duodenum is normally positioned as is the ligament of Treitz. No small bowel wall thickening. No small bowel dilatation. The terminal ileum is normal. Diverticular changes are noted in the left colon without evidence of diverticulitis. Vascular/Lymphatic: There is abdominal aortic atherosclerosis without aneurysm. There is no gastrohepatic or hepatoduodenal ligament lymphadenopathy. No intraperitoneal or retroperitoneal lymphadenopathy. No pelvic sidewall  lymphadenopathy. Reproductive: Prostate gland appears enlarged with probable TURP defect. Other: No intraperitoneal free fluid. Musculoskeletal: Stable appearance of trabecular coarsening in the left hemipelvis, likely a reflection of Paget's disease. IMPRESSION: 1. Right lower lobe interstitial and airspace disease suggests pneumonia. 2. Tiny bilateral pleural effusions. 3. Right renal atrophy with incompletely characterized cystic lesions in the kidneys bilaterally. No hydronephrosis. 4. Multi lobular cystic lesion in the tail the pancreas measuring up to 4.7 x 1.6 cm in total. This has progressed slightly since 07/28/2009. Electronically Signed   By: Kennith Center M.D.   On: 12/07/2015 13:56   Dg Ankle 2 Views Left  12/07/2015  CLINICAL DATA:  Evaluate for osteomyelitis.  Hypotension EXAM: LEFT ANKLE - 2 VIEW COMPARISON:  07/22/2015 FINDINGS: Wound VAC along the lateral ankle. No soft tissue emphysema or signs of acute osteomyelitis. Negative for ankle joint effusion. Osteopenia and atherosclerosis. IMPRESSION: Negative for osteomyelitis or soft tissue emphysema. Electronically Signed   By: Marnee Spring M.D.   On: 12/07/2015 13:05   Dg Ankle 2 Views Right  12/07/2015  CLINICAL DATA:  Pain. EXAM: RIGHT ANKLE - 2 VIEW COMPARISON:  None. FINDINGS: Two-view exam shows no gross fracture or dislocation. Orientation of the lateral malleolus to the ankle mortise cannot be discerned on this two-view oblique study. No worrisome lytic or sclerotic osseous abnormality. IMPRESSION: Limited study without gross bony abnormality. Electronically Signed   By: Kennith Center M.D.   On: 12/07/2015 13:03   Dg Chest Portable 1 View  12/07/2015  CLINICAL DATA:  Sepsis.  Hypotension EXAM: PORTABLE CHEST 1 VIEW COMPARISON:  11/30/2015 FINDINGS: Right upper extremity PICC with tip at the upper right atrium. Low volume chest with chronic elevation of the right diaphragm. Curvilinear lucency at the right base favors atelectasis,  pneumoperitoneum not excluded. There is no edema, consolidation, effusion, or pneumothorax. These results were called by telephone at the time of interpretation on 12/07/2015 at 12:59 pm to Dr. Nita Sickle , who verbally acknowledged these results. IMPRESSION: 1. Right basilar lucency could be from atelectasis or pneumoperitoneum. If abdominal pain, recommended CT. 2. No evidence of acute cardiopulmonary disease. Electronically Signed   By: Marnee Spring M.D.   On: 12/07/2015 13:00   Dg Foot 2 Views Left  12/07/2015  CLINICAL DATA:  Hypotension.  Evaluate for osteomyelitis. EXAM: LEFT FOOT - 2 VIEW COMPARISON:  11/04/2015 FINDINGS: Interval transmetatarsal amputation to the first ray. Remote distal fifth metatarsal resection. No generalized soft tissue emphysema. Unremarkable appearance of the osteotomies. Osteopenia and atherosclerosis. IMPRESSION: Unremarkable appearance of interval first ray transmetatarsal amputation. No indication of acute osteomyelitis. Electronically Signed   By: Marnee Spring M.D.   On: 12/07/2015 13:04   Dg Foot 2 Views Right  12/07/2015  CLINICAL DATA:  Pain. EXAM: RIGHT FOOT - 2 VIEW COMPARISON:  None. FINDINGS: Limited two views study with superimposition of bony anatomy. No gross fracture evident. No evidence for frank dislocation.  Bones are demineralized. No worrisome lytic or sclerotic osseous abnormality evident. IMPRESSION: Negative. Electronically Signed   By: Kennith CenterEric  Mansell M.D.   On: 12/07/2015 13:04     IMPRESSION AND PLAN:   82 year old male with past medical history of peripheral vascular disease, requiring artery disease, history of aortic stenosis, diabetes type 2 without complication, hypertension, hyperlipidemia, history of previous TIA, hypothyroidism presents to the hospital due to altered mental status and noted to be hypotensive.  1. Altered mental status-this was secondary to hypotension. -As patient's blood pressure has improved his mental status  is not back to baseline. -No evidence of any acute neurologic source presently.  2. Hypotension-I suspect this is likely related to dehydration. Clinical suspicion for sepsis as low as patient was already on empiric antibiotics with vancomycin and Zosyn. Patient's chest x-ray does show a pneumonia but clinically patient has no acute symptoms, and he is afebrile. -I will continue his vancomycin, Zosyn. Follow hemodynamics. Hold Lasix. Hydrate with IV fluids.  3. Diabetes type 2 without complication-continue sliding scale insulin, Tradjenta.  4. Hypothyroidism-continue Synthroid.  5. History of Parkinson's disease-continue Sinemet.  6. GERD-continue Protonix.  7. Left lower extremity ulcer-x-ray showing no evidence of acute osteomyelitis. Had recent angiogram with angioplasty done. Also had debridement done by podiatry and has a wound VAC on presently. -Continue empiric antibiotics with vancomycin and Zosyn.  8. History of atrial fibrillation-rate controlled. Continue amiodarone.   All the records are reviewed and case discussed with ED provider. Management plans discussed with the patient, family and they are in agreement.  CODE STATUS: Partial.   TOTAL TIME TAKING CARE OF THIS PATIENT: 45 minutes.    Houston SirenSAINANI,Andri Prestia J M.D on 12/07/2015 at 2:49 PM  Between 7am to 6pm - Pager - [phone]  After 6pm go to [email or web address] - password [email or web address]  [facility]  Office  [phone]  CC: Primary care physician; Tonette LedererKIMBERLY A STEGMAYER, PA-C

## 2015-12-07 NOTE — ED Notes (Signed)
Code  Sepsis  Called  To  [facility] 

## 2015-12-07 NOTE — ED Notes (Signed)
Pt presents with low blood pressure, slurred speech, nausea, confusion and recent diagnosis of esophageal tear.

## 2015-12-07 NOTE — Progress Notes (Signed)
Location:   [place]   Place of Service:   [facility] Provider:  Toni Arthurs, NP-C  Sela Hua, PA-C  Patient Care Team: Sela Hua, PA-C as PCP - General (Physician Assistant) Katha Cabal, MD (Vascular Surgery) Minna Merritts, MD as Consulting Physician (Cardiology) Beverly Gust, MD (Unknown Physician Specialty) Anabel Bene, MD as Referring Physician (Neurology) Duanne Guess, PA-C as Physician Assistant (Orthopedic Surgery) Minna Merritts, MD as Consulting Physician (Cardiology)  Extended Emergency Contact Information Primary Emergency Contact: Wells Guiles of [place] Phone: [phone] Relation: Daughter Secondary Emergency Contact: Leim Fabry [place] of [place] Phone: [phone] Mobile Phone: [phone] Relation: Son  Code Status:  DNR Goals of care: Advanced Directive information Advanced Directives 12/07/2015  Does patient have an advance directive? Yes  Type of Paramedic of [place];Living will;Out of facility DNR (pink MOST or yellow form)  Does patient want to make changes to advanced directive? -  Copy of advanced directive(s) in chart? -  Pre-existing out of facility DNR order (yellow form or pink MOST form) -     Chief Complaint  Patient presents with  . Hematemesis    HPI:  Late entry- exam performed 11/29/15 @ 1200 Pt is a 82 y.o. male seen today for an acute visit for hematemesis. Nursing reports episode this morning of bright red blood emesis with clots. Pt denies nausea. Reports the spontaneous vomiting occurs intermittently at home, without warning or nausea. Pt does not appear ill or in distress. Denies melena, abdominal pain. VSS   Past Medical History  Diagnosis Date  . Coronary artery disease   . Diabetes mellitus without complication ([place])   . Aortic stenosis   . Hypertension   . Hyperlipidemia   . TIA (transient ischemic attack)     . Thyroid disease   . Hypothyroidism    Past Surgical History  Procedure Laterality Date  . Appendectomy    . Hernia repair    . Knee surgery      bilateral  . Eye surgery    . Colonoscopy    . Cardiac catheterization  2010    showed a patient circumflex stent with noncritical CAD and normal right-sided pressures.  . Coronary angioplasty  01/24/2007    stent placement to the mid circumflex   . Knee surgery Left   . Peripheral vascular catheterization N/A 08/30/2015    Procedure: Abdominal Aortogram w/Lower Extremity;  Surgeon: Katha Cabal, MD;  Location: [facility] [facility];  Service: Cardiovascular;  Laterality: N/A;  . Peripheral vascular catheterization  08/30/2015    Procedure: Lower Extremity Intervention;  Surgeon: Katha Cabal, MD;  Location: [place] [facility];  Service: Cardiovascular;;  . Amputation toe Left 11/05/2015    Procedure: AMPUTATION TOE;  Surgeon: Albertine Patricia, DPM;  Location: [facility];  Service: Podiatry;  Laterality: Left;  . Irrigation and debridement foot Left 11/05/2015    Procedure: IRRIGATION AND DEBRIDEMENT FOOT / HEEL ULCER;  Surgeon: Albertine Patricia, DPM;  Location: [facility];  Service: Podiatry;  Laterality: Left;  . Application of wound vac Left 11/05/2015    Procedure: APPLICATION OF WOUND VAC;  Surgeon: Albertine Patricia, DPM;  Location: [facility];  Service: Podiatry;  Laterality: Left;  . Peripheral vascular catheterization Left 11/23/2015    Procedure: Abdominal Aortogram w/Lower Extremity;  Surgeon: Katha Cabal, MD;  Location: [place] [facility];  Service: Cardiovascular;  Laterality: Left;  . Peripheral vascular  catheterization  11/23/2015    Procedure: Lower Extremity Intervention;  Surgeon: Katha Cabal, MD;  Location: [place] [facility];  Service: Cardiovascular;;  . Irrigation and debridement foot Left 11/24/2015    Procedure: IRRIGATION AND DEBRIDEMENT FOOT;  Surgeon: Albertine Patricia, DPM;  Location: [facility];  Service:  Podiatry;  Laterality: Left;  . Peripheral vascular catheterization Right 11/25/2015    Procedure: Lower Extremity Angiography;  Surgeon: Katha Cabal, MD;  Location: [place] [facility];  Service: Cardiovascular;  Laterality: Right;  . Esophagogastroduodenoscopy N/A 11/30/2015    Procedure: ESOPHAGOGASTRODUODENOSCOPY (EGD);  Surgeon: Wilford Corner, MD;  Location: [facility];  Service: Endoscopy;  Laterality: N/A;  . Esophageal tear      No Known Allergies    Medication List    Notice    This visit is during an admission. Changes to the med list made in this visit will be reflected in the After Visit Summary of the admission.      Review of Systems  Constitutional: Negative.   HENT: Negative.   Eyes: Negative.   Respiratory: Negative.   Cardiovascular: Negative.   Gastrointestinal: Positive for vomiting. Negative for nausea, abdominal pain, diarrhea, blood in stool and abdominal distention.  Genitourinary: Negative.   Musculoskeletal: Negative.   Skin: Positive for wound (Ostemyelitis of B-heel wounds. Wound vac in place. Prevelon boots in place).  Neurological: Negative.   Psychiatric/Behavioral: Negative.   All other systems reviewed and are negative.   Immunization History  Administered Date(s) Administered  . Influenza-Unspecified 03/04/2014, 03/16/2015  . Pneumococcal-Unspecified 01/11/2002, 05/10/2010  . Td 06/18/2005  . Zoster 02/06/2007   Pertinent  Health Maintenance Due  Topic Date Due  . OPHTHALMOLOGY EXAM  01/12/1935  . PNA vac Low Risk Adult (2 of 2 - PCV13) 05/11/2011  . INFLUENZA VACCINE  01/03/2016  . HEMOGLOBIN A1C  05/23/2016  . FOOT EXAM  09/26/2016  . URINE MICROALBUMIN  09/26/2016   Fall Risk  09/27/2015  Falls in the past year? Exclusion - non ambulatory   Functional Status Survey:    There were no vitals filed for this visit. There is no weight on file to calculate BMI. Physical Exam  Constitutional: He is oriented to person,  place, and time. Vital signs are normal. He appears well-developed and well-nourished. He is cooperative. He does not appear ill. No distress.  Eyes: Conjunctivae, EOM and lids are normal. Pupils are equal, round, and reactive to light.  Neck: Trachea normal. No JVD present.  Cardiovascular: Normal rate, regular rhythm, normal heart sounds, intact distal pulses and normal pulses.  Exam reveals no gallop, no distant heart sounds and no friction rub.   No murmur heard. Pulmonary/Chest: Effort normal and breath sounds normal. No accessory muscle usage. No respiratory distress.  Abdominal: Soft. Normal appearance and bowel sounds are normal. He exhibits no distension, no abdominal bruit, no ascites and no pulsatile midline mass. There is generalized tenderness. There is no rigidity, no rebound and no guarding.  Neurological: He is alert and oriented to person, place, and time. He has normal strength.  Skin: Skin is warm and intact. He is not diaphoretic. No pallor.  B-heel wounds with osteomyelitis, wound vac draining BRB. Sacral excoriation  Psychiatric: He has a normal mood and affect. His speech is normal and behavior is normal.  Nursing note and vitals reviewed.   Labs reviewed:  Recent Labs  11/23/15 0541 11/25/15 0500  12/04/15 0334 12/05/15 1425 12/07/15 1136  NA 139 138  < >  141 140 139  K 3.8 3.4*  < > 3.0* 3.3* 2.9*  CL 103 108  < > 113* 111 107  CO2 28 26  < > 23 24 21*  GLUCOSE 157* 118*  < > 97 109* 164*  BUN 20 17  < > _0 CREATININE 1.18 1.03  < > 1.08 1.01 1.41*  CALCIUM 8.7* 8.2*  < > 8.1* 8.0* 8.6*  MG 1.9 1.8  --   --   --   --   < > = values in this interval not displayed.  Recent Labs  11/29/15 1000 11/29/15 1847 12/07/15 1136  AST 13* 14* 12*  ALT 8* 9* <5*  ALKPHOS 147* 151* 125  BILITOT 0.9 0.6 1.2  PROT 5.2* 5.6* 5.9*  ALBUMIN 2.3* 2.6* 2.8*    Recent Labs  11/21/15 1236  11/29/15 1000 11/29/15 1847  12/03/15 0430 12/04/15 0334  12/07/15 1136  WBC 9.7  < > 8.8 11.1*  < > 6.7 6.5 10.2  NEUTROABS 7.5*  --  6.7* 8.2*  --   --   --   --   HGB 11.8*  < > 8.8* 9.1*  < > 9.2* 9.4* 12.6*  HCT 35.6*  < > 25.4* 27.2*  < > 27.6* 28.1* 36.2*  MCV 90.2  < > 88.8 90.6  < > 90.8 90.9 89.5  PLT 420  < > 222 266  < > 212 217 253  < > = values in this interval not displayed. Lab Results  Component Value Date   TSH 3.080 09/27/2015   Lab Results  Component Value Date   HGBA1C 7.5* 11/22/2015   Lab Results  Component Value Date   CHOL 146 09/27/2015   HDL 23* 09/27/2015   LDLCALC 73 09/27/2015   TRIG 252* 09/27/2015    Significant Diagnostic Results in last 30 days:  Ct Abdomen Pelvis Wo Contrast  12/07/2015  CLINICAL DATA:  Hypotension and slurred speech with nausea and fusion. Recent diagnosis of esophageal tear. EXAM: CT ABDOMEN AND PELVIS WITHOUT CONTRAST TECHNIQUE: Multidetector CT imaging of the abdomen and pelvis was performed following the standard protocol without IV contrast. COMPARISON:  07/28/2009 FINDINGS: Lower chest: Right lower lobe interstitial and airspace disease raises suspicion of pneumonia. Small bilateral pleural effusions are evident. Hepatobiliary: No focal abnormality in the liver on this study without intravenous contrast. No evidence of hepatomegaly. Multiple calcified stones are seen in the gallbladder. No intrahepatic or extrahepatic biliary dilation. Pancreas: Pancreas is diffusely fatty replaced. Multi a lobular cystic lesion in the tail the pancreas has progressed in the interval. Spleen: No splenomegaly. No focal mass lesion. Adrenals/Urinary Tract: No adrenal nodule or mass. Right kidney is atrophic with multiple low-density lesions compatible with cysts. Multiple cysts are noted in the left kidney with lower pole scarring. No hydroureteronephrosis. The urinary bladder appears normal for the degree of distention. Stomach/Bowel: Stomach is nondistended. No gastric wall thickening. No evidence of  outlet obstruction. Duodenum is normally positioned as is the ligament of Treitz. No small bowel wall thickening. No small bowel dilatation. The terminal ileum is normal. Diverticular changes are noted in the left colon without evidence of diverticulitis. Vascular/Lymphatic: There is abdominal aortic atherosclerosis without aneurysm. There is no gastrohepatic or hepatoduodenal ligament lymphadenopathy. No intraperitoneal or retroperitoneal lymphadenopathy. No pelvic sidewall lymphadenopathy. Reproductive: Prostate gland appears enlarged with probable TURP defect. Other: No intraperitoneal free fluid. Musculoskeletal: Stable appearance of trabecular coarsening in the left hemipelvis, likely a reflection of Paget's  disease. IMPRESSION: 1. Right lower lobe interstitial and airspace disease suggests pneumonia. 2. Tiny bilateral pleural effusions. 3. Right renal atrophy with incompletely characterized cystic lesions in the kidneys bilaterally. No hydronephrosis. 4. Multi lobular cystic lesion in the tail the pancreas measuring up to 4.7 x 1.6 cm in total. This has progressed slightly since 07/28/2009. Electronically Signed   By: Misty Stanley M.D.   On: 12/07/2015 13:56   Dg Chest 1 View  11/08/2015  CLINICAL DATA:  Status post PICC line placement EXAM: CHEST 1 VIEW COMPARISON:  Not 07/14/2015 FINDINGS: Cardiac shadow is within normal limits. Elevation of the right hemidiaphragm is again seen. A new right PICC line is noted with the tip projecting at the cavoatrial junction. No other focal abnormality is seen. No bony abnormality is noted. IMPRESSION: PICC line at the SVC/ RA junction. Electronically Signed   By: Inez Catalina M.D.   On: 11/08/2015 21:53   Dg Ankle 2 Views Left  12/07/2015  CLINICAL DATA:  Evaluate for osteomyelitis.  Hypotension EXAM: LEFT ANKLE - 2 VIEW COMPARISON:  07/22/2015 FINDINGS: Wound VAC along the lateral ankle. No soft tissue emphysema or signs of acute osteomyelitis. Negative for ankle  joint effusion. Osteopenia and atherosclerosis. IMPRESSION: Negative for osteomyelitis or soft tissue emphysema. Electronically Signed   By: Monte Fantasia M.D.   On: 12/07/2015 13:05   Dg Ankle 2 Views Right  12/07/2015  CLINICAL DATA:  Pain. EXAM: RIGHT ANKLE - 2 VIEW COMPARISON:  None. FINDINGS: Two-view exam shows no gross fracture or dislocation. Orientation of the lateral malleolus to the ankle mortise cannot be discerned on this two-view oblique study. No worrisome lytic or sclerotic osseous abnormality. IMPRESSION: Limited study without gross bony abnormality. Electronically Signed   By: Misty Stanley M.D.   On: 12/07/2015 13:03   Dg Chest Portable 1 View  12/07/2015  CLINICAL DATA:  Sepsis.  Hypotension EXAM: PORTABLE CHEST 1 VIEW COMPARISON:  11/30/2015 FINDINGS: Right upper extremity PICC with tip at the upper right atrium. Low volume chest with chronic elevation of the right diaphragm. Curvilinear lucency at the right base favors atelectasis, pneumoperitoneum not excluded. There is no edema, consolidation, effusion, or pneumothorax. These results were called by telephone at the time of interpretation on 12/07/2015 at 12:59 pm to Dr. Rudene Re , who verbally acknowledged these results. IMPRESSION: 1. Right basilar lucency could be from atelectasis or pneumoperitoneum. If abdominal pain, recommended CT. 2. No evidence of acute cardiopulmonary disease. Electronically Signed   By: Monte Fantasia M.D.   On: 12/07/2015 13:00   Dg Chest Port 1 View  11/30/2015  CLINICAL DATA:  GI bleed. EXAM: PORTABLE CHEST 1 VIEW COMPARISON:  November 08, 2015 FINDINGS: The right PICC line is stable. There is elevation of the right hemidiaphragm. Increased interstitial markings suggest mild edema. The heart, hila, mediastinum, lungs, and pleura are otherwise unchanged. IMPRESSION: Mild edema. Electronically Signed   By: Dorise Bullion III M.D   On: 11/30/2015 01:32   Dg Foot 2 Views Left  12/07/2015  CLINICAL  DATA:  Hypotension.  Evaluate for osteomyelitis. EXAM: LEFT FOOT - 2 VIEW COMPARISON:  11/04/2015 FINDINGS: Interval transmetatarsal amputation to the first ray. Remote distal fifth metatarsal resection. No generalized soft tissue emphysema. Unremarkable appearance of the osteotomies. Osteopenia and atherosclerosis. IMPRESSION: Unremarkable appearance of interval first ray transmetatarsal amputation. No indication of acute osteomyelitis. Electronically Signed   By: Monte Fantasia M.D.   On: 12/07/2015 13:04   Dg Foot 2 Views Right  12/07/2015  CLINICAL DATA:  Pain. EXAM: RIGHT FOOT - 2 VIEW COMPARISON:  None. FINDINGS: Limited two views study with superimposition of bony anatomy. No gross fracture evident. No evidence for frank dislocation. Bones are demineralized. No worrisome lytic or sclerotic osseous abnormality evident. IMPRESSION: Negative. Electronically Signed   By: Misty Stanley M.D.   On: 12/07/2015 13:04    Assessment/Plan 1. Hematemesis without nausea  Abdominal U/S  CBC, Met C  DC ASA, Plavix  Cytotec 111mg po QID  Pepcid 20 mg IV Q 12 hours x 7 doses  Prostat AWH- 30 mL po BID wound healing  Monitor closely for increased sx   Family/ staff Communication:   Total Time: 90 minutes  Documentation: 50 minutes (including research and planning)  Face to Face: 30 minutes  Family/Phone: Discussed plan with pt, pt agreeable  Labs/tests ordered:  CBC, Met C, Abd U/S  **called urgently by nursing at 1600. Pt continues to vomit large amounts of blood- amount and frequency increasing. Nursing estimates >300 mL bloody emesis this afternoon. Ordered nursing to send to ED to eval and treat.**   SVikki Ports NP-C [facility] [phone] [place] [id number] Phone (Mon-Fri 8am-5pm):  [phone] Call:  [phone]& follow prompts after 5pm & weekends Office Phone:  [phone] Fax:   [phone]

## 2015-12-08 DIAGNOSIS — L8990 Pressure ulcer of unspecified site, unspecified stage: Secondary | ICD-10-CM | POA: Insufficient documentation

## 2015-12-08 LAB — VANCOMYCIN, RANDOM: VANCOMYCIN RM: 11

## 2015-12-08 LAB — BASIC METABOLIC PANEL
ANION GAP: 3 — AB (ref 5–15)
BUN: 9 mg/dL (ref 6–20)
CALCIUM: 7.9 mg/dL — AB (ref 8.9–10.3)
CO2: 23 mmol/L (ref 22–32)
CREATININE: 1.03 mg/dL (ref 0.61–1.24)
Chloride: 115 mmol/L — ABNORMAL HIGH (ref 101–111)
GLUCOSE: 85 mg/dL (ref 65–99)
Potassium: 3.5 mmol/L (ref 3.5–5.1)
Sodium: 141 mmol/L (ref 135–145)

## 2015-12-08 LAB — CBC
HCT: 29 % — ABNORMAL LOW (ref 40.0–52.0)
HEMOGLOBIN: 9.8 g/dL — AB (ref 13.0–18.0)
MCH: 30.7 pg (ref 26.0–34.0)
MCHC: 33.8 g/dL (ref 32.0–36.0)
MCV: 90.8 fL (ref 80.0–100.0)
PLATELETS: 169 10*3/uL (ref 150–440)
RBC: 3.2 MIL/uL — ABNORMAL LOW (ref 4.40–5.90)
RDW: 16.5 % — ABNORMAL HIGH (ref 11.5–14.5)
WBC: 4.6 10*3/uL (ref 3.8–10.6)

## 2015-12-08 LAB — TYPE AND SCREEN
ABO/RH(D): A POS
Antibody Screen: NEGATIVE
DONOR AG TYPE: NEGATIVE
Donor AG Type: NEGATIVE
UNIT DIVISION: 0
UNIT DIVISION: 0

## 2015-12-08 LAB — URINE CULTURE: Culture: NO GROWTH

## 2015-12-08 LAB — MAGNESIUM: MAGNESIUM: 1.6 mg/dL — AB (ref 1.7–2.4)

## 2015-12-08 LAB — PREPARE RBC (CROSSMATCH)

## 2015-12-08 MED ORDER — MAGNESIUM OXIDE 400 (241.3 MG) MG PO TABS
800.0000 mg | ORAL_TABLET | Freq: Once | ORAL | Status: AC
  Administered 2015-12-08: 14:00:00 800 mg via ORAL
  Filled 2015-12-08: qty 2

## 2015-12-08 MED ORDER — VANCOMYCIN HCL 10 G IV SOLR
1250.0000 mg | INTRAVENOUS | Status: DC
  Administered 2015-12-08: 1250 mg via INTRAVENOUS
  Filled 2015-12-08: qty 1250

## 2015-12-08 NOTE — Discharge Planning (Signed)
IV removed x2. PICC to remain intact to continue to receive antibiotics at facility.

## 2015-12-08 NOTE — Plan of Care (Signed)
Patient's wound vac battery flashing, but not charger found.  Contacted [facility] which confirmed charger was left at facility.  They agreed to transport charger to hospital.

## 2015-12-08 NOTE — Discharge Planning (Signed)
Son returned to room and was informed of current plan to return to facility.  Family agreed to the plan and understands that patient is currently stable.

## 2015-12-08 NOTE — Discharge Planning (Signed)
RN assessment and VS revealed stability for DC to facility. Packet and  WV to be sent to facility. Report called and s/w Chesley NoonJenny Johnson, LPN.- told of andx needed to be given at facility and next times of administration. EMS contacted to transport to rm 224.  Family also aware of DC to facility.

## 2015-12-08 NOTE — Clinical Social Work Note (Signed)
Clinical Social Work Assessment  Patient Details  Name: [name] MRN: [id number] Date of Birth: [birth year]  Date of referral:  12/08/15               Reason for consult:  Facility Placement                Permission sought to share information with:  Family Supports Permission granted to share information::  Yes, Verbal Permission Granted  Name::     [name]  Relationship::  son   Contact Information:  [phone]  Housing/Transportation Living arrangements for the past 2 months:  [place], [place] of Information:  Patient, Adult Children Patient Interpreter Needed:  None Criminal Activity/Legal Involvement Pertinent to Current Situation/Hospitalization:  Yes Significant Relationships:   Adult Children Lives with:  Facility Resident Do you feel safe going back to the place where you live?  Yes Need for family participation in patient care:  Yes (Comment)  Care giving concerns:  No care giving needs identified.   Social Worker assessment / plan:  CSW met with pt and family to address consult. Pt [facility] from [facility] where he is STR. Pt is under OBS. New Josem Kaufmann has been obtained ([id number]). Facility is ready to admit pt as they have received discharge summary. Pt and son are agreeable to discharge plan. RN called report and EMS will provide transportation. CSW is signing off as no further needs identified.   Employment status:  Retired Forensic scientist:  [employer] PT Recommendations:  Not assessed at this time Information / Referral to community resources:   Statistician)  Patient/Family's Response to care:  Pt and son were appreciative of CSW support.   Patient/Family's Understanding of and Emotional Response to Diagnosis, Current Treatment, and Prognosis:  Pt is aware of discharging to SNF for continued SNF.   Emotional Assessment Appearance:  Appears stated age Attitude/Demeanor/Rapport:  Other  (Appropriate) Affect (typically observed):  Accepting, Adaptable, Pleasant Orientation:  Oriented to Self, Oriented to Place, Oriented to  Time, Oriented to Situation Alcohol / Substance use:  Never Used Psych involvement (Current and /or in the community):  No (Comment)  Discharge Needs  Concerns to be addressed:  No discharge needs identified Readmission within the last 30 days:  Yes Current discharge risk:  None Barriers to Discharge:  No Barriers Identified   Darden Dates, LCSW 12/08/2015, 4:50 PM

## 2015-12-08 NOTE — Care Management Obs Status (Signed)
MEDICARE OBSERVATION STATUS NOTIFICATION   Patient Details  Name: [name] MRN: [id number] Date of Birth: November 30, 1924   Medicare Observation Status Notification Given:  Yes    Gwenette GreetBrenda S Sherryl Valido, RN 12/08/2015, 9:55 AM

## 2015-12-08 NOTE — Progress Notes (Signed)
Pharmacy Antibiotic Note  [name] is a 82 y.o. male admitted on 12/07/2015 with osteomyelitis.  Pharmacy has been consulted for vancomycin dosing.  Patient was admitted on vancomycin and piperacillin/tazobactam for osteomyelitis.   Plan: Patient continued on outpatient regimen of vancomycin 1gm IV Q24H  Vancomycin random 24h after last dose = 3111mcg/ml. Will increase dose to 1250mg  IV Q24H to target trough of 15-4620mcg/ml for osteomyelitis.  Patient also has orders for piperacillin/tazobactam 3.375 g IV q8h EI  Height: 6' (182.9 cm) Weight: 174 lb (78.926 kg) IBW/kg (Calculated) : 77.6  Temp (24hrs), Avg:98 F (36.7 C), Min:97.5 F (36.4 C), Max:98.9 F (37.2 C)   Recent Labs Lab 12/02/15 1705 12/03/15 0430 12/04/15 0334 12/05/15 1425 12/07/15 1136 12/07/15 1145 12/08/15 0500 12/08/15 1230  WBC 6.3 6.7 6.5  --  10.2  --  4.6  --   CREATININE  --   --  1.08 1.01 1.41*  --  1.03  --   LATICACIDVEN  --   --   --   --   --  1.9  --   --   VANCORANDOM  --   --   --   --   --   --   --  11    Estimated Creatinine Clearance: 54.4 mL/min (by C-G formula based on Cr of 1.03).    No Known Allergies  Antimicrobials this admission: vancomycin  Piperacillin/tazobactam   Dose adjustments this admission: 7/6: Vancomycin 1gm Q24H >> 1250mg  IV Q24H  Microbiology results: 7/5 BCx: Sent 7/5 UCx: Sent   Thank you for allowing pharmacy to be a part of this patient's care.  Martyn MalayBarefoot,Tresea Heine C, PharmD Clinical Pharmacist 12/08/2015 3:10 PM

## 2015-12-08 NOTE — Care Management (Addendum)
Admitted to [facility] under observation status with the diagnosis of hypotension. Lives with son Onalee HuaDavid [phone]). [facility] 12[facility]04/15. Last seen Dr. Vonita MossMark Crissman 60 days ago. Usually gets around in a wheelchair. [facility].  Son is at the bedside. Gwenette GreetBrenda S Treshaun Carrico RN MSN CCM Care management [phone]

## 2015-12-08 NOTE — Discharge Planning (Signed)
[facility] called to inform of plans to Discharge back to facility today.  Suggested not to have representative remove wound vac, since returning to facility today.  [facility] and also stated they'd  not send power source either (at this point).

## 2015-12-08 NOTE — Discharge Instructions (Signed)
°  DIET:  °Regular diet ° °DISCHARGE CONDITION:  °Stable ° °ACTIVITY:  °Activity as tolerated ° °OXYGEN:  °Home Oxygen: No. °  °Oxygen Delivery: room air ° °DISCHARGE LOCATION:  °nursing home  ° °If you experience worsening of your admission symptoms, develop shortness of breath, life threatening emergency, suicidal or homicidal thoughts you must seek medical attention immediately by calling 911 or calling your MD immediately  if symptoms less severe. ° °You Must read complete instructions/literature along with all the possible adverse reactions/side effects for all the Medicines you take and that have been prescribed to you. Take any new Medicines after you have completely understood and accpet all the possible adverse reactions/side effects.  ° °Please note ° °You were cared for by a hospitalist during your hospital stay. If you have any questions about your discharge medications or the care you received while you were in the hospital after you are discharged, you can call the unit and asked to speak with the hospitalist on call if the hospitalist that took care of you is not available. Once you are discharged, your primary care physician will handle any further medical issues. Please note that NO REFILLS for any discharge medications will be authorized once you are discharged, as it is imperative that you return to your primary care physician (or establish a relationship with a primary care physician if you do not have one) for your aftercare needs so that they can reassess your need for medications and monitor your lab values. ° ° ° °

## 2015-12-08 NOTE — [place] FL2 (Signed)
[place] MEDICAID FL2 LEVEL OF CARE SCREENING TOOL     IDENTIFICATION  Patient Name: [name] Birthdate: Jan 26, 1925 Sex: male Admission Date (Current Location): 12/07/2015  [place]   Facility and Address:  [facility], [address], [place], [place] [address]      Provider Number: [id number]  Attending Physician Name and Address:  Milagros LollSrikar Sudini, MD  Relative Name and Phone Number:       Current Level of Care: Hospital Recommended Level of Care: Skilled Nursing Facility Prior Approval Number:    Date Approved/Denied:   PASRR Number: [id number]  Discharge Plan: SNF    Current Diagnoses: Patient Active Problem List   Diagnosis Date Noted  . Pressure ulcer 12/08/2015  . Hypotension 12/07/2015  . Aortic heart valve narrowing 12/07/2015  . Mallory-Weiss tear 12/04/2015  . Hematemesis 11/30/2015  . Great toe amputation status (HCC), L foot , recent 11/30/2015  . Acute blood loss anemia 11/30/2015  . Ulcer of left heel (HCC), recent I&D 11/30/2015  . Non-insulin dependent type 2 diabetes mellitus (HCC) 11/30/2015  . PVD (peripheral vascular disease), recent intervention [facility] last week 11/30/2015  . DNR (do not resuscitate) 11/30/2015  . Heel ulcer (HCC) 11/21/2015  . Diabetic foot (HCC) 11/04/2015  . Peripheral vascular disease (HCC) 10/28/2015  . Diabetes mellitus without complication (HCC) 07/11/2015  . Clavicle enlargement 07/11/2015  . Bone disease 07/11/2015  . Pain in shoulder 06/22/2015  . Poorly controlled type 2 diabetes mellitus (HCC) 06/23/2014  . Arthritis of knee, degenerative 04/28/2014  . Parkinson's disease (HCC) 03/18/2014  . Bed sore on heel, right, unstageable (HCC) 09/23/2013  . Bed sore on heel 09/23/2013  . Coronary atherosclerosis of native coronary artery 03/27/2013  . Chronic diastolic CHF (congestive heart failure) (HCC) 03/27/2013  . Aortic valve stenosis 03/27/2013  .  Essential hypertension 03/27/2013  . Hyperlipidemia 03/27/2013  . Atrial fibrillation (HCC) 03/27/2013  . Bilateral leg edema 03/27/2013  . Constipation 03/27/2013  . HLD (hyperlipidemia) 03/27/2013  . Essential (primary) hypertension 03/27/2013  . Chronic diastolic heart failure (HCC) 03/27/2013  . CAD in native artery 03/27/2013  . Benign prostatic hyperplasia with urinary obstruction 02/12/2013  . FOM (frequency of micturition) 02/12/2013    Orientation RESPIRATION BLADDER Height & Weight     Self  Normal Continent Weight: 174 lb (78.926 kg) Height:  6' (182.9 cm)  BEHAVIORAL SYMPTOMS/MOOD NEUROLOGICAL BOWEL NUTRITION STATUS      Continent Diet (DYS 3)  AMBULATORY STATUS COMMUNICATION OF NEEDS Skin   Limited Assist Verbally Normal                       Personal Care Assistance Level of Assistance  Bathing, Feeding, Dressing Bathing Assistance: Limited assistance Feeding assistance: Limited assistance Dressing Assistance: Limited assistance     Functional Limitations Info  Sight, Hearing, Speech Sight Info: Adequate Hearing Info: Adequate Speech Info: Adequate    SPECIAL CARE FACTORS FREQUENCY  PT (By licensed PT), OT (By licensed OT)     PT Frequency: 5 OT Frequency: 5            Contractures      Additional Factors Info  Code Status, Allergies, Isolation Precautions Code Status Info: Partial Code Allergies Info: No known allergies     Isolation Precautions Info: Contact Precautions     Current Medications (12/08/2015):  This is the current hospital active medication list Current Facility-Administered Medications  Medication Dose Route  Frequency Provider Last Rate Last Dose  . 0.9 %  sodium chloride infusion   Intravenous Continuous Houston Siren, MD 100 mL/hr at 12/08/15 1014    . acetaminophen (TYLENOL) tablet 650 mg  650 mg Oral Q6H PRN Houston Siren, MD       Or  . acetaminophen (TYLENOL) suppository 650 mg  650 mg Rectal Q6H PRN Houston Siren, MD      . amiodarone (PACERONE) tablet 100 mg  100 mg Oral Daily Houston Siren, MD   100 mg at 12/08/15 0819  . antiseptic oral rinse (CPC / CETYLPYRIDINIUM CHLORIDE 0.05%) solution 7 mL  7 mL Mouth Rinse BID Houston Siren, MD   7 mL at 12/08/15 0820  . carbidopa-levodopa (SINEMET IR) 25-100 MG per tablet immediate release 1 tablet  1 tablet Oral QID Houston Siren, MD   1 tablet at 12/08/15 1334  . colchicine tablet 0.6 mg  0.6 mg Oral Daily Houston Siren, MD   0.6 mg at 12/08/15 0818  . enoxaparin (LOVENOX) injection 40 mg  40 mg Subcutaneous Q24H Houston Siren, MD   40 mg at 12/07/15 2110  . feeding supplement (GLUCERNA SHAKE) (GLUCERNA SHAKE) liquid 237 mL  237 mL Oral TID BM Houston Siren, MD   237 mL at 12/08/15 1334  . heparin flush 10 UNIT/ML injection 10 Units  10 Units Intravenous QID Houston Siren, MD   10 Units at 12/08/15 1334  . levothyroxine (SYNTHROID, LEVOTHROID) tablet 150 mcg  150 mcg Oral Q0600 Houston Siren, MD   150 mcg at 12/08/15 0557  . linagliptin (TRADJENTA) tablet 5 mg  5 mg Oral Daily Houston Siren, MD   5 mg at 12/08/15 0818  . misoprostol (CYTOTEC) tablet 100 mcg  100 mcg Oral QID Houston Siren, MD   100 mcg at 12/08/15 1334  . ondansetron (ZOFRAN) tablet 4 mg  4 mg Oral Q6H PRN Houston Siren, MD       Or  . ondansetron (ZOFRAN) injection 4 mg  4 mg Intravenous Q6H PRN Houston Siren, MD      . oxyCODONE-acetaminophen (PERCOCET/ROXICET) 5-325 MG per tablet 1 tablet  1 tablet Oral Q4H PRN Houston Siren, MD   1 tablet at 12/08/15 1501  . pantoprazole (PROTONIX) EC tablet 40 mg  40 mg Oral BID Houston Siren, MD   40 mg at 12/08/15 0817  . piperacillin-tazobactam (ZOSYN) IVPB 3.375 g  3.375 g Intravenous Q8H Houston Siren, MD   3.375 g at 12/08/15 0819  . potassium chloride SA (K-DUR,KLOR-CON) CR tablet 20 mEq  20 mEq Oral BID Houston Siren, MD   20 mEq at 12/08/15 0819  . pramipexole (MIRAPEX) tablet 1 mg  1 mg Oral QID Houston Siren, MD   1 mg at 12/08/15 1333  . sodium chloride flush 0.9 % injection 10 mL  10 mL Intravenous QID Houston Siren, MD   10 mL at 12/08/15 1334  . sucralfate (CARAFATE) tablet 1 g  1 g Oral TID WC & HS Houston Siren, MD   1 g at 12/08/15 1333  . vancomycin (VANCOCIN) 1,250 mg in sodium chloride 0.9 % 250 mL IVPB  1,250 mg Intravenous Q24H Jody C Barefoot, RPH   1,250 mg at 12/08/15 1517     Discharge Medications: Please see discharge summary for a list of discharge medications.  Relevant Imaging Results:  Relevant Lab Results:  Additional Information SSN:  [id number]   Partial Code Info: In the event of cardiac or respiratory ARREST: Initiate Code Blue, Call Rapid Response No   In the event of cardiac or respiratory ARREST: Perform CPR No   In the event of cardiac or respiratory ARREST: Perform Intubation/Mechanical Ventilation No   In the event of cardiac or respiratory ARREST: Use NIPPV/BiPAp only if indicated Yes   In the event of cardiac or respiratory ARREST: Administer ACLS medications if indicated No   In the event of cardiac or respiratory ARREST: Perform Defibrillation or Cardioversion if indicated No      12/07/15 1209       Dede QuerySarah Jahmere Bramel, LCSW

## 2015-12-08 NOTE — Discharge Summary (Signed)
[facility] [place] at [facility]   PATIENT NAME: [name]    MR#:  [id number]  DATE OF BIRTH:  14-Jul-1924  DATE OF ADMISSION:  12/07/2015 ADMITTING PHYSICIAN: Houston Siren, MD  DATE OF DISCHARGE: 12/08/2015  PRIMARY CARE PHYSICIAN: Tonette Lederer, PA-C   ADMISSION DIAGNOSIS:  Osteomyelitis (HCC) [M86.9] AKI (acute kidney injury) (HCC) [N17.9] Sepsis, due to unspecified organism (HCC) [A41.9]  DISCHARGE DIAGNOSIS:  Active Problems:   Hypotension   Pressure ulcer   SECONDARY DIAGNOSIS:   Past Medical History  Diagnosis Date  . Coronary artery disease   . Diabetes mellitus without complication (HCC)   . Aortic stenosis   . Hypertension   . Hyperlipidemia   . TIA (transient ischemic attack)   . Thyroid disease   . Hypothyroidism      ADMITTING HISTORY  [name] is a 82 y.o. male with a known history of known history of Vascular disease, coronary artery disease, diabetes type 2 without complication, aortic stenosis, hypertension, hyperlipidemia, history of previous TIA, hypothyroidism Came into the hospital due to hypotension and altered mental status. Patient was recently discharge from [facility] to a skilled nursing facility and had a follow-up appointment at the podiatrist's office today. At the podiatrist's office patient was noted to be lethargic, altered and pale appearing and therefore sent to the ER for further evaluation. Upon arrival to the emergency room patient was noted to be hypotensive with systolic blood pressures in the 70s. He is afebrile, with a normal white cell count. He is also currently on IV vancomycin and Zosyn to treat osteomyelitis in the left lower extremity. Due to his severe hypotension and mental status change hospitalist services were contacted further treatment and evaluation. Patient presently denies any abdominal pain, nausea, vomiting, diarrhea, chest pain, shortness of breath or any other  associated symptoms. He does say that he has had a poor appetite now for the past few days.  HOSPITAL COURSE:   82 year old male with past medical history of peripheral vascular disease, requiring artery disease, history of aortic stenosis, diabetes type 2 without complication, hypertension, hyperlipidemia, history of previous TIA, hypothyroidism presents to the hospital due to altered mental status and noted to be hypotensive.  1. Acute encephalopathy and hypotension dud to severe dehydration Improved quickly with aggressive hydration  Bcx negative. Xray of foot showed no osteomyelitis. Afebrile Normal WBC Stop lasix at discharge Encouraged to keep himself hydrated.  3. Diabetes type 2 without complication Continue home meds  4. Hypothyroidism-continue Synthroid.  5. History of Parkinson's disease-continue Sinemet.  6. GERD-continue Protonix.  7. Left lower extremity ulcer-x-ray showing no evidence of acute osteomyelitis. Had recent angiogram with angioplasty done. Also had debridement done by podiatry and has a wound VAC on presently. -Continue antibiotics as before.  8. History of atrial fibrillation-rate controlled. Continue amiodarone.  Stable for discharge home.  CONSULTS OBTAINED:     DRUG ALLERGIES:  No Known Allergies  DISCHARGE MEDICATIONS:   Current Discharge Medication List    CONTINUE these medications which have NOT CHANGED   Details  amiodarone (PACERONE) 100 MG tablet Take 100 mg by mouth daily.    carbidopa-levodopa (SINEMET IR) 25-100 MG per tablet Take 1 tablet by mouth 4 (four) times daily.    colchicine 0.6 MG tablet Take 0.6 mg by mouth daily.     feeding supplement, GLUCERNA SHAKE, (GLUCERNA SHAKE) LIQD Take 237 mLs by mouth 3 (three) times daily between meals. Refills: 0  heparin flush 10 UNIT/ML SOLN injection Inject 10 Units into the vein 4 (four) times daily.    levothyroxine (SYNTHROID, LEVOTHROID) 150 MCG tablet Take 150 mcg by  mouth daily before breakfast.    misoprostol (CYTOTEC) 100 MCG tablet Take 100 mcg by mouth 4 (four) times daily.    oxyCODONE-acetaminophen (PERCOCET/ROXICET) 5-325 MG tablet Take 1 tablet by mouth every 4 (four) hours as needed for moderate pain. Qty: 30 tablet, Refills: 0    pantoprazole (PROTONIX) 40 MG tablet Take 1 tablet (40 mg total) by mouth 2 (two) times daily.    piperacillin-tazobactam (ZOSYN) 3.375 GM/50ML IVPB Inject 50 mLs (3.375 g total) into the vein every 8 (eight) hours. Qty: 50 mL, Refills: 35    potassium chloride (K-DUR,KLOR-CON) 10 MEQ tablet Take 10 mEq by mouth daily.     pramipexole (MIRAPEX) 1 MG tablet Take 1 mg by mouth 4 (four) times daily.     sitaGLIPtin (JANUVIA) 100 MG tablet Take 1 tablet (100 mg total) by mouth daily. Qty: 90 tablet, Refills: 1   Associated Diagnoses: Essential hypertension; Diabetes mellitus without complication (HCC)    sodium chloride flush 0.9 % SOLN injection Inject 10 mLs into the vein 4 (four) times daily.    sucralfate (CARAFATE) 1 g tablet Take 1 tablet (1 g total) by mouth 4 (four) times daily -  with meals and at bedtime.    tamsulosin (FLOMAX) 0.4 MG CAPS capsule Take 1 capsule (0.4 mg total) by mouth daily after supper. Qty: 30 capsule, Refills: 6    vancomycin (VANCOCIN) 1-5 GM/200ML-% SOLN Inject 200 mLs (1,000 mg total) into the vein daily. Qty: 4000 mL, Refills: 12      STOP taking these medications     furosemide (LASIX) 40 MG tablet         Today   VITAL SIGNS:  Blood pressure 128/56, pulse 54, temperature 97.8 F (36.6 C), temperature source Oral, resp. rate 18, height 6' (1.829 m), weight 78.926 kg (174 lb), SpO2 98 %.  I/O:   Intake/Output Summary (Last 24 hours) at 12/08/15 1224 Last data filed at 12/08/15 0929  Gross per 24 hour  Intake      0 ml  Output    350 ml  Net   -350 ml    PHYSICAL EXAMINATION:  Physical Exam  GENERAL:  82 y.o.-year-old patient lying in the bed with no acute  distress. Plae. LUNGS: Normal breath sounds bilaterally, no wheezing, rales,rhonchi or crepitation. No use of accessory muscles of respiration.  CARDIOVASCULAR: S1, S2 normal. No murmurs, rubs, or gallops.  ABDOMEN: Soft, non-tender, non-distended. Bowel sounds present. No organomegaly or mass.  NEUROLOGIC: Moves all 4 extremities. PSYCHIATRIC: The patient is alert and awake SKIN: Bilateral foot ulcers  DATA REVIEW:   CBC  Recent Labs Lab 12/08/15 0500  WBC 4.6  HGB 9.8*  HCT 29.0*  PLT 169    Chemistries   Recent Labs Lab 12/07/15 1136 12/08/15 0500  NA 139 141  K 2.9* 3.5  CL 107 115*  CO2 21* 23  GLUCOSE 164* 85  BUN 11 9  CREATININE 1.41* 1.03  CALCIUM 8.6* 7.9*  MG  --  1.6*  AST 12*  --   ALT <5*  --   ALKPHOS 125  --   BILITOT 1.2  --     Cardiac Enzymes  Recent Labs Lab 12/07/15 1136  TROPONINI <0.03    Microbiology Results  Results for orders placed or performed during the hospital encounter  of 12/07/15  Blood Culture (routine x 2)     Status: None (Preliminary result)   Collection Time: 12/07/15 11:41 AM  Result Value Ref Range Status   Specimen Description BLOOD RIGHT ARM  Final   Special Requests   Final    BOTTLES DRAWN AEROBIC AND ANAEROBIC  AERO 1CC ANA 3CC   Culture NO GROWTH < 24 HOURS  Final   Report Status PENDING  Incomplete  Blood Culture (routine x 2)     Status: None (Preliminary result)   Collection Time: 12/07/15 11:41 AM  Result Value Ref Range Status   Specimen Description BLOOD LEFT ARM  Final   Special Requests   Final    BOTTLES DRAWN AEROBIC AND ANAEROBIC  AERO 5CC ANA 7CC   Culture NO GROWTH < 24 HOURS  Final   Report Status PENDING  Incomplete  Urine culture     Status: None   Collection Time: 12/07/15 12:48 PM  Result Value Ref Range Status   Specimen Description URINE, RANDOM  Final   Special Requests NONE  Final   Culture NO GROWTH Performed at [facility]   Final   Report Status 12/08/2015 FINAL   Final  MRSA PCR Screening     Status: Abnormal   Collection Time: 12/07/15  4:03 PM  Result Value Ref Range Status   MRSA by PCR POSITIVE (A) NEGATIVE Final    Comment: CRITICAL RESULT CALLED TO, READ BACK BY AND VERIFIED WITH: DONNA HESLEP 12/07/15 1757 VKB        The GeneXpert MRSA Assay (FDA approved for NASAL specimens only), is one component of a comprehensive MRSA colonization surveillance program. It is not intended to diagnose MRSA infection nor to guide or monitor treatment for MRSA infections.     RADIOLOGY:  Ct Abdomen Pelvis Wo Contrast  12/07/2015  CLINICAL DATA:  Hypotension and slurred speech with nausea and fusion. Recent diagnosis of esophageal tear. EXAM: CT ABDOMEN AND PELVIS WITHOUT CONTRAST TECHNIQUE: Multidetector CT imaging of the abdomen and pelvis was performed following the standard protocol without IV contrast. COMPARISON:  07/28/2009 FINDINGS: Lower chest: Right lower lobe interstitial and airspace disease raises suspicion of pneumonia. Small bilateral pleural effusions are evident. Hepatobiliary: No focal abnormality in the liver on this study without intravenous contrast. No evidence of hepatomegaly. Multiple calcified stones are seen in the gallbladder. No intrahepatic or extrahepatic biliary dilation. Pancreas: Pancreas is diffusely fatty replaced. Multi a lobular cystic lesion in the tail the pancreas has progressed in the interval. Spleen: No splenomegaly. No focal mass lesion. Adrenals/Urinary Tract: No adrenal nodule or mass. Right kidney is atrophic with multiple low-density lesions compatible with cysts. Multiple cysts are noted in the left kidney with lower pole scarring. No hydroureteronephrosis. The urinary bladder appears normal for the degree of distention. Stomach/Bowel: Stomach is nondistended. No gastric wall thickening. No evidence of outlet obstruction. Duodenum is normally positioned as is the ligament of Treitz. No small bowel wall thickening. No  small bowel dilatation. The terminal ileum is normal. Diverticular changes are noted in the left colon without evidence of diverticulitis. Vascular/Lymphatic: There is abdominal aortic atherosclerosis without aneurysm. There is no gastrohepatic or hepatoduodenal ligament lymphadenopathy. No intraperitoneal or retroperitoneal lymphadenopathy. No pelvic sidewall lymphadenopathy. Reproductive: Prostate gland appears enlarged with probable TURP defect. Other: No intraperitoneal free fluid. Musculoskeletal: Stable appearance of trabecular coarsening in the left hemipelvis, likely a reflection of Paget's disease. IMPRESSION: 1. Right lower lobe interstitial and airspace disease suggests pneumonia. 2. Tiny  bilateral pleural effusions. 3. Right renal atrophy with incompletely characterized cystic lesions in the kidneys bilaterally. No hydronephrosis. 4. Multi lobular cystic lesion in the tail the pancreas measuring up to 4.7 x 1.6 cm in total. This has progressed slightly since 07/28/2009. Electronically Signed   By: Kennith Center M.D.   On: 12/07/2015 13:56   Dg Ankle 2 Views Left  12/07/2015  CLINICAL DATA:  Evaluate for osteomyelitis.  Hypotension EXAM: LEFT ANKLE - 2 VIEW COMPARISON:  07/22/2015 FINDINGS: Wound VAC along the lateral ankle. No soft tissue emphysema or signs of acute osteomyelitis. Negative for ankle joint effusion. Osteopenia and atherosclerosis. IMPRESSION: Negative for osteomyelitis or soft tissue emphysema. Electronically Signed   By: Marnee Spring M.D.   On: 12/07/2015 13:05   Dg Ankle 2 Views Right  12/07/2015  CLINICAL DATA:  Pain. EXAM: RIGHT ANKLE - 2 VIEW COMPARISON:  None. FINDINGS: Two-view exam shows no gross fracture or dislocation. Orientation of the lateral malleolus to the ankle mortise cannot be discerned on this two-view oblique study. No worrisome lytic or sclerotic osseous abnormality. IMPRESSION: Limited study without gross bony abnormality. Electronically Signed   By: Kennith Center M.D.   On: 12/07/2015 13:03   Dg Chest Portable 1 View  12/07/2015  CLINICAL DATA:  Sepsis.  Hypotension EXAM: PORTABLE CHEST 1 VIEW COMPARISON:  11/30/2015 FINDINGS: Right upper extremity PICC with tip at the upper right atrium. Low volume chest with chronic elevation of the right diaphragm. Curvilinear lucency at the right base favors atelectasis, pneumoperitoneum not excluded. There is no edema, consolidation, effusion, or pneumothorax. These results were called by telephone at the time of interpretation on 12/07/2015 at 12:59 pm to Dr. Nita Sickle , who verbally acknowledged these results. IMPRESSION: 1. Right basilar lucency could be from atelectasis or pneumoperitoneum. If abdominal pain, recommended CT. 2. No evidence of acute cardiopulmonary disease. Electronically Signed   By: Marnee Spring M.D.   On: 12/07/2015 13:00   Dg Foot 2 Views Left  12/07/2015  CLINICAL DATA:  Hypotension.  Evaluate for osteomyelitis. EXAM: LEFT FOOT - 2 VIEW COMPARISON:  11/04/2015 FINDINGS: Interval transmetatarsal amputation to the first ray. Remote distal fifth metatarsal resection. No generalized soft tissue emphysema. Unremarkable appearance of the osteotomies. Osteopenia and atherosclerosis. IMPRESSION: Unremarkable appearance of interval first ray transmetatarsal amputation. No indication of acute osteomyelitis. Electronically Signed   By: Marnee Spring M.D.   On: 12/07/2015 13:04   Dg Foot 2 Views Right  12/07/2015  CLINICAL DATA:  Pain. EXAM: RIGHT FOOT - 2 VIEW COMPARISON:  None. FINDINGS: Limited two views study with superimposition of bony anatomy. No gross fracture evident. No evidence for frank dislocation. Bones are demineralized. No worrisome lytic or sclerotic osseous abnormality evident. IMPRESSION: Negative. Electronically Signed   By: Kennith Center M.D.   On: 12/07/2015 13:04    Follow up with PCP in 1 week.  Management plans discussed with the patient, family and they are in  agreement.  CODE STATUS:     Code Status Orders        Start     Ordered   12/07/15 1208  Limited resuscitation (code)   Continuous    Question Answer Comment  In the event of cardiac or respiratory ARREST: Initiate Code Blue, Call Rapid Response No   In the event of cardiac or respiratory ARREST: Perform CPR No   In the event of cardiac or respiratory ARREST: Perform Intubation/Mechanical Ventilation No   In the event of cardiac  or respiratory ARREST: Use NIPPV/BiPAp only if indicated Yes   In the event of cardiac or respiratory ARREST: Administer ACLS medications if indicated No   In the event of cardiac or respiratory ARREST: Perform Defibrillation or Cardioversion if indicated No      12/07/15 1209    Code Status History    Date Active Date Inactive Code Status Order ID Comments User Context   11/30/2015 12:47 AM 12/04/2015  6:15 PM DNR [id number]  Delano Metzobert Schertz, MD Inpatient   11/21/2015  4:55 PM 11/28/2015  6:49 PM DNR [id number]  Houston SirenVivek J Sainani, MD Inpatient   11/04/2015  2:00 PM 11/09/2015  7:33 PM Full Code [id number]  Altamese DillingVaibhavkumar Vachhani, MD Inpatient   11/04/2015 12:44 PM 11/04/2015  2:00 PM DNR [id number]  Altamese DillingVaibhavkumar Vachhani, MD ED    Advance Directive Documentation        Most Recent Value   Type of Advance Directive  Healthcare Power of Attorney, Living will, Out of facility DNR (pink MOST or yellow form)   Pre-existing out of facility DNR order (yellow form or pink MOST form)     "MOST" Form in Place?        TOTAL TIME TAKING CARE OF THIS PATIENT ON DAY OF DISCHARGE: more than 30 minutes.   Milagros LollSudini, Ivy Meriwether R M.D on 12/08/2015 at 12:24 PM  Between 7am to 6pm - Pager - [phone]  After 6pm go to [email or web address] - password [email or web address]  [facility]  Office  [phone]  CC: Primary care physician; Tonette LedererKIMBERLY A STEGMAYER, PA-C  Note: This dictation was prepared with Dragon dictation along with smaller phrase technology. Any transcriptional errors that  result from this process are unintentional.

## 2015-12-08 NOTE — Plan of Care (Signed)
[facility] and therefore [facility] would not be able to send power cord or leave intact while patient is in hospital.  They would be sending representative to remove vac and hospital would need to provide for remainder of stay.

## 2015-12-09 DIAGNOSIS — E876 Hypokalemia: Secondary | ICD-10-CM | POA: Diagnosis not present

## 2015-12-10 ENCOUNTER — Non-Acute Institutional Stay (SKILLED_NURSING_FACILITY): Payer: PPO | Admitting: Gerontology

## 2015-12-10 DIAGNOSIS — K226 Gastro-esophageal laceration-hemorrhage syndrome: Secondary | ICD-10-CM

## 2015-12-10 DIAGNOSIS — I959 Hypotension, unspecified: Secondary | ICD-10-CM

## 2015-12-10 LAB — GLUCOSE, CAPILLARY
GLUCOSE-CAPILLARY: 133 mg/dL — AB (ref 65–99)
GLUCOSE-CAPILLARY: 119 mg/dL — AB (ref 65–99)
Glucose-Capillary: 89 mg/dL (ref 65–99)
Glucose-Capillary: 91 mg/dL (ref 65–99)
Glucose-Capillary: 89 mg/dL (ref 65–99)
Glucose-Capillary: 93 mg/dL (ref 65–99)
Glucose-Capillary: 115 mg/dL — ABNORMAL HIGH (ref 65–99)

## 2015-12-10 NOTE — Progress Notes (Signed)
Location:   [facility] Service:  SNF [phone]) Provider:  Toni Arthurs, NP-C  Sela Hua, PA-C  Patient Care Team: Sela Hua, PA-C as PCP - General (Physician Assistant) Katha Cabal, MD (Vascular Surgery) Minna Merritts, MD as Consulting Physician (Cardiology) Beverly Gust, MD (Unknown Physician Specialty) Anabel Bene, MD as Referring Physician (Neurology) Duanne Guess, PA-C as Physician Assistant (Orthopedic Surgery) Minna Merritts, MD as Consulting Physician (Cardiology)  Extended Emergency Contact Information Primary Emergency Contact: Wells Guiles of [place] Phone: [phone] Relation: Daughter Secondary Emergency Contact: Leim Fabry [place] of [place] Phone: [phone] Mobile Phone: [phone] Relation: Son  Code Status:  Full Goals of care: Advanced Directive information Advanced Directives 12/07/2015  Does patient have an advance directive? Yes  Type of Paramedic of [place];Living will;Out of facility DNR (pink MOST or yellow form)  Does patient want to make changes to advanced directive? -  Copy of advanced directive(s) in chart? -  Pre-existing out of facility DNR order (yellow form or pink MOST form) -     Chief Complaint  Patient presents with  . Hospitalization Follow-up    HPI:  Pt is a 82 y.o. male seen today for a hospital f/u s/p admission from [facility]. He was admitted for several days after he had hematemesis and was found to have a Mallory-Weiss tear, significant blood loss, and blood transfusions. He was readmitted to the facility to continue rehab but was sent to the ED 2 days later when he experienced hypotension, nausea and vomiting at the podiatrist appointment. He was evaluated, kept for observation for 2 days, and sent back to the facility yesterday. He reports he is feeling better, ready to continue therapy and get better. He  is very thankful for the care he has received thus far.    Past Medical History  Diagnosis Date  . Coronary artery disease   . Diabetes mellitus without complication ([place])   . Aortic stenosis   . Hypertension   . Hyperlipidemia   . TIA (transient ischemic attack)   . Thyroid disease   . Hypothyroidism    Past Surgical History  Procedure Laterality Date  . Appendectomy    . Hernia repair    . Knee surgery      bilateral  . Eye surgery    . Colonoscopy    . Cardiac catheterization  2010    showed a patient circumflex stent with noncritical CAD and normal right-sided pressures.  . Coronary angioplasty  01/24/2007    stent placement to the mid circumflex   . Knee surgery Left   . Peripheral vascular catheterization N/A 08/30/2015    Procedure: Abdominal Aortogram w/Lower Extremity;  Surgeon: Katha Cabal, MD;  Location: [facility];  Service: Cardiovascular;  Laterality: N/A;  . Peripheral vascular catheterization  08/30/2015    Procedure: Lower Extremity Intervention;  Surgeon: Katha Cabal, MD;  Location: [facility];  Service: Cardiovascular;;  . Amputation toe Left 11/05/2015    Procedure: AMPUTATION TOE;  Surgeon: Albertine Patricia, DPM;  Location: [facility];  Service: Podiatry;  Laterality: Left;  . Irrigation and debridement foot Left 11/05/2015    Procedure: IRRIGATION AND DEBRIDEMENT FOOT / HEEL ULCER;  Surgeon: Albertine Patricia, DPM;  Location: [facility];  Service: Podiatry;  Laterality: Left;  . Application of wound vac Left 11/05/2015    Procedure: APPLICATION OF WOUND VAC;  Surgeon:  Albertine Patricia, DPM;  Location: [facility];  Service: Podiatry;  Laterality: Left;  . Peripheral vascular catheterization Left 11/23/2015    Procedure: Abdominal Aortogram w/Lower Extremity;  Surgeon: Katha Cabal, MD;  Location: [facility];  Service: Cardiovascular;  Laterality: Left;  . Peripheral vascular catheterization  11/23/2015    Procedure: Lower Extremity  Intervention;  Surgeon: Katha Cabal, MD;  Location: [facility];  Service: Cardiovascular;;  . Irrigation and debridement foot Left 11/24/2015    Procedure: IRRIGATION AND DEBRIDEMENT FOOT;  Surgeon: Albertine Patricia, DPM;  Location: [facility];  Service: Podiatry;  Laterality: Left;  . Peripheral vascular catheterization Right 11/25/2015    Procedure: Lower Extremity Angiography;  Surgeon: Katha Cabal, MD;  Location: [facility];  Service: Cardiovascular;  Laterality: Right;  . Esophagogastroduodenoscopy N/A 11/30/2015    Procedure: ESOPHAGOGASTRODUODENOSCOPY (EGD);  Surgeon: Wilford Corner, MD;  Location: [facility];  Service: Endoscopy;  Laterality: N/A;  . Esophageal tear      No Known Allergies    Medication List       This list is accurate as of: 12/10/15 12:50 AM.  Always use your most recent med list.               amiodarone 100 MG tablet  Commonly known as:  PACERONE  Take 100 mg by mouth daily.     carbidopa-levodopa 25-100 MG tablet  Commonly known as:  SINEMET IR  Take 1 tablet by mouth 4 (four) times daily.     colchicine 0.6 MG tablet  Take 0.6 mg by mouth daily.     feeding supplement (GLUCERNA SHAKE) Liqd  Take 237 mLs by mouth 3 (three) times daily between meals.     furosemide 40 MG tablet  Commonly known as:  LASIX     heparin flush 10 UNIT/ML Soln injection  Inject 10 Units into the vein 4 (four) times daily.     levothyroxine 150 MCG tablet  Commonly known as:  SYNTHROID, LEVOTHROID  Take 150 mcg by mouth daily before breakfast.     misoprostol 100 MCG tablet  Commonly known as:  CYTOTEC  Take 100 mcg by mouth 4 (four) times daily.     oxyCODONE-acetaminophen 5-325 MG tablet  Commonly known as:  PERCOCET/ROXICET  Take 1 tablet by mouth every 4 (four) hours as needed for moderate pain.     pantoprazole 40 MG tablet  Commonly known as:  PROTONIX  Take 1 tablet (40 mg total) by mouth 2 (two) times daily.      potassium chloride 10 MEQ tablet  Commonly known as:  K-DUR,KLOR-CON  Take 10 mEq by mouth daily.     pramipexole 1 MG tablet  Commonly known as:  MIRAPEX  Take 1 mg by mouth 4 (four) times daily.     sitaGLIPtin 100 MG tablet  Commonly known as:  JANUVIA  Take 1 tablet (100 mg total) by mouth daily.     sodium chloride flush 0.9 % Soln injection  Inject 10 mLs into the vein 4 (four) times daily.     sucralfate 1 g tablet  Commonly known as:  CARAFATE  Take 1 tablet (1 g total) by mouth 4 (four) times daily -  with meals and at bedtime.     tamsulosin 0.4 MG Caps capsule  Commonly known as:  FLOMAX  Take 1 capsule (0.4 mg total) by mouth daily after supper.        Review of Systems  Constitutional: Negative.   HENT: Negative.   Respiratory: Negative.   Cardiovascular: Negative.   Gastrointestinal: Negative.   Genitourinary: Negative.   Musculoskeletal: Negative.   Skin: Positive for pallor and wound.  Neurological: Negative.   Psychiatric/Behavioral: Negative.     Immunization History  Administered Date(s) Administered  . Influenza-Unspecified 03/04/2014, 03/16/2015  . Pneumococcal-Unspecified 01/11/2002, 05/10/2010  . Td 06/18/2005  . Zoster 02/06/2007   Pertinent  Health Maintenance Due  Topic Date Due  . OPHTHALMOLOGY EXAM  01/12/1935  . PNA vac Low Risk Adult (2 of 2 - PCV13) 05/11/2011  . INFLUENZA VACCINE  01/03/2016  . HEMOGLOBIN A1C  05/23/2016  . FOOT EXAM  09/26/2016  . URINE MICROALBUMIN  09/26/2016   Fall Risk  09/27/2015  Falls in the past year? Exclusion - non ambulatory   Functional Status Survey:    There were no vitals filed for this visit. There is no weight on file to calculate BMI. Physical Exam  Constitutional: He is oriented to person, place, and time. Vital signs are normal. He appears well-developed and well-nourished. He is active and cooperative.  Cardiovascular: Normal rate, regular rhythm, S1 normal and intact distal pulses.   Exam reveals distant heart sounds and decreased pulses. Exam reveals no gallop and no friction rub.   Murmur heard. Pulses:      Dorsalis pedis pulses are 1+ on the right side, and 1+ on the left side.  Pulmonary/Chest: Effort normal and breath sounds normal. No accessory muscle usage. No respiratory distress.  Abdominal: Soft. Normal appearance. Bowel sounds are decreased. There is no tenderness.  Neurological: He is alert and oriented to person, place, and time. He has normal strength.  Skin: Skin is warm and dry. No cyanosis. There is pallor. Nails show no clubbing.  B-heel ulcers. Dressings CDI. Prevelon boots in place. Awaiting replacement of wound vac.  Psychiatric: He has a normal mood and affect. His speech is normal and behavior is normal. Judgment and thought content normal. Cognition and memory are normal.  Nursing note and vitals reviewed.   Labs reviewed:  Recent Labs  11/23/15 0541 11/25/15 0500  12/05/15 1425 12/07/15 1136 12/08/15 0500  NA 139 138  < > 140 139 141  K 3.8 3.4*  < > 3.3* 2.9* 3.5  CL 103 108  < > 111 107 115*  CO2 28 26  < > 24 21* 23  GLUCOSE 157* 118*  < > 109* 164* 85  BUN 20 17  < > _0 CREATININE 1.18 1.03  < > 1.01 1.41* 1.03  CALCIUM 8.7* 8.2*  < > 8.0* 8.6* 7.9*  MG 1.9 1.8  --   --   --  1.6*  < > = values in this interval not displayed.  Recent Labs  11/29/15 1000 11/29/15 1847 12/07/15 1136  AST 13* 14* 12*  ALT 8* 9* <5*  ALKPHOS 147* 151* 125  BILITOT 0.9 0.6 1.2  PROT 5.2* 5.6* 5.9*  ALBUMIN 2.3* 2.6* 2.8*    Recent Labs  11/21/15 1236  11/29/15 1000 11/29/15 1847  12/04/15 0334 12/07/15 1136 12/08/15 0500  WBC 9.7  < > 8.8 11.1*  < > 6.5 10.2 4.6  NEUTROABS 7.5*  --  6.7* 8.2*  --   --   --   --   HGB 11.8*  < > 8.8* 9.1*  < > 9.4* 12.6* 9.8*  HCT 35.6*  < > 25.4* 27.2*  < > 28.1* 36.2* 29.0*  MCV 90.2  < >  88.8 90.6  < > 90.9 89.5 90.8  PLT 420  < > 222 266  < > 217 253 169  < > = values in this interval  not displayed. Lab Results  Component Value Date   TSH 3.080 09/27/2015   Lab Results  Component Value Date   HGBA1C 7.5* 11/22/2015   Lab Results  Component Value Date   CHOL 146 09/27/2015   HDL 23* 09/27/2015   LDLCALC 73 09/27/2015   TRIG 252* 09/27/2015    Significant Diagnostic Results in last 30 days:  Ct Abdomen Pelvis Wo Contrast  12/07/2015  CLINICAL DATA:  Hypotension and slurred speech with nausea and fusion. Recent diagnosis of esophageal tear. EXAM: CT ABDOMEN AND PELVIS WITHOUT CONTRAST TECHNIQUE: Multidetector CT imaging of the abdomen and pelvis was performed following the standard protocol without IV contrast. COMPARISON:  07/28/2009 FINDINGS: Lower chest: Right lower lobe interstitial and airspace disease raises suspicion of pneumonia. Small bilateral pleural effusions are evident. Hepatobiliary: No focal abnormality in the liver on this study without intravenous contrast. No evidence of hepatomegaly. Multiple calcified stones are seen in the gallbladder. No intrahepatic or extrahepatic biliary dilation. Pancreas: Pancreas is diffusely fatty replaced. Multi a lobular cystic lesion in the tail the pancreas has progressed in the interval. Spleen: No splenomegaly. No focal mass lesion. Adrenals/Urinary Tract: No adrenal nodule or mass. Right kidney is atrophic with multiple low-density lesions compatible with cysts. Multiple cysts are noted in the left kidney with lower pole scarring. No hydroureteronephrosis. The urinary bladder appears normal for the degree of distention. Stomach/Bowel: Stomach is nondistended. No gastric wall thickening. No evidence of outlet obstruction. Duodenum is normally positioned as is the ligament of Treitz. No small bowel wall thickening. No small bowel dilatation. The terminal ileum is normal. Diverticular changes are noted in the left colon without evidence of diverticulitis. Vascular/Lymphatic: There is abdominal aortic atherosclerosis without  aneurysm. There is no gastrohepatic or hepatoduodenal ligament lymphadenopathy. No intraperitoneal or retroperitoneal lymphadenopathy. No pelvic sidewall lymphadenopathy. Reproductive: Prostate gland appears enlarged with probable TURP defect. Other: No intraperitoneal free fluid. Musculoskeletal: Stable appearance of trabecular coarsening in the left hemipelvis, likely a reflection of Paget's disease. IMPRESSION: 1. Right lower lobe interstitial and airspace disease suggests pneumonia. 2. Tiny bilateral pleural effusions. 3. Right renal atrophy with incompletely characterized cystic lesions in the kidneys bilaterally. No hydronephrosis. 4. Multi lobular cystic lesion in the tail the pancreas measuring up to 4.7 x 1.6 cm in total. This has progressed slightly since 07/28/2009. Electronically Signed   By: Misty Stanley M.D.   On: 12/07/2015 13:56   Dg Ankle 2 Views Left  12/07/2015  CLINICAL DATA:  Evaluate for osteomyelitis.  Hypotension EXAM: LEFT ANKLE - 2 VIEW COMPARISON:  07/22/2015 FINDINGS: Wound VAC along the lateral ankle. No soft tissue emphysema or signs of acute osteomyelitis. Negative for ankle joint effusion. Osteopenia and atherosclerosis. IMPRESSION: Negative for osteomyelitis or soft tissue emphysema. Electronically Signed   By: Monte Fantasia M.D.   On: 12/07/2015 13:05   Dg Ankle 2 Views Right  12/07/2015  CLINICAL DATA:  Pain. EXAM: RIGHT ANKLE - 2 VIEW COMPARISON:  None. FINDINGS: Two-view exam shows no gross fracture or dislocation. Orientation of the lateral malleolus to the ankle mortise cannot be discerned on this two-view oblique study. No worrisome lytic or sclerotic osseous abnormality. IMPRESSION: Limited study without gross bony abnormality. Electronically Signed   By: Misty Stanley M.D.   On: 12/07/2015 13:03   Dg Chest Portable 1  View  12/07/2015  CLINICAL DATA:  Sepsis.  Hypotension EXAM: PORTABLE CHEST 1 VIEW COMPARISON:  11/30/2015 FINDINGS: Right upper extremity PICC with  tip at the upper right atrium. Low volume chest with chronic elevation of the right diaphragm. Curvilinear lucency at the right base favors atelectasis, pneumoperitoneum not excluded. There is no edema, consolidation, effusion, or pneumothorax. These results were called by telephone at the time of interpretation on 12/07/2015 at 12:59 pm to Dr. Rudene Re , who verbally acknowledged these results. IMPRESSION: 1. Right basilar lucency could be from atelectasis or pneumoperitoneum. If abdominal pain, recommended CT. 2. No evidence of acute cardiopulmonary disease. Electronically Signed   By: Monte Fantasia M.D.   On: 12/07/2015 13:00   Dg Foot 2 Views Left  12/07/2015  CLINICAL DATA:  Hypotension.  Evaluate for osteomyelitis. EXAM: LEFT FOOT - 2 VIEW COMPARISON:  11/04/2015 FINDINGS: Interval transmetatarsal amputation to the first ray. Remote distal fifth metatarsal resection. No generalized soft tissue emphysema. Unremarkable appearance of the osteotomies. Osteopenia and atherosclerosis. IMPRESSION: Unremarkable appearance of interval first ray transmetatarsal amputation. No indication of acute osteomyelitis. Electronically Signed   By: Monte Fantasia M.D.   On: 12/07/2015 13:04   Dg Foot 2 Views Right  12/07/2015  CLINICAL DATA:  Pain. EXAM: RIGHT FOOT - 2 VIEW COMPARISON:  None. FINDINGS: Limited two views study with superimposition of bony anatomy. No gross fracture evident. No evidence for frank dislocation. Bones are demineralized. No worrisome lytic or sclerotic osseous abnormality evident. IMPRESSION: Negative. Electronically Signed   By: Misty Stanley M.D.   On: 12/07/2015 13:04    Assessment/Plan 1. Mallory-Weiss tear  Continue Protonix 40 mg po BID  Continue Cytotec 100 mcg po QID  Continue Sucralfate 1 G po QID  Hold ASA, Plavix  2. Hypotension  D/C furosemide 40 mg daily  Monitor vitals closely   Family/ staff Communication:   Total Time: 35 minutes  Documentation: 20  minutes  Face to Face: 15 minutes  Family/Phone:   Labs/tests ordered:  CBC, Met C, Vanc trough  Vikki Ports, NP-C [facility] [address], [place] [address] Cell Phone (Mon-Fri 8am-5pm):  [phone] On Call:  [phone] & follow prompts after 5pm & weekends Office Phone:  [phone] Office Fax:  [phone]

## 2015-12-11 DIAGNOSIS — E876 Hypokalemia: Secondary | ICD-10-CM | POA: Diagnosis not present

## 2015-12-11 LAB — CBC WITH DIFFERENTIAL/PLATELET
BASOS ABS: 0.1 10*3/uL (ref 0–0.1)
BASOS PCT: 1 %
Eosinophils Absolute: 0.9 10*3/uL — ABNORMAL HIGH (ref 0–0.7)
Eosinophils Relative: 16 %
HEMATOCRIT: 31.3 % — AB (ref 40.0–52.0)
Hemoglobin: 10.6 g/dL — ABNORMAL LOW (ref 13.0–18.0)
LYMPHS PCT: 14 %
Lymphs Abs: 0.8 10*3/uL — ABNORMAL LOW (ref 1.0–3.6)
MCH: 30.4 pg (ref 26.0–34.0)
MCHC: 33.8 g/dL (ref 32.0–36.0)
MCV: 90 fL (ref 80.0–100.0)
MONO ABS: 0.5 10*3/uL (ref 0.2–1.0)
Monocytes Relative: 8 %
NEUTROS ABS: 3.6 10*3/uL (ref 1.4–6.5)
NEUTROS PCT: 61 %
Platelets: 195 10*3/uL (ref 150–440)
RBC: 3.47 MIL/uL — AB (ref 4.40–5.90)
RDW: 16.8 % — AB (ref 11.5–14.5)
WBC: 5.9 10*3/uL (ref 3.8–10.6)

## 2015-12-11 LAB — COMPREHENSIVE METABOLIC PANEL
ALBUMIN: 2.3 g/dL — AB (ref 3.5–5.0)
ALT: <5 U/L — ABNORMAL LOW (ref 17–63)
ANION GAP: 5 (ref 5–15)
AST: 10 U/L — ABNORMAL LOW (ref 15–41)
Alkaline Phosphatase: 93 U/L (ref 38–126)
BILIRUBIN TOTAL: 0.7 mg/dL (ref 0.3–1.2)
BUN: 6 mg/dL (ref 6–20)
CO2: 24 mmol/L (ref 22–32)
Calcium: 8 mg/dL — ABNORMAL LOW (ref 8.9–10.3)
Chloride: 110 mmol/L (ref 101–111)
Creatinine, Ser: 1.05 mg/dL (ref 0.61–1.24)
GFR calc Af Amer: >60 mL/min (ref >60)
GLUCOSE: 89 mg/dL (ref 65–99)
POTASSIUM: 3 mmol/L — AB (ref 3.5–5.1)
Sodium: 139 mmol/L (ref 135–145)
TOTAL PROTEIN: 4.6 g/dL — AB (ref 6.5–8.1)

## 2015-12-11 LAB — GLUCOSE, CAPILLARY
GLUCOSE-CAPILLARY: 92 mg/dL (ref 65–99)
GLUCOSE-CAPILLARY: 115 mg/dL — AB (ref 65–99)
Glucose-Capillary: 111 mg/dL — ABNORMAL HIGH (ref 65–99)

## 2015-12-11 LAB — VANCOMYCIN, TROUGH: VANCOMYCIN TR: 12 ug/mL — AB (ref 15–20)

## 2015-12-12 DIAGNOSIS — E876 Hypokalemia: Secondary | ICD-10-CM | POA: Diagnosis not present

## 2015-12-12 LAB — CULTURE, BLOOD (ROUTINE X 2)
Culture: NO GROWTH
Culture: NO GROWTH

## 2015-12-12 LAB — GLUCOSE, CAPILLARY
GLUCOSE-CAPILLARY: 90 mg/dL (ref 65–99)
GLUCOSE-CAPILLARY: 191 mg/dL — AB (ref 65–99)
GLUCOSE-CAPILLARY: 128 mg/dL — AB (ref 65–99)
GLUCOSE-CAPILLARY: 122 mg/dL — AB (ref 65–99)

## 2015-12-13 DIAGNOSIS — E876 Hypokalemia: Secondary | ICD-10-CM | POA: Diagnosis not present

## 2015-12-13 LAB — CBC WITH DIFFERENTIAL/PLATELET
BASOS PCT: 1 %
Basophils Absolute: 0 10*3/uL (ref 0–0.1)
Eosinophils Absolute: 0.9 10*3/uL — ABNORMAL HIGH (ref 0–0.7)
Eosinophils Relative: 17 %
HCT: 30.9 % — ABNORMAL LOW (ref 40.0–52.0)
HEMOGLOBIN: 10.6 g/dL — AB (ref 13.0–18.0)
LYMPHS ABS: 1.1 10*3/uL (ref 1.0–3.6)
LYMPHS PCT: 19 %
MCH: 30.7 pg (ref 26.0–34.0)
MCHC: 34.2 g/dL (ref 32.0–36.0)
MCV: 89.7 fL (ref 80.0–100.0)
Monocytes Absolute: 0.5 10*3/uL (ref 0.2–1.0)
Monocytes Relative: 10 %
NEUTROS PCT: 53 %
Neutro Abs: 3 10*3/uL (ref 1.4–6.5)
PLATELETS: 183 10*3/uL (ref 150–440)
RBC: 3.44 MIL/uL — ABNORMAL LOW (ref 4.40–5.90)
RDW: 17.3 % — ABNORMAL HIGH (ref 11.5–14.5)
WBC: 5.6 10*3/uL (ref 3.8–10.6)

## 2015-12-13 LAB — COMPREHENSIVE METABOLIC PANEL WITH GFR
ALT: <5 U/L — ABNORMAL LOW (ref 17–63)
AST: 9 U/L — ABNORMAL LOW (ref 15–41)
Albumin: 2.1 g/dL — ABNORMAL LOW (ref 3.5–5.0)
Alkaline Phosphatase: 94 U/L (ref 38–126)
Anion gap: 4 — ABNORMAL LOW (ref 5–15)
BUN: 6 mg/dL (ref 6–20)
CO2: 25 mmol/L (ref 22–32)
Calcium: 8.3 mg/dL — ABNORMAL LOW (ref 8.9–10.3)
Chloride: 111 mmol/L (ref 101–111)
Creatinine, Ser: 1.06 mg/dL (ref 0.61–1.24)
GFR calc Af Amer: >60 mL/min
GFR calc non Af Amer: >60 mL/min
Glucose, Bld: 75 mg/dL (ref 65–99)
Potassium: 3.9 mmol/L (ref 3.5–5.1)
Sodium: 140 mmol/L (ref 135–145)
Total Bilirubin: 0.5 mg/dL (ref 0.3–1.2)
Total Protein: 4.7 g/dL — ABNORMAL LOW (ref 6.5–8.1)

## 2015-12-13 LAB — GLUCOSE, CAPILLARY: Glucose-Capillary: 90 mg/dL (ref 65–99)

## 2015-12-15 ENCOUNTER — Non-Acute Institutional Stay (SKILLED_NURSING_FACILITY): Payer: PPO | Admitting: Gerontology

## 2015-12-15 DIAGNOSIS — R0902 Hypoxemia: Secondary | ICD-10-CM | POA: Diagnosis not present

## 2015-12-15 DIAGNOSIS — E876 Hypokalemia: Secondary | ICD-10-CM | POA: Diagnosis not present

## 2015-12-15 LAB — CBC WITH DIFFERENTIAL/PLATELET
Basophils Absolute: 0.1 10*3/uL (ref 0–0.1)
Basophils Relative: 1 %
EOS ABS: 0.6 10*3/uL (ref 0–0.7)
Eosinophils Relative: 10 %
HEMATOCRIT: 31.8 % — AB (ref 40.0–52.0)
HEMOGLOBIN: 10.5 g/dL — AB (ref 13.0–18.0)
LYMPHS ABS: 0.8 10*3/uL — AB (ref 1.0–3.6)
Lymphocytes Relative: 13 %
MCH: 30.1 pg (ref 26.0–34.0)
MCHC: 33 g/dL (ref 32.0–36.0)
MCV: 91.3 fL (ref 80.0–100.0)
MONO ABS: 0.6 10*3/uL (ref 0.2–1.0)
MONOS PCT: 9 %
NEUTROS PCT: 67 %
Neutro Abs: 4.1 10*3/uL (ref 1.4–6.5)
Platelets: 193 10*3/uL (ref 150–440)
RBC: 3.48 MIL/uL — ABNORMAL LOW (ref 4.40–5.90)
RDW: 17.9 % — AB (ref 11.5–14.5)
WBC: 6.1 10*3/uL (ref 3.8–10.6)

## 2015-12-15 LAB — COMPREHENSIVE METABOLIC PANEL
ALK PHOS: 91 U/L (ref 38–126)
ALT: <5 U/L — ABNORMAL LOW (ref 17–63)
ANION GAP: 7 (ref 5–15)
AST: 10 U/L — ABNORMAL LOW (ref 15–41)
Albumin: 2.2 g/dL — ABNORMAL LOW (ref 3.5–5.0)
BILIRUBIN TOTAL: 1 mg/dL (ref 0.3–1.2)
BUN: 9 mg/dL (ref 6–20)
CALCIUM: 8.1 mg/dL — AB (ref 8.9–10.3)
CO2: 25 mmol/L (ref 22–32)
Chloride: 108 mmol/L (ref 101–111)
Creatinine, Ser: 1.36 mg/dL — ABNORMAL HIGH (ref 0.61–1.24)
GFR calc non Af Amer: 45 mL/min — ABNORMAL LOW (ref >60)
GFR, EST AFRICAN AMERICAN: 52 mL/min — AB (ref >60)
Glucose, Bld: 132 mg/dL — ABNORMAL HIGH (ref 65–99)
Potassium: 3.4 mmol/L — ABNORMAL LOW (ref 3.5–5.1)
Sodium: 140 mmol/L (ref 135–145)
TOTAL PROTEIN: 4.8 g/dL — AB (ref 6.5–8.1)

## 2015-12-15 LAB — C-REACTIVE PROTEIN: CRP: 4.9 mg/dL — AB (ref <1.0)

## 2015-12-15 LAB — SEDIMENTATION RATE: SED RATE: 47 mm/h — AB (ref 0–20)

## 2015-12-15 LAB — VANCOMYCIN, TROUGH: VANCOMYCIN TR: 20 ug/mL (ref 15–20)

## 2015-12-15 NOTE — Progress Notes (Signed)
[facility]:  [facility] Service:  SNF [phone]) Provider:  Toni Arthurs, NP-C  Sela Hua, PA-C  Patient Care Team: Sela Hua, PA-C as PCP - General (Physician Assistant) Katha Cabal, MD (Vascular Surgery) Minna Merritts, MD as Consulting Physician (Cardiology) Beverly Gust, MD (Unknown Physician Specialty) Anabel Bene, MD as Referring Physician (Neurology) Duanne Guess, PA-C as Physician Assistant (Orthopedic Surgery) Minna Merritts, MD as Consulting Physician (Cardiology)  Extended Emergency Contact Information Primary Emergency Contact: Wells Guiles of [place] Phone: [phone] Relation: Daughter Secondary Emergency Contact: Leim Fabry [place] of [place] Phone: [phone] Mobile Phone: [phone] Relation: Son  Code Status:  DNR Goals of care: Advanced Directive information Advanced Directives 12/07/2015  Does patient have an advance directive? Yes  Type of Paramedic of [place];Living will;Out of facility DNR (pink MOST or yellow form)  Does patient want to make changes to advanced directive? -  Copy of advanced directive(s) in chart? -  Pre-existing out of facility DNR order (yellow form or pink MOST form) -     Chief Complaint  Patient presents with  . Shortness of Breath    HPI:  Pt is a 82 y.o. male seen today for an acute visit for dyspnea, hypoxia. Nursing called me this am reporting O2 sat of 86% on RA, facial edema, and c/o Right arm "Heaviness". Also, complained to nursing that he felt too weak to stand to void. Within a few minutes, pt was siting up on the side of the bed eating breakfast. Sats increased to 96% on 2 L [place]. This is a new occurrence for the pt. At time of assessment, pt denies CP/ SOB. Says now he "feels fine." Lasix was stopped a few days ago d/t hypotension.    Past Medical History  Diagnosis Date  . Coronary artery  disease   . Diabetes mellitus without complication ([place])   . Aortic stenosis   . Hypertension   . Hyperlipidemia   . TIA (transient ischemic attack)   . Thyroid disease   . Hypothyroidism    Past Surgical History  Procedure Laterality Date  . Appendectomy    . Hernia repair    . Knee surgery      bilateral  . Eye surgery    . Colonoscopy    . Cardiac catheterization  2010    showed a patient circumflex stent with noncritical CAD and normal right-sided pressures.  . Coronary angioplasty  01/24/2007    stent placement to the mid circumflex   . Knee surgery Left   . Peripheral vascular catheterization N/A 08/30/2015    Procedure: Abdominal Aortogram w/Lower Extremity;  Surgeon: Katha Cabal, MD;  Location: [place] [facility];  Service: Cardiovascular;  Laterality: N/A;  . Peripheral vascular catheterization  08/30/2015    Procedure: Lower Extremity Intervention;  Surgeon: Katha Cabal, MD;  Location: [place] [facility];  Service: Cardiovascular;;  . Amputation toe Left 11/05/2015    Procedure: AMPUTATION TOE;  Surgeon: Albertine Patricia, DPM;  Location: [facility];  Service: Podiatry;  Laterality: Left;  . Irrigation and debridement foot Left 11/05/2015    Procedure: IRRIGATION AND DEBRIDEMENT FOOT / HEEL ULCER;  Surgeon: Albertine Patricia, DPM;  Location: [facility];  Service: Podiatry;  Laterality: Left;  . Application of wound vac Left 11/05/2015    Procedure: APPLICATION OF WOUND VAC;  Surgeon: Albertine Patricia, DPM;  Location: [facility];  Service:  Podiatry;  Laterality: Left;  . Peripheral vascular catheterization Left 11/23/2015    Procedure: Abdominal Aortogram w/Lower Extremity;  Surgeon: Katha Cabal, MD;  Location: [place] [facility];  Service: Cardiovascular;  Laterality: Left;  . Peripheral vascular catheterization  11/23/2015    Procedure: Lower Extremity Intervention;  Surgeon: Katha Cabal, MD;  Location: [place] [facility];  Service: Cardiovascular;;  .  Irrigation and debridement foot Left 11/24/2015    Procedure: IRRIGATION AND DEBRIDEMENT FOOT;  Surgeon: Albertine Patricia, DPM;  Location: [facility];  Service: Podiatry;  Laterality: Left;  . Peripheral vascular catheterization Right 11/25/2015    Procedure: Lower Extremity Angiography;  Surgeon: Katha Cabal, MD;  Location: [place] [facility];  Service: Cardiovascular;  Laterality: Right;  . Esophagogastroduodenoscopy N/A 11/30/2015    Procedure: ESOPHAGOGASTRODUODENOSCOPY (EGD);  Surgeon: Wilford Corner, MD;  Location: [facility];  Service: Endoscopy;  Laterality: N/A;  . Esophageal tear      No Known Allergies    Medication List       This list is accurate as of: 12/15/15 12:05 PM.  Always use your most recent med list.               amiodarone 100 MG tablet  Commonly known as:  PACERONE  Take 100 mg by mouth daily.     carbidopa-levodopa 25-100 MG tablet  Commonly known as:  SINEMET IR  Take 1 tablet by mouth 4 (four) times daily.     colchicine 0.6 MG tablet  Take 0.6 mg by mouth daily.     feeding supplement (GLUCERNA SHAKE) Liqd  Take 237 mLs by mouth 3 (three) times daily between meals.     furosemide 40 MG tablet  Commonly known as:  LASIX     heparin flush 10 UNIT/ML Soln injection  Inject 10 Units into the vein 4 (four) times daily.     levothyroxine 150 MCG tablet  Commonly known as:  SYNTHROID, LEVOTHROID  Take 150 mcg by mouth daily before breakfast.     misoprostol 100 MCG tablet  Commonly known as:  CYTOTEC  Take 100 mcg by mouth 4 (four) times daily.     oxyCODONE-acetaminophen 5-325 MG tablet  Commonly known as:  PERCOCET/ROXICET  Take 1 tablet by mouth every 4 (four) hours as needed for moderate pain.     pantoprazole 40 MG tablet  Commonly known as:  PROTONIX  Take 1 tablet (40 mg total) by mouth 2 (two) times daily.     potassium chloride 10 MEQ tablet  Commonly known as:  K-DUR,KLOR-CON  Take 10 mEq by mouth daily.      pramipexole 1 MG tablet  Commonly known as:  MIRAPEX  Take 1 mg by mouth 4 (four) times daily.     sitaGLIPtin 100 MG tablet  Commonly known as:  JANUVIA  Take 1 tablet (100 mg total) by mouth daily.     sodium chloride flush 0.9 % Soln injection  Inject 10 mLs into the vein 4 (four) times daily.     sucralfate 1 g tablet  Commonly known as:  CARAFATE  Take 1 tablet (1 g total) by mouth 4 (four) times daily -  with meals and at bedtime.     tamsulosin 0.4 MG Caps capsule  Commonly known as:  FLOMAX  Take 1 capsule (0.4 mg total) by mouth daily after supper.        Review of Systems  Constitutional: Positive for activity change and fatigue.  HENT: Negative.   Respiratory: Positive for chest tightness and shortness of breath. Negative for cough, choking and wheezing.   Cardiovascular: Negative for chest pain, palpitations and leg swelling.  Gastrointestinal: Negative.   Genitourinary: Negative.   Musculoskeletal: Negative.   Skin: Positive for wound (B-heel wounds).  Neurological: Positive for weakness.       C/o arm "heaviness"  Psychiatric/Behavioral: Negative.   All other systems reviewed and are negative.   Immunization History  Administered Date(s) Administered  . Influenza-Unspecified 03/04/2014, 03/16/2015  . Pneumococcal-Unspecified 01/11/2002, 05/10/2010  . Td 06/18/2005  . Zoster 02/06/2007   Pertinent  Health Maintenance Due  Topic Date Due  . OPHTHALMOLOGY EXAM  01/12/1935  . PNA vac Low Risk Adult (2 of 2 - PCV13) 05/11/2011  . INFLUENZA VACCINE  01/03/2016  . HEMOGLOBIN A1C  05/23/2016  . FOOT EXAM  09/26/2016  . URINE MICROALBUMIN  09/26/2016   Fall Risk  09/27/2015  Falls in the past year? Exclusion - non ambulatory   Functional Status Survey:    Filed Vitals:   12/15/15 1204  BP: 136/44  Pulse: 73  Temp: 97.4 F (36.3 C)  Resp: 18  SpO2: 94%   There is no weight on file to calculate BMI. Physical Exam  Constitutional: He is oriented  to person, place, and time. He appears well-developed and well-nourished. No distress.  HENT:  Head: Normocephalic and atraumatic.  Eyes: Conjunctivae and EOM are normal. Pupils are equal, round, and reactive to light.  Neck: Normal range of motion. Neck supple. No JVD present.  Cardiovascular: Normal rate, regular rhythm, normal heart sounds and intact distal pulses.  Exam reveals no gallop and no friction rub.   No murmur heard. Pulmonary/Chest: Effort normal. No respiratory distress. He has no wheezes. He has rales (faint- BLL). He exhibits no tenderness.  Abdominal: Soft. Bowel sounds are normal.  Neurological: He is alert and oriented to person, place, and time.  Skin: Skin is warm and dry. He is not diaphoretic. No pallor.  Psychiatric: He has a normal mood and affect. His behavior is normal. Judgment and thought content normal.  Nursing note and vitals reviewed.   Labs reviewed:  Recent Labs  11/23/15 0541 11/25/15 0500  12/08/15 0500 12/11/15 1110 12/13/15 0548  NA 139 138  < > 141 139 140  K 3.8 3.4*  < > 3.5 3.0* 3.9  CL 103 108  < > 115* 110 111  CO2 28 26  < > 23 24 25   GLUCOSE 157* 118*  < > 85 89 75  BUN 20 17  < > 9 6 6   CREATININE 1.18 1.03  < > 1.03 1.05 1.06  CALCIUM 8.7* 8.2*  < > 7.9* 8.0* 8.3*  MG 1.9 1.8  --  1.6*  --   --   < > = values in this interval not displayed.  Recent Labs  12/07/15 1136 12/11/15 1110 12/13/15 0548  AST 12* 10* 9*  ALT <5* <5* <5*  ALKPHOS 125 93 94  BILITOT 1.2 0.7 0.5  PROT 5.9* 4.6* 4.7*  ALBUMIN 2.8* 2.3* 2.1*    Recent Labs  11/29/15 1847  12/08/15 0500 12/11/15 1110 12/13/15 0548  WBC 11.1*  < > 4.6 5.9 5.6  NEUTROABS 8.2*  --   --  3.6 3.0  HGB 9.1*  < > 9.8* 10.6* 10.6*  HCT 27.2*  < > 29.0* 31.3* 30.9*  MCV 90.6  < > 90.8 90.0 89.7  PLT 266  < >  169 195 183  < > = values in this interval not displayed. Lab Results  Component Value Date   TSH 3.080 09/27/2015   Lab Results  Component Value Date     HGBA1C 7.5* 11/22/2015   Lab Results  Component Value Date   CHOL 146 09/27/2015   HDL 23* 09/27/2015   LDLCALC 73 09/27/2015   TRIG 252* 09/27/2015    Significant Diagnostic Results in last 30 days:  Ct Abdomen Pelvis Wo Contrast  12/07/2015  CLINICAL DATA:  Hypotension and slurred speech with nausea and fusion. Recent diagnosis of esophageal tear. EXAM: CT ABDOMEN AND PELVIS WITHOUT CONTRAST TECHNIQUE: Multidetector CT imaging of the abdomen and pelvis was performed following the standard protocol without IV contrast. COMPARISON:  07/28/2009 FINDINGS: Lower chest: Right lower lobe interstitial and airspace disease raises suspicion of pneumonia. Small bilateral pleural effusions are evident. Hepatobiliary: No focal abnormality in the liver on this study without intravenous contrast. No evidence of hepatomegaly. Multiple calcified stones are seen in the gallbladder. No intrahepatic or extrahepatic biliary dilation. Pancreas: Pancreas is diffusely fatty replaced. Multi a lobular cystic lesion in the tail the pancreas has progressed in the interval. Spleen: No splenomegaly. No focal mass lesion. Adrenals/Urinary Tract: No adrenal nodule or mass. Right kidney is atrophic with multiple low-density lesions compatible with cysts. Multiple cysts are noted in the left kidney with lower pole scarring. No hydroureteronephrosis. The urinary bladder appears normal for the degree of distention. Stomach/Bowel: Stomach is nondistended. No gastric wall thickening. No evidence of outlet obstruction. Duodenum is normally positioned as is the ligament of Treitz. No small bowel wall thickening. No small bowel dilatation. The terminal ileum is normal. Diverticular changes are noted in the left colon without evidence of diverticulitis. Vascular/Lymphatic: There is abdominal aortic atherosclerosis without aneurysm. There is no gastrohepatic or hepatoduodenal ligament lymphadenopathy. No intraperitoneal or retroperitoneal  lymphadenopathy. No pelvic sidewall lymphadenopathy. Reproductive: Prostate gland appears enlarged with probable TURP defect. Other: No intraperitoneal free fluid. Musculoskeletal: Stable appearance of trabecular coarsening in the left hemipelvis, likely a reflection of Paget's disease. IMPRESSION: 1. Right lower lobe interstitial and airspace disease suggests pneumonia. 2. Tiny bilateral pleural effusions. 3. Right renal atrophy with incompletely characterized cystic lesions in the kidneys bilaterally. No hydronephrosis. 4. Multi lobular cystic lesion in the tail the pancreas measuring up to 4.7 x 1.6 cm in total. This has progressed slightly since 07/28/2009. Electronically Signed   By: Misty Stanley M.D.   On: 12/07/2015 13:56   Dg Ankle 2 Views Left  12/07/2015  CLINICAL DATA:  Evaluate for osteomyelitis.  Hypotension EXAM: LEFT ANKLE - 2 VIEW COMPARISON:  07/22/2015 FINDINGS: Wound VAC along the lateral ankle. No soft tissue emphysema or signs of acute osteomyelitis. Negative for ankle joint effusion. Osteopenia and atherosclerosis. IMPRESSION: Negative for osteomyelitis or soft tissue emphysema. Electronically Signed   By: Monte Fantasia M.D.   On: 12/07/2015 13:05   Dg Ankle 2 Views Right  12/07/2015  CLINICAL DATA:  Pain. EXAM: RIGHT ANKLE - 2 VIEW COMPARISON:  None. FINDINGS: Two-view exam shows no gross fracture or dislocation. Orientation of the lateral malleolus to the ankle mortise cannot be discerned on this two-view oblique study. No worrisome lytic or sclerotic osseous abnormality. IMPRESSION: Limited study without gross bony abnormality. Electronically Signed   By: Misty Stanley M.D.   On: 12/07/2015 13:03   Dg Chest Portable 1 View  12/07/2015  CLINICAL DATA:  Sepsis.  Hypotension EXAM: PORTABLE CHEST 1 VIEW COMPARISON:  11/30/2015  FINDINGS: Right upper extremity PICC with tip at the upper right atrium. Low volume chest with chronic elevation of the right diaphragm. Curvilinear lucency at  the right base favors atelectasis, pneumoperitoneum not excluded. There is no edema, consolidation, effusion, or pneumothorax. These results were called by telephone at the time of interpretation on 12/07/2015 at 12:59 pm to Dr. Rudene Re , who verbally acknowledged these results. IMPRESSION: 1. Right basilar lucency could be from atelectasis or pneumoperitoneum. If abdominal pain, recommended CT. 2. No evidence of acute cardiopulmonary disease. Electronically Signed   By: Monte Fantasia M.D.   On: 12/07/2015 13:00   Dg Chest Port 1 View  11/30/2015  CLINICAL DATA:  GI bleed. EXAM: PORTABLE CHEST 1 VIEW COMPARISON:  November 08, 2015 FINDINGS: The right PICC line is stable. There is elevation of the right hemidiaphragm. Increased interstitial markings suggest mild edema. The heart, hila, mediastinum, lungs, and pleura are otherwise unchanged. IMPRESSION: Mild edema. Electronically Signed   By: Dorise Bullion III M.D   On: 11/30/2015 01:32   Dg Foot 2 Views Left  12/07/2015  CLINICAL DATA:  Hypotension.  Evaluate for osteomyelitis. EXAM: LEFT FOOT - 2 VIEW COMPARISON:  11/04/2015 FINDINGS: Interval transmetatarsal amputation to the first ray. Remote distal fifth metatarsal resection. No generalized soft tissue emphysema. Unremarkable appearance of the osteotomies. Osteopenia and atherosclerosis. IMPRESSION: Unremarkable appearance of interval first ray transmetatarsal amputation. No indication of acute osteomyelitis. Electronically Signed   By: Monte Fantasia M.D.   On: 12/07/2015 13:04   Dg Foot 2 Views Right  12/07/2015  CLINICAL DATA:  Pain. EXAM: RIGHT FOOT - 2 VIEW COMPARISON:  None. FINDINGS: Limited two views study with superimposition of bony anatomy. No gross fracture evident. No evidence for frank dislocation. Bones are demineralized. No worrisome lytic or sclerotic osseous abnormality evident. IMPRESSION: Negative. Electronically Signed   By: Misty Stanley M.D.   On: 12/07/2015 13:04     Assessment/Plan 1. Hypoxia  Continue on O2 2L [place] to maintain sats > 92%  Check labs, cxr  RUE duplex doppler d/t presence of PICC line  Encourage IS/TCDB  OOB sitting upright as much as possible  Family/ staff Communication:   Total Time: 35 minutes  Documentation: 20 minutes  Face to Face: 15 minutes  Family/Phone:   Labs/tests ordered:  CBC, Met C, ESR, CRP, 2V CXR. RUE Doppler  Vikki Ports, NP-C Geriatrics [facility] [phone], [place] [address] Cell Phone (Mon-Fri 8am-5pm):  [phone] On Call:  [phone] & follow prompts after 5pm & weekends Office Phone:  [phone] Office Fax:  [phone]

## 2015-12-18 DIAGNOSIS — E876 Hypokalemia: Secondary | ICD-10-CM | POA: Diagnosis not present

## 2015-12-18 LAB — BASIC METABOLIC PANEL
ANION GAP: 7 (ref 5–15)
BUN: 13 mg/dL (ref 6–20)
CALCIUM: 8.2 mg/dL — AB (ref 8.9–10.3)
CO2: 26 mmol/L (ref 22–32)
Chloride: 106 mmol/L (ref 101–111)
Creatinine, Ser: 1.4 mg/dL — ABNORMAL HIGH (ref 0.61–1.24)
GFR, EST AFRICAN AMERICAN: 50 mL/min — AB (ref >60)
GFR, EST NON AFRICAN AMERICAN: 43 mL/min — AB (ref >60)
GLUCOSE: 159 mg/dL — AB (ref 65–99)
Potassium: 4.3 mmol/L (ref 3.5–5.1)
SODIUM: 139 mmol/L (ref 135–145)

## 2015-12-18 LAB — VANCOMYCIN, TROUGH: VANCOMYCIN TR: 25 ug/mL — AB (ref 15–20)

## 2015-12-19 DIAGNOSIS — E876 Hypokalemia: Secondary | ICD-10-CM | POA: Diagnosis not present

## 2015-12-19 LAB — CBC WITH DIFFERENTIAL/PLATELET
Basophils Absolute: 0.1 10*3/uL (ref 0–0.1)
Basophils Relative: 1 %
EOS ABS: 0.9 10*3/uL — AB (ref 0–0.7)
EOS PCT: 14 %
HCT: 30.5 % — ABNORMAL LOW (ref 40.0–52.0)
Hemoglobin: 10.2 g/dL — ABNORMAL LOW (ref 13.0–18.0)
LYMPHS ABS: 0.9 10*3/uL — AB (ref 1.0–3.6)
Lymphocytes Relative: 13 %
MCH: 30.5 pg (ref 26.0–34.0)
MCHC: 33.5 g/dL (ref 32.0–36.0)
MCV: 90.9 fL (ref 80.0–100.0)
MONO ABS: 0.5 10*3/uL (ref 0.2–1.0)
MONOS PCT: 7 %
Neutro Abs: 4.2 10*3/uL (ref 1.4–6.5)
Neutrophils Relative %: 65 %
PLATELETS: 202 10*3/uL (ref 150–440)
RBC: 3.35 MIL/uL — ABNORMAL LOW (ref 4.40–5.90)
RDW: 16.9 % — AB (ref 11.5–14.5)
WBC: 6.5 10*3/uL (ref 3.8–10.6)

## 2015-12-19 LAB — COMPREHENSIVE METABOLIC PANEL
ALT: 6 U/L — ABNORMAL LOW (ref 17–63)
ANION GAP: 6 (ref 5–15)
AST: 20 U/L (ref 15–41)
Albumin: 2 g/dL — ABNORMAL LOW (ref 3.5–5.0)
Alkaline Phosphatase: 80 U/L (ref 38–126)
BUN: 11 mg/dL (ref 6–20)
CHLORIDE: 109 mmol/L (ref 101–111)
CO2: 25 mmol/L (ref 22–32)
Calcium: 8 mg/dL — ABNORMAL LOW (ref 8.9–10.3)
Creatinine, Ser: 1.42 mg/dL — ABNORMAL HIGH (ref 0.61–1.24)
GFR calc Af Amer: 49 mL/min — ABNORMAL LOW (ref >60)
GFR, EST NON AFRICAN AMERICAN: 43 mL/min — AB (ref >60)
GLUCOSE: 132 mg/dL — AB (ref 65–99)
POTASSIUM: 4 mmol/L (ref 3.5–5.1)
SODIUM: 140 mmol/L (ref 135–145)
Total Bilirubin: 0.5 mg/dL (ref 0.3–1.2)
Total Protein: 4.6 g/dL — ABNORMAL LOW (ref 6.5–8.1)

## 2015-12-19 LAB — VANCOMYCIN, TROUGH: Vancomycin Tr: 22 ug/mL (ref 15–20)

## 2015-12-20 ENCOUNTER — Non-Acute Institutional Stay (SKILLED_NURSING_FACILITY): Payer: PPO | Admitting: Gerontology

## 2015-12-20 DIAGNOSIS — L97429 Non-pressure chronic ulcer of left heel and midfoot with unspecified severity: Secondary | ICD-10-CM | POA: Diagnosis not present

## 2015-12-20 DIAGNOSIS — E876 Hypokalemia: Secondary | ICD-10-CM | POA: Diagnosis not present

## 2015-12-20 LAB — VANCOMYCIN, TROUGH: VANCOMYCIN TR: 15 ug/mL (ref 15–20)

## 2015-12-20 LAB — BASIC METABOLIC PANEL
Anion gap: 4 — ABNORMAL LOW (ref 5–15)
BUN: 11 mg/dL (ref 6–20)
CHLORIDE: 109 mmol/L (ref 101–111)
CO2: 27 mmol/L (ref 22–32)
CREATININE: 1.43 mg/dL — AB (ref 0.61–1.24)
Calcium: 8 mg/dL — ABNORMAL LOW (ref 8.9–10.3)
GFR calc Af Amer: 49 mL/min — ABNORMAL LOW (ref >60)
GFR calc non Af Amer: 42 mL/min — ABNORMAL LOW (ref >60)
Glucose, Bld: 165 mg/dL — ABNORMAL HIGH (ref 65–99)
Potassium: 3.8 mmol/L (ref 3.5–5.1)
SODIUM: 140 mmol/L (ref 135–145)

## 2015-12-20 NOTE — Progress Notes (Signed)
Location:  [facility] Service:  SNF [phone]) Provider:  Toni Arthurs, NP-C  Sela Hua, PA-C  Patient Care Team: Sela Hua, PA-C as PCP - General (Physician Assistant) Katha Cabal, MD (Vascular Surgery) Minna Merritts, MD as Consulting Physician (Cardiology) Beverly Gust, MD (Unknown Physician Specialty) Anabel Bene, MD as Referring Physician (Neurology) Duanne Guess, PA-C as Physician Assistant (Orthopedic Surgery) Minna Merritts, MD as Consulting Physician (Cardiology)  Extended Emergency Contact Information Primary Emergency Contact: Wells Guiles of [place] Phone: [phone] Relation: Daughter Secondary Emergency Contact: Leim Fabry [place] of [place] Phone: [phone] Mobile Phone: [phone] Relation: Son  Code Status:  DNR Goals of care: Advanced Directive information Advanced Directives 12/07/2015  Does patient have an advance directive? Yes  Type of Paramedic of [place];Living will;Out of facility DNR (pink MOST or yellow form)  Does patient want to make changes to advanced directive? -  Copy of advanced directive(s) in chart? -  Pre-existing out of facility DNR order (yellow form or pink MOST form) -     Chief Complaint  Patient presents with  . Wound Check    HPI:  Pt is a 82 y.o. male seen today for an acute visit for wound check. Wound vac was due to be changed today. Wound nurse asked me to eval heels. Wound had been beefy red with minor bleeding of the wound bed. Today, wound bed is covered with yellow slough. No odor. Minimal drainage.   Past Medical History  Diagnosis Date  . Coronary artery disease   . Diabetes mellitus without complication ([place])   . Aortic stenosis   . Hypertension   . Hyperlipidemia   . TIA (transient ischemic attack)   . Thyroid disease   . Hypothyroidism    Past Surgical History  Procedure  Laterality Date  . Appendectomy    . Hernia repair    . Knee surgery      bilateral  . Eye surgery    . Colonoscopy    . Cardiac catheterization  2010    showed a patient circumflex stent with noncritical CAD and normal right-sided pressures.  . Coronary angioplasty  01/24/2007    stent placement to the mid circumflex   . Knee surgery Left   . Peripheral vascular catheterization N/A 08/30/2015    Procedure: Abdominal Aortogram w/Lower Extremity;  Surgeon: Katha Cabal, MD;  Location: [place] [facility];  Service: Cardiovascular;  Laterality: N/A;  . Peripheral vascular catheterization  08/30/2015    Procedure: Lower Extremity Intervention;  Surgeon: Katha Cabal, MD;  Location: [place] [facility];  Service: Cardiovascular;;  . Amputation toe Left 11/05/2015    Procedure: AMPUTATION TOE;  Surgeon: Albertine Patricia, DPM;  Location: [facility];  Service: Podiatry;  Laterality: Left;  . Irrigation and debridement foot Left 11/05/2015    Procedure: IRRIGATION AND DEBRIDEMENT FOOT / HEEL ULCER;  Surgeon: Albertine Patricia, DPM;  Location: [facility];  Service: Podiatry;  Laterality: Left;  . Application of wound vac Left 11/05/2015    Procedure: APPLICATION OF WOUND VAC;  Surgeon: Albertine Patricia, DPM;  Location: [facility];  Service: Podiatry;  Laterality: Left;  . Peripheral vascular catheterization Left 11/23/2015    Procedure: Abdominal Aortogram w/Lower Extremity;  Surgeon: Katha Cabal, MD;  Location: [place] [facility];  Service: Cardiovascular;  Laterality: Left;  . Peripheral vascular catheterization  11/23/2015    Procedure: Lower  Extremity Intervention;  Surgeon: Katha Cabal, MD;  Location: [place] [facility];  Service: Cardiovascular;;  . Irrigation and debridement foot Left 11/24/2015    Procedure: IRRIGATION AND DEBRIDEMENT FOOT;  Surgeon: Albertine Patricia, DPM;  Location: [facility];  Service: Podiatry;  Laterality: Left;  . Peripheral vascular catheterization Right  11/25/2015    Procedure: Lower Extremity Angiography;  Surgeon: Katha Cabal, MD;  Location: [place] [facility];  Service: Cardiovascular;  Laterality: Right;  . Esophagogastroduodenoscopy N/A 11/30/2015    Procedure: ESOPHAGOGASTRODUODENOSCOPY (EGD);  Surgeon: Wilford Corner, MD;  Location: [facility];  Service: Endoscopy;  Laterality: N/A;  . Esophageal tear      No Known Allergies    Medication List       This list is accurate as of: 12/20/15  9:25 PM.  Always use your most recent med list.               amiodarone 100 MG tablet  Commonly known as:  PACERONE  Take 100 mg by mouth daily.     carbidopa-levodopa 25-100 MG tablet  Commonly known as:  SINEMET IR  Take 1 tablet by mouth 4 (four) times daily.     colchicine 0.6 MG tablet  Take 0.6 mg by mouth daily.     feeding supplement (GLUCERNA SHAKE) Liqd  Take 237 mLs by mouth 3 (three) times daily between meals.     furosemide 40 MG tablet  Commonly known as:  LASIX     heparin flush 10 UNIT/ML Soln injection  Inject 10 Units into the vein 4 (four) times daily.     levothyroxine 150 MCG tablet  Commonly known as:  SYNTHROID, LEVOTHROID  Take 150 mcg by mouth daily before breakfast.     misoprostol 100 MCG tablet  Commonly known as:  CYTOTEC  Take 100 mcg by mouth 4 (four) times daily.     oxyCODONE-acetaminophen 5-325 MG tablet  Commonly known as:  PERCOCET/ROXICET  Take 1 tablet by mouth every 4 (four) hours as needed for moderate pain.     pantoprazole 40 MG tablet  Commonly known as:  PROTONIX  Take 1 tablet (40 mg total) by mouth 2 (two) times daily.     potassium chloride 10 MEQ tablet  Commonly known as:  K-DUR,KLOR-CON  Take 10 mEq by mouth daily.     pramipexole 1 MG tablet  Commonly known as:  MIRAPEX  Take 1 mg by mouth 4 (four) times daily.     sitaGLIPtin 100 MG tablet  Commonly known as:  JANUVIA  Take 1 tablet (100 mg total) by mouth daily.     sodium chloride flush 0.9 %  Soln injection  Inject 10 mLs into the vein 4 (four) times daily.     sucralfate 1 g tablet  Commonly known as:  CARAFATE  Take 1 tablet (1 g total) by mouth 4 (four) times daily -  with meals and at bedtime.     tamsulosin 0.4 MG Caps capsule  Commonly known as:  FLOMAX  Take 1 capsule (0.4 mg total) by mouth daily after supper.        Review of Systems  Constitutional: Negative for fever, chills, diaphoresis and fatigue.  Respiratory: Negative for cough and shortness of breath.   Cardiovascular: Negative for chest pain and leg swelling.  Genitourinary: Negative.   Musculoskeletal: Negative.   Skin: Positive for wound.  Neurological: Negative.   Psychiatric/Behavioral: Negative.   All other systems reviewed and are negative.  Immunization History  Administered Date(s) Administered  . Influenza-Unspecified 03/04/2014, 03/16/2015  . Pneumococcal-Unspecified 01/11/2002, 05/10/2010  . Td 06/18/2005  . Zoster 02/06/2007   Pertinent  Health Maintenance Due  Topic Date Due  . OPHTHALMOLOGY EXAM  01/12/1935  . PNA vac Low Risk Adult (2 of 2 - PCV13) 05/11/2011  . INFLUENZA VACCINE  01/03/2016  . HEMOGLOBIN A1C  05/23/2016  . FOOT EXAM  09/26/2016  . URINE MICROALBUMIN  09/26/2016   Fall Risk  09/27/2015  Falls in the past year? Exclusion - non ambulatory   Functional Status Survey:    There were no vitals filed for this visit. There is no weight on file to calculate BMI. Physical Exam  Constitutional: He is oriented to person, place, and time. He appears well-developed and well-nourished. No distress.  Pulmonary/Chest: Effort normal. No respiratory distress. He exhibits no tenderness.  Neurological: He is alert and oriented to person, place, and time.  Skin: Skin is warm and dry. He is not diaphoretic. There is pallor.     Wound bed covered with yellow slough. No odor.  Psychiatric: He has a normal mood and affect. His behavior is normal. Judgment and thought  content normal.  Nursing note and vitals reviewed.   Labs reviewed:  Recent Labs  11/23/15 0541 11/25/15 0500  12/08/15 0500  12/15/15 1323 12/18/15 2050 12/19/15 1445  NA 139 138  < > 141  < > 140 139 140  K 3.8 3.4*  < > 3.5  < > 3.4* 4.3 4.0  CL 103 108  < > 115*  < > 108 106 109  CO2 28 26  < > 23  < > 25 26 25   GLUCOSE 157* 118*  < > 85  < > 132* 159* 132*  BUN 20 17  < > 9  < > 9 13 11   CREATININE 1.18 1.03  < > 1.03  < > 1.36* 1.40* 1.42*  CALCIUM 8.7* 8.2*  < > 7.9*  < > 8.1* 8.2* 8.0*  MG 1.9 1.8  --  1.6*  --   --   --   --   < > = values in this interval not displayed.  Recent Labs  12/13/15 0548 12/15/15 1323 12/19/15 1445  AST 9* 10* 20  ALT <5* <5* 6*  ALKPHOS 94 91 80  BILITOT 0.5 1.0 0.5  PROT 4.7* 4.8* 4.6*  ALBUMIN 2.1* 2.2* 2.0*    Recent Labs  12/13/15 0548 12/15/15 1323 12/19/15 1445  WBC 5.6 6.1 6.5  NEUTROABS 3.0 4.1 4.2  HGB 10.6* 10.5* 10.2*  HCT 30.9* 31.8* 30.5*  MCV 89.7 91.3 90.9  PLT 183 193 202   Lab Results  Component Value Date   TSH 3.080 09/27/2015   Lab Results  Component Value Date   HGBA1C 7.5* 11/22/2015   Lab Results  Component Value Date   CHOL 146 09/27/2015   HDL 23* 09/27/2015   LDLCALC 73 09/27/2015   TRIG 252* 09/27/2015    Significant Diagnostic Results in last 30 days:  Ct Abdomen Pelvis Wo Contrast  12/07/2015  CLINICAL DATA:  Hypotension and slurred speech with nausea and fusion. Recent diagnosis of esophageal tear. EXAM: CT ABDOMEN AND PELVIS WITHOUT CONTRAST TECHNIQUE: Multidetector CT imaging of the abdomen and pelvis was performed following the standard protocol without IV contrast. COMPARISON:  07/28/2009 FINDINGS: Lower chest: Right lower lobe interstitial and airspace disease raises suspicion of pneumonia. Small bilateral pleural effusions are evident. Hepatobiliary: No focal abnormality in  the liver on this study without intravenous contrast. No evidence of hepatomegaly. Multiple calcified  stones are seen in the gallbladder. No intrahepatic or extrahepatic biliary dilation. Pancreas: Pancreas is diffusely fatty replaced. Multi a lobular cystic lesion in the tail the pancreas has progressed in the interval. Spleen: No splenomegaly. No focal mass lesion. Adrenals/Urinary Tract: No adrenal nodule or mass. Right kidney is atrophic with multiple low-density lesions compatible with cysts. Multiple cysts are noted in the left kidney with lower pole scarring. No hydroureteronephrosis. The urinary bladder appears normal for the degree of distention. Stomach/Bowel: Stomach is nondistended. No gastric wall thickening. No evidence of outlet obstruction. Duodenum is normally positioned as is the ligament of Treitz. No small bowel wall thickening. No small bowel dilatation. The terminal ileum is normal. Diverticular changes are noted in the left colon without evidence of diverticulitis. Vascular/Lymphatic: There is abdominal aortic atherosclerosis without aneurysm. There is no gastrohepatic or hepatoduodenal ligament lymphadenopathy. No intraperitoneal or retroperitoneal lymphadenopathy. No pelvic sidewall lymphadenopathy. Reproductive: Prostate gland appears enlarged with probable TURP defect. Other: No intraperitoneal free fluid. Musculoskeletal: Stable appearance of trabecular coarsening in the left hemipelvis, likely a reflection of Paget's disease. IMPRESSION: 1. Right lower lobe interstitial and airspace disease suggests pneumonia. 2. Tiny bilateral pleural effusions. 3. Right renal atrophy with incompletely characterized cystic lesions in the kidneys bilaterally. No hydronephrosis. 4. Multi lobular cystic lesion in the tail the pancreas measuring up to 4.7 x 1.6 cm in total. This has progressed slightly since 07/28/2009. Electronically Signed   By: Misty Stanley M.D.   On: 12/07/2015 13:56   Dg Ankle 2 Views Left  12/07/2015  CLINICAL DATA:  Evaluate for osteomyelitis.  Hypotension EXAM: LEFT ANKLE - 2  VIEW COMPARISON:  07/22/2015 FINDINGS: Wound VAC along the lateral ankle. No soft tissue emphysema or signs of acute osteomyelitis. Negative for ankle joint effusion. Osteopenia and atherosclerosis. IMPRESSION: Negative for osteomyelitis or soft tissue emphysema. Electronically Signed   By: Monte Fantasia M.D.   On: 12/07/2015 13:05   Dg Ankle 2 Views Right  12/07/2015  CLINICAL DATA:  Pain. EXAM: RIGHT ANKLE - 2 VIEW COMPARISON:  None. FINDINGS: Two-view exam shows no gross fracture or dislocation. Orientation of the lateral malleolus to the ankle mortise cannot be discerned on this two-view oblique study. No worrisome lytic or sclerotic osseous abnormality. IMPRESSION: Limited study without gross bony abnormality. Electronically Signed   By: Misty Stanley M.D.   On: 12/07/2015 13:03   Dg Chest Portable 1 View  12/07/2015  CLINICAL DATA:  Sepsis.  Hypotension EXAM: PORTABLE CHEST 1 VIEW COMPARISON:  11/30/2015 FINDINGS: Right upper extremity PICC with tip at the upper right atrium. Low volume chest with chronic elevation of the right diaphragm. Curvilinear lucency at the right base favors atelectasis, pneumoperitoneum not excluded. There is no edema, consolidation, effusion, or pneumothorax. These results were called by telephone at the time of interpretation on 12/07/2015 at 12:59 pm to Dr. Rudene Re , who verbally acknowledged these results. IMPRESSION: 1. Right basilar lucency could be from atelectasis or pneumoperitoneum. If abdominal pain, recommended CT. 2. No evidence of acute cardiopulmonary disease. Electronically Signed   By: Monte Fantasia M.D.   On: 12/07/2015 13:00   Dg Chest Port 1 View  11/30/2015  CLINICAL DATA:  GI bleed. EXAM: PORTABLE CHEST 1 VIEW COMPARISON:  November 08, 2015 FINDINGS: The right PICC line is stable. There is elevation of the right hemidiaphragm. Increased interstitial markings suggest mild edema. The heart, hila, mediastinum, lungs,  and pleura are otherwise unchanged.  IMPRESSION: Mild edema. Electronically Signed   By: Dorise Bullion III M.D   On: 11/30/2015 01:32   Dg Foot 2 Views Left  12/07/2015  CLINICAL DATA:  Hypotension.  Evaluate for osteomyelitis. EXAM: LEFT FOOT - 2 VIEW COMPARISON:  11/04/2015 FINDINGS: Interval transmetatarsal amputation to the first ray. Remote distal fifth metatarsal resection. No generalized soft tissue emphysema. Unremarkable appearance of the osteotomies. Osteopenia and atherosclerosis. IMPRESSION: Unremarkable appearance of interval first ray transmetatarsal amputation. No indication of acute osteomyelitis. Electronically Signed   By: Monte Fantasia M.D.   On: 12/07/2015 13:04   Dg Foot 2 Views Right  12/07/2015  CLINICAL DATA:  Pain. EXAM: RIGHT FOOT - 2 VIEW COMPARISON:  None. FINDINGS: Limited two views study with superimposition of bony anatomy. No gross fracture evident. No evidence for frank dislocation. Bones are demineralized. No worrisome lytic or sclerotic osseous abnormality evident. IMPRESSION: Negative. Electronically Signed   By: Misty Stanley M.D.   On: 12/07/2015 13:04    Assessment/Plan 1. Ulcer of left heel, with unspecified severity (HCC)  Santyl ointment- thick layer to wound bed, cover with NS damp to dry dressing. Change BID to promote autolysis of slough  Resume woundvac once slough is debrided  Hold tonight's dose of Vancomycin d/t critical high van trough  F/U Met C and vanc trough- pharmacy to dose  0.9% NS at 100 mL/ hr for hydration/ renal support  Prevelon boots at all times   Family/ staff Communication:   Total Time: 20 minutes  Documentation: 15 minutes  Face to Face: 5 minutes  Family/Phone:   Labs/tests ordered:  Met C, Vanc trough  Vikki Ports, NP-C [facility] [address], [place] [address] Cell Phone (Mon-Fri 8am-5pm):  [phone] On Call:  [phone] & follow prompts after 5pm & weekends Office Phone:   [phone] Office Fax:  [phone]

## 2015-12-21 DIAGNOSIS — E876 Hypokalemia: Secondary | ICD-10-CM | POA: Diagnosis not present

## 2015-12-21 LAB — VANCOMYCIN, TROUGH: Vancomycin Tr: 6 ug/mL — ABNORMAL LOW (ref 15–20)

## 2015-12-21 LAB — BUN: BUN: 9 mg/dL (ref 6–20)

## 2015-12-21 LAB — CREATININE, SERUM
Creatinine, Ser: 1.21 mg/dL (ref 0.61–1.24)
GFR calc Af Amer: 60 mL/min — ABNORMAL LOW (ref >60)
GFR calc non Af Amer: 52 mL/min — ABNORMAL LOW (ref >60)

## 2015-12-26 DIAGNOSIS — E876 Hypokalemia: Secondary | ICD-10-CM | POA: Diagnosis not present

## 2015-12-27 LAB — BASIC METABOLIC PANEL
Anion gap: 5 (ref 5–15)
BUN: 7 mg/dL (ref 6–20)
CO2: 23 mmol/L (ref 22–32)
CREATININE: 1.22 mg/dL (ref 0.61–1.24)
Calcium: 7.5 mg/dL — ABNORMAL LOW (ref 8.9–10.3)
Chloride: 112 mmol/L — ABNORMAL HIGH (ref 101–111)
GFR calc Af Amer: 59 mL/min — ABNORMAL LOW (ref >60)
GFR, EST NON AFRICAN AMERICAN: 51 mL/min — AB (ref >60)
Glucose, Bld: 120 mg/dL — ABNORMAL HIGH (ref 65–99)
Potassium: 3.1 mmol/L — ABNORMAL LOW (ref 3.5–5.1)
SODIUM: 140 mmol/L (ref 135–145)

## 2015-12-27 LAB — VANCOMYCIN, TROUGH: VANCOMYCIN TR: 13 ug/mL — AB (ref 15–20)

## 2015-12-28 DIAGNOSIS — E876 Hypokalemia: Secondary | ICD-10-CM | POA: Diagnosis not present

## 2015-12-28 LAB — POTASSIUM: POTASSIUM: 3.2 mmol/L — AB (ref 3.5–5.1)

## 2015-12-30 DIAGNOSIS — E876 Hypokalemia: Secondary | ICD-10-CM | POA: Diagnosis not present

## 2015-12-30 LAB — VANCOMYCIN, TROUGH: Vancomycin Tr: 12 ug/mL — ABNORMAL LOW (ref 15–20)

## 2016-01-02 ENCOUNTER — Ambulatory Visit: Payer: PPO | Admitting: Family Medicine

## 2016-01-02 ENCOUNTER — Non-Acute Institutional Stay (SKILLED_NURSING_FACILITY): Payer: PPO | Admitting: Gerontology

## 2016-01-02 DIAGNOSIS — I5032 Chronic diastolic (congestive) heart failure: Secondary | ICD-10-CM

## 2016-01-02 DIAGNOSIS — R238 Other skin changes: Secondary | ICD-10-CM | POA: Diagnosis not present

## 2016-01-02 DIAGNOSIS — R239 Unspecified skin changes: Secondary | ICD-10-CM

## 2016-01-03 ENCOUNTER — Encounter
Admission: RE | Admit: 2016-01-03 | Discharge: 2016-01-03 | Disposition: A | Discharge status: Home / Self Care | Payer: PPO | Source: Ambulatory Visit | Attending: Internal Medicine | Admitting: Internal Medicine

## 2016-01-03 DIAGNOSIS — D649 Anemia, unspecified: Secondary | ICD-10-CM | POA: Diagnosis not present

## 2016-01-03 DIAGNOSIS — E876 Hypokalemia: Secondary | ICD-10-CM | POA: Diagnosis not present

## 2016-01-03 DIAGNOSIS — E119 Type 2 diabetes mellitus without complications: Secondary | ICD-10-CM | POA: Insufficient documentation

## 2016-01-03 LAB — CBC WITH DIFFERENTIAL/PLATELET
BASOS PCT: 1 %
Basophils Absolute: 0 10*3/uL (ref 0–0.1)
EOS ABS: 0.3 10*3/uL (ref 0–0.7)
Eosinophils Relative: 5 %
HEMATOCRIT: 30.6 % — AB (ref 40.0–52.0)
Hemoglobin: 10.2 g/dL — ABNORMAL LOW (ref 13.0–18.0)
Lymphocytes Relative: 13 %
Lymphs Abs: 0.8 10*3/uL — ABNORMAL LOW (ref 1.0–3.6)
MCH: 29.4 pg (ref 26.0–34.0)
MCHC: 33.3 g/dL (ref 32.0–36.0)
MCV: 88.3 fL (ref 80.0–100.0)
MONO ABS: 0.4 10*3/uL (ref 0.2–1.0)
MONOS PCT: 7 %
NEUTROS ABS: 4.7 10*3/uL (ref 1.4–6.5)
Neutrophils Relative %: 74 %
Platelets: 268 10*3/uL (ref 150–440)
RBC: 3.46 MIL/uL — ABNORMAL LOW (ref 4.40–5.90)
RDW: 16.8 % — AB (ref 11.5–14.5)
WBC: 6.3 10*3/uL (ref 3.8–10.6)

## 2016-01-03 LAB — COMPREHENSIVE METABOLIC PANEL
ALK PHOS: 74 U/L (ref 38–126)
ALT: <5 U/L — ABNORMAL LOW (ref 17–63)
ANION GAP: 4 — AB (ref 5–15)
AST: 12 U/L — ABNORMAL LOW (ref 15–41)
Albumin: 2 g/dL — ABNORMAL LOW (ref 3.5–5.0)
BUN: <5 mg/dL — ABNORMAL LOW (ref 6–20)
CALCIUM: 7.7 mg/dL — AB (ref 8.9–10.3)
CO2: 26 mmol/L (ref 22–32)
Chloride: 115 mmol/L — ABNORMAL HIGH (ref 101–111)
Creatinine, Ser: 1.1 mg/dL (ref 0.61–1.24)
GFR, EST NON AFRICAN AMERICAN: 58 mL/min — AB (ref >60)
Glucose, Bld: 127 mg/dL — ABNORMAL HIGH (ref 65–99)
Potassium: 2.9 mmol/L — ABNORMAL LOW (ref 3.5–5.1)
SODIUM: 145 mmol/L (ref 135–145)
TOTAL PROTEIN: 4.4 g/dL — AB (ref 6.5–8.1)
Total Bilirubin: 0.4 mg/dL (ref 0.3–1.2)

## 2016-01-03 LAB — BASIC METABOLIC PANEL
Anion gap: 4 — ABNORMAL LOW (ref 5–15)
BUN: 5 mg/dL — ABNORMAL LOW (ref 6–20)
CALCIUM: 7.5 mg/dL — AB (ref 8.9–10.3)
CHLORIDE: 116 mmol/L — AB (ref 101–111)
CO2: 24 mmol/L (ref 22–32)
CREATININE: 1 mg/dL (ref 0.61–1.24)
Glucose, Bld: 122 mg/dL — ABNORMAL HIGH (ref 65–99)
Potassium: 3.1 mmol/L — ABNORMAL LOW (ref 3.5–5.1)
SODIUM: 144 mmol/L (ref 135–145)

## 2016-01-03 LAB — VANCOMYCIN, TROUGH
VANCOMYCIN TR: 24 ug/mL — AB (ref 15–20)
VANCOMYCIN TR: 19 ug/mL (ref 15–20)

## 2016-01-03 LAB — SEDIMENTATION RATE: Sed Rate: 33 mm/hr — ABNORMAL HIGH (ref 0–20)

## 2016-01-03 NOTE — Progress Notes (Signed)
Location:      Place of Service:  SNF (31) Provider:  Lorenso Quarry, NP-C  Tonette Lederer, PA-C  Patient Care Team: Tonette Lederer, PA-C as PCP - General (Physician Assistant) Renford Dills, MD (Vascular Surgery) Antonieta Iba, MD as Consulting Physician (Cardiology) Linus Salmons, MD (Unknown Physician Specialty) Morene Crocker, MD as Referring Physician (Neurology) Evon Slack, PA-C as Physician Assistant (Orthopedic Surgery) Antonieta Iba, MD as Consulting Physician (Cardiology)  Extended Emergency Contact Information Primary Emergency Contact: Nanda [place] of [place] Home Phone: [phone] Relation: Daughter Secondary Emergency Contact: Bronson [place] of [place] Home Phone: [phone] Mobile Phone: [phone] Relation: Son  Code Status: DNR Goals of care: Advanced Directive information Advanced Directives 12/07/2015  Does patient have an advance directive? Yes  Type of Estate agent of [place];Living will;Out of facility DNR (pink MOST or yellow form)  Does patient want to make changes to advanced directive? -  Copy of advanced directive(s) in chart? -  Pre-existing out of facility DNR order (yellow form or pink MOST form) -     Chief Complaint  Patient presents with  . Follow-up    HPI:  Pt is a 82 y.o. male seen today for an acute visit for follow up to evaluate his CHF and wound on the sacrum. He reports he is feeling better as of late. Continues to be weak. Nursing reported some pedal edema over the weekend. He has been receiving IVF to support hydration while on IV antibiotics. He reports no cough, congestion, dyspnea; denies chest pains. Pt reports painful sacrum. He had to lay down and not sit up in the chair as long today due to painful sacrum.     Past Medical History:  Diagnosis Date  . Aortic stenosis   . Coronary artery disease   . Diabetes mellitus without complication  (HCC)   . Hyperlipidemia   . Hypertension   . Hypothyroidism   . Thyroid disease   . TIA (transient ischemic attack)    Past Surgical History:  Procedure Laterality Date  . AMPUTATION TOE Left 11/05/2015   Procedure: AMPUTATION TOE;  Surgeon: Recardo Evangelist, DPM;  Location: [facility];  Service: Podiatry;  Laterality: Left;  . APPENDECTOMY    . APPLICATION OF WOUND VAC Left 11/05/2015   Procedure: APPLICATION OF WOUND VAC;  Surgeon: Recardo Evangelist, DPM;  Location: [facility];  Service: Podiatry;  Laterality: Left;  . CARDIAC CATHETERIZATION  2010   showed a patient circumflex stent with noncritical CAD and normal right-sided pressures.  . COLONOSCOPY    . CORONARY ANGIOPLASTY  01/24/2007   stent placement to the mid circumflex   . esophageal tear    . ESOPHAGOGASTRODUODENOSCOPY N/A 11/30/2015   Procedure: ESOPHAGOGASTRODUODENOSCOPY (EGD);  Surgeon: Charlott Rakes, MD;  Location: [facility];  Service: Endoscopy;  Laterality: N/A;  . EYE SURGERY    . HERNIA REPAIR    . IRRIGATION AND DEBRIDEMENT FOOT Left 11/05/2015   Procedure: IRRIGATION AND DEBRIDEMENT FOOT / HEEL ULCER;  Surgeon: Recardo Evangelist, DPM;  Location: [facility];  Service: Podiatry;  Laterality: Left;  . IRRIGATION AND DEBRIDEMENT FOOT Left 11/24/2015   Procedure: IRRIGATION AND DEBRIDEMENT FOOT;  Surgeon: Recardo Evangelist, DPM;  Location: [facility];  Service: Podiatry;  Laterality: Left;  . KNEE SURGERY     bilateral  . KNEE SURGERY Left   . PERIPHERAL VASCULAR CATHETERIZATION N/A 08/30/2015   Procedure: Abdominal Aortogram w/Lower Extremity;  Surgeon: Renford Dills,  MD;  Location: [facility];  Service: Cardiovascular;  Laterality: N/A;  . PERIPHERAL VASCULAR CATHETERIZATION  08/30/2015   Procedure: Lower Extremity Intervention;  Surgeon: Renford Dills, MD;  Location: [facility];  Service: Cardiovascular;;  . PERIPHERAL VASCULAR CATHETERIZATION Left 11/23/2015   Procedure: Abdominal Aortogram w/Lower  Extremity;  Surgeon: Renford Dills, MD;  Location: [facility];  Service: Cardiovascular;  Laterality: Left;  . PERIPHERAL VASCULAR CATHETERIZATION  11/23/2015   Procedure: Lower Extremity Intervention;  Surgeon: Renford Dills, MD;  Location: [facility];  Service: Cardiovascular;;  . PERIPHERAL VASCULAR CATHETERIZATION Right 11/25/2015   Procedure: Lower Extremity Angiography;  Surgeon: Renford Dills, MD;  Location: [facility];  Service: Cardiovascular;  Laterality: Right;    No Known Allergies    Medication List       Accurate as of 01/02/16 11:59 PM. Always use your most recent med list.          amiodarone 100 MG tablet Commonly known as:  PACERONE Take 100 mg by mouth daily.   carbidopa-levodopa 25-100 MG tablet Commonly known as:  SINEMET IR Take 1 tablet by mouth 4 (four) times daily.   clopidogrel 75 MG tablet Commonly known as:  PLAVIX   feeding supplement (GLUCERNA SHAKE) Liqd Take 237 mLs by mouth 3 (three) times daily between meals.   furosemide 40 MG tablet Commonly known as:  LASIX   heparin flush 10 UNIT/ML Soln injection Inject 10 Units into the vein 4 (four) times daily.   levothyroxine 150 MCG tablet Commonly known as:  SYNTHROID, LEVOTHROID Take 150 mcg by mouth daily before breakfast.   misoprostol 100 MCG tablet Commonly known as:  CYTOTEC Take 100 mcg by mouth 4 (four) times daily.   oxyCODONE-acetaminophen 5-325 MG tablet Commonly known as:  PERCOCET/ROXICET Take 1 tablet by mouth every 4 (four) hours as needed for moderate pain.   pantoprazole 40 MG tablet Commonly known as:  PROTONIX Take 1 tablet (40 mg total) by mouth 2 (two) times daily.   potassium chloride 10 MEQ tablet Commonly known as:  K-DUR,KLOR-CON Take 10 mEq by mouth daily.   pramipexole 1 MG tablet Commonly known as:  MIRAPEX Take 1 mg by mouth 4 (four) times daily.   sitaGLIPtin 100 MG tablet Commonly known as:  JANUVIA Take 1  tablet (100 mg total) by mouth daily.   sodium chloride flush 0.9 % Soln injection Inject 10 mLs into the vein 4 (four) times daily.   sucralfate 1 g tablet Commonly known as:  CARAFATE Take 1 tablet (1 g total) by mouth 4 (four) times daily -  with meals and at bedtime.   tamsulosin 0.4 MG Caps capsule Commonly known as:  FLOMAX Take 1 capsule (0.4 mg total) by mouth daily after supper.       Review of Systems  Constitutional: Negative for activity change, chills, diaphoresis and fever.  HENT: Negative.   Eyes: Negative.   Respiratory: Negative for apnea, cough, choking, chest tightness, shortness of breath and wheezing.   Cardiovascular: Positive for leg swelling. Negative for chest pain and palpitations.  Gastrointestinal: Positive for constipation. Negative for abdominal distention, abdominal pain, diarrhea and nausea.  Genitourinary: Negative for difficulty urinating, dysuria, frequency and urgency.  Musculoskeletal: Positive for arthralgias (typical arthritis). Negative for back pain, gait problem and myalgias.  Skin: Negative for color change, pallor, rash and wound.  Neurological: Negative.   Hematological: Negative.   Psychiatric/Behavioral: Negative.  All other systems reviewed and are negative.   Immunization History  Administered Date(s) Administered  . Influenza-Unspecified 03/04/2014, 03/16/2015  . Pneumococcal-Unspecified 01/11/2002, 05/10/2010  . Td 06/18/2005  . Zoster 02/06/2007   Pertinent  Health Maintenance Due  Topic Date Due  . OPHTHALMOLOGY EXAM  01/12/1935  . PNA vac Low Risk Adult (2 of 2 - PCV13) 05/11/2011  . INFLUENZA VACCINE  01/03/2016  . HEMOGLOBIN A1C  05/23/2016  . FOOT EXAM  09/26/2016  . URINE MICROALBUMIN  09/26/2016   Fall Risk  09/27/2015  Falls in the past year? Exclusion - non ambulatory   Functional Status Survey:    Vitals:   01/02/16 1600  BP: (!) 150/71  Pulse: 75  Resp: (!) 28  Temp: (!) 96.9 F (36.1 C)  SpO2:  93%  Weight: 202 lb 12.8 oz (92 kg)   Body mass index is 27.5 kg/m. Physical Exam  Constitutional: He is oriented to person, place, and time. Vital signs are normal. He appears well-developed and well-nourished. He is active and cooperative. He does not appear ill. No distress.  HENT:  Head: Normocephalic and atraumatic.  Mouth/Throat: Uvula is midline, oropharynx is clear and moist and mucous membranes are normal. Mucous membranes are not pale, not dry and not cyanotic.  Eyes: Conjunctivae, EOM and lids are normal. Pupils are equal, round, and reactive to light.  Neck: Trachea normal, normal range of motion and full passive range of motion without pain. Neck supple. No JVD present. No tracheal deviation, no edema and no erythema present. No thyromegaly present.  Cardiovascular: Normal rate, regular rhythm, normal heart sounds, intact distal pulses and normal pulses.  Exam reveals no gallop and no distant heart sounds.   No murmur heard. Pulmonary/Chest: Effort normal and breath sounds normal. No accessory muscle usage. No respiratory distress. He has no wheezes. He has no rales. He exhibits no tenderness.  Abdominal: Soft. Normal appearance and bowel sounds are normal. He exhibits no distension and no ascites. There is no tenderness. There is no guarding.  Musculoskeletal: He exhibits edema (mild edema of BLE). He exhibits no tenderness.  Expected osteoarthritis, stiffness  Neurological: He is alert and oriented to person, place, and time. He has normal strength.  Skin: Skin is warm, dry and intact. No rash noted. He is not diaphoretic. No cyanosis or erythema. There is pallor. Nails show no clubbing.     Moisture Associated Skin Damage to the sacrum  Psychiatric: He has a normal mood and affect. His speech is normal and behavior is normal. Judgment and thought content normal. Cognition and memory are normal.  Nursing note and vitals reviewed.   Labs reviewed:  Recent Labs   11/23/15 0541 11/25/15 0500  12/08/15 0500  12/20/15 2040 12/21/15 2100 12/26/15 2040 12/28/15 0550 01/03/16 1100  NA 139 138  < > 141  < > 140  --  140  --  145  K 3.8 3.4*  < > 3.5  < > 3.8  --  3.1* 3.2* 2.9*  CL 103 108  < > 115*  < > 109  --  112*  --  115*  CO2 28 26  < > 23  < > 27  --  23  --  26  GLUCOSE 157* 118*  < > 85  < > 165*  --  120*  --  127*  BUN 20 17  < > 9  < > 11 9 7   --  <5*  CREATININE 1.18 1.03  < >  1.03  < > 1.43* 1.21 1.22  --  1.10  CALCIUM 8.7* 8.2*  < > 7.9*  < > 8.0*  --  7.5*  --  7.7*  MG 1.9 1.8  --  1.6*  --   --   --   --   --   --   < > = values in this interval not displayed.  Recent Labs  12/15/15 1323 12/19/15 1445 01/03/16 1100  AST 10* 20 12*  ALT <5* 6* <5*  ALKPHOS 91 80 74  BILITOT 1.0 0.5 0.4  PROT 4.8* 4.6* 4.4*  ALBUMIN 2.2* 2.0* 2.0*    Recent Labs  12/15/15 1323 12/19/15 1445 01/03/16 1100  WBC 6.1 6.5 6.3  NEUTROABS 4.1 4.2 4.7  HGB 10.5* 10.2* 10.2*  HCT 31.8* 30.5* 30.6*  MCV 91.3 90.9 88.3  PLT 193 202 268   Lab Results  Component Value Date   TSH 3.080 09/27/2015   Lab Results  Component Value Date   HGBA1C 7.5 (H) 11/22/2015   Lab Results  Component Value Date   CHOL 146 09/27/2015   HDL 23 (L) 09/27/2015   LDLCALC 73 09/27/2015   TRIG 252 (H) 09/27/2015    Significant Diagnostic Results in last 30 days:  Ct Abdomen Pelvis Wo Contrast  Result Date: 12/07/2015 CLINICAL DATA:  Hypotension and slurred speech with nausea and fusion. Recent diagnosis of esophageal tear. EXAM: CT ABDOMEN AND PELVIS WITHOUT CONTRAST TECHNIQUE: Multidetector CT imaging of the abdomen and pelvis was performed following the standard protocol without IV contrast. COMPARISON:  07/28/2009 FINDINGS: Lower chest: Right lower lobe interstitial and airspace disease raises suspicion of pneumonia. Small bilateral pleural effusions are evident. Hepatobiliary: No focal abnormality in the liver on this study without intravenous  contrast. No evidence of hepatomegaly. Multiple calcified stones are seen in the gallbladder. No intrahepatic or extrahepatic biliary dilation. Pancreas: Pancreas is diffusely fatty replaced. Multi a lobular cystic lesion in the tail the pancreas has progressed in the interval. Spleen: No splenomegaly. No focal mass lesion. Adrenals/Urinary Tract: No adrenal nodule or mass. Right kidney is atrophic with multiple low-density lesions compatible with cysts. Multiple cysts are noted in the left kidney with lower pole scarring. No hydroureteronephrosis. The urinary bladder appears normal for the degree of distention. Stomach/Bowel: Stomach is nondistended. No gastric wall thickening. No evidence of outlet obstruction. Duodenum is normally positioned as is the ligament of Treitz. No small bowel wall thickening. No small bowel dilatation. The terminal ileum is normal. Diverticular changes are noted in the left colon without evidence of diverticulitis. Vascular/Lymphatic: There is abdominal aortic atherosclerosis without aneurysm. There is no gastrohepatic or hepatoduodenal ligament lymphadenopathy. No intraperitoneal or retroperitoneal lymphadenopathy. No pelvic sidewall lymphadenopathy. Reproductive: Prostate gland appears enlarged with probable TURP defect. Other: No intraperitoneal free fluid. Musculoskeletal: Stable appearance of trabecular coarsening in the left hemipelvis, likely a reflection of Paget's disease. IMPRESSION: 1. Right lower lobe interstitial and airspace disease suggests pneumonia. 2. Tiny bilateral pleural effusions. 3. Right renal atrophy with incompletely characterized cystic lesions in the kidneys bilaterally. No hydronephrosis. 4. Multi lobular cystic lesion in the tail the pancreas measuring up to 4.7 x 1.6 cm in total. This has progressed slightly since 07/28/2009. Electronically Signed   By: Kennith Center M.D.   On: 12/07/2015 13:56   Dg Ankle 2 Views Left  Result Date: 12/07/2015 CLINICAL  DATA:  Evaluate for osteomyelitis.  Hypotension EXAM: LEFT ANKLE - 2 VIEW COMPARISON:  07/22/2015 FINDINGS: Wound VAC along the  lateral ankle. No soft tissue emphysema or signs of acute osteomyelitis. Negative for ankle joint effusion. Osteopenia and atherosclerosis. IMPRESSION: Negative for osteomyelitis or soft tissue emphysema. Electronically Signed   By: Marnee Spring M.D.   On: 12/07/2015 13:05   Dg Ankle 2 Views Right  Result Date: 12/07/2015 CLINICAL DATA:  Pain. EXAM: RIGHT ANKLE - 2 VIEW COMPARISON:  None. FINDINGS: Two-view exam shows no gross fracture or dislocation. Orientation of the lateral malleolus to the ankle mortise cannot be discerned on this two-view oblique study. No worrisome lytic or sclerotic osseous abnormality. IMPRESSION: Limited study without gross bony abnormality. Electronically Signed   By: Kennith Center M.D.   On: 12/07/2015 13:03   Dg Chest Portable 1 View  Result Date: 12/07/2015 CLINICAL DATA:  Sepsis.  Hypotension EXAM: PORTABLE CHEST 1 VIEW COMPARISON:  11/30/2015 FINDINGS: Right upper extremity PICC with tip at the upper right atrium. Low volume chest with chronic elevation of the right diaphragm. Curvilinear lucency at the right base favors atelectasis, pneumoperitoneum not excluded. There is no edema, consolidation, effusion, or pneumothorax. These results were called by telephone at the time of interpretation on 12/07/2015 at 12:59 pm to Dr. Nita Sickle , who verbally acknowledged these results. IMPRESSION: 1. Right basilar lucency could be from atelectasis or pneumoperitoneum. If abdominal pain, recommended CT. 2. No evidence of acute cardiopulmonary disease. Electronically Signed   By: Marnee Spring M.D.   On: 12/07/2015 13:00   Dg Foot 2 Views Left  Result Date: 12/07/2015 CLINICAL DATA:  Hypotension.  Evaluate for osteomyelitis. EXAM: LEFT FOOT - 2 VIEW COMPARISON:  11/04/2015 FINDINGS: Interval transmetatarsal amputation to the first ray. Remote distal  fifth metatarsal resection. No generalized soft tissue emphysema. Unremarkable appearance of the osteotomies. Osteopenia and atherosclerosis. IMPRESSION: Unremarkable appearance of interval first ray transmetatarsal amputation. No indication of acute osteomyelitis. Electronically Signed   By: Marnee Spring M.D.   On: 12/07/2015 13:04   Dg Foot 2 Views Right  Result Date: 12/07/2015 CLINICAL DATA:  Pain. EXAM: RIGHT FOOT - 2 VIEW COMPARISON:  None. FINDINGS: Limited two views study with superimposition of bony anatomy. No gross fracture evident. No evidence for frank dislocation. Bones are demineralized. No worrisome lytic or sclerotic osseous abnormality evident. IMPRESSION: Negative. Electronically Signed   By: Kennith Center M.D.   On: 12/07/2015 13:04    Assessment/Plan 1. Alteration in skin integrity due to moisture  Triad wound cream to sacrum BID and prn. If soiled, wipe off top layer, then reapply. Turn Q 2 hours. Maintain increased calories/ nutrition needs.   2. Chronic diastolic heart failure (HCC)  Decrease rate if IVF infusion to 30 mL/ hr. Elevate legs when at rest  Family/ staff Communication:   Total Time: 45 minutes  Documentation: 15 minutes  Face to Face: 30 minutes  Family/Phone:   Labs/tests ordered:  Cbc, metc, sed rate, vanc trough   Medication list reviewed and assessed for continued appropriateness.  Brynda Rim, NP-C Geriatrics [facility] [phone] [address], [place] [address] Cell Phone (Mon-Fri 8am-5pm):  [phone] On Call:  [phone] & follow prompts after 5pm & weekends Office Phone:  [phone] Office Fax:  [phone]

## 2016-01-04 DIAGNOSIS — E876 Hypokalemia: Secondary | ICD-10-CM | POA: Diagnosis not present

## 2016-01-04 LAB — GLUCOSE, CAPILLARY: Glucose-Capillary: 113 mg/dL — ABNORMAL HIGH (ref 65–99)

## 2016-01-05 ENCOUNTER — Non-Acute Institutional Stay (SKILLED_NURSING_FACILITY): Payer: PPO | Admitting: Gerontology

## 2016-01-05 DIAGNOSIS — F05 Delirium due to known physiological condition: Secondary | ICD-10-CM

## 2016-01-05 DIAGNOSIS — E876 Hypokalemia: Secondary | ICD-10-CM | POA: Diagnosis not present

## 2016-01-05 LAB — GLUCOSE, CAPILLARY: Glucose-Capillary: 95 mg/dL (ref 65–99)

## 2016-01-06 ENCOUNTER — Non-Acute Institutional Stay (SKILLED_NURSING_FACILITY): Payer: PPO | Admitting: Gerontology

## 2016-01-06 DIAGNOSIS — I2510 Atherosclerotic heart disease of native coronary artery without angina pectoris: Secondary | ICD-10-CM | POA: Diagnosis not present

## 2016-01-06 DIAGNOSIS — M899 Disorder of bone, unspecified: Secondary | ICD-10-CM

## 2016-01-06 DIAGNOSIS — F05 Delirium due to known physiological condition: Secondary | ICD-10-CM | POA: Diagnosis not present

## 2016-01-06 DIAGNOSIS — I350 Nonrheumatic aortic (valve) stenosis: Secondary | ICD-10-CM | POA: Diagnosis not present

## 2016-01-06 DIAGNOSIS — E876 Hypokalemia: Secondary | ICD-10-CM | POA: Diagnosis not present

## 2016-01-06 LAB — GLUCOSE, CAPILLARY: Glucose-Capillary: 103 mg/dL — ABNORMAL HIGH (ref 65–99)

## 2016-01-06 NOTE — Progress Notes (Signed)
Location:      Place of Service:  SNF (31) Provider:  Lorenso Quarry, NP-C  Tonette Lederer, PA-C  Patient Care Team: Tonette Lederer, PA-C as PCP - General (Physician Assistant) Renford Dills, MD (Vascular Surgery) Antonieta Iba, MD as Consulting Physician (Cardiology) Linus Salmons, MD (Unknown Physician Specialty) Morene Crocker, MD as Referring Physician (Neurology) Evon Slack, PA-C as Physician Assistant (Orthopedic Surgery) Antonieta Iba, MD as Consulting Physician (Cardiology)  Extended Emergency Contact Information Primary Emergency Contact: Nanda [place] of [place] Home Phone: [phone] Relation: Daughter Secondary Emergency Contact: Bronson [place] of [place] Home Phone: [phone] Mobile Phone: [phone] Relation: Son  Code Status:  DNR Goals of care: Advanced Directive information Advanced Directives 12/07/2015  Does patient have an advance directive? Yes  Type of Estate agent of [place];Living will;Out of facility DNR (pink MOST or yellow form)  Does patient want to make changes to advanced directive? -  Copy of advanced directive(s) in chart? -  Pre-existing out of facility DNR order (yellow form or pink MOST form) -     Chief Complaint  Patient presents with  . Acute Visit    HPI:  Pt is a 82 y.o. male seen today for an acute visit for Acute confusional state. I was called urgently to the room by nursing when pt was very slow to respond to stimuli, slurred speech and disoriented. Within a few minutes, pt had returned to baseline, was alert and oriented x 3, clear speech. Spoke with son about events of the morning. Son verbalized he has noticed a significant decline since the careplan meeting on Monday. He later told me after the meeting, his dad said, "I'm done." Son is realistic about situation. He requested I call and speak with his sister, who is the HCPOA.     Past  Medical History:  Diagnosis Date  . Aortic stenosis   . Coronary artery disease   . Diabetes mellitus without complication (HCC)   . Hyperlipidemia   . Hypertension   . Hypothyroidism   . Thyroid disease   . TIA (transient ischemic attack)    Past Surgical History:  Procedure Laterality Date  . AMPUTATION TOE Left 11/05/2015   Procedure: AMPUTATION TOE;  Surgeon: Recardo Evangelist, DPM;  Location: [facility];  Service: Podiatry;  Laterality: Left;  . APPENDECTOMY    . APPLICATION OF WOUND VAC Left 11/05/2015   Procedure: APPLICATION OF WOUND VAC;  Surgeon: Recardo Evangelist, DPM;  Location: [facility];  Service: Podiatry;  Laterality: Left;  . CARDIAC CATHETERIZATION  2010   showed a patient circumflex stent with noncritical CAD and normal right-sided pressures.  . COLONOSCOPY    . CORONARY ANGIOPLASTY  01/24/2007   stent placement to the mid circumflex   . esophageal tear    . ESOPHAGOGASTRODUODENOSCOPY N/A 11/30/2015   Procedure: ESOPHAGOGASTRODUODENOSCOPY (EGD);  Surgeon: Charlott Rakes, MD;  Location: [facility];  Service: Endoscopy;  Laterality: N/A;  . EYE SURGERY    . HERNIA REPAIR    . IRRIGATION AND DEBRIDEMENT FOOT Left 11/05/2015   Procedure: IRRIGATION AND DEBRIDEMENT FOOT / HEEL ULCER;  Surgeon: Recardo Evangelist, DPM;  Location: [facility];  Service: Podiatry;  Laterality: Left;  . IRRIGATION AND DEBRIDEMENT FOOT Left 11/24/2015   Procedure: IRRIGATION AND DEBRIDEMENT FOOT;  Surgeon: Recardo Evangelist, DPM;  Location: [facility];  Service: Podiatry;  Laterality: Left;  . KNEE SURGERY     bilateral  . KNEE SURGERY Left   .  PERIPHERAL VASCULAR CATHETERIZATION N/A 08/30/2015   Procedure: Abdominal Aortogram w/Lower Extremity;  Surgeon: Renford Dills, MD;  Location: [facility];  Service: Cardiovascular;  Laterality: N/A;  . PERIPHERAL VASCULAR CATHETERIZATION  08/30/2015   Procedure: Lower Extremity Intervention;  Surgeon: Renford Dills, MD;  Location: [facility];   Service: Cardiovascular;;  . PERIPHERAL VASCULAR CATHETERIZATION Left 11/23/2015   Procedure: Abdominal Aortogram w/Lower Extremity;  Surgeon: Renford Dills, MD;  Location: [facility];  Service: Cardiovascular;  Laterality: Left;  . PERIPHERAL VASCULAR CATHETERIZATION  11/23/2015   Procedure: Lower Extremity Intervention;  Surgeon: Renford Dills, MD;  Location: [facility];  Service: Cardiovascular;;  . PERIPHERAL VASCULAR CATHETERIZATION Right 11/25/2015   Procedure: Lower Extremity Angiography;  Surgeon: Renford Dills, MD;  Location: [facility];  Service: Cardiovascular;  Laterality: Right;    No Known Allergies    Medication List       Accurate as of 01/05/16 11:59 PM. Always use your most recent med list.          amiodarone 100 MG tablet Commonly known as:  PACERONE Take 100 mg by mouth daily.   carbidopa-levodopa 25-100 MG tablet Commonly known as:  SINEMET IR Take 1 tablet by mouth 4 (four) times daily.   clopidogrel 75 MG tablet Commonly known as:  PLAVIX   colchicine 0.6 MG tablet Take 0.6 mg by mouth daily.   feeding supplement (GLUCERNA SHAKE) Liqd Take 237 mLs by mouth 3 (three) times daily between meals.   furosemide 40 MG tablet Commonly known as:  LASIX   heparin flush 10 UNIT/ML Soln injection Inject 10 Units into the vein 4 (four) times daily.   levothyroxine 150 MCG tablet Commonly known as:  SYNTHROID, LEVOTHROID Take 150 mcg by mouth daily before breakfast.   misoprostol 100 MCG tablet Commonly known as:  CYTOTEC Take 100 mcg by mouth 4 (four) times daily.   oxyCODONE-acetaminophen 5-325 MG tablet Commonly known as:  PERCOCET/ROXICET Take 1 tablet by mouth every 4 (four) hours as needed for moderate pain.   pantoprazole 40 MG tablet Commonly known as:  PROTONIX Take 1 tablet (40 mg total) by mouth 2 (two) times daily.   potassium chloride 10 MEQ tablet Commonly known as:  K-DUR,KLOR-CON Take 10 mEq  by mouth daily.   pramipexole 1 MG tablet Commonly known as:  MIRAPEX Take 1 mg by mouth 4 (four) times daily.   sitaGLIPtin 100 MG tablet Commonly known as:  JANUVIA Take 1 tablet (100 mg total) by mouth daily.   sodium chloride flush 0.9 % Soln injection Inject 10 mLs into the vein 4 (four) times daily.   sucralfate 1 g tablet Commonly known as:  CARAFATE Take 1 tablet (1 g total) by mouth 4 (four) times daily -  with meals and at bedtime.   tamsulosin 0.4 MG Caps capsule Commonly known as:  FLOMAX Take 1 capsule (0.4 mg total) by mouth daily after supper.       Review of Systems  Constitutional: Positive for activity change, appetite change and diaphoresis. Negative for chills and fever.  HENT: Negative for congestion, sneezing, sore throat, trouble swallowing and voice change.   Eyes: Negative for pain, redness and visual disturbance.  Respiratory: Positive for choking (Informed me he "got strangled" on water earlier). Negative for apnea, cough, chest tightness, shortness of breath and wheezing.   Cardiovascular: Negative for chest pain, palpitations and leg swelling.  Gastrointestinal: Positive for  nausea. Negative for abdominal distention, abdominal pain, blood in stool, constipation and diarrhea.  Genitourinary: Negative for difficulty urinating, dysuria, frequency and urgency.  Musculoskeletal: Positive for arthralgias (typical arthritis). Negative for back pain, gait problem and myalgias.  Skin: Negative for color change, pallor, rash and wound.  Neurological: Negative for dizziness, tremors, syncope, facial asymmetry, speech difficulty, weakness, light-headedness, numbness and headaches.  Psychiatric/Behavioral: Negative for agitation and behavioral problems.  All other systems reviewed and are negative.   Immunization History  Administered Date(s) Administered  . Influenza-Unspecified 03/04/2014, 03/16/2015  . Pneumococcal-Unspecified 01/11/2002, 05/10/2010  . Td  06/18/2005  . Zoster 02/06/2007   Pertinent  Health Maintenance Due  Topic Date Due  . OPHTHALMOLOGY EXAM  01/12/1935  . PNA vac Low Risk Adult (2 of 2 - PCV13) 05/11/2011  . INFLUENZA VACCINE  01/03/2016  . HEMOGLOBIN A1C  05/23/2016  . FOOT EXAM  09/26/2016  . URINE MICROALBUMIN  09/26/2016   Fall Risk  09/27/2015  Falls in the past year? Exclusion - non ambulatory   Functional Status Survey:    Vitals:   01/05/16 0500  BP: (!) 148/73  Pulse: 82  Resp: (!) 24  Temp: 97.4 F (36.3 C)  SpO2: 95%  Weight: 191 lb 12.8 oz (87 kg)   Body mass index is 26.01 kg/m. Physical Exam  Constitutional: He is oriented to person, place, and time. Vital signs are normal. He appears well-developed and well-nourished. He is active and cooperative. He does not appear ill. No distress.  HENT:  Head: Normocephalic and atraumatic.  Mouth/Throat: Uvula is midline, oropharynx is clear and moist and mucous membranes are normal. Mucous membranes are not pale, not dry and not cyanotic.  Eyes: Conjunctivae, EOM and lids are normal. Pupils are equal, round, and reactive to light.  Neck: Trachea normal, normal range of motion and full passive range of motion without pain. Neck supple. No JVD present. No tracheal deviation, no edema and no erythema present. No thyromegaly present.  Cardiovascular: Normal rate, regular rhythm, intact distal pulses and normal pulses.  Exam reveals no gallop, no distant heart sounds and no friction rub.   Murmur heard. Pulmonary/Chest: Effort normal. No accessory muscle usage. No respiratory distress. He has decreased breath sounds in the right lower field. He has no wheezes. He has no rhonchi. He has no rales. He exhibits no tenderness.  Abdominal: Normal appearance and bowel sounds are normal. He exhibits no distension and no ascites. There is no tenderness.  Musculoskeletal: Normal range of motion. He exhibits no edema or tenderness.  Expected osteoarthritis, stiffness    Neurological: He is alert and oriented to person, place, and time. He has normal strength.  Skin: Skin is warm, dry and intact. He is not diaphoretic. No cyanosis. No pallor. Nails show no clubbing.  Psychiatric: He has a normal mood and affect. His speech is normal and behavior is normal. Judgment and thought content normal. Cognition and memory are normal.  Nursing note and vitals reviewed.   Labs reviewed:  Recent Labs  11/23/15 0541 11/25/15 0500  12/08/15 0500  12/26/15 2040 12/28/15 0550 01/03/16 1100 01/03/16 2120  NA 139 138  < > 141  < > 140  --  145 144  K 3.8 3.4*  < > 3.5  < > 3.1* 3.2* 2.9* 3.1*  CL 103 108  < > 115*  < > 112*  --  115* 116*  CO2 28 26  < > 23  < > 23  --  26 24  GLUCOSE 157* 118*  < > 85  < > 120*  --  127* 122*  BUN 20 17  < > 9  < > 7  --  <5* 5*  CREATININE 1.18 1.03  < > 1.03  < > 1.22  --  1.10 1.00  CALCIUM 8.7* 8.2*  < > 7.9*  < > 7.5*  --  7.7* 7.5*  MG 1.9 1.8  --  1.6*  --   --   --   --   --   < > = values in this interval not displayed.  Recent Labs  12/15/15 1323 12/19/15 1445 01/03/16 1100  AST 10* 20 12*  ALT <5* 6* <5*  ALKPHOS 91 80 74  BILITOT 1.0 0.5 0.4  PROT 4.8* 4.6* 4.4*  ALBUMIN 2.2* 2.0* 2.0*    Recent Labs  12/15/15 1323 12/19/15 1445 01/03/16 1100  WBC 6.1 6.5 6.3  NEUTROABS 4.1 4.2 4.7  HGB 10.5* 10.2* 10.2*  HCT 31.8* 30.5* 30.6*  MCV 91.3 90.9 88.3  PLT 193 202 268   Lab Results  Component Value Date   TSH 3.080 09/27/2015   Lab Results  Component Value Date   HGBA1C 7.5 (H) 11/22/2015   Lab Results  Component Value Date   CHOL 146 09/27/2015   HDL 23 (L) 09/27/2015   LDLCALC 73 09/27/2015   TRIG 252 (H) 09/27/2015    Significant Diagnostic Results in last 30 days:  No results found.  Assessment/Plan 1. Acute confusional state  Continue current regimen  Palliative Care Consult Re: declining health    Family/ staff Communication:   Total Time: 65 minutes  Documentation:  15 minutes  Face to Face: 20 minutes  Family/Phone: 30 minutes- spoke with son in the hallway, called daughter on phone   Labs/tests ordered:  none  Medication list reviewed and assessed for continued appropriateness.  Brynda Rim, NP-C Geriatrics [facility] [phone] [address], [place] [address] Cell Phone (Mon-Fri 8am-5pm):  [phone] On Call:  [phone] & follow prompts after 5pm & weekends Office Phone:  [phone] Office Fax:  [phone]

## 2016-01-07 ENCOUNTER — Encounter: Payer: Self-pay | Admitting: Gerontology

## 2016-01-07 NOTE — Progress Notes (Signed)
Location:      Place of Service:  SNF (31) Provider:  Shannon Leach, NP-C  KIMBERLY A STEGMAYER, PA-C  Patient Care Team: Kimberly A Stegmayer, PA-C as PCP - General (Physician Assistant) Gregory G Schnier, MD (Vascular Surgery) Timothy J Gollan, MD as Consulting Physician (Cardiology) Chapman McQueen, MD (Unknown Physician Specialty) Zachary E Potter, MD as Referring Physician (Neurology) Thomas C Gaines, PA-C as Physician Assistant (Orthopedic Surgery) Timothy J Gollan, MD as Consulting Physician (Cardiology)  Extended Emergency Contact Information Primary Emergency Contact: Day,Ann  [place] Home Phone: [phone] Relation: Daughter Secondary Emergency Contact: Pavlich,David  [place] Home Phone: [phone] Mobile Phone: [phone] Relation: Son  Code Status:  DNR Goals of care: Advanced Directive information Advanced Directives 12/07/2015  Does patient have an advance directive? Yes  Type of Advance Directive Healthcare Power of Attorney;Living will;Out of facility DNR (pink MOST or yellow form)  Does patient want to make changes to advanced directive? -  Copy of advanced directive(s) in chart? -  Pre-existing out of facility DNR order (yellow form or pink MOST form) -     Chief Complaint  Patient presents with  . Acute Visit    HPI:  Pt is a 82 y.o. male seen today for an acute visit for Altered Mental Status. Again called urgently to the room by nursing and physical therapy. PT reports she went into the room to speak with pt, pt did not remember therapist that had been in his room twice already today. Falling asleep in mid-sentence. Over all just "doesn't look good." per PT. When assessed, pt was lethargic, confused, slurred speech, falling sleep in mid-sentence, unable to complete thoughts. Says he feels better than yesterday, but unable to elaborate. When not stimulated, pt falls asleep quickly. While asleep, respirations were  32/minute, having short periods of apnea. Color is pasty. Notified daughter and son of change in status. Family confirmed observations. We spoke candidly about observations and prognosis. Ultimately, family decided on Hospice services for comfort care. Pt was opened to Hospice services this evening.    Past Medical History:  Diagnosis Date  . Aortic stenosis   . Coronary artery disease   . Diabetes mellitus without complication (HCC)   . Hyperlipidemia   . Hypertension   . Hypothyroidism   . Thyroid disease   . TIA (transient ischemic attack)    Past Surgical History:  Procedure Laterality Date  . AMPUTATION TOE Left 11/05/2015   Procedure: AMPUTATION TOE;  Surgeon: Matthew Troxler, DPM;  Location: [facility];  Service: Podiatry;  Laterality: Left;  . APPENDECTOMY    . APPLICATION OF WOUND VAC Left 11/05/2015   Procedure: APPLICATION OF WOUND VAC;  Surgeon: Matthew Troxler, DPM;  Location: [facility];  Service: Podiatry;  Laterality: Left;  . CARDIAC CATHETERIZATION  2010   showed a patient circumflex stent with noncritical CAD and normal right-sided pressures.  . COLONOSCOPY    . CORONARY ANGIOPLASTY  01/24/2007   stent placement to the mid circumflex   . esophageal tear    . ESOPHAGOGASTRODUODENOSCOPY N/A 11/30/2015   Procedure: ESOPHAGOGASTRODUODENOSCOPY (EGD);  Surgeon: Vincent Schooler, MD;  Location: [facility];  Service: Endoscopy;  Laterality: N/A;  . EYE SURGERY    . HERNIA REPAIR    . IRRIGATION AND DEBRIDEMENT FOOT Left 11/05/2015   Procedure: IRRIGATION AND DEBRIDEMENT FOOT / HEEL ULCER;  Surgeon: Matthew Troxler, DPM;  Location: [facility];  Service: Podiatry;  Laterality: Left;  . IRRIGATION AND DEBRIDEMENT FOOT Left   11/24/2015   Procedure: IRRIGATION AND DEBRIDEMENT FOOT;  Surgeon: Matthew Troxler, DPM;  Location: [facility];  Service: Podiatry;  Laterality: Left;  . KNEE SURGERY     bilateral  . KNEE SURGERY Left   . PERIPHERAL VASCULAR CATHETERIZATION N/A 08/30/2015    Procedure: Abdominal Aortogram w/Lower Extremity;  Surgeon: Gregory G Schnier, MD;  Location: [facility];  Service: Cardiovascular;  Laterality: N/A;  . PERIPHERAL VASCULAR CATHETERIZATION  08/30/2015   Procedure: Lower Extremity Intervention;  Surgeon: Gregory G Schnier, MD;  Location: [facility];  Service: Cardiovascular;;  . PERIPHERAL VASCULAR CATHETERIZATION Left 11/23/2015   Procedure: Abdominal Aortogram w/Lower Extremity;  Surgeon: Gregory G Schnier, MD;  Location: [facility];  Service: Cardiovascular;  Laterality: Left;  . PERIPHERAL VASCULAR CATHETERIZATION  11/23/2015   Procedure: Lower Extremity Intervention;  Surgeon: Gregory G Schnier, MD;  Location: [facility];  Service: Cardiovascular;;  . PERIPHERAL VASCULAR CATHETERIZATION Right 11/25/2015   Procedure: Lower Extremity Angiography;  Surgeon: Gregory G Schnier, MD;  Location: [facility];  Service: Cardiovascular;  Laterality: Right;    No Known Allergies    Medication List       Accurate as of 01/06/16 11:59 PM. Always use your most recent med list.          amiodarone 100 MG tablet Commonly known as:  PACERONE Take 100 mg by mouth daily.   carbidopa-levodopa 25-100 MG tablet Commonly known as:  SINEMET IR Take 1 tablet by mouth 4 (four) times daily.   clopidogrel 75 MG tablet Commonly known as:  PLAVIX   feeding supplement (GLUCERNA SHAKE) Liqd Take 237 mLs by mouth 3 (three) times daily between meals.   furosemide 40 MG tablet Commonly known as:  LASIX   heparin flush 10 UNIT/ML Soln injection Inject 10 Units into the vein 4 (four) times daily.   levothyroxine 150 MCG tablet Commonly known as:  SYNTHROID, LEVOTHROID Take 150 mcg by mouth daily before breakfast.   misoprostol 100 MCG tablet Commonly known as:  CYTOTEC Take 100 mcg by mouth 4 (four) times daily.   oxyCODONE-acetaminophen 5-325 MG tablet Commonly known as:  PERCOCET/ROXICET Take 1 tablet by  mouth every 4 (four) hours as needed for moderate pain.   pantoprazole 40 MG tablet Commonly known as:  PROTONIX Take 1 tablet (40 mg total) by mouth 2 (two) times daily.   potassium chloride 10 MEQ tablet Commonly known as:  K-DUR,KLOR-CON Take 10 mEq by mouth daily.   pramipexole 1 MG tablet Commonly known as:  MIRAPEX Take 1 mg by mouth 4 (four) times daily.   sitaGLIPtin 100 MG tablet Commonly known as:  JANUVIA Take 1 tablet (100 mg total) by mouth daily.   sodium chloride flush 0.9 % Soln injection Inject 10 mLs into the vein 4 (four) times daily.   sucralfate 1 g tablet Commonly known as:  CARAFATE Take 1 tablet (1 g total) by mouth 4 (four) times daily -  with meals and at bedtime.   tamsulosin 0.4 MG Caps capsule Commonly known as:  FLOMAX Take 1 capsule (0.4 mg total) by mouth daily after supper.       Review of Systems  Unable to perform ROS: Acuity of condition  Constitutional: Positive for activity change, appetite change and fatigue. Negative for chills, diaphoresis and fever.  HENT: Negative for congestion, sneezing, sore throat, trouble swallowing and voice change.   Eyes: Negative for pain, redness and visual disturbance.  Respiratory:   Positive for apnea. Negative for cough, choking, chest tightness, shortness of breath and wheezing.   Cardiovascular: Negative for chest pain, palpitations and leg swelling.  Gastrointestinal: Negative for abdominal distention, abdominal pain, constipation, diarrhea and nausea.  Genitourinary: Negative for difficulty urinating, dysuria, frequency and urgency.  Musculoskeletal: Negative for back pain, gait problem and myalgias. Arthralgias: typical arthritis.  Skin: Positive for color change and pallor. Negative for rash and wound.  Neurological: Negative for dizziness, tremors, syncope, speech difficulty, weakness, numbness and headaches.  Psychiatric/Behavioral: Negative for agitation and behavioral problems.  All other  systems reviewed and are negative.   Immunization History  Administered Date(s) Administered  . Influenza-Unspecified 03/04/2014, 03/16/2015  . Pneumococcal-Unspecified 01/11/2002, 05/10/2010  . Td 06/18/2005  . Zoster 02/06/2007   Pertinent  Health Maintenance Due  Topic Date Due  . OPHTHALMOLOGY EXAM  01/12/1935  . PNA vac Low Risk Adult (2 of 2 - PCV13) 05/11/2011  . INFLUENZA VACCINE  01/03/2016  . HEMOGLOBIN A1C  05/23/2016  . FOOT EXAM  09/26/2016  . URINE MICROALBUMIN  09/26/2016   Fall Risk  09/27/2015  Falls in the past year? Exclusion - non ambulatory   Functional Status Survey:    Vitals:   01/07/16 0018  BP: 138/65  Pulse: (!) 52  Resp: 16  Temp: 97.5 F (36.4 C)  SpO2: 91%   There is no height or weight on file to calculate BMI. Physical Exam  Constitutional: Vital signs are normal. He appears well-developed and well-nourished. He appears lethargic. He is active and cooperative. He does not appear ill. No distress.  HENT:  Head: Normocephalic and atraumatic.  Mouth/Throat: Uvula is midline, oropharynx is clear and moist and mucous membranes are normal. Mucous membranes are not pale, not dry and not cyanotic.  Eyes: Conjunctivae, EOM and lids are normal. Pupils are equal, round, and reactive to light.  Neck: Trachea normal, normal range of motion and full passive range of motion without pain. Neck supple. No JVD present. No tracheal deviation, no edema and no erythema present. No thyromegaly present.  Cardiovascular: Intact distal pulses and normal pulses.  An irregular rhythm present. Bradycardia present.  Exam reveals distant heart sounds. Exam reveals no gallop and no friction rub.   Murmur heard. Pulmonary/Chest: Effort normal and breath sounds normal. No accessory muscle usage. Apnea (intermittent) and tachypnea (32 breaths per minute) noted. No respiratory distress. He has no decreased breath sounds. He has no wheezes. He has no rhonchi. He has no rales.  He exhibits no tenderness.  Abdominal: Normal appearance and bowel sounds are normal. He exhibits no distension and no ascites. There is no tenderness.  Musculoskeletal: Normal range of motion. He exhibits no edema or tenderness.  Expected osteoarthritis, stiffness  Neurological: He has normal strength. He appears lethargic. He is disoriented.  Skin: Skin is warm, dry and intact. He is not diaphoretic. No cyanosis. There is pallor (gray, pasty appearance). Nails show no clubbing.  Psychiatric: He has a normal mood and affect. His behavior is normal. Judgment and thought content normal. His speech is delayed and slurred. Cognition and memory are normal.  Nursing note and vitals reviewed.   Labs reviewed:  Recent Labs  11/23/15 0541 11/25/15 0500  12/08/15 0500  12/26/15 2040 12/28/15 0550 01/03/16 1100 01/03/16 2120  NA 139 138  < > 141  < > 140  --  145 144  K 3.8 3.4*  < > 3.5  < > 3.1* 3.2* 2.9* 3.1*  CL 103 108  < >   115*  < > 112*  --  115* 116*  CO2 28 26  < > 23  < > 23  --  26 24  GLUCOSE 157* 118*  < > 85  < > 120*  --  127* 122*  BUN 20 17  < > 9  < > 7  --  <5* 5*  CREATININE 1.18 1.03  < > 1.03  < > 1.22  --  1.10 1.00  CALCIUM 8.7* 8.2*  < > 7.9*  < > 7.5*  --  7.7* 7.5*  MG 1.9 1.8  --  1.6*  --   --   --   --   --   < > = values in this interval not displayed.  Recent Labs  12/15/15 1323 12/19/15 1445 01/03/16 1100  AST 10* 20 12*  ALT <5* 6* <5*  ALKPHOS 91 80 74  BILITOT 1.0 0.5 0.4  PROT 4.8* 4.6* 4.4*  ALBUMIN 2.2* 2.0* 2.0*    Recent Labs  12/15/15 1323 12/19/15 1445 01/03/16 1100  WBC 6.1 6.5 6.3  NEUTROABS 4.1 4.2 4.7  HGB 10.5* 10.2* 10.2*  HCT 31.8* 30.5* 30.6*  MCV 91.3 90.9 88.3  PLT 193 202 268   Lab Results  Component Value Date   TSH 3.080 09/27/2015   Lab Results  Component Value Date   HGBA1C 7.5 (H) 11/22/2015   Lab Results  Component Value Date   CHOL 146 09/27/2015   HDL 23 (L) 09/27/2015   LDLCALC 73 09/27/2015    TRIG 252 (H) 09/27/2015    Significant Diagnostic Results in last 30 days:  No results found.  Assessment/Plan 1. Acute confusional state  2. Aortic valve stenosis  3. CAD in native artery  4. Bone disease  5. Atherosclerosis of native coronary artery of native heart without angina pectoris   Hospice referral for EOL care  Morphine 20 mg/ ml- 0.25-0.5 mL po/sl Q1 hour prn pain, dyspnea  Lorazepam 0.5 mg po Q 4 hours prn anxiety, agitation, restlessness  D/C IV antibiotics  D/C non-essential medications  DNR  Family/ staff Communication:   Total Time: 90 minutes  Documentation: 15 minutes  Face to Face: 30 minutes  Family/Phone: 45 minutes- met with son, daughter, communicated with Hospice Rn and admissions   Labs/tests ordered:  none  Medication list reviewed and assessed for continued appropriateness.  Shannon H. Leach, NP-C Geriatrics [facility] [address] [place],  [address] Cell Phone (Mon-Fri 8am-5pm):  [phone] On Call:  [phone] & follow prompts after 5pm & weekends Office Phone:  [phone] Office Fax:  [phone]  

## 2016-01-07 NOTE — Progress Notes (Signed)
This encounter was created in error - please disregard.

## 2016-02-03 DEATH — deceased

## 2016-02-23 ENCOUNTER — Encounter: Payer: Self-pay | Admitting: *Deleted

## 2017-06-09 IMAGING — DX DG FOOT 2V*L*
2 series · 2 of 2 positions shown · non-contrast
Comparison: 11/04/2015

CLINICAL DATA: Hypotension.  Evaluate for osteomyelitis.

EXAM:
LEFT FOOT - 2 VIEW

[foot ap]
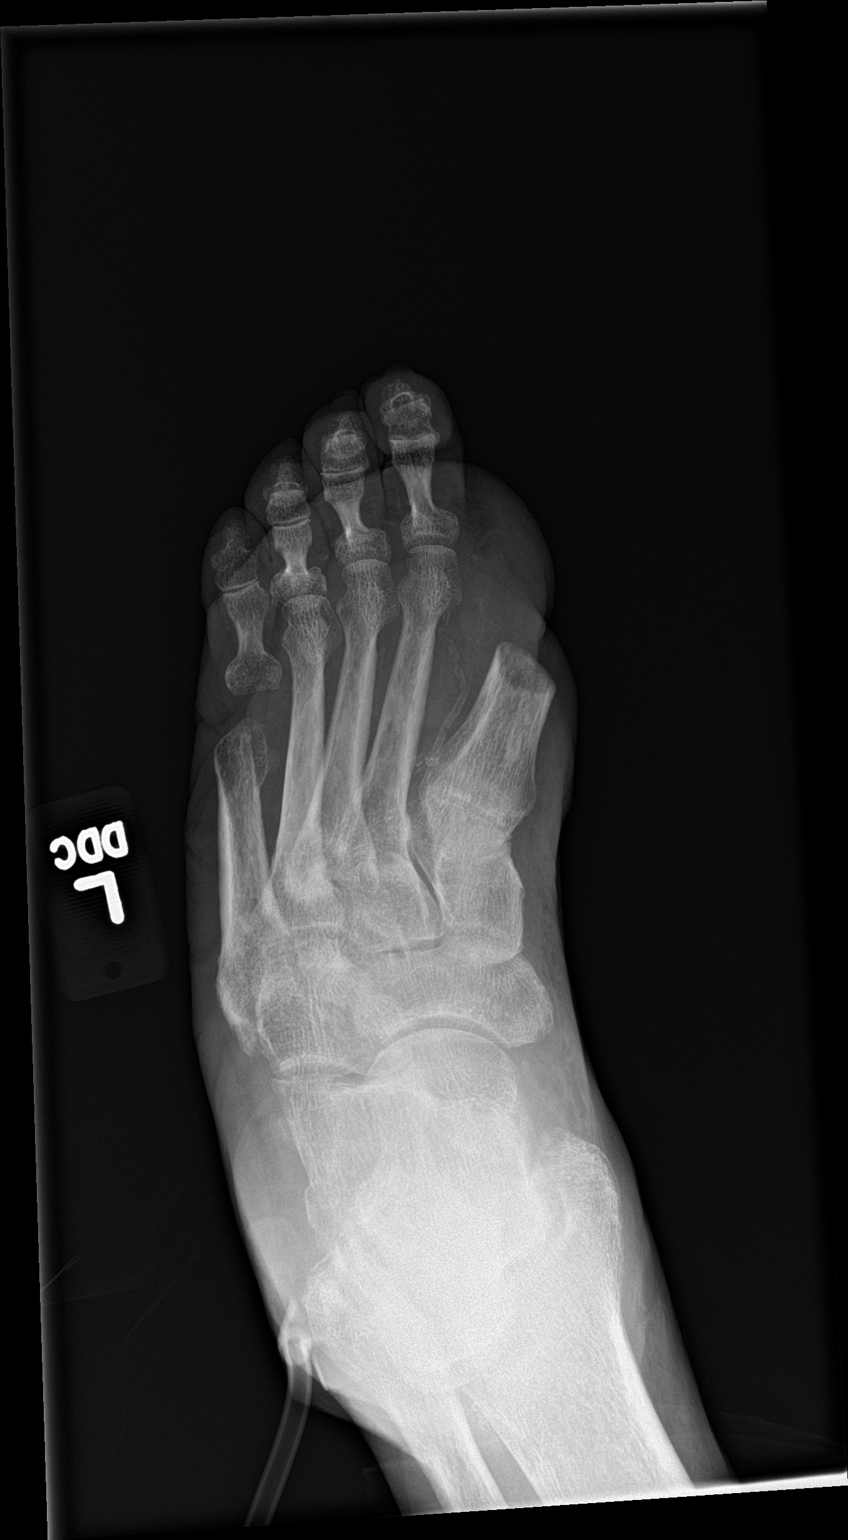

[foot lat]
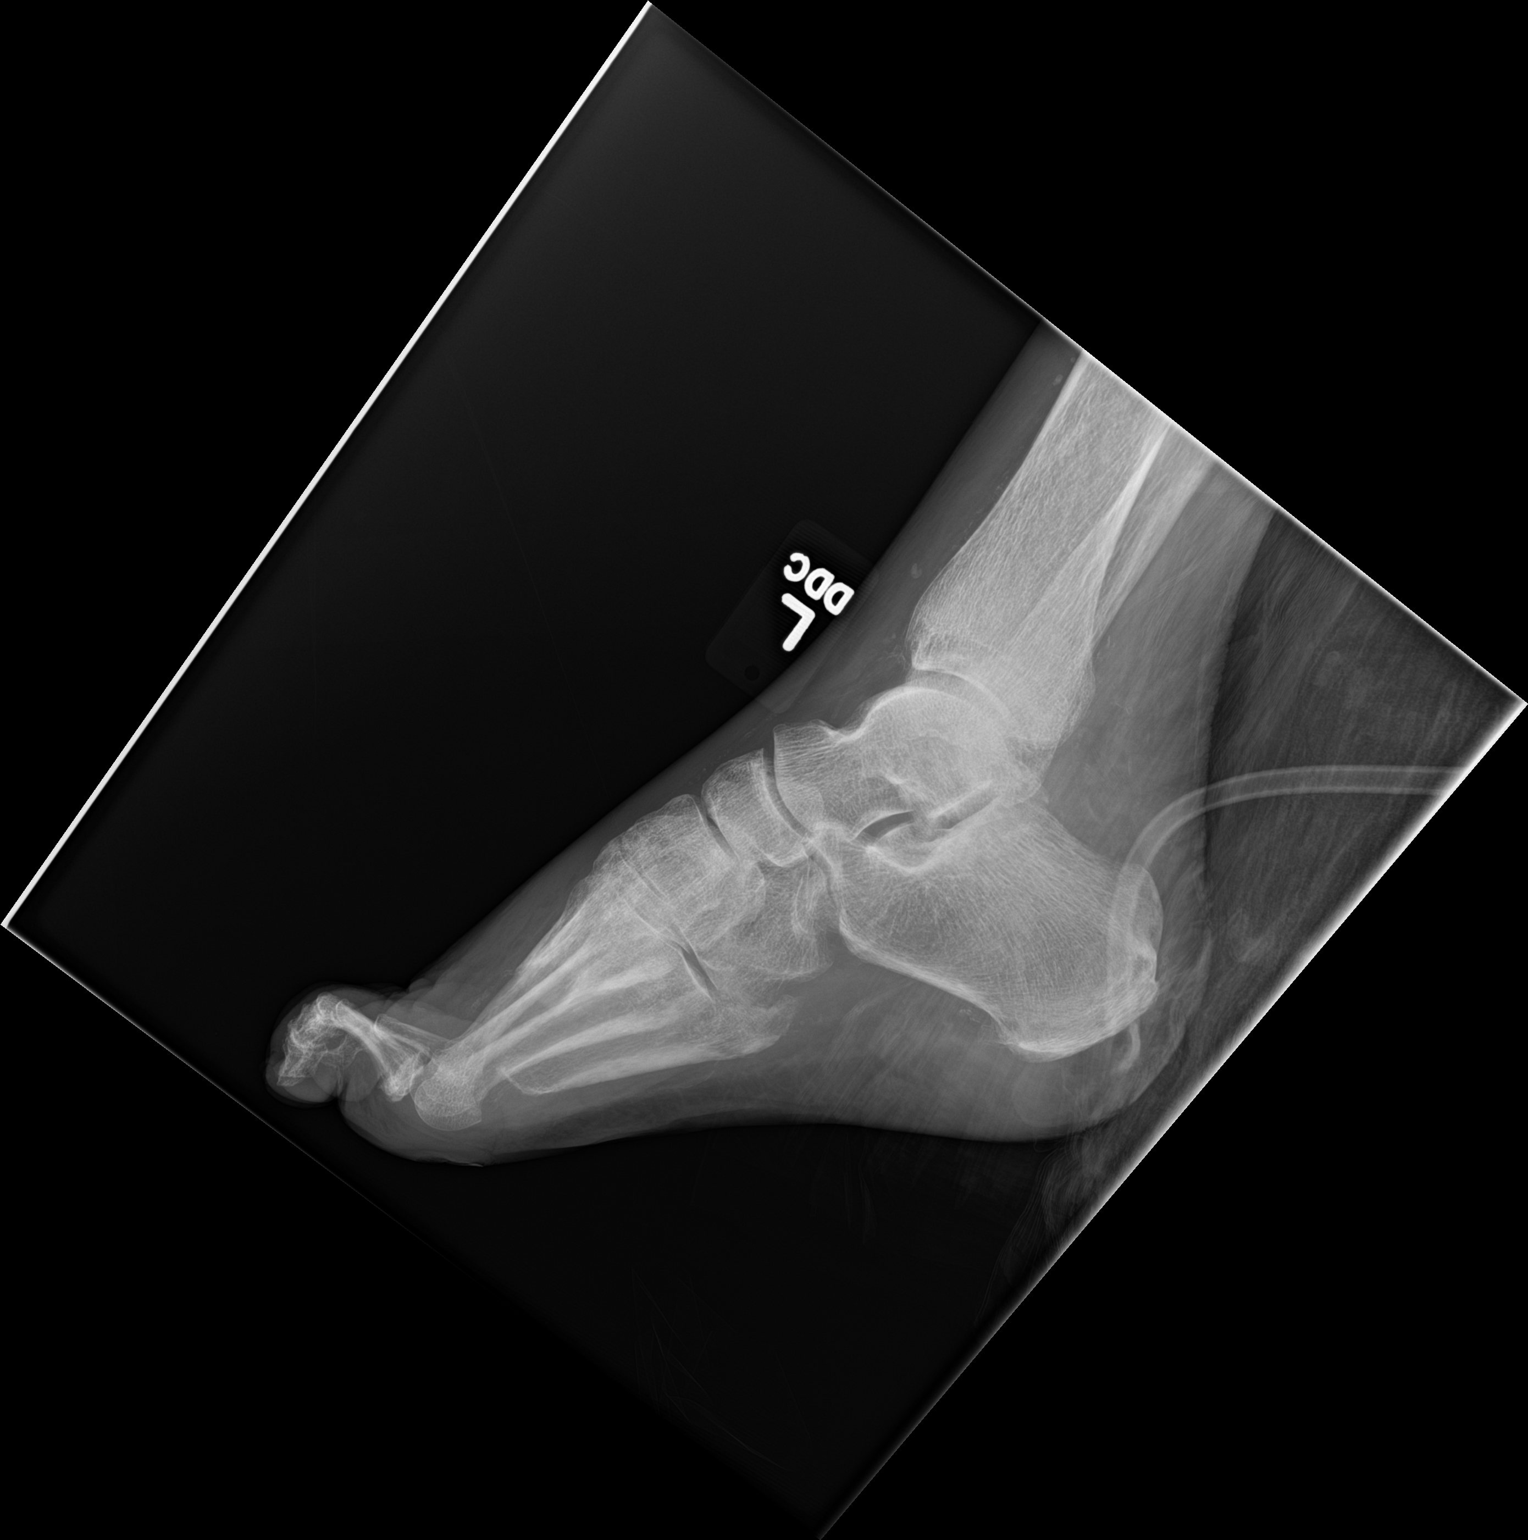

[2 of 2 positions shown; findings below may reference images not displayed]

FINDINGS: Interval transmetatarsal amputation to the first ray. Remote distal
fifth metatarsal resection. No generalized soft tissue emphysema.
Unremarkable appearance of the osteotomies. Osteopenia and
atherosclerosis.
IMPRESSION: Unremarkable appearance of interval first ray transmetatarsal
amputation. No indication of acute osteomyelitis.

## 2017-06-09 IMAGING — DX DG ANKLE 2V *L*
2 series · 2 of 2 positions shown · non-contrast
Comparison: 07/22/2015

CLINICAL DATA: Evaluate for osteomyelitis.  Hypotension

EXAM:
LEFT ANKLE - 2 VIEW

[ankle ap]
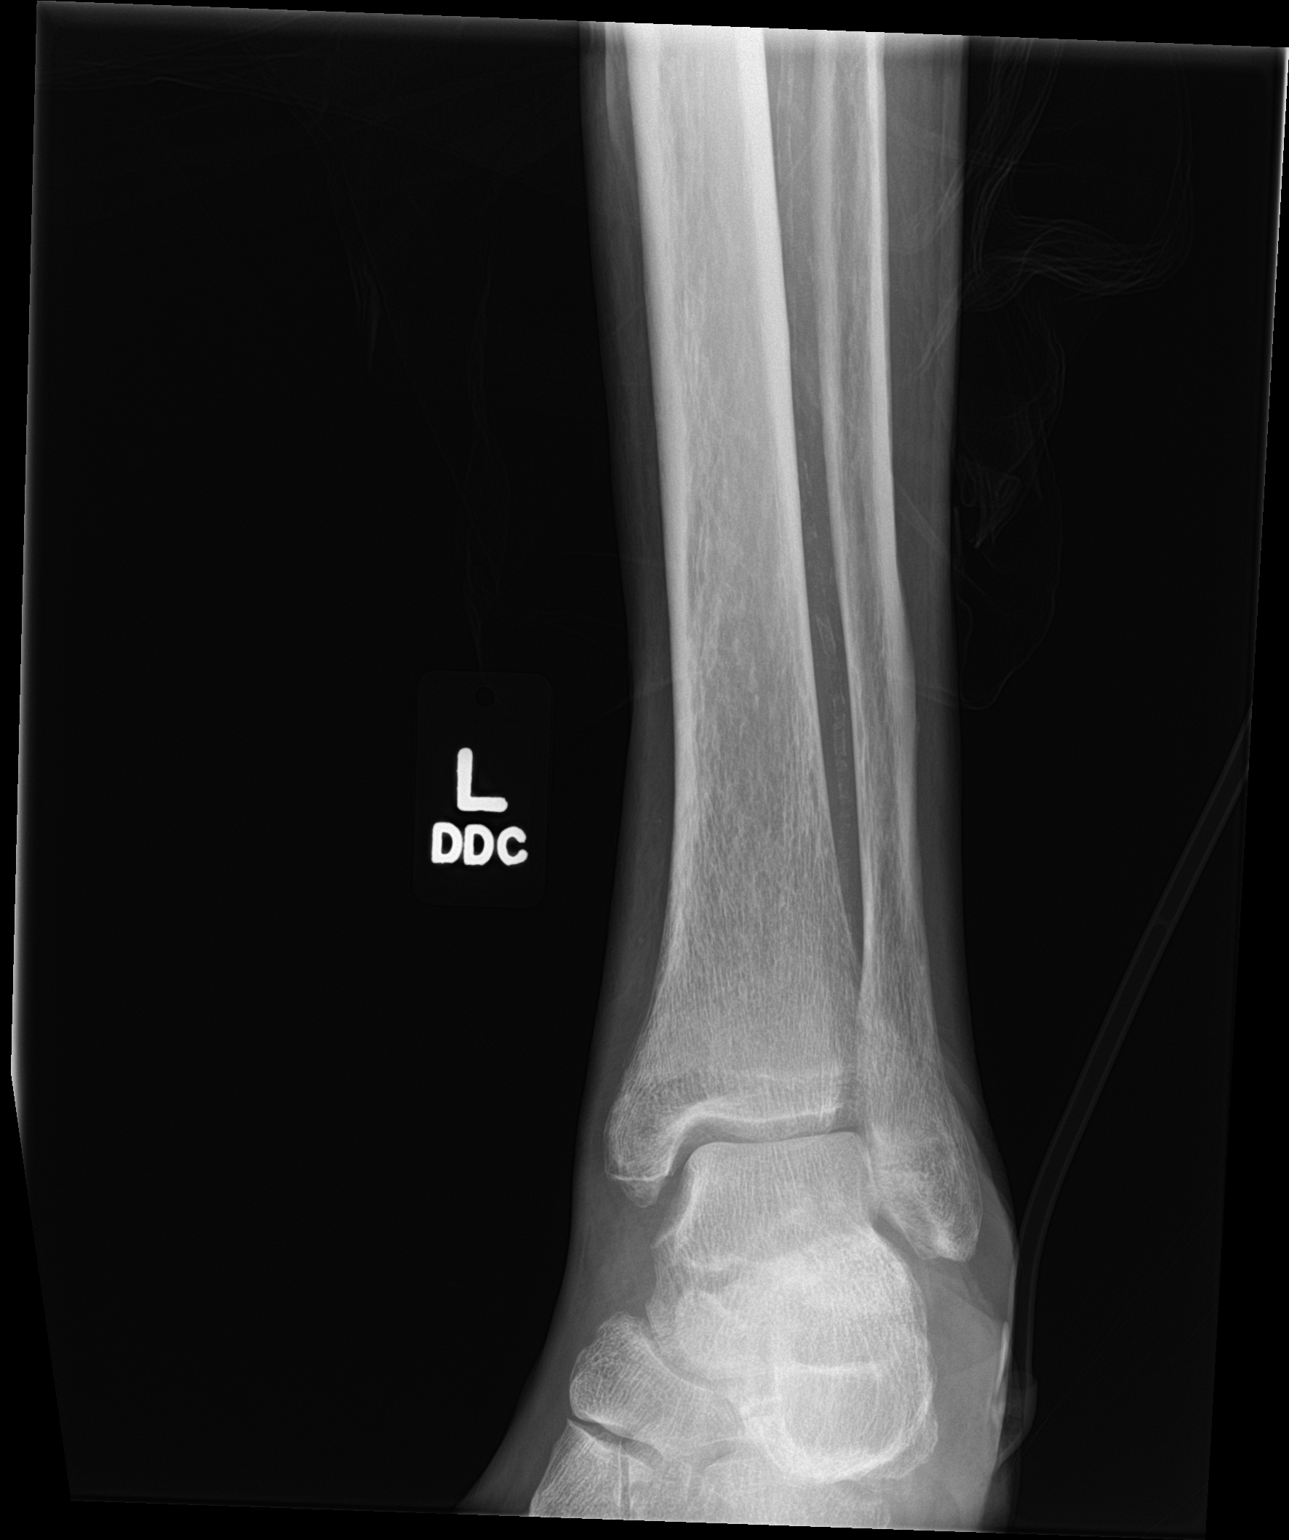

[ankle lat]
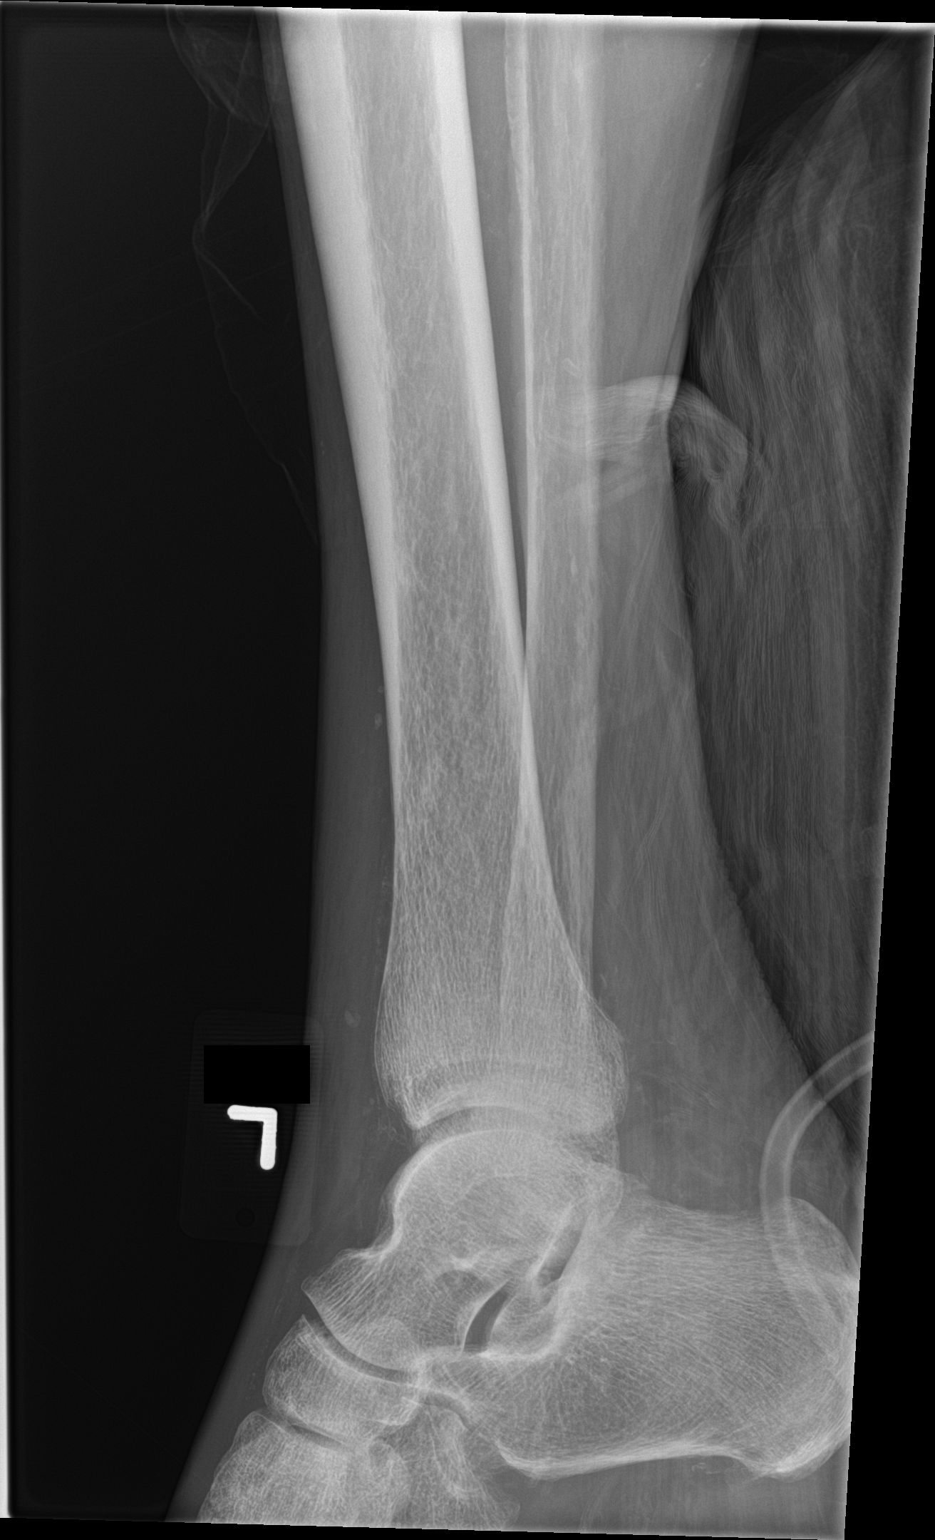

[2 of 2 positions shown; findings below may reference images not displayed]

FINDINGS: Wound VAC along the lateral ankle. No soft tissue emphysema or signs
of acute osteomyelitis. Negative for ankle joint effusion.

Osteopenia and atherosclerosis.
IMPRESSION: Negative for osteomyelitis or soft tissue emphysema.

## 2017-06-09 IMAGING — CT CT ABD-PELV W/O CM
2 of 5 series · 15 of 46 positions shown, 17 images · non-contrast
Comparison: 07/28/2009

CLINICAL DATA: Hypotension and slurred speech with nausea and
fusion. Recent diagnosis of esophageal tear.

EXAM:
CT ABDOMEN AND PELVIS WITHOUT CONTRAST
TECHNIQUE: Multidetector CT imaging of the abdomen and pelvis was performed
following the standard protocol without IV contrast.

[Series 2: routine abd pel wo · axial · 0.74mm/px · z∈[-619,-154]mm · 12 of 105 slices shown, 14 images]
[im 6/105  soft-tissue]
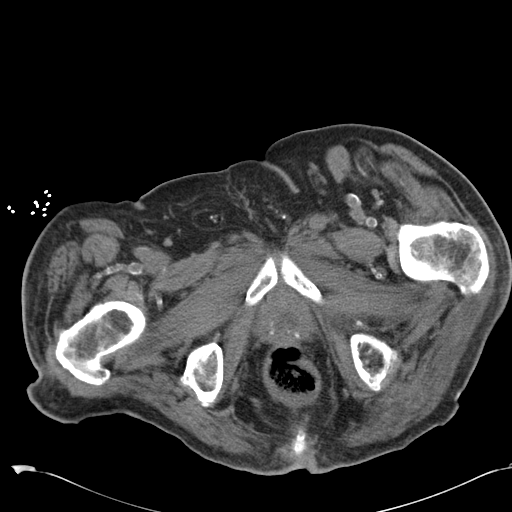
[im 6/105  bone]
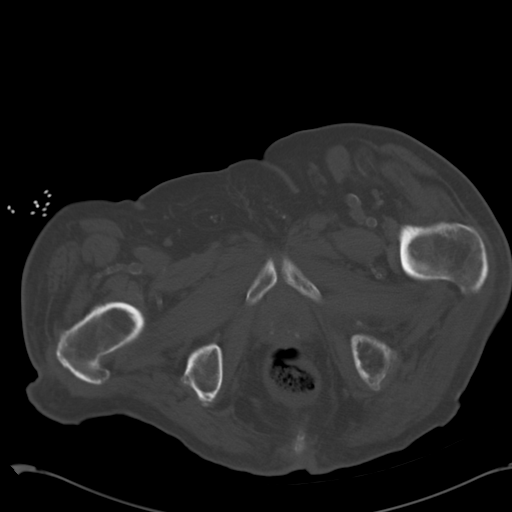
[im 16/105  soft-tissue]
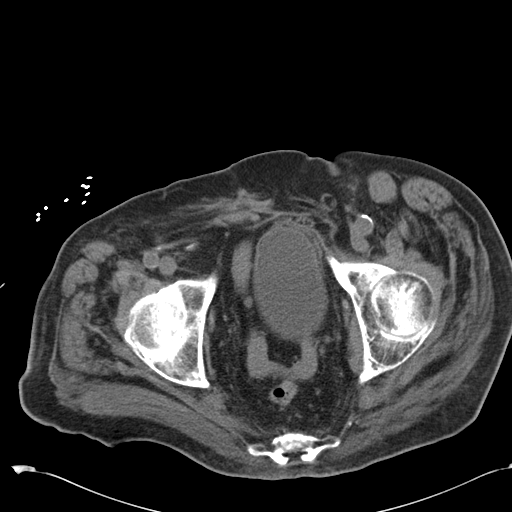
[im 21/105  soft-tissue]
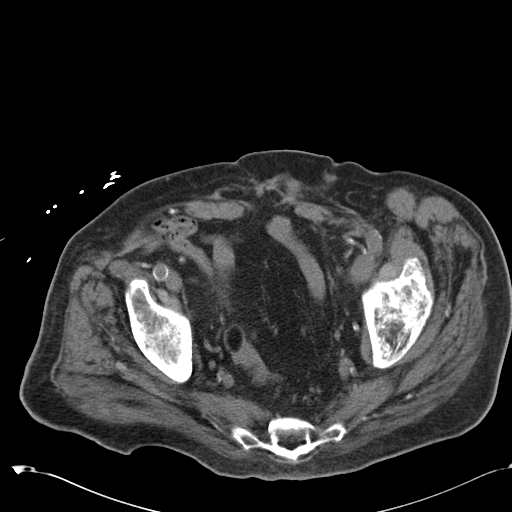
[im 32/105  soft-tissue]
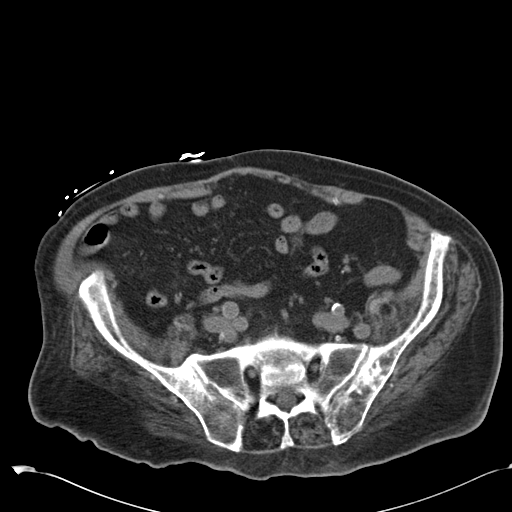
[im 42/105  soft-tissue]
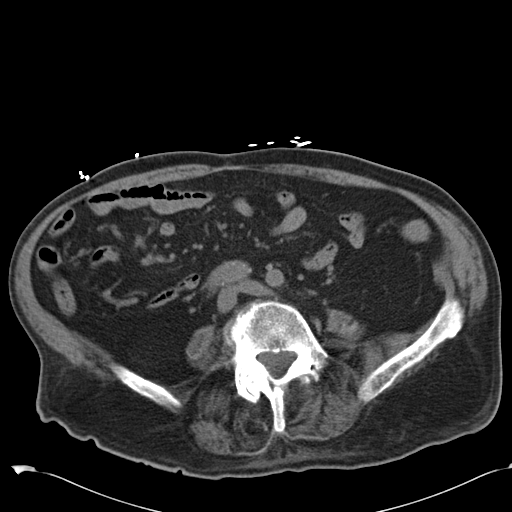
[im 47/105  soft-tissue]
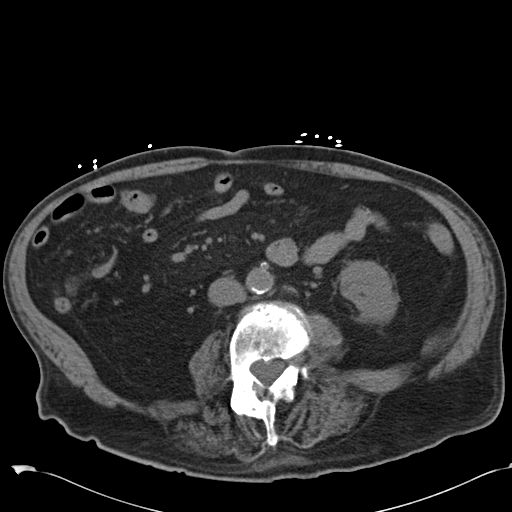
[im 58/105  soft-tissue]
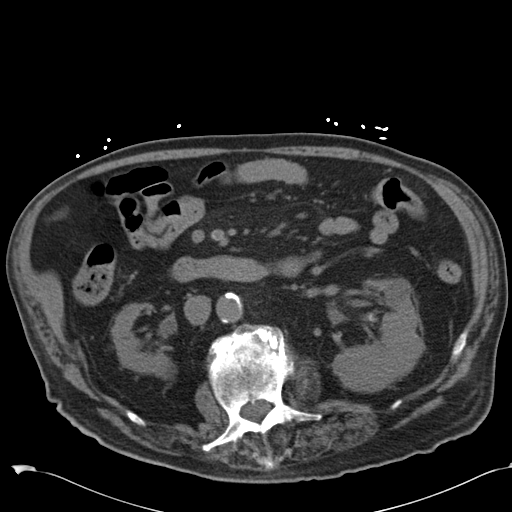
[im 63/105  soft-tissue]
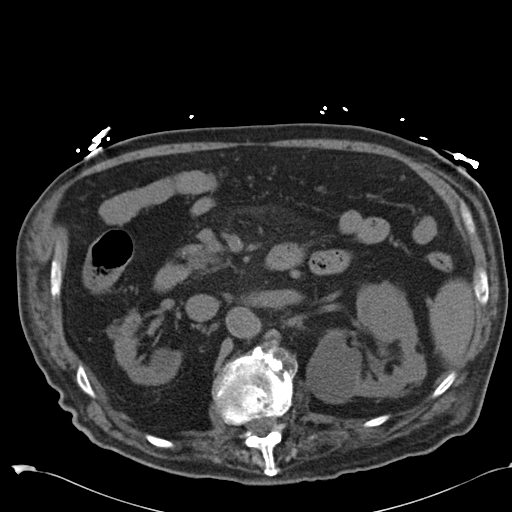
[im 73/105  soft-tissue]
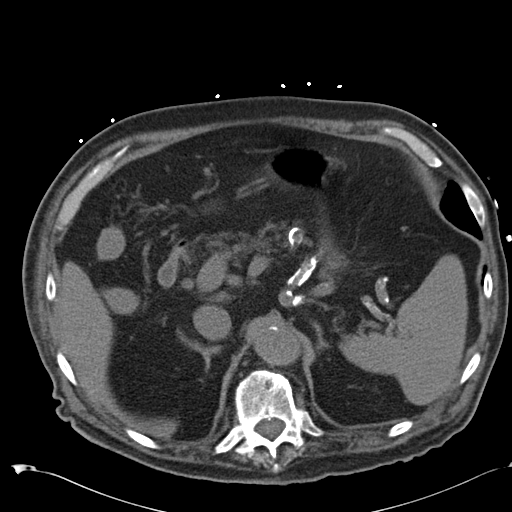
[im 73/105  bone]
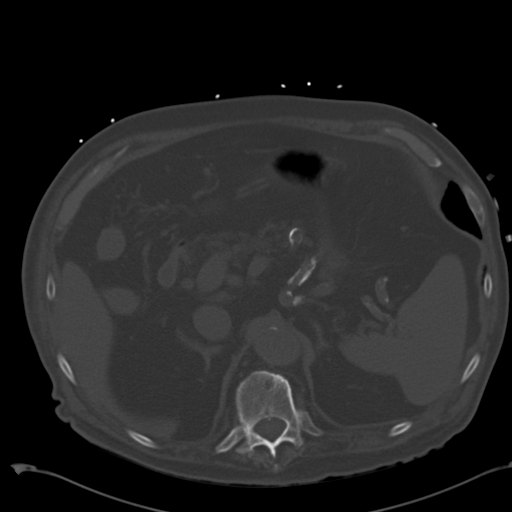
[im 84/105  soft-tissue]
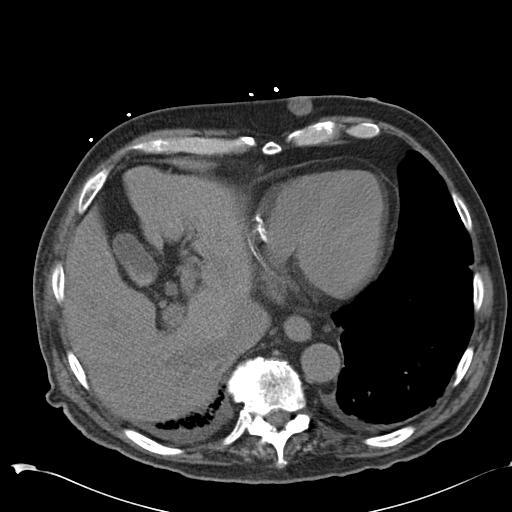
[im 89/105  soft-tissue]
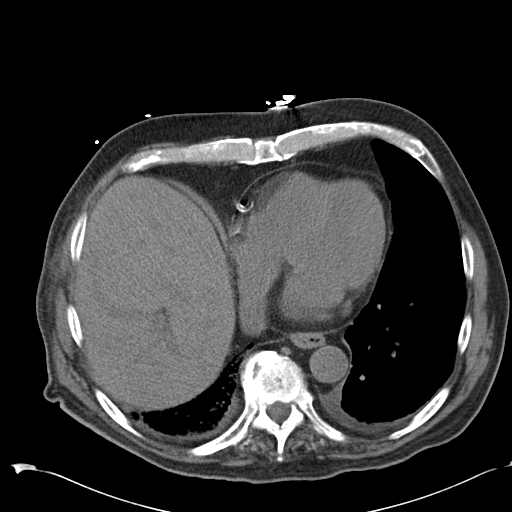
[im 99/105  soft-tissue]
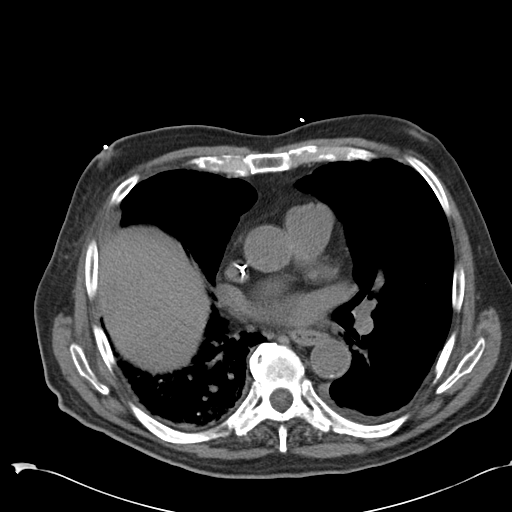

[Series 7: cor routine abd pel wo · coronal · 0.71mm/px · 3 of 150 slices shown]
[im 50/150  soft-tissue]
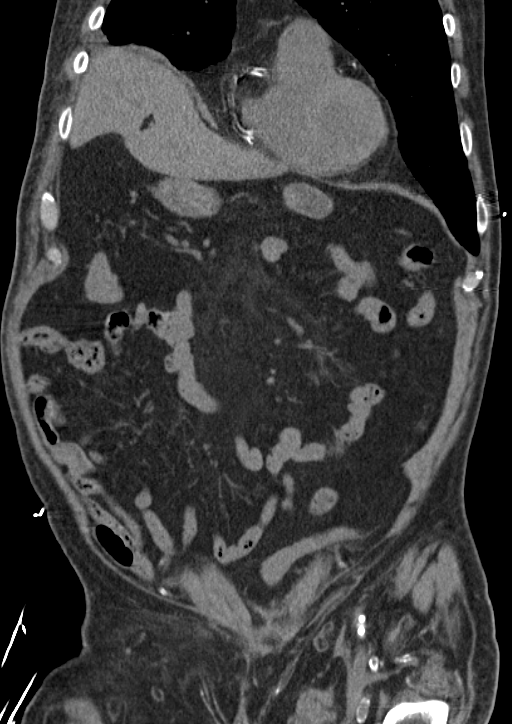
[im 67/150  soft-tissue]
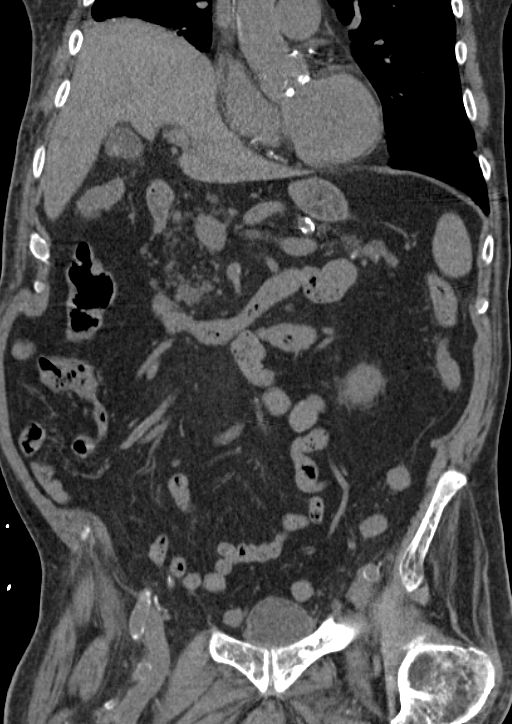
[im 83/150  soft-tissue]
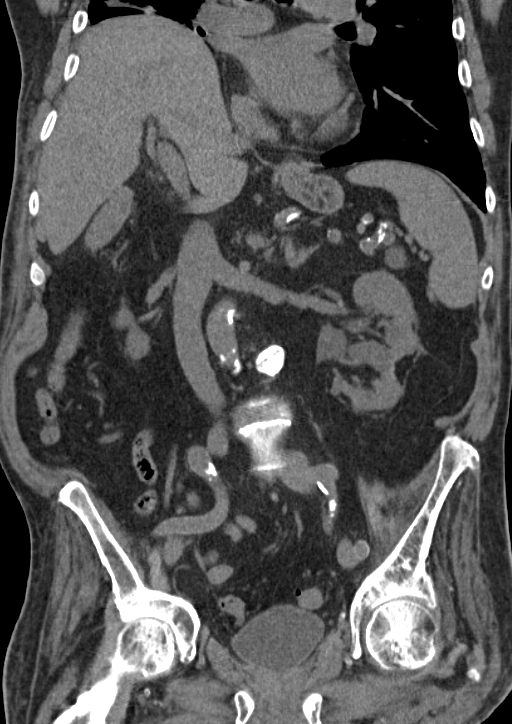

[15 of 46 positions shown; findings below may reference images not displayed]

FINDINGS: Lower chest: Right lower lobe interstitial and airspace disease
raises suspicion of pneumonia. Small bilateral pleural effusions are
evident.

Hepatobiliary: No focal abnormality in the liver on this study
without intravenous contrast. No evidence of hepatomegaly. Multiple
calcified stones are seen in the gallbladder. No intrahepatic or
extrahepatic biliary dilation.

Pancreas: Pancreas is diffusely fatty replaced. Multi a lobular
cystic lesion in the tail the pancreas has progressed in the
interval.

Spleen: No splenomegaly. No focal mass lesion.

Adrenals/Urinary Tract: No adrenal nodule or mass. Right kidney is
atrophic with multiple low-density lesions compatible with cysts.
Multiple cysts are noted in the left kidney with lower pole
scarring. No hydroureteronephrosis. The urinary bladder appears
normal for the degree of distention.

Stomach/Bowel: Stomach is nondistended. No gastric wall thickening.
No evidence of outlet obstruction. Duodenum is normally positioned
as is the ligament of Treitz. No small bowel wall thickening. No
small bowel dilatation. The terminal ileum is normal. Diverticular
changes are noted in the left colon without evidence of
diverticulitis.

Vascular/Lymphatic: There is abdominal aortic atherosclerosis
without aneurysm. There is no gastrohepatic or hepatoduodenal
ligament lymphadenopathy. No intraperitoneal or retroperitoneal
lymphadenopathy. No pelvic sidewall lymphadenopathy.

Reproductive: Prostate gland appears enlarged with probable TURP
defect.

Other: No intraperitoneal free fluid.

Musculoskeletal: Stable appearance of trabecular coarsening in the
left hemipelvis, likely a reflection of Paget's disease.
IMPRESSION: 1. Right lower lobe interstitial and airspace disease suggests
pneumonia.
2. Tiny bilateral pleural effusions.
3. Right renal atrophy with incompletely characterized cystic
lesions in the kidneys bilaterally. No hydronephrosis.
4. Multi lobular cystic lesion in the tail the pancreas measuring up
to 4.7 x 1.6 cm in total. This has progressed slightly since
07/28/2009.
